# Patient Record
Sex: Male | Born: 1950 | Race: White | Hispanic: No | Marital: Married | Smoking: Never smoker
Health system: Southern US, Community
[De-identification: ages and names within clinical notes are randomized; demographics above are authoritative.]

## PROBLEM LIST (undated history)

## (undated) DIAGNOSIS — I2510 Atherosclerotic heart disease of native coronary artery without angina pectoris: Secondary | ICD-10-CM

## (undated) DIAGNOSIS — G709 Myoneural disorder, unspecified: Secondary | ICD-10-CM

## (undated) DIAGNOSIS — J189 Pneumonia, unspecified organism: Secondary | ICD-10-CM

## (undated) DIAGNOSIS — R001 Bradycardia, unspecified: Secondary | ICD-10-CM

## (undated) DIAGNOSIS — R519 Headache, unspecified: Secondary | ICD-10-CM

## (undated) DIAGNOSIS — N200 Calculus of kidney: Secondary | ICD-10-CM

## (undated) DIAGNOSIS — G8929 Other chronic pain: Secondary | ICD-10-CM

## (undated) DIAGNOSIS — R51 Headache: Secondary | ICD-10-CM

## (undated) DIAGNOSIS — N400 Enlarged prostate without lower urinary tract symptoms: Secondary | ICD-10-CM

## (undated) DIAGNOSIS — G7289 Other specified myopathies: Secondary | ICD-10-CM

## (undated) DIAGNOSIS — I714 Abdominal aortic aneurysm, without rupture, unspecified: Secondary | ICD-10-CM

## (undated) DIAGNOSIS — M549 Dorsalgia, unspecified: Secondary | ICD-10-CM

## (undated) DIAGNOSIS — E119 Type 2 diabetes mellitus without complications: Secondary | ICD-10-CM

## (undated) DIAGNOSIS — I739 Peripheral vascular disease, unspecified: Secondary | ICD-10-CM

## (undated) DIAGNOSIS — E785 Hyperlipidemia, unspecified: Secondary | ICD-10-CM

## (undated) HISTORY — DX: Abdominal aortic aneurysm, without rupture, unspecified: I71.40

## (undated) HISTORY — PX: ANTERIOR CERVICAL DECOMP/DISCECTOMY FUSION: SHX1161

## (undated) HISTORY — PX: BACK SURGERY: SHX140

## (undated) HISTORY — DX: Peripheral vascular disease, unspecified: I73.9

## (undated) HISTORY — PX: SPINE SURGERY: SHX786

## (undated) HISTORY — DX: Hyperlipidemia, unspecified: E78.5

---
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
## 1982-10-11 HISTORY — PX: LUMBAR DISC SURGERY: SHX700

## 2011-12-16 DIAGNOSIS — H109 Unspecified conjunctivitis: Secondary | ICD-10-CM | POA: Diagnosis not present

## 2011-12-24 DIAGNOSIS — H109 Unspecified conjunctivitis: Secondary | ICD-10-CM | POA: Diagnosis not present

## 2012-12-01 DIAGNOSIS — H00029 Hordeolum internum unspecified eye, unspecified eyelid: Secondary | ICD-10-CM | POA: Diagnosis not present

## 2014-03-13 ENCOUNTER — Other Ambulatory Visit: Payer: Self-pay

## 2014-03-13 ENCOUNTER — Telehealth: Payer: Self-pay

## 2014-03-13 DIAGNOSIS — Z1211 Encounter for screening for malignant neoplasm of colon: Secondary | ICD-10-CM

## 2014-03-13 NOTE — Telephone Encounter (Signed)
Pt's wife called to get pt scheduled for screening colonoscopy. His last one was 10 plus years ago in [place] and was normal.  Dr. Oneida Alar did pt's wife Olin Hauser ) about a year ago.

## 2014-03-13 NOTE — Telephone Encounter (Signed)
Gastroenterology Pre-Procedure Review  Request Date: 03/13/2014 Requesting Physician: Pt's wife called to get him scheduled for his next one  PATIENT REVIEW QUESTIONS: The patient responded to the following health history questions as indicated:    1. Diabetes Melitis: no 2. Joint replacements in the past 12 months: no 3. Major health problems in the past 3 months: no 4. Has an artificial valve or MVP: no 5. Has a defibrillator: no 6. Has been advised in past to take antibiotics in advance of a procedure like teeth cleaning: no    MEDICATIONS & ALLERGIES:    Patient reports the following regarding taking any blood thinners:   Plavix? no Aspirin? no Coumadin? no  Patient confirms/reports the following medications:  No current outpatient prescriptions on file.   No current facility-administered medications for this visit.    Patient confirms/reports the following allergies:  Allergies  Allergen Reactions  . Penicillins     NOT SURE WHAT REACTION/ HAPPENED AS A CHILD    No orders of the defined types were placed in this encounter.    AUTHORIZATION INFORMATION Primary Insurance:   ID #:   Group #:  Pre-Cert / Auth required: Pre-Cert / Auth #:   Secondary Insurance:   ID #:   Group #:  Pre-Cert / Auth required: Pre-Cert / Auth #:   SCHEDULE INFORMATION: Procedure has been scheduled as follows:  Date:04/05/2014               Time:  10:30 AM Location: [facility] Short Stay  This Gastroenterology Pre-Precedure Review Form is being routed to the following provider(s): Barney Drain, MD

## 2014-03-13 NOTE — Telephone Encounter (Signed)
PREPOPIK-DRINK WATER TO KEEP URINE LIGHT YELLOW.  PT SHOULD DROP OFF RX 3 DAYS PRIOR TO PROCEDURE.  

## 2014-03-15 MED ORDER — SOD PICOSULFATE-MAG OX-CIT ACD 10-3.5-12 MG-GM-GM PO PACK: 1.0000 | PACK | ORAL | Status: DC

## 2014-03-15 NOTE — Telephone Encounter (Signed)
Rx sent to the pharmacy and instructions mailed to pt.  

## 2014-03-27 ENCOUNTER — Encounter (HOSPITAL_COMMUNITY): Payer: Self-pay | Admitting: Pharmacy Technician

## 2014-04-05 ENCOUNTER — Ambulatory Visit (HOSPITAL_COMMUNITY)
Admission: RE | Admit: 2014-04-05 | Discharge: 2014-04-05 | Disposition: A | Discharge status: Home / Self Care | Payer: Medicare Other | Source: Ambulatory Visit | Attending: Gastroenterology | Admitting: Gastroenterology

## 2014-04-05 ENCOUNTER — Encounter (HOSPITAL_COMMUNITY): Payer: Self-pay | Admitting: *Deleted

## 2014-04-05 ENCOUNTER — Encounter (HOSPITAL_COMMUNITY)
Admission: RE | Disposition: A | Discharge status: Home / Self Care | Payer: Self-pay | Source: Ambulatory Visit | Attending: Gastroenterology

## 2014-04-05 DIAGNOSIS — D126 Benign neoplasm of colon, unspecified: Secondary | ICD-10-CM | POA: Insufficient documentation

## 2014-04-05 DIAGNOSIS — Z1211 Encounter for screening for malignant neoplasm of colon: Secondary | ICD-10-CM | POA: Diagnosis not present

## 2014-04-05 DIAGNOSIS — K648 Other hemorrhoids: Secondary | ICD-10-CM | POA: Insufficient documentation

## 2014-04-05 HISTORY — DX: Bradycardia, unspecified: R00.1

## 2014-04-05 HISTORY — PX: COLONOSCOPY: SHX5424

## 2014-04-05 LAB — HM COLONOSCOPY: HM Colonoscopy: NORMAL

## 2014-04-05 SURGERY — COLONOSCOPY
Anesthesia: Moderate Sedation

## 2014-04-05 MED ORDER — MIDAZOLAM HCL 5 MG/5ML IJ SOLN
INTRAMUSCULAR | Status: AC
  Filled 2014-04-05: qty 10

## 2014-04-05 MED ORDER — SODIUM CHLORIDE 0.9 % IV SOLN
INTRAVENOUS | Status: DC
  Administered 2014-04-05: 10:00:00 via INTRAVENOUS

## 2014-04-05 MED ORDER — MEPERIDINE HCL 100 MG/ML IJ SOLN
INTRAMUSCULAR | Status: DC | PRN
  Administered 2014-04-05: 25 mg via INTRAVENOUS

## 2014-04-05 MED ORDER — ATROPINE SULFATE 1 MG/ML IJ SOLN
INTRAMUSCULAR | Status: AC
  Filled 2014-04-05: qty 1

## 2014-04-05 MED ORDER — MIDAZOLAM HCL 5 MG/5ML IJ SOLN
INTRAMUSCULAR | Status: DC | PRN
  Administered 2014-04-05 (×2): 2 mg via INTRAVENOUS

## 2014-04-05 MED ORDER — ATROPINE SULFATE 1 MG/ML IJ SOLN
INTRAMUSCULAR | Status: DC | PRN
  Administered 2014-04-05 (×2): .5 mg via INTRAVENOUS

## 2014-04-05 MED ORDER — STERILE WATER FOR IRRIGATION IR SOLN
Status: DC | PRN
  Administered 2014-04-05: 11:00:00

## 2014-04-05 MED ORDER — MEPERIDINE HCL 100 MG/ML IJ SOLN
INTRAMUSCULAR | Status: AC
  Filled 2014-04-05: qty 2

## 2014-04-05 NOTE — Op Note (Signed)
[facility] [address] [place], [address]   COLONOSCOPY PROCEDURE REPORT  PATIENT: Stanley Boyd, Stanley Boyd  MR#: [id number] BIRTHDATE: [birth year] , 62  yrs. old GENDER: Male ENDOSCOPIST: Barney Drain, MD REFERRED BY: PROCEDURE DATE:  04/05/2014 PROCEDURE:   Colonoscopy with snare polypectomy INDICATIONS:Average risk patient for colon cancer.  LAST  TCS 10 YRS AGO: NO POLYPS MEDICATIONS: Demerol 50 mg IV, Versed, Versed 4 mg IV, and Atropine 1 mg IV  DESCRIPTION OF PROCEDURE:    Physical exam was performed.  Informed consent was obtained from the patient after explaining the benefits, risks, and alternatives to procedure.  The patient was connected to monitor and placed in left lateral position. Continuous oxygen was provided by nasal cannula and IV medicine administered through an indwelling cannula.  After administration of sedation and rectal exam, the patients rectum was intubated and the EC-3890Li ([id number])  colonoscope was advanced under direct visualization to the cecum.  The scope was removed slowly by carefully examining the color, texture, anatomy, and integrity mucosa on the way out.  The patient was recovered in endoscopy and discharged home in satisfactory condition.    COLON FINDINGS: A sessile polyp measuring 6 mm in size was found at the hepatic flexure.  A polypectomy was performed using snare cautery.  , The colon mucosa was otherwise normal.  , and Small internal hemorrhoids were found.  PREP QUALITY: excellent. CECAL W/D TIME: 12 minutes  COMPLICATIONS: HR initially 49 and decreased to 35. ATROPINE 0. 5MG  IVx2 GIVEN. HR REMAINED > 55.  ENDOSCOPIC IMPRESSION: 1.   ONE COLON POLYP REMOVED 2.   Small internal hemorrhoids  RECOMMENDATIONS: Next colonoscopy in 5-10 years. FOLLOW A HIGH FIBER DIET. BIOPSY RESULTS SHOULD BE BACK IN 7-10 DAYS.       _______________________________ Lorrin MaisBarney Drain, MD 04/05/2014 4:58  PM

## 2014-04-05 NOTE — Discharge Instructions (Signed)
I gave you atropine to keep your heart rate up. You had 1 small polyp removed. You have internal hemorrhoids.   Next colonoscopy in 5-10 years.  FOLLOW A HIGH FIBER DIET. SEE INFO BELOW.  YOUR BIOPSY RESULTS SHOULD BE BACK IN 7-10 DAYS.  Colonoscopy Care After Read the instructions outlined below and refer to this sheet in the next week. These discharge instructions provide you with general information on caring for yourself after you leave the hospital. While your treatment has been planned according to the most current medical practices available, unavoidable complications occasionally occur. If you have any problems or questions after discharge, call DR. Aum Caggiano, [phone].  ACTIVITY  You may resume your regular activity, but move at a slower pace for the next 24 hours.   Take frequent rest periods for the next 24 hours.   Walking will help get rid of the air and reduce the bloated feeling in your belly (abdomen).   No driving for 24 hours (because of the medicine (anesthesia) used during the test).   You may shower.   Do not sign any important legal documents or operate any machinery for 24 hours (because of the anesthesia used during the test).    NUTRITION  Drink plenty of fluids.   You may resume your normal diet as instructed by your doctor.   Begin with a light meal and progress to your normal diet. Heavy or fried foods are harder to digest and may make you feel sick to your stomach (nauseated).   Avoid alcoholic beverages for 24 hours or as instructed.    MEDICATIONS  You may resume your normal medications.   WHAT YOU CAN EXPECT TODAY  Some feelings of bloating in the abdomen.   Passage of more gas than usual.   Spotting of blood in your stool or on the toilet paper  .  IF YOU HAD POLYPS REMOVED DURING THE COLONOSCOPY:  Eat a soft diet IF YOU HAVE NAUSEA, BLOATING, ABDOMINAL PAIN, OR VOMITING.    FINDING OUT THE RESULTS OF YOUR TEST Not all test  results are available during your visit. DR. Oneida Alar WILL CALL YOU WITHIN 7 DAYS OF YOUR PROCEDUE WITH YOUR RESULTS. Do not assume everything is normal if you have not heard from DR. Clydene Burack IN ONE WEEK, CALL HER OFFICE AT [phone].  SEEK IMMEDIATE MEDICAL ATTENTION AND CALL THE OFFICE: [phone] IF:  You have more than a spotting of blood in your stool.   Your belly is swollen (abdominal distention).   You are nauseated or vomiting.   You have a temperature over 101F.   You have abdominal pain or discomfort that is severe or gets worse throughout the day.   High-Fiber Diet A high-fiber diet changes your normal diet to include more whole grains, legumes, fruits, and vegetables. Changes in the diet involve replacing refined carbohydrates with unrefined foods. The calorie level of the diet is essentially unchanged. The Dietary Reference Intake (recommended amount) for adult males is 38 grams per day. For adult females, it is 25 grams per day. Pregnant and lactating women should consume 28 grams of fiber per day. Fiber is the intact part of a plant that is not broken down during digestion. Functional fiber is fiber that has been isolated from the plant to provide a beneficial effect in the body. PURPOSE  Increase stool bulk.   Ease and regulate bowel movements.   Lower cholesterol.  INDICATIONS THAT YOU NEED MORE FIBER  Constipation and hemorrhoids.  Uncomplicated diverticulosis (intestine condition) and irritable bowel syndrome.   Weight management.   As a protective measure against hardening of the arteries (atherosclerosis), diabetes, and cancer.   GUIDELINES FOR INCREASING FIBER IN THE DIET  Start adding fiber to the diet slowly. A gradual increase of about 5 more grams (2 slices of whole-wheat bread, 2 servings of most fruits or vegetables, or 1 bowl of high-fiber cereal) per day is best. Too rapid an increase in fiber may result in constipation, flatulence, and bloating.    Drink enough water and fluids to keep your urine clear or pale yellow. Water, juice, or caffeine-free drinks are recommended. Not drinking enough fluid may cause constipation.   Eat a variety of high-fiber foods rather than one type of fiber.   Try to increase your intake of fiber through using high-fiber foods rather than fiber pills or supplements that contain small amounts of fiber.   The goal is to change the types of food eaten. Do not supplement your present diet with high-fiber foods, but replace foods in your present diet.  INCLUDE A VARIETY OF FIBER SOURCES  Replace refined and processed grains with whole grains, canned fruits with fresh fruits, and incorporate other fiber sources. White rice, white breads, and most bakery goods contain little or no fiber.   Brown whole-grain rice, buckwheat oats, and many fruits and vegetables are all good sources of fiber. These include: broccoli, Brussels sprouts, cabbage, cauliflower, beets, sweet potatoes, white potatoes (skin on), carrots, tomatoes, eggplant, squash, berries, fresh fruits, and dried fruits.   Cereals appear to be the richest source of fiber. Cereal fiber is found in whole grains and bran. Bran is the fiber-rich outer coat of cereal grain, which is largely removed in refining. In whole-grain cereals, the bran remains. In breakfast cereals, the largest amount of fiber is found in those with "bran" in their names. The fiber content is sometimes indicated on the label.   You may need to include additional fruits and vegetables each day.   In baking, for 1 cup white flour, you may use the following substitutions:   1 cup whole-wheat flour minus 2 tablespoons.   1/2 cup white flour plus 1/2 cup whole-wheat flour.   Polyps, Colon  A polyp is extra tissue that grows inside your body. Colon polyps grow in the large intestine. The large intestine, also called the colon, is part of your digestive system. It is a long, hollow tube at  the end of your digestive tract where your body makes and stores stool. Most polyps are not dangerous. They are benign. This means they are not cancerous. But over time, some types of polyps can turn into cancer. Polyps that are smaller than a pea are usually not harmful. But larger polyps could someday become or may already be cancerous. To be safe, doctors remove all polyps and test them.   WHO GETS POLYPS? Anyone can get polyps, but certain people are more likely than others. You may have a greater chance of getting polyps if:  You are over 50.   You have had polyps before.   Someone in your family has had polyps.   Someone in your family has had cancer of the large intestine.   Find out if someone in your family has had polyps. You may also be more likely to get polyps if you:   Eat a lot of fatty foods   Smoke   Drink alcohol   Do not exercise  Eat too  much   TREATMENT  The caregiver will remove the polyp during sigmoidoscopy or colonoscopy.    PREVENTION There is not one sure way to prevent polyps. You might be able to lower your risk of getting them if you:  Eat more fruits and vegetables and less fatty food.   Do not smoke.   Avoid alcohol.   Exercise every day.   Lose weight if you are overweight.   Eating more calcium and folate can also lower your risk of getting polyps. Some foods that are rich in calcium are milk, cheese, and broccoli. Some foods that are rich in folate are chickpeas, kidney beans, and spinach.   Hemorrhoids Hemorrhoids are dilated (enlarged) veins around the rectum. Sometimes clots will form in the veins. This makes them swollen and painful. These are called thrombosed hemorrhoids. Causes of hemorrhoids include:  Constipation.   Straining to have a bowel movement.   HEAVY LIFTING HOME CARE INSTRUCTIONS  Eat a well balanced diet and drink 6 to 8 glasses of water every day to avoid constipation. You may also use a bulk laxative.    Avoid straining to have bowel movements.   Keep anal area dry and clean.   Do not use a donut shaped pillow or sit on the toilet for long periods. This increases blood pooling and pain.   Move your bowels when your body has the urge; this will require less straining and will decrease pain and pressure.

## 2014-04-05 NOTE — OR Nursing (Signed)
Pt heart rate started at 46 pre procedure, atropine given heart rate increased to 64

## 2014-04-05 NOTE — H&P (Signed)
  Primary Care Physician:  Pcp Not In System Primary Gastroenterologist:  Dr. Oneida Alar  Pre-Procedure History & Physical: HPI:  Stanley Boyd is a 63 y.o. male here for COLON CANCER SCREENING.  History reviewed. No pertinent past medical history.  Past Surgical History  Procedure Laterality Date  . Cervical disc surgery  first in  1986, last in 2000    x4, [facility]    Prior to Admission medications   Not on File    Allergies as of 03/13/2014 - Review Complete 03/13/2014  Allergen Reaction Noted  . Penicillins  03/13/2014    History reviewed. No pertinent family history.  History   Social History  . Marital Status: Married    Spouse Name: N/A    Number of Children: N/A  . Years of Education: N/A   Occupational History  . Not on file.   Social History Main Topics  . Smoking status: Never Smoker   . Smokeless tobacco: Not on file  . Alcohol Use: No  . Drug Use: No  . Sexual Activity: Not on file   Other Topics Concern  . Not on file   Social History Narrative  . No narrative on file    Review of Systems: See HPI, otherwise negative ROS   Physical Exam: BP 127/77  Pulse 46  Temp(Src) 97.7 F (36.5 C) (Oral)  Ht 5\' 9"  (1.753 m)  Wt 175 lb (79.379 kg)  BMI 25.83 kg/m2  SpO2 95% General:   Alert,  pleasant and cooperative in NAD Head:  Normocephalic and atraumatic. Neck:  Supple; Lungs:  Clear throughout to auscultation.    Heart:  Regular rate and rhythm. Abdomen:  Soft, nontender and nondistended. Normal bowel sounds, without guarding, and without rebound.   Neurologic:  Alert and  oriented x4;  grossly normal neurologically.  Impression/Plan:     SCREENING  Plan:  1. TCS TODAY

## 2014-04-08 ENCOUNTER — Encounter (HOSPITAL_COMMUNITY): Payer: Self-pay | Admitting: Gastroenterology

## 2014-04-22 DIAGNOSIS — R3911 Hesitancy of micturition: Secondary | ICD-10-CM | POA: Diagnosis not present

## 2014-04-24 ENCOUNTER — Telehealth: Payer: Self-pay | Admitting: Gastroenterology

## 2014-04-24 NOTE — Telephone Encounter (Signed)
Please call pt. He had A simple adenoma removed from hIS colon.    Next colonoscopy in 5-10 years.   FOLLOW A HIGH FIBER DIET.

## 2014-04-26 NOTE — Telephone Encounter (Signed)
Called and informed pt.  

## 2014-08-11 DIAGNOSIS — J189 Pneumonia, unspecified organism: Secondary | ICD-10-CM

## 2014-08-11 DIAGNOSIS — Z23 Encounter for immunization: Secondary | ICD-10-CM | POA: Diagnosis not present

## 2014-08-11 HISTORY — DX: Pneumonia, unspecified organism: J18.9

## 2014-08-31 DIAGNOSIS — E785 Hyperlipidemia, unspecified: Secondary | ICD-10-CM | POA: Diagnosis not present

## 2014-08-31 DIAGNOSIS — J159 Unspecified bacterial pneumonia: Secondary | ICD-10-CM | POA: Diagnosis not present

## 2014-08-31 DIAGNOSIS — I361 Nonrheumatic tricuspid (valve) insufficiency: Secondary | ICD-10-CM | POA: Diagnosis not present

## 2014-08-31 DIAGNOSIS — E876 Hypokalemia: Secondary | ICD-10-CM | POA: Diagnosis present

## 2014-08-31 DIAGNOSIS — R339 Retention of urine, unspecified: Secondary | ICD-10-CM | POA: Diagnosis not present

## 2014-08-31 DIAGNOSIS — R748 Abnormal levels of other serum enzymes: Secondary | ICD-10-CM | POA: Diagnosis not present

## 2014-08-31 DIAGNOSIS — J90 Pleural effusion, not elsewhere classified: Secondary | ICD-10-CM | POA: Diagnosis not present

## 2014-08-31 DIAGNOSIS — R0689 Other abnormalities of breathing: Secondary | ICD-10-CM | POA: Diagnosis not present

## 2014-08-31 DIAGNOSIS — I340 Nonrheumatic mitral (valve) insufficiency: Secondary | ICD-10-CM | POA: Diagnosis not present

## 2014-08-31 DIAGNOSIS — F411 Generalized anxiety disorder: Secondary | ICD-10-CM | POA: Diagnosis present

## 2014-08-31 DIAGNOSIS — R509 Fever, unspecified: Secondary | ICD-10-CM | POA: Diagnosis not present

## 2014-08-31 DIAGNOSIS — A419 Sepsis, unspecified organism: Secondary | ICD-10-CM | POA: Diagnosis not present

## 2014-08-31 DIAGNOSIS — J181 Lobar pneumonia, unspecified organism: Secondary | ICD-10-CM | POA: Diagnosis not present

## 2014-08-31 DIAGNOSIS — I517 Cardiomegaly: Secondary | ICD-10-CM | POA: Diagnosis not present

## 2014-08-31 DIAGNOSIS — R740 Nonspecific elevation of levels of transaminase and lactic acid dehydrogenase [LDH]: Secondary | ICD-10-CM | POA: Diagnosis not present

## 2014-08-31 DIAGNOSIS — J918 Pleural effusion in other conditions classified elsewhere: Secondary | ICD-10-CM | POA: Diagnosis not present

## 2014-08-31 DIAGNOSIS — E860 Dehydration: Secondary | ICD-10-CM | POA: Diagnosis present

## 2014-08-31 DIAGNOSIS — R410 Disorientation, unspecified: Secondary | ICD-10-CM | POA: Diagnosis present

## 2014-08-31 DIAGNOSIS — J189 Pneumonia, unspecified organism: Secondary | ICD-10-CM | POA: Diagnosis present

## 2014-08-31 DIAGNOSIS — R945 Abnormal results of liver function studies: Secondary | ICD-10-CM | POA: Diagnosis not present

## 2014-09-06 LAB — BASIC METABOLIC PANEL
BUN: 15 mg/dL (ref 4–21)
Creatinine: 0.9 mg/dL (ref <=1.3)
Glucose: 117 mg/dL
Potassium: 4.3 mmol/L (ref 3.4–5.3)
Sodium: 135 mmol/L — AB (ref 137–147)

## 2014-09-06 LAB — POCT INR: INR: 1.1 (ref <=1.1)

## 2014-09-06 LAB — LIPID PANEL
Cholesterol: 186 mg/dL (ref 0–200)
HDL: 26 mg/dL — AB (ref 35–70)
LDL Cholesterol: 130 mg/dL
LDl/HDL Ratio: 130
Triglycerides: 149 mg/dL (ref 40–160)

## 2014-09-06 LAB — HEPATIC FUNCTION PANEL
ALT: 82 U/L — AB (ref 10–40)
AST: 78 U/L — AB (ref 14–40)
Alkaline Phosphatase: 162 U/L — AB (ref 25–125)
Bilirubin, Total: 0.7 mg/dL

## 2014-09-06 LAB — PROTIME-INR: Protime: 12 seconds (ref 10.0–13.8)

## 2014-09-09 ENCOUNTER — Telehealth: Payer: Self-pay

## 2014-09-09 NOTE — Telephone Encounter (Signed)
Pt has been scheduled.  °

## 2014-09-09 NOTE — Telephone Encounter (Signed)
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 . Pt was dc'd from hospital on Friday and needs fup.  Relayed that you would get him in to see you. pls advise on when to schedule.

## 2014-09-09 NOTE — Telephone Encounter (Signed)
Lm on vm for tp to cb

## 2014-09-09 NOTE — Telephone Encounter (Signed)
May combine 2 15 minute visits within next 2 weeks to create space for visit. If there are 5 30 minute visits, there should only be 2 other spots. 8 visits per half day if there are 4 30 minute visits

## 2014-09-12 DIAGNOSIS — R339 Retention of urine, unspecified: Secondary | ICD-10-CM | POA: Diagnosis not present

## 2014-09-12 DIAGNOSIS — R351 Nocturia: Secondary | ICD-10-CM | POA: Diagnosis not present

## 2014-09-12 DIAGNOSIS — N319 Neuromuscular dysfunction of bladder, unspecified: Secondary | ICD-10-CM | POA: Diagnosis not present

## 2014-09-13 ENCOUNTER — Encounter (HOSPITAL_COMMUNITY): Payer: Self-pay

## 2014-09-13 ENCOUNTER — Emergency Department (HOSPITAL_COMMUNITY)
Admission: EM | Admit: 2014-09-13 | Discharge: 2014-09-13 | Disposition: A | Discharge status: Home / Self Care | Payer: Medicare Other | Attending: Emergency Medicine | Admitting: Emergency Medicine

## 2014-09-13 DIAGNOSIS — Z9889 Other specified postprocedural states: Secondary | ICD-10-CM | POA: Diagnosis not present

## 2014-09-13 DIAGNOSIS — Z880 Allergy status to penicillin: Secondary | ICD-10-CM | POA: Diagnosis not present

## 2014-09-13 DIAGNOSIS — Z792 Long term (current) use of antibiotics: Secondary | ICD-10-CM | POA: Diagnosis not present

## 2014-09-13 DIAGNOSIS — R338 Other retention of urine: Secondary | ICD-10-CM | POA: Diagnosis not present

## 2014-09-13 DIAGNOSIS — Z8701 Personal history of pneumonia (recurrent): Secondary | ICD-10-CM | POA: Diagnosis not present

## 2014-09-13 DIAGNOSIS — R339 Retention of urine, unspecified: Secondary | ICD-10-CM | POA: Diagnosis not present

## 2014-09-13 MED ORDER — TAMSULOSIN HCL 0.4 MG PO CAPS
0.4000 mg | ORAL_CAPSULE | Freq: Every day | ORAL | Status: DC
Start: 2014-09-13 — End: 2015-06-26

## 2014-09-13 NOTE — ED Notes (Signed)
Pt was discharge with 54F Foley Catheter,  Output bag changed to leg bag, pt teaching given for foley care. Pt unable to sign discharge due to downtime of Epic system.

## 2014-09-13 NOTE — Discharge Instructions (Signed)
Acute Urinary Retention °Acute urinary retention is the temporary inability to urinate. °This is a common problem in older men. As men age their prostates become larger and block the flow of urine from the bladder. This is usually a problem that has come on gradually.  °HOME CARE INSTRUCTIONS °If you are sent home with a Foley catheter and a drainage system, you will need to discuss the best course of action with your health care provider. While the catheter is in, maintain a good intake of fluids. Keep the drainage bag emptied and lower than your catheter. This is so that contaminated urine will not flow back into your bladder, which could lead to a urinary tract infection. °There are two main types of drainage bags. One is a large bag that usually is used at night. It has a good capacity that will allow you to sleep through the night without having to empty it. The second type is called a leg bag. It has a smaller capacity, so it needs to be emptied more frequently. However, the main advantage is that it can be attached by a leg strap and can go underneath your clothing, allowing you the freedom to move about or leave your home. °Only take over-the-counter or prescription medicines for pain, discomfort, or fever as directed by your health care provider.  °SEEK MEDICAL CARE IF: °· You develop a low-grade fever. °· You experience spasms or leakage of urine with the spasms. °SEEK IMMEDIATE MEDICAL CARE IF:  °· You develop chills or fever. °· Your catheter stops draining urine. °· Your catheter falls out. °· You start to develop increased bleeding that does not respond to rest and increased fluid intake. °MAKE SURE YOU: °· Understand these instructions. °· Will watch your condition. °· Will get help right away if you are not doing well or get worse. °Document Released: 01/03/2001 Document Revised: 10/02/2013 Document Reviewed: 03/08/2013 °Exit[employer]® Patient Information ©2015 [employer]. This information is not  intended to replace advice given to you by your health care provider. Make sure you discuss any questions you have with your health care provider. ° °

## 2014-09-13 NOTE — ED Notes (Signed)
Pt released from [facility] for Pnuemonia, states he had a urinary cath in place that was removed at discharge.  Pt states he has not voided since Midnight.

## 2014-09-13 NOTE — ED Provider Notes (Signed)
CSN: [id number]     Arrival date & time 09/13/14  0459 History   First MD Initiated Contact with Patient 09/13/14 [id number]     Chief Complaint - Urinary Retention    The history is provided by the patient and a significant other.  Patient presents with acute urinary retention It started over 5 hours ago and it is rapidly worsening.  Nothing improves his retention.  Nothing worsens his retention.  He reports associated abdominal pain.  He has not taken anything for his pain  Patient reports he was recently inpatient in [facility] for pneumonia.  He was discharged home with foley catheter due to urinary retention.  He saw urologist yesterday as outpatient and had foley removed.  He was not started on flomax. However during the night he noted had acute onset of retention  He is on 2 antibiotics for his recent pneumonia He does not take any meds for BPH  Past Medical History  Diagnosis Date  . Bradycardia    Past Surgical History  Procedure Laterality Date  . Cervical disc surgery  first in  1986, last in 2000    x4, [facility]  . Colonoscopy N/A 04/05/2014    Procedure: COLONOSCOPY;  Surgeon: Danie Binder, MD;  Location: [facility] ENDO SUITE;  Service: Endoscopy;  Laterality: N/A;  10:30 AM   No family history on file. History  Substance Use Topics  . Smoking status: Never Smoker   . Smokeless tobacco: Not on file  . Alcohol Use: No    Review of Systems  Constitutional: Negative for fever.  Genitourinary: Positive for difficulty urinating.  Musculoskeletal: Negative for back pain.  All other systems reviewed and are negative.     Allergies  Penicillins  Home Medications   Prior to Admission medications   Not on File   BP 129/89 mmHg  Pulse 77  Temp(Src) 97.9 F (36.6 C) (Oral)  Resp 20  Ht 5\' 9"  (1.753 m)  Wt 176 lb (79.833 kg)  BMI 25.98 kg/m2  SpO2 98% Physical Exam CONSTITUTIONAL: Well developed/well nourished, uncomfortable appearing HEAD:  Normocephalic/atraumatic EYES: EOMI ENMT: Mucous membranes moist NECK: supple no meningeal signs CV: S1/S2 noted, no murmurs/rubs/gallops noted LUNGS: Lungs are clear to auscultation bilaterally, no apparent distress ABDOMEN: soft, suprapubic tenderness noted, no rebound or guarding, bowel sounds noted throughout abdomen NEURO: Pt is awake/alert/appropriate, moves all extremitiesx4.  No facial droop.   EXTREMITIES: pulses normal/equal, full ROM SKIN: warm, color normal PSYCH: no abnormalities of mood noted, alert and oriented to situation  ED Course  Procedures  Labs Review Labs Reviewed  URINE CULTURE     MDM  5:40 AM Pt improving after foley placement by nursing Apparently he had foley removed yesterday by urology and now has recurrence of retention Will d/c home with foley leg bag and will give Rx for flomax and advised for him to call his urologist later this morning.  Will defer any new antibiotics until culture returns as he was just on two antibiotics after hospitalization 6:17 AM Pt feels improved He does not have any suprapubic tenderness at his time He would like to go home He will be discharged with catheter in place  He will call his urologist today Final diagnoses:  Acute urinary retention    Nursing notes including past medical history and social history reviewed and considered in documentation     Sharyon Cable, MD 09/13/14 [phone]

## 2014-09-15 LAB — URINE CULTURE
Colony Count: NO GROWTH
Culture: NO GROWTH
Special Requests: NORMAL

## 2014-09-18 ENCOUNTER — Encounter: Payer: Self-pay | Admitting: Family Medicine

## 2014-09-18 ENCOUNTER — Ambulatory Visit (INDEPENDENT_AMBULATORY_CARE_PROVIDER_SITE_OTHER): Payer: Medicare Other | Admitting: Family Medicine

## 2014-09-18 VITALS — BP 98/72 | HR 53 | Temp 97.5°F | Ht 69.0 in | Wt 185.0 lb

## 2014-09-18 DIAGNOSIS — R339 Retention of urine, unspecified: Secondary | ICD-10-CM

## 2014-09-18 DIAGNOSIS — G8190 Hemiplegia, unspecified affecting unspecified side: Secondary | ICD-10-CM

## 2014-09-18 DIAGNOSIS — J189 Pneumonia, unspecified organism: Secondary | ICD-10-CM | POA: Diagnosis not present

## 2014-09-18 DIAGNOSIS — E785 Hyperlipidemia, unspecified: Secondary | ICD-10-CM

## 2014-09-18 DIAGNOSIS — R740 Nonspecific elevation of levels of transaminase and lactic acid dehydrogenase [LDH]: Secondary | ICD-10-CM | POA: Diagnosis not present

## 2014-09-18 DIAGNOSIS — N400 Enlarged prostate without lower urinary tract symptoms: Secondary | ICD-10-CM | POA: Insufficient documentation

## 2014-09-18 DIAGNOSIS — R001 Bradycardia, unspecified: Secondary | ICD-10-CM | POA: Diagnosis not present

## 2014-09-18 DIAGNOSIS — R7401 Elevation of levels of liver transaminase levels: Secondary | ICD-10-CM

## 2014-09-18 NOTE — Assessment & Plan Note (Addendum)
Resolved after what appears to be a 14 day course of antibiotics (or at least 10 days of vanc/linezolid). During hospitalization, thought to be possible MRSA pneumonia post influenza but this is a community acquired pneumonia case and flu was negative (though appears to have been tested 2 days after hospitalized). Possible resistant strep pneumo case. Patient has pulmonology follow up with plans to repeat CT scan to follow dense consolidation (6 weeks after diagnosis). I have scanned in hospital report including CT scan after review today.   Follow up with me in 6 weeks or sooner if any recurrence of symptoms.

## 2014-09-18 NOTE — Patient Instructions (Addendum)
Records from Dr. Gladstone Lighter and [facility] gastroenterology You already requested from hospital.   Glad you have improved from this pneumonia. Keep using the albuterol as needed and hope its use continues to slow.   Follow up with urology-make sure they send [place] notes here.   Follow up with pulmonology- I can see their notes.   Obviously if you have fever, shortness of breath, worsening fatiuge, see me sooner but otherwise check in 6 weeks from now.   Get a morning appointment and I will review your records/labs to see if you need any further bloodwork.

## 2014-09-18 NOTE — Assessment & Plan Note (Addendum)
Foley catheter in place. Has follow up with urology. On flomax. I question whether there is some neurological damage as a result of his fall/injury many years ago as this seems to happen in periods of stress and patient also seems mildly hyperreflexic but this may simply just be post operative difficulty voiding.

## 2014-09-18 NOTE — Assessment & Plan Note (Signed)
Obtain records. Consider fasting labs at next visit to establish baseline next visit.

## 2014-09-18 NOTE — Progress Notes (Addendum)
Stanley Reddish, MD Phone: [phone]  Subjective:  Patient presents today to establish care and for hospital follow up and to establish care. Former physician was Dr. Gladstone Lighter (7-8 years ago). Chief complaint-noted.   Right sided Community Acquired Pneumonia  Per patient and discharge paperwork Hospitalized November 21st to the 27th at [facility] with community acquired pneumonia. Had hacking cough week before then low grade fever for 2-3 days before hospitalization. Fever Spiked on Saturday before hospitalization above 103. Was never short of breath.   Patient also completed of severe headaches and had negative head CT. Continued fever for 4 days in the hospital ( and then changed to vancomycin which broke the fever. Had a right sided parapnumonic effusion secondary to pneumonia. Unclear other medications used during hospitalization. Per discharge patient afebrile 48 hours before discharge. CAP improved clinically and radiographic. 6 week pulmonology follow up. Patient was later scheduled for planned follow up with [facility]. No bacteremia on cultures viewable on discharge papers. Day of discharge no leukocytosis, hgb 13.7, plat 290. Cr 0.9, sodium 135, glucose 117, albumin 3.1. Strep pna antigen negative. Urine legionaeela negative.   Family and patient deny being told he was septic. They state it is Normal for him to run close to 100/70 or slightly under. No lightheadedness with standing. HR normally runs in 40s. 50s actually high for him.   Also had urinary retention during hospitalization (day 3 of hospitalization stopped urinating). Had foley when discharged then went to urology on 12/4 and was removed but had urinary retention and had to go to ER. Foley placed again and flomax started. Urine culture negative in hospital. Has upcoming repeat urology follow up.   At home, patient has been doing albuterol 2-3 x  A day if hacking cough but has been improving.  Discharged on lenezolid for staph and levaquin. 7 days of antibiotics at home after already having 6-7 days in hospital. No trouble with stooling-normal bowel movements. After major surgery in 200, had difficulty with urinary retention and difficulty controlling stools. Reports having already had flu shot this year.   ROS- denies shortness of breath, still with occasional hacking cough. No fevers/chills. Appetite increasing. Weakness as noted under surgical history and decreased sensation. Otherwise, full ROS completed and negative.   The following were reviewed and entered/updated in epic: Past Medical History  Diagnosis Date  . Bradycardia     40s to low 50s  . Hyperlipidemia   Polyp in September. [place] [facility]  Patient Active Problem List   Diagnosis Date Noted  . Hemiparesis 09/18/2014  . CAP (community acquired pneumonia) 09/18/2014  . Urinary retention 09/18/2014  . Transaminitis 09/18/2014  . Hyperlipidemia   . Bradycardia    Past Surgical History  Procedure Laterality Date  . Cervical disc surgery  first in  1986, last in 2000    x3, [facility]  . Colonoscopy N/A 04/05/2014    Procedure: COLONOSCOPY;  Surgeon: Danie Binder, MD;  Location: [facility];  Service: Endoscopy;  Laterality: N/A;  10:30 AM  . Low back surgery    1 lower back surgery when working, cervical disc surgery x 2 with follow up 1 year later. 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 Surgery the next day in 2000-cervical surgery. At baseline with weakness on right sie of body. Decreased sensation in right side of body. Cannot tell hot/cold on right side of body. Decreased strength on right. When brought home  from hospitalization in 2000, urinary retention and difficulty controlling stools.    Family History  Problem Relation Age of Onset  . Alzheimer's disease Mother   . Colon cancer Other     grandmother    Medications- reviewed and  updated Current Outpatient Prescriptions  Medication Sig Dispense Refill  . tamsulosin (FLOMAX) 0.4 MG CAPS capsule Take 1 capsule (0.4 mg total) by mouth daily after supper. 14 capsule 0   No current facility-administered medications for this visit.  albuterol prn through nebulizer  Allergies-reviewed and updated Allergies  Allergen Reactions  . Penicillins     NOT SURE WHAT REACTION/ HAPPENED AS A CHILD    History   Social History  . Marital Status: Married    Spouse Name: N/A    Number of Children: N/A  . Years of Education: N/A   Social History Main Topics  . Smoking status: Never Smoker   . Smokeless tobacco: None  . Alcohol Use: No  . Drug Use: No  . Sexual Activity: None   Other Topics Concern  . None   Social History Narrative   Married. 2 children Michael Boston [place], DO of [place])      Retired/disability after accident in 2000.       Hobbies: hunting, caring for grandkids, collecting baseball cards    ROS--See HPI , otherwise full ROS was completed and negative except as noted above  Objective: BP 98/72 mmHg  Pulse 53  Temp(Src) 97.5 F (36.4 C)  Ht 5\' 9"  (1.753 m)  Wt 185 lb (83.915 kg)  BMI 27.31 kg/m2 Gen: NAD, resting comfortably in chair HEENT: Mucous membranes are moist. Oropharynx normal. Some decay in dentition.  Eyes: lids and sclera normal, PERRLA Neck: no thyromegaly CV: bradycardic, regular rhythm no murmurs rubs or gallops Lungs: right lung base with mild decreased breath sounds compared to left otherwise no crackles, wheeze, rhonchi Abdomen: soft/nontender/nondistended/normal bowel sounds. No rebound or guarding.  Ext: no edema Skin: warm, dry, no rash  Neuro: 4+/5 strength  Throughout right side, normal strength left side,no facial asymmetry, reflexes at knees 3+ and and bicep.   Assessment/Plan:  Bradycardia Obtain records and need baseline EKG. Chronic issue but seems asymptomatic. Consider ekg if cannot find in records.    CAP (community acquired pneumonia) Resolved after what appears to be a 14 day course of antibiotics (or at least 10 days of vanc/linezolid). During hospitalization, thought to be possible MRSA pneumonia post influenza but this is a community acquired pneumonia case and flu was negative (though appears to have been tested 2 days after hospitalized). Possible resistant strep pneumo case. Patient has pulmonology follow up with plans to repeat CT scan to follow dense consolidation (6 weeks after diagnosis). I have scanned in hospital report including CT scan after review today.   Follow up with me in 6 weeks or sooner if any recurrence of symptoms.   Hyperlipidemia Obtain records. Consider fasting labs at next visit to establish baseline next visit.   Urinary retention Foley catheter in place. Has follow up with urology. On flomax. I question whether there is some neurological damage as a result of his fall/injury many years ago as this seems to happen in periods of stress and patient also seems mildly hyperreflexic but this may simply just be post operative difficulty voiding.   Transaminitis Discovered after patient left through discharge summary. We will plan on repeating LFTs at follow up.    Obtain records from [facility], [facility]  for last colonoscopy and prior PCP.Bring immunizations from health department.    After visit obtained records from [place]:  Additional or summarized information from HPI: Admitted 08/31/14 with fever and chills and started on rocephin, azithromycin. Mild hypoxia with 2L NS started as result. Mild AST/ALT elevation.  Head CT on 08/31/14 without acute intracranial findings  Pulmonary consult 09/03/14. Mentions changed to Levaquin/sozyn due to persistent fevers. Chest CT showed dense consolidation in RLL consistent with pulmonic infiltrate as well as pleural based density in right posterior lower lobe which may be second infiltrate with  tiny small right pleural effusion. Hospital notes mention SOB even on 3L [place] with walking. He was also treated with Tamiflu. Pulmonary mentioned sepsis secondary to RLL PNA and hypoxic respiratory insufficiency and small right pleural effusion. Pulm recommended 6 week follow up Chest CT to evaluate for possible pleural based mass.   Discharge summary-negative for flu. Mycoplasma pneumonia igg and igm were pending at time of discharge. Discharged with levaquin and linezolid for possible post-influenza staph pneumonia. Tamiflu x 5 days.  Right sided pleural based density in RLL measuring 2.3 x 1.5 cm on CT possible PNA vs. Mass.  Sats at discharge 94% on RA with ambulating  Lipids added to overview  Blood cultures were negative.  During stay had echocardiogram with EF 60-65%, LVH, stated "probably normal diastolic function". Mild mitral regurg and mild tricuspid regurg. No evidence endocarditis.   EKG incomplete RBBB, rate 73 at admit

## 2014-09-18 NOTE — Assessment & Plan Note (Signed)
Obtain records and need baseline EKG. Chronic issue but seems asymptomatic. Consider ekg if cannot find in records.

## 2014-09-18 NOTE — Assessment & Plan Note (Signed)
Discovered after patient left through discharge summary. We will plan on repeating LFTs at follow up.

## 2014-09-20 DIAGNOSIS — R339 Retention of urine, unspecified: Secondary | ICD-10-CM | POA: Diagnosis not present

## 2014-09-20 DIAGNOSIS — N319 Neuromuscular dysfunction of bladder, unspecified: Secondary | ICD-10-CM | POA: Diagnosis not present

## 2014-09-20 DIAGNOSIS — R351 Nocturia: Secondary | ICD-10-CM | POA: Diagnosis not present

## 2014-09-26 ENCOUNTER — Encounter: Payer: Self-pay | Admitting: Family Medicine

## 2014-10-01 ENCOUNTER — Encounter: Payer: Self-pay | Admitting: Family Medicine

## 2014-10-01 NOTE — Progress Notes (Signed)
[facility] (prior PCP) records reviewed 12/18/03 as new patient-mentions elevated LFTs and GGT with plan to follow over time.  04/21/04 Tick bite with malaise/fatigue fever and treated with doxy x 10 days.  04/27/14 continued to folow LFTs 04/09/05 mentions asymptomatic bradycardia  Labs show normal LFTs with ast 28, alt 33, bili 0.3, alk phos 70.   Lipids total 231, hdl 47, trig 95, HDL 165 04/21/2004  LFTs alk phos 225, ast 46, alt 159, bili 1.1.

## 2014-10-02 ENCOUNTER — Encounter: Payer: Self-pay | Admitting: Family Medicine

## 2014-10-02 LAB — PSA: PSA, FREE: 2.1

## 2014-10-08 ENCOUNTER — Encounter: Payer: Self-pay | Admitting: Family Medicine

## 2014-10-15 ENCOUNTER — Encounter: Payer: Self-pay | Admitting: Family Medicine

## 2014-10-15 DIAGNOSIS — Z86010 Personal history of colonic polyps: Secondary | ICD-10-CM | POA: Insufficient documentation

## 2014-10-18 ENCOUNTER — Encounter: Payer: Self-pay | Admitting: Emergency Medicine

## 2014-10-18 ENCOUNTER — Ambulatory Visit (INDEPENDENT_AMBULATORY_CARE_PROVIDER_SITE_OTHER): Payer: Medicare Other | Admitting: Emergency Medicine

## 2014-10-18 ENCOUNTER — Encounter (INDEPENDENT_AMBULATORY_CARE_PROVIDER_SITE_OTHER): Payer: Self-pay

## 2014-10-18 VITALS — BP 122/64 | HR 50 | Temp 98.2°F | Ht 69.0 in | Wt 194.0 lb

## 2014-10-18 DIAGNOSIS — J189 Pneumonia, unspecified organism: Secondary | ICD-10-CM

## 2014-10-18 DIAGNOSIS — R938 Abnormal findings on diagnostic imaging of other specified body structures: Secondary | ICD-10-CM

## 2014-10-18 DIAGNOSIS — R9389 Abnormal findings on diagnostic imaging of other specified body structures: Secondary | ICD-10-CM

## 2014-10-18 NOTE — Progress Notes (Signed)
Subjective:    Patient ID: Stanley Boyd, male    DOB: 08/23/1951, 64 y.o.   MRN: [id number]  HPI 64 yo man, never smoker, hx of hyperlipidemia and bradycardia. Has been well until November 2015 when he had a severe URI prodrome and then a RLL PNA with associated parapneumonic effusion. The plan was for him to have a repeat CT chest to insure interval improvement. He is otherwise quite healthy. He never smoked and has no breathing trouble - no cough, wheeze, no DOE. He is able to hunt.    Review of Systems  Constitutional: Negative for fever and unexpected weight change.  HENT: Negative for congestion, dental problem, ear pain, nosebleeds, postnasal drip, rhinorrhea, sinus pressure, sneezing, sore throat and trouble swallowing.   Eyes: Negative for redness and itching.  Respiratory: Positive for shortness of breath. Negative for cough, chest tightness and wheezing.   Cardiovascular: Negative for palpitations and leg swelling.  Gastrointestinal: Negative for nausea and vomiting.  Genitourinary: Negative for dysuria.  Musculoskeletal: Negative for joint swelling.  Skin: Negative for rash.  Neurological: Negative for headaches.  Hematological: Does not bruise/bleed easily.  Psychiatric/Behavioral: Negative for dysphoric mood. The patient is not nervous/anxious.     Past Medical History  Diagnosis Date  . Bradycardia     40s to low 50s  . Hyperlipidemia      Family History  Problem Relation Age of Onset  . Alzheimer's disease Mother   . Colon cancer Other     grandmother     History   Social History  . Marital Status: Married    Spouse Name: N/A    Number of Children: N/A  . Years of Education: N/A   Occupational History  . Not on file.   Social History Main Topics  . Smoking status: Never Smoker   . Smokeless tobacco: Never Used  . Alcohol Use: No  . Drug Use: No  . Sexual Activity: Not on file   Other Topics Concern  . Not on file   Social History Narrative     Married. 2 children Michael Boston [place], DO of [place])      Retired/disability after accident in 2000.       Hobbies: hunting, caring for grandkids, collecting baseball cards   He worked in Mining engineer at [employer]  No other exposures.  No mold exposure, no pets, no military  Allergies  Allergen Reactions  . Asa [Aspirin]   . Penicillins     NOT SURE WHAT REACTION/ HAPPENED AS A CHILD     Outpatient Prescriptions Prior to Visit  Medication Sig Dispense Refill  . tamsulosin (FLOMAX) 0.4 MG CAPS capsule Take 1 capsule (0.4 mg total) by mouth daily after supper. 14 capsule 0   No facility-administered medications prior to visit.                                                                                                                   Objective:   Physical Exam Filed  Vitals:   10/18/14 1545 10/18/14 1548  BP:  122/64  Pulse:  50  Temp: 98.2 F (36.8 C)   TempSrc: Oral   Height: 5\' 9"  (1.753 m)   Weight: 194 lb (87.998 kg)   SpO2:  97%   Gen: Pleasant, well-nourished, in no distress,  normal affect  ENT: No lesions,  mouth clear,  oropharynx clear, no postnasal drip  Neck: No JVD, no TMG, no carotid bruits  Lungs: No use of accessory muscles, clear without rales or rhonchi  Cardiovascular: RRR, heart sounds normal, no murmur or gallops, no peripheral edema  Musculoskeletal: No deformities, no cyanosis or clubbing  Neuro: alert, non focal  Skin: Warm, no lesions or rashes      Assessment & Plan:  CAP (community acquired pneumonia) Records reviewed from [facility] showed that he had a severe right lower lobe pneumonia with an associated parapneumonic effusion and severe sepsis. He was treated initially with standard antibiotics without improvement and was then covered empirically for MRSA. He was sent home on a quinolone and Linezolid. He now feels completely better. He did not have any symptoms of underlying chronic lung  disease that would've predisposed him to a pneumonia. I believe at this time the most appropriate plan is to repeat his imaging to ensure that his right-sided opacity has improved. We will perform a CT scan of the chest and will obtain all imaging from [facility] for comparison. Follow-up next available to review

## 2014-10-18 NOTE — Patient Instructions (Signed)
We will perform a CT scan of her chest to assess your right lower lobe for improvement after treatment for your pneumonia We will obtain all of your imaging from [facility] Follow with Dr. Lamonte Sakai next available to review these studies

## 2014-10-18 NOTE — Assessment & Plan Note (Signed)
Records reviewed from [facility] showed that he had a severe right lower lobe pneumonia with an associated parapneumonic effusion and severe sepsis. He was treated initially with standard antibiotics without improvement and was then covered empirically for MRSA. He was sent home on a quinolone and Linezolid. He now feels completely better. He did not have any symptoms of underlying chronic lung disease that would've predisposed him to a pneumonia. I believe at this time the most appropriate plan is to repeat his imaging to ensure that his right-sided opacity has improved. We will perform a CT scan of the chest and will obtain all imaging from [facility] for comparison. Follow-up next available to review

## 2014-10-22 ENCOUNTER — Ambulatory Visit (INDEPENDENT_AMBULATORY_CARE_PROVIDER_SITE_OTHER): Payer: BLUE CROSS/BLUE SHIELD | Admitting: Emergency Medicine

## 2014-10-22 ENCOUNTER — Ambulatory Visit (INDEPENDENT_AMBULATORY_CARE_PROVIDER_SITE_OTHER)
Admission: RE | Admit: 2014-10-22 | Discharge: 2014-10-22 | Disposition: A | Discharge status: Home / Self Care | Payer: Medicare Other | Source: Ambulatory Visit | Attending: Emergency Medicine | Admitting: Emergency Medicine

## 2014-10-22 ENCOUNTER — Encounter: Payer: Self-pay | Admitting: Emergency Medicine

## 2014-10-22 VITALS — BP 130/72 | HR 58 | Temp 97.9°F | Ht 69.0 in | Wt 197.0 lb

## 2014-10-22 DIAGNOSIS — R938 Abnormal findings on diagnostic imaging of other specified body structures: Secondary | ICD-10-CM | POA: Diagnosis not present

## 2014-10-22 DIAGNOSIS — J189 Pneumonia, unspecified organism: Secondary | ICD-10-CM

## 2014-10-22 DIAGNOSIS — R9389 Abnormal findings on diagnostic imaging of other specified body structures: Secondary | ICD-10-CM

## 2014-10-22 MED ORDER — IOHEXOL 300 MG/ML  SOLN
80.0000 mL | Freq: Once | INTRAMUSCULAR | Status: AC | PRN
  Administered 2014-10-22: 80 mL via INTRAVENOUS

## 2014-10-22 NOTE — Assessment & Plan Note (Signed)
This is a small residual area of linear pleural scarring at the right base. This does not look suspicious for malignancy or abscess but we will follow with a repeat film in 1 year.  The right upper lobe pneumonia has fully resolved Follow in one year after the CT scan to review

## 2014-10-22 NOTE — Patient Instructions (Signed)
Your CT scan shows that your right upper lobe pneumonia has completely resolved There is a small area of scarring at the bottom of the right lung.  We will repeat your CT scan of the chest in one year. Follow with Dr. Lamonte Sakai in one year after the study to review.

## 2014-10-22 NOTE — Progress Notes (Signed)
Subjective:    Patient ID: Stanley Boyd, male    DOB: 24-Feb-1951, 64 y.o.   MRN: [id number]  HPI 64 yo man, never smoker, hx of hyperlipidemia and bradycardia. Has been well until November 2015 when he had a severe URI prodrome and then a RLL PNA with associated parapneumonic effusion. The plan was for him to have a repeat CT chest to insure interval improvement. He is otherwise quite healthy. He never smoked and has no breathing trouble - no cough, wheeze, no DOE. He is able to hunt.   ROV 10/22/14 -- Stanley Boyd follows to review his repeat CT scan chest. He has a severe RLL PNA with a parapneumonic effusion in 08/2015. He is feeling fine, at baseline. The repeat Ct scan shows a small linear area of apparent pleural scar at the R base. The RUL infiltrate is fully resolved.    Review of Systems  Constitutional: Negative for fever and unexpected weight change.  HENT: Negative for congestion, dental problem, ear pain, nosebleeds, postnasal drip, rhinorrhea, sinus pressure, sneezing, sore throat and trouble swallowing.   Eyes: Negative for redness and itching.  Respiratory: Positive for shortness of breath. Negative for cough, chest tightness and wheezing.   Cardiovascular: Negative for palpitations and leg swelling.  Gastrointestinal: Negative for nausea and vomiting.  Genitourinary: Negative for dysuria.  Musculoskeletal: Negative for joint swelling.  Skin: Negative for rash.  Neurological: Negative for headaches.  Hematological: Does not bruise/bleed easily.  Psychiatric/Behavioral: Negative for dysphoric mood. The patient is not nervous/anxious.     Past Medical History  Diagnosis Date  . Bradycardia     40s to low 50s  . Hyperlipidemia      Family History  Problem Relation Age of Onset  . Alzheimer's disease Mother   . Colon cancer Other     grandmother     History   Social History  . Marital Status: Married    Spouse Name: N/A    Number of Children: N/A  . Years of  Education: N/A   Occupational History  . Not on file.   Social History Main Topics  . Smoking status: Never Smoker   . Smokeless tobacco: Never Used  . Alcohol Use: No  . Drug Use: No  . Sexual Activity: Not on file   Other Topics Concern  . Not on file   Social History Narrative   Married. 2 children Stanley Boston [place], DO of [place])      Retired/disability after accident in 2000.       Hobbies: hunting, caring for grandkids, collecting baseball cards   He worked in Mining engineer at [employer]  No other exposures.  No mold exposure, no pets, no military  Allergies  Allergen Reactions  . Asa [Aspirin]   . Penicillins     NOT SURE WHAT REACTION/ HAPPENED AS A CHILD     Outpatient Prescriptions Prior to Visit  Medication Sig Dispense Refill  . tamsulosin (FLOMAX) 0.4 MG CAPS capsule Take 1 capsule (0.4 mg total) by mouth daily after supper. 14 capsule 0   No facility-administered medications prior to visit.  Objective:   Physical Exam Filed Vitals:   10/22/14 1543  BP: 130/72  Pulse: 58  Temp: 97.9 F (36.6 C)  TempSrc: Oral  Height: 5\' 9"  (1.753 m)  Weight: 197 lb (89.359 kg)  SpO2: 99%   Gen: Pleasant, well-nourished, in no distress,  normal affect  ENT: No lesions,  mouth clear,  oropharynx clear, no postnasal drip  Neck: No JVD, no TMG, no carotid bruits  Lungs: No use of accessory muscles, clear without rales or rhonchi  Cardiovascular: RRR, heart sounds normal, no murmur or gallops, no peripheral edema  Musculoskeletal: No deformities, no cyanosis or clubbing  Neuro: alert, non focal  Skin: Warm, no lesions or rashes      Assessment & Plan:  CAP (community acquired pneumonia) This is a small residual area of linear pleural scarring at the right base. This does not look suspicious for malignancy or abscess but we will  follow with a repeat film in 1 year.  The right upper lobe pneumonia has fully resolved Follow in one year after the CT scan to review

## 2014-10-23 ENCOUNTER — Encounter: Payer: Self-pay | Admitting: Family Medicine

## 2014-11-01 ENCOUNTER — Ambulatory Visit: Payer: Medicare Other | Admitting: Family Medicine

## 2014-11-07 ENCOUNTER — Ambulatory Visit (INDEPENDENT_AMBULATORY_CARE_PROVIDER_SITE_OTHER): Payer: Medicare Other | Admitting: Family Medicine

## 2014-11-07 ENCOUNTER — Encounter: Payer: Self-pay | Admitting: Family Medicine

## 2014-11-07 VITALS — BP 102/80 | Temp 97.9°F | Wt 198.0 lb

## 2014-11-07 DIAGNOSIS — R740 Nonspecific elevation of levels of transaminase and lactic acid dehydrogenase [LDH]: Secondary | ICD-10-CM | POA: Diagnosis not present

## 2014-11-07 DIAGNOSIS — N400 Enlarged prostate without lower urinary tract symptoms: Secondary | ICD-10-CM | POA: Diagnosis not present

## 2014-11-07 DIAGNOSIS — J189 Pneumonia, unspecified organism: Secondary | ICD-10-CM | POA: Diagnosis not present

## 2014-11-07 DIAGNOSIS — E785 Hyperlipidemia, unspecified: Secondary | ICD-10-CM

## 2014-11-07 DIAGNOSIS — R7401 Elevation of levels of liver transaminase levels: Secondary | ICD-10-CM

## 2014-11-07 LAB — CBC
HCT: 43.8 % (ref 39.0–52.0)
Hemoglobin: 14.9 g/dL (ref 13.0–17.0)
MCHC: 33.9 g/dL (ref 30.0–36.0)
MCV: 90.3 fl (ref 78.0–100.0)
Platelets: 234 10*3/uL (ref 150.0–400.0)
RBC: 4.86 Mil/uL (ref 4.22–5.81)
RDW: 14 % (ref 11.5–15.5)
WBC: 5.7 10*3/uL (ref 4.0–10.5)

## 2014-11-07 LAB — COMPREHENSIVE METABOLIC PANEL
ALT: 19 U/L (ref 0–53)
AST: 20 U/L (ref 0–37)
Albumin: 4.3 g/dL (ref 3.5–5.2)
Alkaline Phosphatase: 64 U/L (ref 39–117)
BUN: 12 mg/dL (ref 6–23)
CO2: 26 mEq/L (ref 19–32)
Calcium: 10.5 mg/dL (ref 8.4–10.5)
Chloride: 104 mEq/L (ref 96–112)
Creatinine, Ser: 0.99 mg/dL (ref 0.40–1.50)
GFR: 81.53 mL/min (ref >60.00)
Glucose, Bld: 109 mg/dL — ABNORMAL HIGH (ref 70–99)
Potassium: 4.7 mEq/L (ref 3.5–5.1)
Sodium: 138 mEq/L (ref 135–145)
Total Bilirubin: 0.4 mg/dL (ref 0.2–1.2)
Total Protein: 7.7 g/dL (ref 6.0–8.3)

## 2014-11-07 LAB — LIPID PANEL
Cholesterol: 314 mg/dL — ABNORMAL HIGH (ref 0–200)
HDL: 38.8 mg/dL — ABNORMAL LOW (ref >39.00)
LDL Cholesterol: 239 mg/dL — ABNORMAL HIGH (ref 0–99)
NonHDL: 275.2
Total CHOL/HDL Ratio: 8
Triglycerides: 180 mg/dL — ABNORMAL HIGH (ref 0.0–149.0)
VLDL: 36 mg/dL (ref 0.0–40.0)

## 2014-11-07 LAB — TSH: TSH: 3.89 u[IU]/mL (ref 0.35–4.50)

## 2014-11-07 NOTE — Assessment & Plan Note (Signed)
Previous lipids drastically different from values today, suspect lab error. We will repeat in 6 months. Cannot take asa due to allergy. Calculate 10 year risk at next visit- transaminitis resolved and no longer a concern.

## 2014-11-07 NOTE — Patient Instructions (Addendum)
We are going to check your cholesterol today. I will probably talk to [facility] as well as you about your 10 year risk of heart attack stroke and we can decide on if you need a cholesterol medicine if...  Your liver function tests are normal which we are rechecking today.   Continue on flomax. I would talk to [facility] about his thoughts on PSA especially since it seems you have an enlarged prostate.   Sign release of information at the front desk for [facility] to have them send [place] records.   Let's plan on 6 month follow up unless labs show [place] a reason to see each other sooner  I would recommend a tetanus shot but medicare does not always pay, would have to check with your benefits about shingles vaccine. Plus ask Michael Boston what he thinks about this.  Health Maintenance Due  Topic Date Due  . TETANUS/TDAP  04/18/1972  . ZOSTAVAX  04/18/2013

## 2014-11-07 NOTE — Assessment & Plan Note (Signed)
Changed diagnosis from urinary obstruction to BPH. Patient no longer with cathether. Good urination pattern on flomax and no nocturia down from 2x a night. We will plan to continue indefinitely.

## 2014-11-07 NOTE — Progress Notes (Signed)
Tana Conch, MD Phone: [phone]  Subjective:   Stanley Boyd is a 64 y.o. year old very pleasant male patient who presents with the following:  Patient has done well since CAP. No longer having shortness of breath. Had follow up with pulmonology and CT scan showed clearing of PNA with scarring at lung base. Plan repeat in 1 year. Patient followed up with urology and catheter ultimately removed. Patient has continued on flomax. Patient likely with BPH. His nocturia from before hospitalization has resolved from previously 2x a night.   He had transaminase elevation at last visit with plans for repeat today. Also mild HLD detected OSH and plan for repeat to assess value today.   ROS- no chest pain, shortness of breath, no nocturia, no RUQ pain, no myalgias  Past Medical History- Patient Active Problem List   Diagnosis Date Noted  . Hemiparesis 09/18/2014    Priority: Medium  . BPH (benign prostatic hyperplasia) 09/18/2014    Priority: Medium  . Hyperlipidemia     Priority: Medium  . Bradycardia     Priority: Medium  . History of colonic polyps 10/15/2014    Priority: Low  . Transaminitis 09/18/2014    Priority: Low  . CAP (community acquired pneumonia) 09/18/2014   Medications- reviewed and updated Current Outpatient Prescriptions  Medication Sig Dispense Refill  . tamsulosin (FLOMAX) 0.4 MG CAPS capsule Take 1 capsule (0.4 mg total) by mouth daily after supper. 14 capsule 0   No current facility-administered medications for this visit.    Objective: BP 102/80 mmHg  Temp(Src) 97.9 F (36.6 C)  Wt 198 lb (89.812 kg) Gen: NAD, resting comfortably CV: RRR no murmurs rubs or gallops, HR mid 50s on exam Lungs: CTAB no crackles, wheeze, rhonchi Abdomen: soft/nontender/nondistended/normal bowel sounds.  Ext: no edema Skin: warm, dry, no rash  Neuro: grossly normal, moves all extremities   Assessment/Plan:  BPH (benign prostatic hyperplasia) Changed diagnosis from  urinary obstruction to BPH. Patient no longer with cathether. Good urination pattern on flomax and no nocturia down from 2x a night. We will plan to continue indefinitely.    Hyperlipidemia Previous lipids drastically different from values today, suspect lab error. We will repeat in 6 months. Cannot take asa due to allergy. Calculate 10 year risk at next visit- transaminitis resolved and no longer a concern.    Transaminitis AST after CAP hospitalization7 8, ALT 82, alk phos 152. Repeat values 11/07/14 normalized.    CAP (community acquired pneumonia) Resolved. CT chest showed clearing under pulmonology. Scarring at base with plan repeat ct in 1 year. Consider at 6 month follow up prevnar/pneumovax.     Return precautions advised. 6 month follow up planned. Will let us know where he had shingles. Td not covered by medicare likely.   Fasting reportedly Results for orders placed or performed in visit on 11/07/14 (from the past 24 hour(s))  CBC     Status: None   Collection Time: 11/07/14 10:42 AM  Result Value Ref Range   WBC 5.7 4.0 - 10.5 K/uL   RBC 4.86 4.22 - 5.81 Mil/uL   Platelets 234.0 150.0 - 400.0 K/uL   Hemoglobin 14.9 13.0 - 17.0 g/dL   HCT 17.5 30.1 - 04.0 %   MCV 90.3 78.0 - 100.0 fl   MCHC 33.9 30.0 - 36.0 g/dL   RDW 45.9 13.6 - 85.9 %  Comprehensive metabolic panel     Status: Abnormal   Collection Time: 11/07/14 10:42 AM  Result Value Ref  Range   Sodium 138 135 - 145 mEq/L   Potassium 4.7 3.5 - 5.1 mEq/L   Chloride 104 96 - 112 mEq/L   CO2 26 19 - 32 mEq/L   Glucose, Bld 109 (H) 70 - 99 mg/dL   BUN 12 6 - 23 mg/dL   Creatinine, Ser 0.99 0.40 - 1.50 mg/dL   Total Bilirubin 0.4 0.2 - 1.2 mg/dL   Alkaline Phosphatase 64 39 - 117 U/L   AST 20 0 - 37 U/L   ALT 19 0 - 53 U/L   Total Protein 7.7 6.0 - 8.3 g/dL   Albumin 4.3 3.5 - 5.2 g/dL   Calcium 10.5 8.4 - 10.5 mg/dL   GFR 81.53 >60.00 mL/min  Lipid panel     Status: Abnormal   Collection Time: 11/07/14  10:42 AM  Result Value Ref Range   Cholesterol 314 (H) 0 - 200 mg/dL   Triglycerides 180.0 (H) 0.0 - 149.0 mg/dL   HDL 38.80 (L) >39.00 mg/dL   VLDL 36.0 0.0 - 40.0 mg/dL   LDL Cholesterol 239 (H) 0 - 99 mg/dL   Total CHOL/HDL Ratio 8    NonHDL 275.20   TSH     Status: None   Collection Time: 11/07/14 10:42 AM  Result Value Ref Range   TSH 3.89 0.35 - 4.50 uIU/mL

## 2014-11-07 NOTE — Assessment & Plan Note (Signed)
AST after CAP hospitalization7 8, ALT 82, alk phos 152. Repeat values 11/07/14 normalized.

## 2014-11-07 NOTE — Assessment & Plan Note (Signed)
Resolved. CT chest showed clearing under pulmonology. Scarring at base with plan repeat ct in 1 year. Consider at 6 month follow up prevnar/pneumovax.

## 2015-05-08 ENCOUNTER — Ambulatory Visit: Payer: Medicare Other | Admitting: Family Medicine

## 2015-05-08 ENCOUNTER — Ambulatory Visit: Payer: BLUE CROSS/BLUE SHIELD | Admitting: Family Medicine

## 2015-06-10 ENCOUNTER — Other Ambulatory Visit: Payer: Self-pay | Admitting: Emergency Medicine

## 2015-06-10 DIAGNOSIS — R9389 Abnormal findings on diagnostic imaging of other specified body structures: Secondary | ICD-10-CM

## 2015-06-24 ENCOUNTER — Other Ambulatory Visit (INDEPENDENT_AMBULATORY_CARE_PROVIDER_SITE_OTHER): Payer: Medicare Other

## 2015-06-24 DIAGNOSIS — E785 Hyperlipidemia, unspecified: Secondary | ICD-10-CM

## 2015-06-24 LAB — LIPID PANEL
Cholesterol: 305 mg/dL — ABNORMAL HIGH (ref 0–200)
HDL: 38.4 mg/dL — ABNORMAL LOW (ref >39.00)
NonHDL: 266.89
Total CHOL/HDL Ratio: 8
Triglycerides: 235 mg/dL — ABNORMAL HIGH (ref 0.0–149.0)
VLDL: 47 mg/dL — ABNORMAL HIGH (ref 0.0–40.0)

## 2015-06-24 LAB — LDL CHOLESTEROL, DIRECT: Direct LDL: 227 mg/dL

## 2015-06-25 ENCOUNTER — Other Ambulatory Visit: Payer: Self-pay | Admitting: Oncology

## 2015-06-26 ENCOUNTER — Ambulatory Visit (INDEPENDENT_AMBULATORY_CARE_PROVIDER_SITE_OTHER): Payer: Medicare Other | Admitting: Family Medicine

## 2015-06-26 ENCOUNTER — Other Ambulatory Visit: Payer: Self-pay | Admitting: Family Medicine

## 2015-06-26 ENCOUNTER — Encounter: Payer: Self-pay | Admitting: Family Medicine

## 2015-06-26 VITALS — BP 100/62 | HR 60 | Temp 98.2°F | Wt 186.0 lb

## 2015-06-26 DIAGNOSIS — R6889 Other general symptoms and signs: Secondary | ICD-10-CM

## 2015-06-26 DIAGNOSIS — E785 Hyperlipidemia, unspecified: Secondary | ICD-10-CM

## 2015-06-26 DIAGNOSIS — Z23 Encounter for immunization: Secondary | ICD-10-CM

## 2015-06-26 DIAGNOSIS — R5383 Other fatigue: Secondary | ICD-10-CM

## 2015-06-26 DIAGNOSIS — Z20828 Contact with and (suspected) exposure to other viral communicable diseases: Secondary | ICD-10-CM

## 2015-06-26 DIAGNOSIS — N400 Enlarged prostate without lower urinary tract symptoms: Secondary | ICD-10-CM | POA: Diagnosis not present

## 2015-06-26 DIAGNOSIS — Z1159 Encounter for screening for other viral diseases: Secondary | ICD-10-CM

## 2015-06-26 LAB — CBC WITH DIFFERENTIAL/PLATELET
Basophils Absolute: 0 10*3/uL (ref 0.0–0.1)
Basophils Relative: 0.6 % (ref 0.0–3.0)
Eosinophils Absolute: 0 10*3/uL (ref 0.0–0.7)
Eosinophils Relative: 0.4 % (ref 0.0–5.0)
HCT: 44.9 % (ref 39.0–52.0)
Hemoglobin: 15.1 g/dL (ref 13.0–17.0)
Lymphocytes Relative: 28 % (ref 12.0–46.0)
Lymphs Abs: 2 10*3/uL (ref 0.7–4.0)
MCHC: 33.7 g/dL (ref 30.0–36.0)
MCV: 92.2 fl (ref 78.0–100.0)
Monocytes Absolute: 0.5 10*3/uL (ref 0.1–1.0)
Monocytes Relative: 6.9 % (ref 3.0–12.0)
Neutro Abs: 4.6 10*3/uL (ref 1.4–7.7)
Neutrophils Relative %: 64.1 % (ref 43.0–77.0)
Platelets: 214 10*3/uL (ref 150.0–400.0)
RBC: 4.87 Mil/uL (ref 4.22–5.81)
RDW: 13.6 % (ref 11.5–15.5)
WBC: 7.2 10*3/uL (ref 4.0–10.5)

## 2015-06-26 LAB — COMPREHENSIVE METABOLIC PANEL
ALT: 19 U/L (ref 0–53)
AST: 20 U/L (ref 0–37)
Albumin: 4.8 g/dL (ref 3.5–5.2)
Alkaline Phosphatase: 63 U/L (ref 39–117)
BUN: 18 mg/dL (ref 6–23)
CO2: 30 mEq/L (ref 19–32)
Calcium: 10.1 mg/dL (ref 8.4–10.5)
Chloride: 102 mEq/L (ref 96–112)
Creatinine, Ser: 1.16 mg/dL (ref 0.40–1.50)
GFR: 67.77 mL/min (ref >60.00)
Glucose, Bld: 80 mg/dL (ref 70–99)
Potassium: 4.6 mEq/L (ref 3.5–5.1)
Sodium: 139 mEq/L (ref 135–145)
Total Bilirubin: 0.5 mg/dL (ref 0.2–1.2)
Total Protein: 7.9 g/dL (ref 6.0–8.3)

## 2015-06-26 LAB — TSH: TSH: 1.92 u[IU]/mL (ref 0.35–4.50)

## 2015-06-26 LAB — PSA: PSA: 4.33 ng/mL — ABNORMAL HIGH (ref 0.10–4.00)

## 2015-06-26 MED ORDER — ATORVASTATIN CALCIUM 80 MG PO TABS: 80.0000 mg | ORAL_TABLET | Freq: Every day | ORAL | Status: DC

## 2015-06-26 MED ORDER — TAMSULOSIN HCL 0.4 MG PO CAPS: 0.4000 mg | ORAL_CAPSULE | Freq: Every day | ORAL | Status: DC

## 2015-06-26 NOTE — Patient Instructions (Addendum)
Received flu shot today.  Investigate your fatigue/cold sweats further from the last 3 months that seem to be worsening 1. Labs 2. Ekg 3. If labs and EKG ok- consider referral to cardiology  Start atorvastatin for very high cholesterol. Considered aspirin but you had lip swelling on this so we will avoid

## 2015-06-26 NOTE — Progress Notes (Addendum)
Stanley Reddish, MD  Subjective:  Stanley Boyd is a 64 y.o. year old very pleasant male patient who presents with:  Exercise intolerance/shortness of breath -[name] had a severe RLL pneumonia with parapneumonic effusion in 08/2015. He was hospitalized and thoguht to have potential post influenza MRSA PNA though he was a CAP. He received 14 days of antibiotics at OSH. He even had pulmonary follow up after meeting me in 09/2014 with CT chest showing "1. Finger like pleural extension at the right lower lobe has benign morphology. Favor pleural parenchymal scarring. 2. Resolution of the previously described right lower lobePneumonia."   Since that time, patient states he had a return to his normal energy level but then 3 months ago he has noted  Progressive fatigue.  He previously had no difficulty mowing his grass or doing other tasks.  He is only able to mow his grass for about 20 minutes now and then he tends to break out into cold sweat  And stops his activity.  He also feels slightly faint with this. he denies chest pain or shortness of breath with these events. He was deer hunting earlier today and had to cut some  Wood to get to the deer.   He had to stop after 10 minutes when normally he said he  could do an activity like this for hours.  In general he just feels more tired and has been laying around a lot more than usual.  Denies difficulty sleeping.  Patient denies weight loss but review of our records shows he is 12 pounds down.  ROS-  No fever or chills. No chest pain or shortness of breath. No dyspnea on exertion. No abdominal pain, decreased appetite  Past Medical History-  Patient Active Problem List   Diagnosis Date Noted  . Hemiparesis 09/18/2014    Priority: Medium  . BPH (benign prostatic hyperplasia) 09/18/2014    Priority: Medium  . Hyperlipidemia     Priority: Medium  . Bradycardia     Priority: Medium  . History of colonic polyps 10/15/2014    Priority: Low  . CAP  (community acquired pneumonia) 09/18/2014    Priority: Low  . Transaminitis 09/18/2014    Priority: Low   Medications- reviewed and updated Current Outpatient Prescriptions  Medication Sig Dispense Refill  . atorvastatin (LIPITOR) 80 MG tablet Take 1 tablet (80 mg total) by mouth daily. 30 tablet 11  . tamsulosin (FLOMAX) 0.4 MG CAPS capsule Take 1 capsule (0.4 mg total) by mouth daily after supper. 30 capsule 11   No current facility-administered medications for this visit.    Objective: BP 100/62 mmHg  Pulse 60  Temp(Src) 98.2 F (36.8 C)  Wt 186 lb (84.369 kg) Gen: NAD, resting comfortably CV: RRR no murmurs rubs or gallops, HR around 60 Lungs: CTAB no crackles, wheeze, rhonchi Abdomen: soft/nontender/nondistended/normal bowel sounds. No rebound or guarding.  Rectal: normal tone, diffusely enlarged prostate, no masses or tenderness but with mild R sided asymmetry  Ext: no edema Skin: warm, dry Neuro: grossly normal, moves all extremities   Results for orders placed or performed in visit on 06/26/15 (from the past 24 hour(s))  CBC with Differential/Platelet     Status: None   Collection Time: 06/26/15  3:09 PM  Result Value Ref Range   WBC 7.2 4.0 - 10.5 K/uL   RBC 4.87 4.22 - 5.81 Mil/uL   Hemoglobin 15.1 13.0 - 17.0 g/dL   HCT 44.9 39.0 - 52.0 %  MCV 92.2 78.0 - 100.0 fl   MCHC 33.7 30.0 - 36.0 g/dL   RDW 13.6 11.5 - 15.5 %   Platelets 214.0 150.0 - 400.0 K/uL   Neutrophils Relative % 64.1 43.0 - 77.0 %   Lymphocytes Relative 28.0 12.0 - 46.0 %   Monocytes Relative 6.9 3.0 - 12.0 %   Eosinophils Relative 0.4 0.0 - 5.0 %   Basophils Relative 0.6 0.0 - 3.0 %   Neutro Abs 4.6 1.4 - 7.7 K/uL   Lymphs Abs 2.0 0.7 - 4.0 K/uL   Monocytes Absolute 0.5 0.1 - 1.0 K/uL   Eosinophils Absolute 0.0 0.0 - 0.7 K/uL   Basophils Absolute 0.0 0.0 - 0.1 K/uL  Comprehensive metabolic panel     Status: None   Collection Time: 06/26/15  3:09 PM  Result Value Ref Range   Sodium 139  135 - 145 mEq/L   Potassium 4.6 3.5 - 5.1 mEq/L   Chloride 102 96 - 112 mEq/L   CO2 30 19 - 32 mEq/L   Glucose, Bld 80 70 - 99 mg/dL   BUN 18 6 - 23 mg/dL   Creatinine, Ser 1.16 0.40 - 1.50 mg/dL   Total Bilirubin 0.5 0.2 - 1.2 mg/dL   Alkaline Phosphatase 63 39 - 117 U/L   AST 20 0 - 37 U/L   ALT 19 0 - 53 U/L   Total Protein 7.9 6.0 - 8.3 g/dL   Albumin 4.8 3.5 - 5.2 g/dL   Calcium 10.1 8.4 - 10.5 mg/dL   GFR 67.77 >60.00 mL/min  PSA     Status: Abnormal   Collection Time: 06/26/15  3:09 PM  Result Value Ref Range   PSA 4.33 (H) 0.10 - 4.00 ng/mL  TSH     Status: None   Collection Time: 06/26/15  3:09 PM  Result Value Ref Range   TSH 1.92 0.35 - 4.50 uIU/mL  HIV antibody     Status: None   Collection Time: 06/26/15  3:09 PM  Result Value Ref Range   HIV 1&2 Ab, 4th Generation NONREACTIVE NONREACTIVE   Narrative   Performed at:  [place], [address]                [place], [place] [address]   Assessment/Plan:  Exercise intolerance/Fatigue/diaphoretic episodes 64 year old male with hyperlipidemia previously very active with some regression after Pneumonia last winter but then recovery now with worsening symptoms over last 3 months including progressive fatigue and exercise intolerance with diaphoretic episodes and faint feelings at times.  Labs are unremarkable except PSA with no obvious cause of fatigue. He is up-to-date on his colonoscopy , he has never been a smoker and I doubt lung cancer. He has no shortness of breath and lungs are clear- considered but did not get a CXR as he has an upcoming repeat CT in January. No abdominal symptoms to suggest abdominal malignancy. He has lost 12 lbs but wife has reportedly been coking better.  I am concerned about his PSA elevation and slight asymmetry in right side of prostate. We will repeat a PSA in 3 months and if trending up or for urology. He is our establish with urology. He is on Flomax for  BPH. Even if there was a local prostate cancer, I doubt this would cause systemic symptoms like he is having.   Patient does have bradycardia that  ranges from high 40s to low 60s without medication. Possible patient could be bradying down? My larger concern is for ischemia. I have discussed with patient's son Dr. Thersa Salt who is a family physician within [facility]. I would like to have patient evaluated by Dr. Einar Gip both for bradycardia as well as for potential stress testing. Lipids and age certainly increase his cardiac risk and would like his opinion on cardiac cause.   EKG: sinus brady rate 49, normal axis, normal intervals, no hypertrophy, anterolateral repolarization abnormality with ,1 box elevation in all leads except I, avL  Hyperlipidemia S: lipids are extremely elevated now x 2 when previously with much better control. Hyperlipidemia as his main cardiac risk factor. Aspirin allergy- reported lip swelling in past.  Lab Results  Component Value Date   CHOL 305* 06/24/2015   HDL 38.40* 06/24/2015   LDLCALC 239* 11/07/2014   LDLDIRECT 227.0 06/24/2015   TRIG 235.0* 06/24/2015   CHOLHDL 8 06/24/2015  A/P:  Start atorvastatin 80 mg.  would additionally start aspirin  if did not have allergy.    Return precautions advised.   Orders Placed This Encounter  Procedures  . Flu Vaccine QUAD 36+ mos IM  . CBC with Differential/Platelet  . Comprehensive metabolic panel    [facility]  . PSA  . TSH    [facility]  . HIV antibody    [facility]  . Hepatitis C antibody, reflex    [facility]  . EKG 12-Lead    Meds ordered this encounter  Medications  . tamsulosin (FLOMAX) 0.4 MG CAPS capsule    Sig: Take 1 capsule (0.4 mg total) by mouth daily after supper.    Dispense:  30 capsule    Refill:  11  . atorvastatin (LIPITOR) 80 MG tablet    Sig: Take 1 tablet (80 mg total) by mouth daily.    Dispense:  30 tablet    Refill:  11

## 2015-06-27 ENCOUNTER — Telehealth: Payer: Self-pay | Admitting: Family Medicine

## 2015-06-27 DIAGNOSIS — R5383 Other fatigue: Secondary | ICD-10-CM

## 2015-06-27 DIAGNOSIS — R6889 Other general symptoms and signs: Secondary | ICD-10-CM

## 2015-06-27 LAB — HIV ANTIBODY (ROUTINE TESTING W REFLEX): HIV 1&2 Ab, 4th Generation: NONREACTIVE

## 2015-06-27 LAB — HEPATITIS C ANTIBODY: HCV Ab: NEGATIVE

## 2015-06-27 NOTE — Telephone Encounter (Signed)
Spoke with son Dr. Lacinda Axon about results. Essentially normal. PSA is up to 4.33 and right side of prostate seemed mildly enlarged. I do not feel comfortable attributing patients fatigue, intermittent diaphoresis, and exercise intolerance to this though, Have placed a referral to Dr. Jenkins Rouge of cardiology for his thoughts on stress testing. We will also plan on PSA in 3 months.   Bevelyn Ngo- please call patient and let him know labs were pretty normal except for somewhat high PSA. Order a repeat PSA for him and have him do this within 3 months. Also please let him know he will be getting a call about cards referral- I entered this

## 2015-06-27 NOTE — Assessment & Plan Note (Signed)
S: lipids are extremely elevated now x 2 when previously with much better control. Hyperlipidemia as his main cardiac risk factor. Aspirin allergy- reported lip swelling in past.  Lab Results  Component Value Date   CHOL 305* 06/24/2015   HDL 38.40* 06/24/2015   LDLCALC 239* 11/07/2014   LDLDIRECT 227.0 06/24/2015   TRIG 235.0* 06/24/2015   CHOLHDL 8 06/24/2015  A/P:  Start atorvastatin 80 mg.  would additionally start aspirin  if did not have allergy.

## 2015-06-27 NOTE — Addendum Note (Signed)
Addended by: Clyde Lundborg A on: 06/27/2015 08:27 AM   Modules accepted: Orders

## 2015-06-27 NOTE — Telephone Encounter (Signed)
Called and left message on pt vm tcb. 

## 2015-06-27 NOTE — Telephone Encounter (Signed)
Pt returned call and notified.

## 2015-07-02 DIAGNOSIS — R55 Syncope and collapse: Secondary | ICD-10-CM

## 2015-07-02 NOTE — H&P (Signed)
OFFICE VISIT NOTES COPIED TO EPIC FOR DOCUMENTATION   Stanley Boyd. Stanley Boyd 2015/07/21 9:38 AM Location: [facility] Patient #: [phone] DOB: [birth year] Married / Language: Stanley Boyd / Race: White Male   History of Present Illness Stanley Page MD; 07/21/2015 11:28 AM) Patient words: NP EVAL for fatigue, exercise intolerance; Pt states that he has had cold sweats, nauseated, with any exertion pt begins to be exhausted. Pt states he has had symptoms for 3 months or so. Pt does see a Pulmonologist. Pt has history of pneumonia. Pt had EKG done on Friday, and it is in EPIC.  The patient is a 64 year old male who presents for syncope. He had been doing well until sometime in November 2015, had developed pneumonia and was hospitalized for a week. Since then he has not felt well, felt well sometime in February but beginning of March he started to feel marked fatigue, also near syncopal spells with exertional activity. He feels warmth all over the body, followed by marked diaphoresis and feeling he is going to pass out and had to sit down and rest and feels well after resting and he is able to get back to his usual activities. However in the recent few weeks, even minimal activity has brought on symptoms and he has been extremely scared to do any activity. He denies any specific chest pain or shortness of breath. No symptoms to suggest TIA or claudication. No headache or visual disturbances. No recent weight changes.   Problem List/Past Medical Stanley Boyd; 07-21-15 9:52 AM) Hypercholesterolemia (E78.0) BPH (benign prostatic hyperplasia) (N40.0) History of pneumonia (Z87.01)  Allergies (Stanley Boyd; July 21, 2015 4:26 AM) Aspirin (Salicylates) lip swelling.  Family History Stanley Boyd; July 21, 2015 9:48 AM) Mother Deceased. at age 59, from ALzheimers. No known Heart Conditions Father Deceased. in his 1's, from [place]. No known Heart Conditions Sister 2 1 older, 1 younger;  Both Hx of DM Brother 1 older; Known Heart murmur. Hx of Stroke in early 60's. Hx of Stent  Social History Stanley Boyd; 2015/07/21 9:48 AM) Current tobacco use Never smoker. Non Drinker/No Alcohol Use Marital status Married. Number of Children 2. Living Situation Lives with spouse.  Past Surgical History Stanley Boyd; Jul 21, 2015 9:49 AM) Cervical Fusion 2000 1985, 1996 Back Surgery 1984  Medication History Stanley Boyd; Jul 21, 2015 9:50 AM) Tamsulosin HCl (0.4MG  Capsule, 1 Oral daily) Active. Atorvastatin Calcium (80MG  Tablet, 1 Oral daily) Active. Amoxicillin (500MG  Capsule, 1 Oral prior to dental procedure) Active.  Diagnostic Studies History Stanley Boyd; 07/21/15 9:51 AM) Colonoscopy2012    Review of Systems Stanley Page MD; 07/21/15 11:26 AM) General Present- Fatigue and Feeling well. Not Present- Fever and Night Sweats. Skin Not Present- Itching and Rash. HEENT Not Present- Headache. Respiratory Not Present- Difficulty Breathing. Cardiovascular Not Present- Claudications, Fainting, Orthopnea and Swelling of Extremities. Gastrointestinal Not Present- Abdominal Pain, Constipation, Diarrhea, Nausea and Vomiting. Musculoskeletal Not Present- Joint Swelling. Neurological Present- Dysesthesia (Right side of the body with mild is just a and also mild weakness since spinal cord injury years ago.), Fainting and Weakness In Extremities (Right side of the body with mild is just a and also mild weakness since spinal cord injury years ago.). Not Present- Headaches, Spinning Sensation and Tremor. Hematology Not Present- Blood Clots, Easy Bruising and Nose Bleed.  Vitals Stanley Boyd; 07/21/15 9:54 AM) 07/21/2015 9:44 AM Weight: 188.56 lb Height: 69in Body Surface Area: 2.01 m Body Mass Index: 27.85 kg/m  Pulse: 49 (Regular)  P.OX: 98% (Room air) BP: 108/68 (Sitting,  Left Arm, Standard)       Physical Exam Stanley Page MD;  06/30/2015 11:25 AM) General Mental Status-Alert. General Appearance-Cooperative, Appears stated age, Not in acute distress. Orientation-Oriented X3. Build & Nutrition-Well built.  Head and Neck Thyroid Gland Characteristics - no palpable nodules, no palpable enlargement.  Chest and Lung Exam Palpation Tender - No chest wall tenderness. Auscultation Breath sounds - Clear.  Cardiovascular Inspection Jugular vein - Right - No Distention. Auscultation Heart Sounds - S1 WNL, S2 WNL and No gallop present. Murmurs & Other Heart Sounds - Murmur - No murmur.  Abdomen Palpation/Percussion Palpation and Percussion of the abdomen reveal - Non Tender and No hepatosplenomegaly. Auscultation Auscultation of the abdomen reveals - Bowel sounds normal.  Peripheral Vascular Lower Extremity Inspection - Left - No Pigmentation, No Varicose veins. Right - No Pigmentation, No Varicose veins. Palpation - Edema - Left - No edema. Right - No edema. Femoral pulse - Left - Normal. Right - Normal. Popliteal pulse - Left - Normal. Right - Normal. Dorsalis pedis pulse - Left - Normal. Right - Normal. Posterior tibial pulse - Left - Normal. Right - Normal. Carotid arteries - Left-No Carotid bruit. Carotid arteries - Right-No Carotid bruit. Abdomen-No prominent abdominal aortic pulsation, No epigastric bruit.  Neurologic Motor-Grossly intact without any focal deficits.  Musculoskeletal Global Assessment Left Lower Extremity - normal range of motion without pain. Right Lower Extremity - normal range of motion without pain.    Assessment & Plan Stanley Page MD; 06/30/2015 5:59 PM) Near syncope (R55) Story: EKG 06/26/15: Marked sinus bradycardia at rate of 49 bpm, otherwise normal EKG.  Echocardiogram 09/06/2014: Normal LV systolic function, borderline LVH, EF 60-65%. Mild mitral and tricuspid regurgitation, unable to estimate PA pressure. Current Plans Started Aspirin Adult Low  Strength 81MG , 1 (one) Tablet Chewable daily, #360, 06/30/2015, No Refill. Event Monitor (27035) METABOLIC PANEL, BASIC (00938) CBC & PLATELETS (AUTO) (85027) PT (PROTHROMBIN TIME) (18299) Hypercholesterolemia (E78.0) Story: Labs 06/24/2015: total cholesterol 305, triglycerides 235, HDL 38, LDL direct 227, TSH 1.92, CMP normal, CBC normal Current Plans Mechanism of underlying disease process and action of medications discussed with the patient. I discussed primary/secondary prevention and also dietary counceling was done. Patient presenting with episodes of near-syncope with exertion, each episode occurring always with exertion activity, has a sense of impending doom. 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  Long discussion regarding proceeding directly with coronary angiography versus stress testing. Patient would like to proceed directly with coronary angiography which I have agreed upon. Although he has no other cardiovascular risk, new onset of exertional near syncope associated with markedly elevated lipids places him at ahigh risk for cardiac etiology. On discussions held regarding stress testing, patient states that he is extremely scared about being on the treadmill as he feels that he will pass out. He prefers angiography. Schedule for cardiac catheterization, and possible angioplasty. We discussed regarding risks, benefits, alternatives to this including stress testing, CTA and continued medical therapy. Patient wants to proceed. Understands <1-2% risk of death, stroke, MI, urgent CABG, bleeding, infection, renal failure but not limited to these. Video recording of the procedure shown to the patient.  I'll also perform an event for 30 days to evaluate his symptoms. Office visit after the test. Patient advised to start aspirin 81 mg by mouth daily for primary prevention. I'm unable to use any beta blockers due to marked sinus bradycardia. Blood pressure also very  low. Presently on maximum dose of statin that  was started on week ago.    Signed by Stanley Page, MD (06/30/2015 5:59 PM)

## 2015-07-08 ENCOUNTER — Encounter (HOSPITAL_COMMUNITY)
Admission: RE | Disposition: A | Discharge status: Home / Self Care | Payer: Self-pay | Source: Ambulatory Visit | Attending: Cardiology

## 2015-07-08 ENCOUNTER — Ambulatory Visit (HOSPITAL_COMMUNITY)
Admission: RE | Admit: 2015-07-08 | Discharge: 2015-07-09 | Disposition: A | Discharge status: Home / Self Care | Payer: Medicare Other | Source: Ambulatory Visit | Attending: Cardiology | Admitting: Cardiology

## 2015-07-08 ENCOUNTER — Encounter (HOSPITAL_COMMUNITY): Payer: Self-pay | Admitting: General Practice

## 2015-07-08 DIAGNOSIS — N400 Enlarged prostate without lower urinary tract symptoms: Secondary | ICD-10-CM | POA: Insufficient documentation

## 2015-07-08 DIAGNOSIS — I2584 Coronary atherosclerosis due to calcified coronary lesion: Secondary | ICD-10-CM | POA: Diagnosis not present

## 2015-07-08 DIAGNOSIS — E784 Other hyperlipidemia: Secondary | ICD-10-CM | POA: Insufficient documentation

## 2015-07-08 DIAGNOSIS — Z79899 Other long term (current) drug therapy: Secondary | ICD-10-CM | POA: Diagnosis not present

## 2015-07-08 DIAGNOSIS — Z9861 Coronary angioplasty status: Secondary | ICD-10-CM

## 2015-07-08 DIAGNOSIS — R55 Syncope and collapse: Secondary | ICD-10-CM | POA: Diagnosis present

## 2015-07-08 DIAGNOSIS — I25119 Atherosclerotic heart disease of native coronary artery with unspecified angina pectoris: Secondary | ICD-10-CM | POA: Diagnosis not present

## 2015-07-08 HISTORY — DX: Calculus of kidney: N20.0

## 2015-07-08 HISTORY — DX: Headache, unspecified: R51.9

## 2015-07-08 HISTORY — DX: Pneumonia, unspecified organism: J18.9

## 2015-07-08 HISTORY — PX: CARDIAC CATHETERIZATION: SHX172

## 2015-07-08 HISTORY — PX: CORONARY ANGIOPLASTY WITH STENT PLACEMENT: SHX49

## 2015-07-08 HISTORY — DX: Headache: R51

## 2015-07-08 HISTORY — DX: Atherosclerotic heart disease of native coronary artery without angina pectoris: I25.10

## 2015-07-08 LAB — POCT ACTIVATED CLOTTING TIME
Activated Clotting Time: 233 seconds
Activated Clotting Time: 552 seconds

## 2015-07-08 SURGERY — LEFT HEART CATH AND CORONARY ANGIOGRAPHY

## 2015-07-08 MED ORDER — VERAPAMIL HCL 2.5 MG/ML IV SOLN
INTRA_ARTERIAL | Status: DC | PRN
  Administered 2015-07-08: 5 mL via INTRA_ARTERIAL

## 2015-07-08 MED ORDER — LIDOCAINE HCL (PF) 1 % IJ SOLN
INTRAMUSCULAR | Status: AC
  Filled 2015-07-08: qty 30

## 2015-07-08 MED ORDER — HEPARIN SODIUM (PORCINE) 1000 UNIT/ML IJ SOLN
INTRAMUSCULAR | Status: AC
  Filled 2015-07-08: qty 1

## 2015-07-08 MED ORDER — SODIUM CHLORIDE 0.9 % IV SOLN: 250.0000 mL | INTRAVENOUS | Status: DC | PRN

## 2015-07-08 MED ORDER — MIDAZOLAM HCL 2 MG/2ML IJ SOLN
INTRAMUSCULAR | Status: DC | PRN
  Administered 2015-07-08: 2 mg via INTRAVENOUS

## 2015-07-08 MED ORDER — TICAGRELOR 90 MG PO TABS
90.0000 mg | ORAL_TABLET | Freq: Two times a day (BID) | ORAL | Status: DC
  Administered 2015-07-08 – 2015-07-09 (×2): 90 mg via ORAL
  Filled 2015-07-08 (×2): qty 1

## 2015-07-08 MED ORDER — TAMSULOSIN HCL 0.4 MG PO CAPS
0.4000 mg | ORAL_CAPSULE | Freq: Every day | ORAL | Status: DC
  Administered 2015-07-08: 17:00:00 0.4 mg via ORAL
  Filled 2015-07-08: qty 1

## 2015-07-08 MED ORDER — NITROGLYCERIN 1 MG/10 ML FOR IR/CATH LAB
INTRA_ARTERIAL | Status: DC | PRN
  Administered 2015-07-08: 200 ug via INTRACORONARY
  Administered 2015-07-08: 100 ug via INTRACORONARY

## 2015-07-08 MED ORDER — ASPIRIN 81 MG PO CHEW
81.0000 mg | CHEWABLE_TABLET | Freq: Every day | ORAL | Status: DC
  Administered 2015-07-09: 09:00:00 81 mg via ORAL
  Filled 2015-07-08: qty 1

## 2015-07-08 MED ORDER — SODIUM CHLORIDE 0.9 % IV SOLN
250.0000 mg | INTRAVENOUS | Status: DC | PRN
  Administered 2015-07-08: 1.75 mg/kg/h via INTRAVENOUS

## 2015-07-08 MED ORDER — NITROGLYCERIN 0.4 MG SL SUBL
SUBLINGUAL_TABLET | SUBLINGUAL | Status: AC
Start: 2015-07-08 — End: 2015-07-09
  Filled 2015-07-08: qty 1

## 2015-07-08 MED ORDER — TICAGRELOR 90 MG PO TABS
ORAL_TABLET | ORAL | Status: AC
  Filled 2015-07-08: qty 2

## 2015-07-08 MED ORDER — BIVALIRUDIN BOLUS VIA INFUSION - CUPID
INTRAVENOUS | Status: DC | PRN
  Administered 2015-07-08: 62.925 mg via INTRAVENOUS

## 2015-07-08 MED ORDER — ACETAMINOPHEN 325 MG PO TABS: 650.0000 mg | ORAL_TABLET | ORAL | Status: DC | PRN

## 2015-07-08 MED ORDER — MIDAZOLAM HCL 2 MG/2ML IJ SOLN
INTRAMUSCULAR | Status: AC
  Filled 2015-07-08: qty 4

## 2015-07-08 MED ORDER — HEPARIN (PORCINE) IN NACL 2-0.9 UNIT/ML-% IJ SOLN
INTRAMUSCULAR | Status: AC
  Filled 2015-07-08: qty 1500

## 2015-07-08 MED ORDER — VERAPAMIL HCL 2.5 MG/ML IV SOLN
INTRAVENOUS | Status: AC
  Filled 2015-07-08: qty 2

## 2015-07-08 MED ORDER — SODIUM CHLORIDE 0.9 % IJ SOLN: 3.0000 mL | INTRAMUSCULAR | Status: DC | PRN

## 2015-07-08 MED ORDER — LIDOCAINE HCL (PF) 1 % IJ SOLN
INTRAMUSCULAR | Status: DC | PRN
  Administered 2015-07-08: 5 mL

## 2015-07-08 MED ORDER — ALPRAZOLAM 0.25 MG PO TABS: 0.2500 mg | ORAL_TABLET | Freq: Two times a day (BID) | ORAL | Status: DC | PRN

## 2015-07-08 MED ORDER — NITROGLYCERIN 1 MG/10 ML FOR IR/CATH LAB
INTRA_ARTERIAL | Status: AC
  Filled 2015-07-08: qty 10

## 2015-07-08 MED ORDER — BIVALIRUDIN 250 MG IV SOLR
INTRAVENOUS | Status: AC
  Filled 2015-07-08: qty 250

## 2015-07-08 MED ORDER — SODIUM CHLORIDE 0.9 % WEIGHT BASED INFUSION: 3.0000 mL/kg/h | INTRAVENOUS | Status: AC

## 2015-07-08 MED ORDER — LIDOCAINE HCL (PF) 1 % IJ SOLN
INTRAMUSCULAR | Status: DC | PRN
  Administered 2015-07-08: 500 mL

## 2015-07-08 MED ORDER — HEPARIN SODIUM (PORCINE) 1000 UNIT/ML IJ SOLN
INTRAMUSCULAR | Status: DC | PRN
  Administered 2015-07-08: 3000 [IU] via INTRAVENOUS
  Administered 2015-07-08: 4500 [IU] via INTRAVENOUS

## 2015-07-08 MED ORDER — SODIUM CHLORIDE 0.9 % WEIGHT BASED INFUSION
1.0000 mL/kg/h | INTRAVENOUS | Status: DC
  Administered 2015-07-08: 1 mL/kg/h via INTRAVENOUS

## 2015-07-08 MED ORDER — CARVEDILOL 3.125 MG PO TABS
3.1250 mg | ORAL_TABLET | Freq: Two times a day (BID) | ORAL | Status: DC
Start: 2015-07-08 — End: 2015-07-09
  Administered 2015-07-08 – 2015-07-09 (×2): 3.125 mg via ORAL
  Filled 2015-07-08 (×2): qty 1

## 2015-07-08 MED ORDER — ATORVASTATIN CALCIUM 80 MG PO TABS
80.0000 mg | ORAL_TABLET | Freq: Every day | ORAL | Status: DC
  Administered 2015-07-08: 17:00:00 80 mg via ORAL
  Filled 2015-07-08: qty 1

## 2015-07-08 MED ORDER — IOHEXOL 350 MG/ML SOLN
INTRAVENOUS | Status: DC | PRN
  Administered 2015-07-08: 110 mL via INTRAVENOUS

## 2015-07-08 MED ORDER — ONDANSETRON HCL 4 MG/2ML IJ SOLN: 4.0000 mg | Freq: Four times a day (QID) | INTRAMUSCULAR | Status: DC | PRN

## 2015-07-08 MED ORDER — ASPIRIN 81 MG PO CHEW
CHEWABLE_TABLET | ORAL | Status: AC
  Filled 2015-07-08: qty 1

## 2015-07-08 MED ORDER — ASPIRIN 81 MG PO CHEW
81.0000 mg | CHEWABLE_TABLET | Freq: Once | ORAL | Status: AC
  Administered 2015-07-08: 81 mg via ORAL

## 2015-07-08 MED ORDER — ZOLPIDEM TARTRATE 5 MG PO TABS: 5.0000 mg | ORAL_TABLET | Freq: Every evening | ORAL | Status: DC | PRN

## 2015-07-08 MED ORDER — SODIUM CHLORIDE 0.9 % WEIGHT BASED INFUSION
3.0000 mL/kg/h | INTRAVENOUS | Status: DC
  Administered 2015-07-08 (×2): 3 mL/kg/h via INTRAVENOUS

## 2015-07-08 MED ORDER — SODIUM CHLORIDE 0.9 % IJ SOLN
3.0000 mL | Freq: Two times a day (BID) | INTRAMUSCULAR | Status: DC
  Administered 2015-07-08 – 2015-07-09 (×3): 3 mL via INTRAVENOUS

## 2015-07-08 MED ORDER — SODIUM CHLORIDE 0.9 % IJ SOLN: 3.0000 mL | Freq: Two times a day (BID) | INTRAMUSCULAR | Status: DC

## 2015-07-08 MED ORDER — TICAGRELOR 90 MG PO TABS
ORAL_TABLET | ORAL | Status: DC | PRN
  Administered 2015-07-08: 180 mg via ORAL

## 2015-07-08 SURGICAL SUPPLY — 15 items
BALLN EMERGE MR 2.5X30 (BALLOONS) ×2
BALLOON EMERGE MR 2.5X30 (BALLOONS) ×1 IMPLANT
CATH OPTITORQUE TIG 4.0 5F (CATHETERS) ×2 IMPLANT
CATH VISTA GUIDE 6FR XB3.5 (CATHETERS) ×2 IMPLANT
DEVICE RAD COMP TR BAND LRG (VASCULAR PRODUCTS) ×2 IMPLANT
GLIDESHEATH SLEND A-KIT 6F 20G (SHEATH) ×2 IMPLANT
KIT ENCORE 26 ADVANTAGE (KITS) ×2 IMPLANT
KIT HEART LEFT (KITS) ×2 IMPLANT
PACK CARDIAC CATHETERIZATION (CUSTOM PROCEDURE TRAY) ×2 IMPLANT
STENT RESOLUTE INTEG 3.0X15 (Permanent Stent) ×2 IMPLANT
STENT RESOLUTE INTEG 3.0X38 (Permanent Stent) ×2 IMPLANT
TRANSDUCER W/STOPCOCK (MISCELLANEOUS) ×2 IMPLANT
TUBING CIL FLEX 10 FLL-RA (TUBING) ×2 IMPLANT
WIRE COUGAR XT STRL 190CM (WIRE) ×2 IMPLANT
WIRE SAFE-T 1.5MM-J .035X260CM (WIRE) ×2 IMPLANT

## 2015-07-08 NOTE — Progress Notes (Signed)
TR BAND REMOVAL  LOCATION:    right radial  DEFLATED PER PROTOCOL:    Yes.    TIME BAND OFF / DRESSING APPLIED:    1515   SITE UPON ARRIVAL:    Level 0  SITE AFTER BAND REMOVAL:    Level 0  REVERSE ALLEN'S TEST:     positive  CIRCULATION SENSATION AND MOVEMENT:    Within Normal Limits   Yes.    COMMENTS:   Tolerated procedure well

## 2015-07-08 NOTE — Progress Notes (Signed)
CM spoke with pt and provided pt with Brilinta booklet with 30 day free card enclosed . CM explained card usage and pt verbally stated understanding of how to use card. [facility] in [place]  called per CM to confirmed medication is in stock, and pt made aware. Benefits check in process. CM to f/u with results  and inform pt when available. Whitman Hero RN, [place] [phone]

## 2015-07-08 NOTE — Interval H&P Note (Signed)
History and Physical Interval Note:  07/08/2015 9:59 AM  Stanley Boyd  has presented today for surgery, with the diagnosis of snycope  The various methods of treatment have been discussed with the patient and family. After consideration of risks, benefits and other options for treatment, the patient has consented to  Procedure(s): Left Heart Cath and Coronary Angiography (N/A) and possible PCI as a surgical intervention .  The patient's history has been reviewed, patient examined, no change in status, stable for surgery.  I have reviewed the patient's chart and labs.  Questions were answered to the patient's satisfaction.   Ischemic Symptoms? CCS III (Marked limitation of ordinary activity) Anti-ischemic Medical Therapy? No Therapy Non-invasive Test Results? No non-invasive testing performed Prior CABG? No Previous CABG   Patient Information:   1-2V CAD, no prox LAD  A (7)  Indication: 20; Score: 7   Patient Information:   1-2V-CAD with DS 50-60% With No FFR, No IVUS  I (3)  Indication: 21; Score: 3   Patient Information:   1-2V-CAD with DS 50-60% With FFR  A (7)  Indication: 22; Score: 7   Patient Information:   1-2V-CAD with DS 50-60% With FFR>0.8, IVUS not significant  I (2)  Indication: 23; Score: 2   Patient Information:   3V-CAD without LMCA With Abnormal LV systolic function  A (9)  Indication: 48; Score: 9   Patient Information:   LMCA-CAD  A (9)  Indication: 49; Score: 9   Patient Information:   2V-CAD with prox LAD PCI  A (7)  Indication: 62; Score: 7   Patient Information:   2V-CAD with prox LAD CABG  A (8)  Indication: 62; Score: 8   Patient Information:   3V-CAD without LMCA With Low CAD burden(i.e., 3 focal stenoses, low SYNTAX score) PCI  A (7)  Indication: 63; Score: 7   Patient Information:   3V-CAD without LMCA With Low CAD burden(i.e., 3 focal stenoses, low SYNTAX score) CABG  A (9)  Indication: 63; Score:  9   Patient Information:   3V-CAD without LMCA E06c - Intermediate-high CAD burden (i.e., multiple diffuse lesions, presence of CTO, or high SYNTAX score) PCI  U (4)  Indication: 64; Score: 4   Patient Information:   3V-CAD without LMCA E06c - Intermediate-high CAD burden (i.e., multiple diffuse lesions, presence of CTO, or high SYNTAX score) CABG  A (9)  Indication: 64; Score: 9   Patient Information:   LMCA-CAD With Isolated LMCA stenosis  PCI  U (6)  Indication: 65; Score: 6   Patient Information:   LMCA-CAD With Isolated LMCA stenosis  CABG  A (9)  Indication: 65; Score: 9   Patient Information:   LMCA-CAD Additional CAD, low CAD burden (i.e., 1- to 2-vessel additional involvement, low SYNTAX score) PCI  U (5)  Indication: 66; Score: 5   Patient Information:   LMCA-CAD Additional CAD, low CAD burden (i.e., 1- to 2-vessel additional involvement, low SYNTAX score) CABG  A (9)  Indication: 66; Score: 9   Patient Information:   LMCA-CAD Additional CAD, intermediate-high CAD burden (i.e., 3-vessel involvement, presence of CTO, or high SYNTAX score) PCI  I (3)  Indication: 67; Score: 3   Patient Information:   LMCA-CAD Additional CAD, intermediate-high CAD burden (i.e., 3-vessel involvement, presence of CTO, or high SYNTAX score) CABG  A (9)  Indication: 67; Score: 9  [name]

## 2015-07-08 NOTE — Progress Notes (Signed)
Patient c/o of chest pressure since out of cath today that is not severe. EKG done and and SL nitro offered but he refused at this time saying the pressure is a little better. MD was notified about the chest pressure and agrees may try SL nitro if needed. Patient instructed to call me if chest pressure returns.

## 2015-07-09 ENCOUNTER — Encounter (HOSPITAL_COMMUNITY): Payer: Self-pay | Admitting: Cardiology

## 2015-07-09 DIAGNOSIS — E784 Other hyperlipidemia: Secondary | ICD-10-CM | POA: Diagnosis not present

## 2015-07-09 DIAGNOSIS — N400 Enlarged prostate without lower urinary tract symptoms: Secondary | ICD-10-CM | POA: Diagnosis not present

## 2015-07-09 DIAGNOSIS — I2584 Coronary atherosclerosis due to calcified coronary lesion: Secondary | ICD-10-CM | POA: Diagnosis not present

## 2015-07-09 DIAGNOSIS — I25119 Atherosclerotic heart disease of native coronary artery with unspecified angina pectoris: Secondary | ICD-10-CM | POA: Diagnosis not present

## 2015-07-09 LAB — BASIC METABOLIC PANEL
Anion gap: 8 (ref 5–15)
BUN: 10 mg/dL (ref 6–20)
CO2: 25 mmol/L (ref 22–32)
Calcium: 9.2 mg/dL (ref 8.9–10.3)
Chloride: 102 mmol/L (ref 101–111)
Creatinine, Ser: 1.09 mg/dL (ref 0.61–1.24)
GFR calc Af Amer: >60 mL/min (ref >60)
GFR calc non Af Amer: >60 mL/min (ref >60)
Glucose, Bld: 123 mg/dL — ABNORMAL HIGH (ref 65–99)
Potassium: 3.6 mmol/L (ref 3.5–5.1)
Sodium: 135 mmol/L (ref 135–145)

## 2015-07-09 LAB — CBC
HCT: 41.3 % (ref 39.0–52.0)
Hemoglobin: 14.2 g/dL (ref 13.0–17.0)
MCH: 31.1 pg (ref 26.0–34.0)
MCHC: 34.4 g/dL (ref 30.0–36.0)
MCV: 90.4 fL (ref 78.0–100.0)
Platelets: 182 10*3/uL (ref 150–400)
RBC: 4.57 MIL/uL (ref 4.22–5.81)
RDW: 12.8 % (ref 11.5–15.5)
WBC: 8.4 10*3/uL (ref 4.0–10.5)

## 2015-07-09 MED ORDER — CARVEDILOL 3.125 MG PO TABS: 3.1250 mg | ORAL_TABLET | Freq: Two times a day (BID) | ORAL | Status: DC

## 2015-07-09 MED ORDER — ASPIRIN 81 MG PO CHEW: 81.0000 mg | CHEWABLE_TABLET | Freq: Every day | ORAL | Status: DC

## 2015-07-09 MED ORDER — TICAGRELOR 90 MG PO TABS: 90.0000 mg | ORAL_TABLET | Freq: Two times a day (BID) | ORAL | Status: DC

## 2015-07-09 MED ORDER — NITROGLYCERIN 0.4 MG SL SUBL: 0.4000 mg | SUBLINGUAL_TABLET | SUBLINGUAL | Status: DC | PRN

## 2015-07-09 NOTE — Discharge Summary (Signed)
Physician Discharge Summary  Patient ID: Stanley Boyd MRN: [id number] DOB/AGE: 1950-10-19 64 y.o.  Admit date: 07/08/2015 Discharge date: 07/09/2015  Primary Discharge Diagnosis: CAD with angina  Secondary Discharge Diagnosis: Hyperlipidemia  Significant Diagnostic Studies: Coronary angiogram 07/09/2015:  1. Calcified, moderate, high-grade 80% long stenosis in the proximal LAD and mid LAD, tandem lesions, distal lesion 90%. Successful PCI with 3.0 x 38 and 3.0 x 15 mm resolute DES, stenosis reduced to 0%. Diagonal to ostial 80-90% stenosis, brisk flow is evident post PCI, continue medical therapy. 2. Complex bifurcating proximal circumflex and large OM1 stenosis of 80-90% that needs staged PCI. 3. Normal LVEF, 60%.  Hospital Course:  The patient is a 64 year old male who had presented to the office for syncope. He had been doing well until sometime in November 2015, had developed pneumonia and was hospitalized for a week. Since then he had not felt well, with marked fatigue, also near syncopal spells with exertional activity. He would feel warmth all over the body, followed by marked diaphoresis and feeling he is going to pass out and had to sit down and rest and feels well after resting and he is able to get back to his usual activities.  However, in the recent few weeks, even minimal activity has brought on symptoms and he has been extremely scared to do any activity. He denies any specific chest pain or shortness of breath.   Due to exertional syncope, sense of impending doom, and significant hyperlipidemia, likely familial, he was scheduled for coronary angiography to evaluate for CAD with revealed high-grade 80% long stenosis in the proximal LAD and mid LAD, distal lesion 90% and underwent successful PCI with 3.0 x 38 and 3.0 x 15 mm resolute DES, stenosis reduced to 0%.  Angiogram also revealed diagonal to ostial 80-90% stenosis, with brisk flow evident post PCI, and large OM1 stenosis of  80-90% that needs staged PCI.  Provided he is able to ambulate with cardiac rehab without chest pain, he can be discharged home today.  Recommendations on discharge: Continue DAPT with ASA and Brilinta as well as high dose, high intensity statin, and BB. He is presently tolerating low dose Coreg without symptomatic bradycardia or hypotension.  Follow up outpatient next week as scheduled to discuss repeat coronary angiography and PCI of OM branch.  Discharge Exam: Blood pressure 101/51, pulse 61, temperature 98.6 F (37 C), temperature source Oral, resp. rate 19, height 5\' 9"  (1.753 m), weight 84.5 kg (186 lb 4.6 oz), SpO2 96 %.    General appearance: alert, cooperative, appears stated age and no distress Neck: no adenopathy, no carotid bruit, no JVD, supple, symmetrical, trachea midline and thyroid not enlarged, symmetric, no tenderness/mass/nodules Resp: clear to auscultation bilaterally Chest wall: no tenderness Cardio: regular rate and rhythm, S1, S2 normal, no murmur, click, rub or gallop Extremities: extremities normal, atraumatic, no cyanosis or edema Pulses: 2+ and symmetric, right radial access site asymptomatic  Labs:   Lab Results  Component Value Date   WBC 8.4 07/09/2015   HGB 14.2 07/09/2015   HCT 41.3 07/09/2015   MCV 90.4 07/09/2015   PLT 182 07/09/2015    Recent Labs Lab 07/09/15 0412  NA 135  K 3.6  CL 102  CO2 25  BUN 10  CREATININE 1.09  CALCIUM 9.2  GLUCOSE 123*    Lipid Panel     Component Value Date/Time   CHOL 305* 06/24/2015 0914   TRIG 235.0* 06/24/2015 0914   HDL 38.40*  06/24/2015 0914   CHOLHDL 8 06/24/2015 0914   VLDL 47.0* 06/24/2015 0914   LDLCALC 239* 11/07/2014 1042    BNP (last 3 results) No results for input(s): BNP in the last 8760 hours.  ProBNP (last 3 results) No results for input(s): PROBNP in the last 8760 hours.  HEMOGLOBIN A1C No results found for: HGBA1C, MPG  Cardiac Panel (last 3 results) No results for input(s):  CKTOTAL, CKMB, TROPONINI, RELINDX in the last 8760 hours.  No results found for: CKTOTAL, CKMB, CKMBINDEX, TROPONINI   TSH  Recent Labs  11/07/14 1042 06/26/15 1509  TSH 3.89 1.92    EKG 07/09/2015: sinus bradycardia at a rate of 56 bpm, frequent PACs, IRBBB, PRWP   Radiology: No results found.    FOLLOW UP PLANS AND APPOINTMENTS    Medication List    TAKE these medications        aspirin 81 MG chewable tablet  Chew 1 tablet (81 mg total) by mouth daily.     atorvastatin 80 MG tablet  Commonly known as:  LIPITOR  Take 1 tablet (80 mg total) by mouth daily.     carvedilol 3.125 MG tablet  Commonly known as:  COREG  Take 1 tablet (3.125 mg total) by mouth 2 (two) times daily with a meal.     tamsulosin 0.4 MG Caps capsule  Commonly known as:  FLOMAX  Take 1 capsule (0.4 mg total) by mouth daily after supper.     ticagrelor 90 MG Tabs tablet  Commonly known as:  BRILINTA  Take 1 tablet (90 mg total) by mouth 2 (two) times daily.           Follow-up Information    Follow up with Adrian Prows, MD On 07/17/2015.   Specialty:  Cardiology   Why:  at 2:00pm   Contact information:   [address] [place] [address] [phone]        Rachel Bo, NP-C 07/09/2015, 6:40 AM [facility] Pager: [phone] Office: [phone]

## 2015-07-09 NOTE — Progress Notes (Signed)
CARDIAC REHAB PHASE I   PRE:  Rate/Rhythm:50 SB  BP:  Lying: 115/44        SaO2: 96 RA  MODE:  Ambulation: 500 ft   POST:  Rate/Rhythm: 57 SB  BP:  Sitting: 119/51         SaO2: 99 RA  Pt ambulated 500 ft on RA, handheld assist, steady gait, tolerated well.  Pt denies DOE,  cp, dizziness, declined rest stop. Completed PCI/stent education with pt wife at bedside.  Reviewed risk factors, anti-platelet therapy, stent card, activity restrictions, ntg, exercise, heart healthy diet and phase 2 cardiac rehab. Pt verbalized understanding. Pt states he is to return for planned PCI next week, will send phase 2 referral at that time. Pt eager to discharge.  Pt to bed after walk, call bell within reach.   [phone]   Lenna Sciara, RN, BSN 07/09/2015 10:06 AM

## 2015-07-13 DIAGNOSIS — I2510 Atherosclerotic heart disease of native coronary artery without angina pectoris: Secondary | ICD-10-CM

## 2015-07-17 ENCOUNTER — Encounter (HOSPITAL_COMMUNITY)
Admission: RE | Disposition: A | Discharge status: Home / Self Care | Payer: Self-pay | Source: Ambulatory Visit | Attending: Cardiology

## 2015-07-17 ENCOUNTER — Encounter (HOSPITAL_COMMUNITY): Payer: Self-pay | Admitting: General Practice

## 2015-07-17 ENCOUNTER — Ambulatory Visit (HOSPITAL_COMMUNITY)
Admission: RE | Admit: 2015-07-17 | Discharge: 2015-07-18 | Disposition: A | Discharge status: Home / Self Care | Payer: Medicare Other | Source: Ambulatory Visit | Attending: Cardiology | Admitting: Cardiology

## 2015-07-17 DIAGNOSIS — Z79899 Other long term (current) drug therapy: Secondary | ICD-10-CM | POA: Diagnosis not present

## 2015-07-17 DIAGNOSIS — Z955 Presence of coronary angioplasty implant and graft: Secondary | ICD-10-CM

## 2015-07-17 DIAGNOSIS — I2510 Atherosclerotic heart disease of native coronary artery without angina pectoris: Secondary | ICD-10-CM

## 2015-07-17 DIAGNOSIS — I25119 Atherosclerotic heart disease of native coronary artery with unspecified angina pectoris: Secondary | ICD-10-CM | POA: Insufficient documentation

## 2015-07-17 DIAGNOSIS — E7800 Pure hypercholesterolemia, unspecified: Secondary | ICD-10-CM | POA: Diagnosis not present

## 2015-07-17 DIAGNOSIS — Z9861 Coronary angioplasty status: Secondary | ICD-10-CM

## 2015-07-17 HISTORY — PX: CARDIAC CATHETERIZATION: SHX172

## 2015-07-17 HISTORY — PX: PTCA: SHX146

## 2015-07-17 LAB — PROTIME-INR
INR: 1.02 (ref 0.00–1.49)
Prothrombin Time: 13.6 seconds (ref 11.6–15.2)

## 2015-07-17 LAB — GLUCOSE, CAPILLARY: Glucose-Capillary: 108 mg/dL — ABNORMAL HIGH (ref 65–99)

## 2015-07-17 LAB — POCT ACTIVATED CLOTTING TIME: Activated Clotting Time: 319 seconds

## 2015-07-17 SURGERY — CORONARY STENT INTERVENTION

## 2015-07-17 MED ORDER — SODIUM CHLORIDE 0.9 % IV SOLN: 250.0000 mL | INTRAVENOUS | Status: DC | PRN

## 2015-07-17 MED ORDER — BIVALIRUDIN BOLUS VIA INFUSION - CUPID
INTRAVENOUS | Status: DC | PRN
  Administered 2015-07-17: 61.2 mg via INTRAVENOUS

## 2015-07-17 MED ORDER — HYDROMORPHONE HCL 1 MG/ML IJ SOLN
INTRAMUSCULAR | Status: AC
  Filled 2015-07-17: qty 1

## 2015-07-17 MED ORDER — ACETAMINOPHEN 325 MG PO TABS: 650.0000 mg | ORAL_TABLET | ORAL | Status: DC | PRN

## 2015-07-17 MED ORDER — HEPARIN (PORCINE) IN NACL 2-0.9 UNIT/ML-% IJ SOLN
INTRAMUSCULAR | Status: AC
  Filled 2015-07-17: qty 1000

## 2015-07-17 MED ORDER — BIVALIRUDIN 250 MG IV SOLR
INTRAVENOUS | Status: AC
  Filled 2015-07-17: qty 250

## 2015-07-17 MED ORDER — VERAPAMIL HCL 2.5 MG/ML IV SOLN
INTRAVENOUS | Status: AC
Start: 2015-07-17 — End: 2015-07-17
  Filled 2015-07-17: qty 2

## 2015-07-17 MED ORDER — TAMSULOSIN HCL 0.4 MG PO CAPS
0.4000 mg | ORAL_CAPSULE | Freq: Every day | ORAL | Status: DC
  Administered 2015-07-17: 17:00:00 0.4 mg via ORAL
  Filled 2015-07-17: qty 1

## 2015-07-17 MED ORDER — VERAPAMIL HCL 2.5 MG/ML IV SOLN
INTRA_ARTERIAL | Status: DC | PRN
  Administered 2015-07-17: 10 mL via INTRA_ARTERIAL

## 2015-07-17 MED ORDER — NITROGLYCERIN 1 MG/10 ML FOR IR/CATH LAB
INTRA_ARTERIAL | Status: AC
  Filled 2015-07-17: qty 10

## 2015-07-17 MED ORDER — ASPIRIN 81 MG PO CHEW: 81.0000 mg | CHEWABLE_TABLET | ORAL | Status: DC

## 2015-07-17 MED ORDER — ANGIOPLASTY BOOK
Freq: Once | Status: AC
  Administered 2015-07-17: 22:00:00
  Filled 2015-07-17: qty 1

## 2015-07-17 MED ORDER — HYDROMORPHONE HCL 1 MG/ML IJ SOLN
INTRAMUSCULAR | Status: DC | PRN
  Administered 2015-07-17 (×2): 0.5 mg via INTRAVENOUS

## 2015-07-17 MED ORDER — ASPIRIN 81 MG PO CHEW
81.0000 mg | CHEWABLE_TABLET | Freq: Every day | ORAL | Status: DC
  Administered 2015-07-18: 81 mg via ORAL
  Filled 2015-07-17: qty 1

## 2015-07-17 MED ORDER — SODIUM CHLORIDE 0.9 % IJ SOLN: 3.0000 mL | INTRAMUSCULAR | Status: DC | PRN

## 2015-07-17 MED ORDER — SODIUM CHLORIDE 0.9 % IV SOLN
250.0000 mg | INTRAVENOUS | Status: DC | PRN
  Administered 2015-07-17: 1.75 mg/kg/h via INTRAVENOUS
  Administered 2015-07-17: 250 mg

## 2015-07-17 MED ORDER — TICAGRELOR 90 MG PO TABS: 90.0000 mg | ORAL_TABLET | Freq: Once | ORAL | Status: DC

## 2015-07-17 MED ORDER — SODIUM CHLORIDE 0.9 % WEIGHT BASED INFUSION
3.0000 mL/kg/h | INTRAVENOUS | Status: DC
  Administered 2015-07-17: 3 mL/kg/h via INTRAVENOUS

## 2015-07-17 MED ORDER — LIDOCAINE HCL (PF) 1 % IJ SOLN
INTRAMUSCULAR | Status: AC
  Filled 2015-07-17: qty 30

## 2015-07-17 MED ORDER — SODIUM CHLORIDE 0.9 % WEIGHT BASED INFUSION: 1.0000 mL/kg/h | INTRAVENOUS | Status: DC

## 2015-07-17 MED ORDER — SODIUM CHLORIDE 0.9 % IV SOLN: INTRAVENOUS | Status: DC | PRN

## 2015-07-17 MED ORDER — ATORVASTATIN CALCIUM 80 MG PO TABS
80.0000 mg | ORAL_TABLET | Freq: Every day | ORAL | Status: DC
  Administered 2015-07-17: 17:00:00 80 mg via ORAL
  Filled 2015-07-17 (×2): qty 1

## 2015-07-17 MED ORDER — TICAGRELOR 90 MG PO TABS
90.0000 mg | ORAL_TABLET | Freq: Two times a day (BID) | ORAL | Status: DC
  Administered 2015-07-17 – 2015-07-18 (×2): 90 mg via ORAL
  Filled 2015-07-17 (×2): qty 1

## 2015-07-17 MED ORDER — MIDAZOLAM HCL 2 MG/2ML IJ SOLN
INTRAMUSCULAR | Status: AC
  Filled 2015-07-17: qty 4

## 2015-07-17 MED ORDER — NITROGLYCERIN 0.4 MG SL SUBL: 0.4000 mg | SUBLINGUAL_TABLET | SUBLINGUAL | Status: DC | PRN

## 2015-07-17 MED ORDER — SODIUM CHLORIDE 0.9 % IJ SOLN
3.0000 mL | Freq: Two times a day (BID) | INTRAMUSCULAR | Status: DC
  Administered 2015-07-17 (×2): 3 mL via INTRAVENOUS

## 2015-07-17 MED ORDER — SODIUM CHLORIDE 0.9 % IJ SOLN: 3.0000 mL | Freq: Two times a day (BID) | INTRAMUSCULAR | Status: DC

## 2015-07-17 MED ORDER — MIDAZOLAM HCL 2 MG/2ML IJ SOLN
INTRAMUSCULAR | Status: DC | PRN
  Administered 2015-07-17: 2 mg via INTRAVENOUS
  Administered 2015-07-17 (×2): 1 mg via INTRAVENOUS

## 2015-07-17 MED ORDER — SODIUM CHLORIDE 0.9 % WEIGHT BASED INFUSION: 3.0000 mL/kg/h | INTRAVENOUS | Status: AC

## 2015-07-17 MED ORDER — ONDANSETRON HCL 4 MG/2ML IJ SOLN: 4.0000 mg | Freq: Four times a day (QID) | INTRAMUSCULAR | Status: DC | PRN

## 2015-07-17 SURGICAL SUPPLY — 30 items
BALLN ANGIOSCULPT RX 3.0X10 (BALLOONS) ×2
BALLN EUPHORA RX 1.5X6 (BALLOONS) ×2
BALLN MINITREK RX 1.20X20 (BALLOONS) ×2 IMPLANT
BALLN ~~LOC~~ EUPHORA RX 3.0X6 (BALLOONS) ×2
BALLN ~~LOC~~ TREK RX 2.0X6 (BALLOONS) ×2 IMPLANT
BALLN ~~LOC~~ TREK RX 3.0X12 (BALLOONS) ×2
BALLN ~~LOC~~ TREK RX 3.0X15 (BALLOONS) ×2
BALLOON ANGIOSCULPT RX 3.0X10 (BALLOONS) ×1 IMPLANT
BALLOON EUPHORA RX 1.5X6 (BALLOONS) ×1 IMPLANT
BALLOON ~~LOC~~ EUPHORA RX 3.0X6 (BALLOONS) ×1 IMPLANT
BALLOON ~~LOC~~ TREK RX 3.0X12 (BALLOONS) ×1 IMPLANT
BALLOON ~~LOC~~ TREK RX 3.0X15 (BALLOONS) ×1 IMPLANT
CATH SUPERCROSS ANGLED 90 DEG (MICROCATHETER) ×2 IMPLANT
CATH VISTA GUIDE 6FR XB3.5 (CATHETERS) ×2 IMPLANT
DEVICE RAD COMP TR BAND LRG (VASCULAR PRODUCTS) ×2 IMPLANT
GLIDESHEATH SLEND A-KIT 6F 20G (SHEATH) ×2 IMPLANT
KIT ENCORE 26 ADVANTAGE (KITS) ×4 IMPLANT
KIT ESSENTIALS PG (KITS) ×2 IMPLANT
KIT HEART LEFT (KITS) ×2 IMPLANT
PACK CARDIAC CATHETERIZATION (CUSTOM PROCEDURE TRAY) ×2 IMPLANT
STENT SYNERGY DES 3X24 (Permanent Stent) ×2 IMPLANT
STENT SYNERGY DES 3X28 (Permanent Stent) ×2 IMPLANT
TRANSDUCER W/STOPCOCK (MISCELLANEOUS) ×2 IMPLANT
TUBING CIL FLEX 10 FLL-RA (TUBING) ×2 IMPLANT
WIRE ASAHI FIELDER XT 190CM (WIRE) ×2 IMPLANT
WIRE ASAHI PROWATER 180CM (WIRE) ×2 IMPLANT
WIRE COUGAR XT STRL 190CM (WIRE) ×2 IMPLANT
WIRE HI TORQ WHISPER MS 190CM (WIRE) ×2 IMPLANT
WIRE MAILMAN 182CM (WIRE) ×4 IMPLANT
WIRE SAFE-T 1.5MM-J .035X260CM (WIRE) ×2 IMPLANT

## 2015-07-17 NOTE — Interval H&P Note (Signed)
History and Physical Interval Note:  07/17/2015 6:16 AM  Stanley Boyd  has presented today for surgery, with the diagnosis of cp  The various methods of treatment have been discussed with the patient and family. After consideration of risks, benefits and other options for treatment, the patient has consented to  Procedure(s): Coronary Stent Intervention (N/A) as a surgical intervention .  The patient's history has been reviewed, patient examined, no change in status, stable for surgery.  I have reviewed the patient's chart and labs.  Questions were answered to the patient's satisfaction.   Cath Lab Visit (complete for each Cath Lab visit)  Clinical Evaluation Leading to the Procedure:   ACS: No.  Non-ACS:    Anginal Classification: CCS III  Anti-ischemic medical therapy: Minimal Therapy (1 class of medications)  Non-Invasive Test Results: No non-invasive testing performed  Prior CABG: No previous CABG   This has been previously done and documented previously. This is scheduled staged procedure.        Adrian Prows

## 2015-07-17 NOTE — Progress Notes (Signed)
TR BAND REMOVAL  LOCATION:    right radial  DEFLATED PER PROTOCOL:    Yes.    TIME BAND OFF / DRESSING APPLIED:    1400   SITE UPON ARRIVAL:    Level 0  SITE AFTER BAND REMOVAL:    Level 0  REVERSE ALLEN'S TEST:     positive  CIRCULATION SENSATION AND MOVEMENT:    Within Normal Limits   Yes.    COMMENTS:   Tolerated procedure well 

## 2015-07-17 NOTE — H&P (View-Only) (Signed)
OFFICE VISIT NOTES COPIED TO EPIC FOR DOCUMENTATION   Stanley Boyd 07-05-2015 9:38 AM Location: [facility] Patient #: [id number] DOB: [birth year] Married / Language: Stanley Boyd / Race: White Male   History of Present Illness Stanley Boyd; July 05, 2015 11:28 AM) Patient words: NP EVAL for fatigue, exercise intolerance; Pt states that he has had cold sweats, nauseated, with any exertion pt begins to be exhausted. Pt states he has had symptoms for 3 months or so. Pt does see a Pulmonologist. Pt has history of pneumonia. Pt had EKG done on Friday, and it is in EPIC.  The patient is a 64 year old male who presents for syncope. He had been doing well until sometime in November 2015, had developed pneumonia and was hospitalized for a week. Since then he has not felt well, felt well sometime in February but beginning of March he started to feel marked fatigue, also near syncopal spells with exertional activity. He feels warmth all over the body, followed by marked diaphoresis and feeling he is going to pass out and had to sit down and rest and feels well after resting and he is able to get back to his usual activities. However in the recent few weeks, even minimal activity has brought on symptoms and he has been extremely scared to do any activity. He denies any specific chest pain or shortness of breath. No symptoms to suggest TIA or claudication. No headache or visual disturbances. No recent weight changes.   Problem List/Past Medical Stanley Boyd; 07/05/2015 9:52 AM) Hypercholesterolemia (E78.0) BPH (benign prostatic hyperplasia) (N40.0) History of pneumonia (Z87.01)  Allergies (Stanley Boyd; Jul 05, 2015 0:93 AM) Aspirin (Salicylates) lip swelling.  Family History Stanley Boyd; July 05, 2015 9:48 AM) Mother Deceased. at age 19, from ALzheimers. No known Heart Conditions Father Deceased. in his 87's, from [place]. No known Heart Conditions Sister 2 1 older, 1 younger;  Both Hx of DM Brother 1 older; Known Heart murmur. Hx of Stroke in early 51's. Hx of Stent  Social History Stanley Boyd; 07-05-15 9:48 AM) Current tobacco use Never smoker. Non Drinker/No Alcohol Use Marital status Married. Number of Children 2. Living Situation Lives with spouse.  Past Surgical History Stanley Boyd; 2015/07/05 9:49 AM) Cervical Fusion 2000 1985, 1996 Back Surgery 1984  Medication History Stanley Boyd; 07/05/2015 9:50 AM) Tamsulosin HCl (0.4MG  Capsule, 1 Oral daily) Active. Atorvastatin Calcium (80MG  Tablet, 1 Oral daily) Active. Amoxicillin (500MG  Capsule, 1 Oral prior to dental procedure) Active.  Diagnostic Studies History Stanley Boyd; July 05, 2015 9:51 AM) Colonoscopy2012    Review of Systems Stanley Boyd; 2015/07/05 11:26 AM) General Present- Fatigue and Feeling well. Not Present- Fever and Night Sweats. Skin Not Present- Itching and Rash. HEENT Not Present- Headache. Respiratory Not Present- Difficulty Breathing. Cardiovascular Not Present- Claudications, Fainting, Orthopnea and Swelling of Extremities. Gastrointestinal Not Present- Abdominal Pain, Constipation, Diarrhea, Nausea and Vomiting. Musculoskeletal Not Present- Joint Swelling. Neurological Present- Dysesthesia (Right side of the body with mild is just a and also mild weakness since spinal cord injury years ago.), Fainting and Weakness In Extremities (Right side of the body with mild is just a and also mild weakness since spinal cord injury years ago.). Not Present- Headaches, Spinning Sensation and Tremor. Hematology Not Present- Blood Clots, Easy Bruising and Nose Bleed.  Vitals Stanley Boyd; 2015/07/05 9:54 AM) Jul 05, 2015 9:44 AM Weight: 188.56 lb Height: 69in Body Surface Area: 2.01 m Body Mass Index: 27.85 kg/m  Pulse: 49 (Regular)  P.OX: 98% (Room air) BP: 108/68 (Sitting,  Left Arm, Standard)       Physical Exam Stanley Boyd;  06/30/2015 11:25 AM) General Mental Status-Alert. General Appearance-Cooperative, Appears stated age, Not in acute distress. Orientation-Oriented X3. Build & Nutrition-Well built.  Head and Neck Thyroid Gland Characteristics - no palpable nodules, no palpable enlargement.  Chest and Lung Exam Palpation Tender - No chest wall tenderness. Auscultation Breath sounds - Clear.  Cardiovascular Inspection Jugular vein - Right - No Distention. Auscultation Heart Sounds - S1 WNL, S2 WNL and No gallop present. Murmurs & Other Heart Sounds - Murmur - No murmur.  Abdomen Palpation/Percussion Palpation and Percussion of the abdomen reveal - Non Tender and No hepatosplenomegaly. Auscultation Auscultation of the abdomen reveals - Bowel sounds normal.  Peripheral Vascular Lower Extremity Inspection - Left - No Pigmentation, No Varicose veins. Right - No Pigmentation, No Varicose veins. Palpation - Edema - Left - No edema. Right - No edema. Femoral pulse - Left - Normal. Right - Normal. Popliteal pulse - Left - Normal. Right - Normal. Dorsalis pedis pulse - Left - Normal. Right - Normal. Posterior tibial pulse - Left - Normal. Right - Normal. Carotid arteries - Left-No Carotid bruit. Carotid arteries - Right-No Carotid bruit. Abdomen-No prominent abdominal aortic pulsation, No epigastric bruit.  Neurologic Motor-Grossly intact without any focal deficits.  Musculoskeletal Global Assessment Left Lower Extremity - normal range of motion without pain. Right Lower Extremity - normal range of motion without pain.    Assessment & Plan Stanley Boyd; 06/30/2015 5:59 PM) Near syncope (R55) Story: EKG 06/26/15: Marked sinus bradycardia at rate of 49 bpm, otherwise normal EKG.  Echocardiogram 09/06/2014: Normal LV systolic function, borderline LVH, EF 60-65%. Mild mitral and tricuspid regurgitation, unable to estimate PA pressure. Current Plans Started Aspirin Adult Low  Strength 81MG , 1 (one) Tablet Chewable daily, #360, 06/30/2015, No Refill. Event Monitor (81017) METABOLIC PANEL, BASIC (51025) CBC & PLATELETS (AUTO) (85027) PT (PROTHROMBIN TIME) (85277) Hypercholesterolemia (E78.0) Story: Labs 06/24/2015: total cholesterol 305, triglycerides 235, HDL 38, LDL direct 227, TSH 1.92, CMP normal, CBC normal Current Plans Mechanism of underlying disease process and action of medications discussed with the patient. I discussed primary/secondary prevention and also dietary counceling was done. Patient presenting with episodes of near-syncope with exertion, each episode occurring always with exertion activity, has a sense of impending doom. 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  Long discussion regarding proceeding directly with coronary angiography versus stress testing. Patient would like to proceed directly with coronary angiography which I have agreed upon. Although he has no other cardiovascular risk, new onset of exertional near syncope associated with markedly elevated lipids places him at ahigh risk for cardiac etiology. On discussions held regarding stress testing, patient states that he is extremely scared about being on the treadmill as he feels that he will pass out. He prefers angiography. Schedule for cardiac catheterization, and possible angioplasty. We discussed regarding risks, benefits, alternatives to this including stress testing, CTA and continued medical therapy. Patient wants to proceed. Understands <1-2% risk of death, stroke, MI, urgent CABG, bleeding, infection, renal failure but not limited to these. Video recording of the procedure shown to the patient.  I'll also perform an event for 30 days to evaluate his symptoms. Office visit after the test. Patient advised to start aspirin 81 mg by mouth daily for primary prevention. I'm unable to use any beta blockers due to marked sinus bradycardia. Blood pressure also very  low. Presently on maximum dose of statin that  was started on week ago.    Signed by Stanley Page, Boyd (06/30/2015 5:59 PM)

## 2015-07-18 ENCOUNTER — Encounter (HOSPITAL_COMMUNITY): Payer: Self-pay | Admitting: Cardiology

## 2015-07-18 DIAGNOSIS — Z79899 Other long term (current) drug therapy: Secondary | ICD-10-CM | POA: Diagnosis not present

## 2015-07-18 DIAGNOSIS — I25119 Atherosclerotic heart disease of native coronary artery with unspecified angina pectoris: Secondary | ICD-10-CM | POA: Diagnosis not present

## 2015-07-18 DIAGNOSIS — I2510 Atherosclerotic heart disease of native coronary artery without angina pectoris: Secondary | ICD-10-CM | POA: Diagnosis not present

## 2015-07-18 DIAGNOSIS — E7800 Pure hypercholesterolemia, unspecified: Secondary | ICD-10-CM | POA: Diagnosis not present

## 2015-07-18 LAB — BASIC METABOLIC PANEL WITH GFR
Anion gap: 9 (ref 5–15)
BUN: 13 mg/dL (ref 6–20)
CO2: 25 mmol/L (ref 22–32)
Calcium: 9.1 mg/dL (ref 8.9–10.3)
Chloride: 104 mmol/L (ref 101–111)
Creatinine, Ser: 1.08 mg/dL (ref 0.61–1.24)
GFR calc Af Amer: >60 mL/min
GFR calc non Af Amer: >60 mL/min
Glucose, Bld: 110 mg/dL — ABNORMAL HIGH (ref 65–99)
Potassium: 4.2 mmol/L (ref 3.5–5.1)
Sodium: 138 mmol/L (ref 135–145)

## 2015-07-18 LAB — CBC
HCT: 38.6 % — ABNORMAL LOW (ref 39.0–52.0)
Hemoglobin: 13.2 g/dL (ref 13.0–17.0)
MCH: 31.2 pg (ref 26.0–34.0)
MCHC: 34.2 g/dL (ref 30.0–36.0)
MCV: 91.3 fL (ref 78.0–100.0)
Platelets: 215 K/uL (ref 150–400)
RBC: 4.23 MIL/uL (ref 4.22–5.81)
RDW: 12.5 % (ref 11.5–15.5)
WBC: 8.3 K/uL (ref 4.0–10.5)

## 2015-07-18 MED ORDER — TICAGRELOR 90 MG PO TABS: 90.0000 mg | ORAL_TABLET | Freq: Two times a day (BID) | ORAL | Status: DC

## 2015-07-18 NOTE — Progress Notes (Signed)
CARDIAC REHAB PHASE I   PRE:  Rate/Rhythm: 48 SB    BP: sitting 95/47    SaO2:   MODE:  Ambulation: 700 ft   POST:  Rate/Rhythm: 77 SR with PVC    BP: sitting 111/56     SaO2:   Tolerated well. Ed reviewed. Will send referral to [facility]  [place], [place], ACSM 07/18/2015 8:46 AM

## 2015-07-18 NOTE — Discharge Summary (Signed)
Physician Discharge Summary  Patient ID: Stanley Boyd MRN: [id number] DOB/AGE: [birth year] 64 y.o.  Admit date: 07/17/2015 Discharge date: 07/18/2015  Primary Discharge Diagnosis: CAD with angina  Secondary Discharge Diagnosis: Hyperlipidemia Bradycardial and low BP asymptomatic  Significant Diagnostic Studies: 07/17/2015:  Successful PTCA and stenting of the bifurcating large OM1 and proximal circumflex with implantation of 3.0 x 28 mm in the circumflex and 3.0 x 24 mm Synergy DES with [place] Technique, The ostium of the circumflex was covered with 2 layers of stent. Stenosis reduced from 90% both vessels to 0% in both vessels with maintenance of TIMI-3 to TIMI-3 flow at the end of the procedure. No immediate complication. No immediate complications. 145 CC contrast used.  Prox and mid LAD 3.0x38 & 3.0x59mm Resolute DES placed on 07/08/2015 widely patent.   Hospital Course:  The patient is a 64 year old male who had presented to the office for syncope. He had been doing well until sometime in November 2015, had developed pneumonia and was hospitalized for a week. Since then he had not felt well, with marked fatigue, also near syncopal spells with exertional activity. He would feel warmth all over the body, followed by marked diaphoresis and feeling he is going to pass out and had to sit down and rest and feels well after resting and he is able to get back to his usual activities. However, in the recent few weeks, even minimal activity has brought on symptoms and he has been extremely scared to do any activity. He denies any specific chest pain or shortness of breath.   Due to exertional syncope, sense of impending doom, and significant hyperlipidemia, likely familial, he was scheduled for coronary angiography to evaluate for CAD with revealed high-grade 80% long stenosis in the proximal LAD and mid LAD, distal lesion 90% and underwent successful PCI with 3.0 x 38 and 3.0 x 15 mm resolute DES,  stenosis reduced to 0%. Angiogram also revealed diagonal to ostial 80-90% stenosis, with brisk flow evident post PCI.   He had residual Proximal large Cx 90%, and large OM1 stenosis of 80-90% that needs staged PCI. He was scheduled for elective angioplasty, underwent successful angioplasty with implantation of 2 drug-eluting stents, 3.0 x 28 and 3.0 x 24 mm Synergy stents. Following morning she was stable and ready for discharge.  Recommendations on discharge: Continue DAPT with ASA and Brilinta as well as high dose, high intensity statin, and BB was discontinued as patient complained of marked fatigue and low blood pressure and dizziness. His heart rate continued to be in 3842 beats a minute with systolic blood pressure of 90-95 mmHg. Follow up outpatient next week.  Discharge Exam: Blood pressure 101/51, pulse 61, temperature 98.6 F (37 C), temperature source Oral, resp. rate 19, height 5\' 9"  (1.753 m), weight 84.5 kg (186 lb 4.6 oz), SpO2 96 %.   General appearance: alert, cooperative, appears stated age and no distress Neck: no adenopathy, no carotid bruit, no JVD, supple, symmetrical, trachea midline and thyroid not enlarged, symmetric, no tenderness/mass/nodules Resp: clear to auscultation bilaterally Chest wall: no tenderness Cardio: regular rate and rhythm, S1, S2 normal, no murmur, click, rub or gallop Extremities: extremities normal, atraumatic, no cyanosis or edema Pulses: 2+ and symmetric, right radial access site asymptomatic   Labs:   Lab Results  Component Value Date   WBC 8.3 07/18/2015   HGB 13.2 07/18/2015   HCT 38.6* 07/18/2015   MCV 91.3 07/18/2015   PLT 215 07/18/2015  Recent Labs Lab 07/18/15 0342  NA 138  K 4.2  CL 104  CO2 25  BUN 13  CREATININE 1.08  CALCIUM 9.1  GLUCOSE 110*    Lipid Panel     Component Value Date/Time   CHOL 305* 06/24/2015 0914   TRIG 235.0* 06/24/2015 0914   HDL 38.40* 06/24/2015 0914   CHOLHDL 8 06/24/2015 0914    VLDL 47.0* 06/24/2015 0914   LDLCALC 239* 11/07/2014 1042    EKG: unchanged from previous tracings, marks aspirin cardiac rate of 42 beats a minute without evidence of ischemia.  FOLLOW UP PLANS AND APPOINTMENTS    Medication List    STOP taking these medications        carvedilol 3.125 MG tablet  Commonly known as:  COREG      TAKE these medications        aspirin 81 MG chewable tablet  Chew 1 tablet (81 mg total) by mouth daily.     atorvastatin 80 MG tablet  Commonly known as:  LIPITOR  Take 1 tablet (80 mg total) by mouth daily.     nitroGLYCERIN 0.4 MG SL tablet  Commonly known as:  NITROSTAT  Place 1 tablet (0.4 mg total) under the tongue every 5 (five) minutes as needed for chest pain.     tamsulosin 0.4 MG Caps capsule  Commonly known as:  FLOMAX  Take 1 capsule (0.4 mg total) by mouth daily after supper.     ticagrelor 90 MG Tabs tablet  Commonly known as:  BRILINTA  Take 1 tablet (90 mg total) by mouth 2 (two) times daily.           Follow-up Information    Follow up with Adrian Prows, MD.   Specialty:  Cardiology   Why:  Keep previous appointment   Contact information:   [place] [place] [address] [phone]        Adrian Prows, MD 07/18/2015, 7:47 AM  Pager: [phone] Office: [phone] If no answer: [phone]

## 2015-09-26 ENCOUNTER — Encounter: Payer: Self-pay | Admitting: Family Medicine

## 2015-09-26 ENCOUNTER — Ambulatory Visit (INDEPENDENT_AMBULATORY_CARE_PROVIDER_SITE_OTHER): Payer: Medicare Other | Admitting: Family Medicine

## 2015-09-26 VITALS — BP 100/60 | HR 47 | Temp 98.0°F | Wt 181.0 lb

## 2015-09-26 DIAGNOSIS — N400 Enlarged prostate without lower urinary tract symptoms: Secondary | ICD-10-CM | POA: Diagnosis not present

## 2015-09-26 DIAGNOSIS — J189 Pneumonia, unspecified organism: Secondary | ICD-10-CM

## 2015-09-26 DIAGNOSIS — I25118 Atherosclerotic heart disease of native coronary artery with other forms of angina pectoris: Secondary | ICD-10-CM | POA: Diagnosis not present

## 2015-09-26 DIAGNOSIS — R740 Nonspecific elevation of levels of transaminase and lactic acid dehydrogenase [LDH]: Secondary | ICD-10-CM | POA: Diagnosis not present

## 2015-09-26 DIAGNOSIS — R972 Elevated prostate specific antigen [PSA]: Secondary | ICD-10-CM

## 2015-09-26 DIAGNOSIS — R7401 Elevation of levels of liver transaminase levels: Secondary | ICD-10-CM

## 2015-09-26 DIAGNOSIS — E785 Hyperlipidemia, unspecified: Secondary | ICD-10-CM

## 2015-09-26 LAB — COMPREHENSIVE METABOLIC PANEL
ALT: 58 U/L — ABNORMAL HIGH (ref 0–53)
AST: 29 U/L (ref 0–37)
Albumin: 4.4 g/dL (ref 3.5–5.2)
Alkaline Phosphatase: 98 U/L (ref 39–117)
BUN: 16 mg/dL (ref 6–23)
CO2: 27 mEq/L (ref 19–32)
Calcium: 9.8 mg/dL (ref 8.4–10.5)
Chloride: 103 mEq/L (ref 96–112)
Creatinine, Ser: 1.03 mg/dL (ref 0.40–1.50)
GFR: 77.66 mL/min (ref >60.00)
Glucose, Bld: 98 mg/dL (ref 70–99)
Potassium: 4.5 mEq/L (ref 3.5–5.1)
Sodium: 138 mEq/L (ref 135–145)
Total Bilirubin: 0.7 mg/dL (ref 0.2–1.2)
Total Protein: 7.8 g/dL (ref 6.0–8.3)

## 2015-09-26 LAB — LDL CHOLESTEROL, DIRECT: Direct LDL: 60 mg/dL

## 2015-09-26 NOTE — Patient Instructions (Addendum)
Labs before you leave  Hopeful bad cholesterol is at least cut in half.   Follow up with cardiology in January  I will touch base with Jayce when I get your labs back and we will discuss any changes as well as talking to cardiology if need to further reduce cholesterol  Thanks for getting first pneumonia shot. We will give you Prevnar 48 at age 64 (or can get at health department at that time). THen repeat pneumovax (one you got at [facility]) in 2021

## 2015-09-26 NOTE — Assessment & Plan Note (Signed)
S: Patient is asymptomatic at this point. Exercise intolerance has resolve s/p 4 stents. Compliant with statin, aspirin, brilinta. Cannot tolerate beta blocker due to bradycardia A/P: continue current medication. Following with cardiology in January. May need to push lipids lower.

## 2015-09-26 NOTE — Assessment & Plan Note (Signed)
S: flow remains reasonable on flomax. No retention A/P: continue current rx

## 2015-09-26 NOTE — Assessment & Plan Note (Signed)
S: suspect improved controlled on atorvastatin 80mg . No myalgias.  Lab Results  Component Value Date   CHOL 305* 06/24/2015   HDL 38.40* 06/24/2015   LDLCALC 239* 11/07/2014   LDLDIRECT 227.0 06/24/2015   TRIG 235.0* 06/24/2015   CHOLHDL 8 06/24/2015   A/P: update lipids today. Goal LDL <70 but with statin 50% reduction would only be 120 or so. We will need to consider zetia vs. psk9 inhibitors potentially but would discuss with patient's son as well as cardiology likely.

## 2015-09-26 NOTE — Assessment & Plan Note (Signed)
S: AST after CAP hospitalization7 8, ALT 82, alk phos 152. Repeat values 11/07/14 normalized.  A/P: repeat LFTs again today

## 2015-09-26 NOTE — Progress Notes (Addendum)
Garret Reddish, MD  Subjective:  Stanley Boyd is a 65 y.o. year old very pleasant male patient who presents for/with See problem oriented charting ROS- No chest pain or shortness of breath. No headache or blurry vision. No exercise intolerance due to fatigue  Past Medical History-  Patient Active Problem List   Diagnosis Date Noted  . CAD (coronary artery disease), native coronary artery 07/13/2015    Priority: High  . S/P PTCA (percutaneous transluminal coronary angioplasty) 07/08/2015    Priority: High  . Hemiparesis ([place]) 09/18/2014    Priority: Medium  . BPH (benign prostatic hyperplasia) 09/18/2014    Priority: Medium  . Hyperlipidemia     Priority: Medium  . Bradycardia     Priority: Medium  . History of colonic polyps 10/15/2014    Priority: Low  . CAP (community acquired pneumonia) 09/18/2014    Priority: Low  . Transaminitis 09/18/2014    Priority: Low    Medications- reviewed and updated Current Outpatient Prescriptions  Medication Sig Dispense Refill  . aspirin 81 MG chewable tablet Chew 1 tablet (81 mg total) by mouth daily. 30 tablet 0  . atorvastatin (LIPITOR) 80 MG tablet Take 1 tablet (80 mg total) by mouth daily. 30 tablet 11  . tamsulosin (FLOMAX) 0.4 MG CAPS capsule Take 1 capsule (0.4 mg total) by mouth daily after supper. 30 capsule 11  . ticagrelor (BRILINTA) 90 MG TABS tablet Take 1 tablet (90 mg total) by mouth 2 (two) times daily. 60 tablet 1  . nitroGLYCERIN (NITROSTAT) 0.4 MG SL tablet Place 1 tablet (0.4 mg total) under the tongue every 5 (five) minutes as needed for chest pain. (Patient not taking: Reported on 09/26/2015) 25 tablet 4   No current facility-administered medications for this visit.    Objective: BP 100/60 mmHg  Pulse 47  Temp(Src) 98 F (36.7 C)  Wt 181 lb (82.101 kg) Gen: NAD, resting comfortably CV: RRR no murmurs rubs or gallops Lungs: CTAB no crackles, wheeze, rhonchi Abdomen: soft/nontender/nondistended/normal bowel  sounds. No rebound or guarding.  Ext: no edema Skin: warm, dry Neuro: grossly normal, moves all extremities  Assessment/Plan:  Hyperlipidemia S: suspect improved controlled on atorvastatin 29m. No myalgias.  Lab Results  Component Value Date   CHOL 305* 06/24/2015   HDL 38.40* 06/24/2015   LDLCALC 239* 11/07/2014   LDLDIRECT 227.0 06/24/2015   TRIG 235.0* 06/24/2015   CHOLHDL 8 06/24/2015   A/P: update lipids today. Goal LDL <70 but with statin 50% reduction would only be 120 or so. We will need to consider zetia vs. psk9 inhibitors potentially but would discuss with patient's son as well as cardiology likely.     CAD (coronary artery disease), native coronary artery S: Patient is asymptomatic at this point. Exercise intolerance has resolve s/p 4 stents. Compliant with statin, aspirin, brilinta. Cannot tolerate beta blocker due to bradycardia A/P: continue current medication. Following with cardiology in January. May need to push lipids lower.    Transaminitis S: AST after CAP hospitalization7 8, ALT 82, alk phos 152. Repeat values 11/07/14 normalized.  A/P: repeat LFTs again today   BPH (benign prostatic hyperplasia) S: flow remains reasonable on flomax. No retention A/P: continue current rx    6 months follow up Return precautions advised.   Orders Placed This Encounter  Procedures  . LDL cholesterol, direct    [place]  . Comprehensive metabolic panel    [place]   Pneumovax given at [facility]

## 2015-09-29 NOTE — Addendum Note (Signed)
Addended by: Marin Olp on: 09/29/2015 10:29 AM   Modules accepted: Orders

## 2015-10-09 ENCOUNTER — Other Ambulatory Visit (INDEPENDENT_AMBULATORY_CARE_PROVIDER_SITE_OTHER): Payer: Medicare Other

## 2015-10-09 DIAGNOSIS — R740 Nonspecific elevation of levels of transaminase and lactic acid dehydrogenase [LDH]: Secondary | ICD-10-CM

## 2015-10-09 DIAGNOSIS — R7401 Elevation of levels of liver transaminase levels: Secondary | ICD-10-CM

## 2015-10-09 DIAGNOSIS — N400 Enlarged prostate without lower urinary tract symptoms: Secondary | ICD-10-CM

## 2015-10-09 LAB — HEPATIC FUNCTION PANEL
ALT: 75 U/L — ABNORMAL HIGH (ref 0–53)
AST: 37 U/L (ref 0–37)
Albumin: 4.4 g/dL (ref 3.5–5.2)
Alkaline Phosphatase: 100 U/L (ref 39–117)
Bilirubin, Direct: 0.1 mg/dL (ref 0.0–0.3)
Total Bilirubin: 0.7 mg/dL (ref 0.2–1.2)
Total Protein: 7.4 g/dL (ref 6.0–8.3)

## 2015-10-09 LAB — PSA: PSA: 4.45 ng/mL — ABNORMAL HIGH (ref 0.10–4.00)

## 2015-10-12 HISTORY — PX: BIOPSY PROSTATE: PRO28

## 2015-10-23 ENCOUNTER — Inpatient Hospital Stay: Admission: RE | Admit: 2015-10-23 | Payer: Medicare Other | Source: Ambulatory Visit

## 2015-10-23 ENCOUNTER — Encounter: Payer: Self-pay | Admitting: Family Medicine

## 2015-10-23 ENCOUNTER — Ambulatory Visit: Payer: Medicare Other | Admitting: Family Medicine

## 2015-10-24 ENCOUNTER — Ambulatory Visit: Payer: Medicare Other | Admitting: Family Medicine

## 2015-10-24 NOTE — Telephone Encounter (Signed)
I contacted patient. He states that he is feeling better again and fever has went away.  He states that he will call back if he feels like he needs to be seen.  Thanks!

## 2015-10-31 ENCOUNTER — Ambulatory Visit (INDEPENDENT_AMBULATORY_CARE_PROVIDER_SITE_OTHER)
Admission: RE | Admit: 2015-10-31 | Discharge: 2015-10-31 | Disposition: A | Discharge status: Home / Self Care | Payer: Medicare Other | Source: Ambulatory Visit | Attending: Emergency Medicine | Admitting: Emergency Medicine

## 2015-10-31 DIAGNOSIS — R938 Abnormal findings on diagnostic imaging of other specified body structures: Secondary | ICD-10-CM

## 2015-10-31 DIAGNOSIS — R9389 Abnormal findings on diagnostic imaging of other specified body structures: Secondary | ICD-10-CM

## 2015-10-31 MED ORDER — IOHEXOL 300 MG/ML  SOLN
80.0000 mL | Freq: Once | INTRAMUSCULAR | Status: AC | PRN
  Administered 2015-10-31: 80 mL via INTRAVENOUS

## 2015-11-01 LAB — HEPATIC FUNCTION PANEL
ALT: 59 U/L — AB (ref 10–40)
AST: 35 U/L (ref 14–40)
Alkaline Phosphatase: 130 U/L — AB (ref 25–125)
Bilirubin, Direct: 0.14 mg/dL (ref 0.01–0.4)

## 2015-11-01 LAB — PSA: PSA: 7.2

## 2015-11-04 ENCOUNTER — Encounter: Payer: Self-pay | Admitting: Family Medicine

## 2015-11-11 ENCOUNTER — Other Ambulatory Visit: Payer: Self-pay | Admitting: Urology

## 2015-11-11 DIAGNOSIS — R972 Elevated prostate specific antigen [PSA]: Secondary | ICD-10-CM

## 2015-11-26 ENCOUNTER — Other Ambulatory Visit (HOSPITAL_COMMUNITY): Payer: Medicare Other

## 2015-11-26 ENCOUNTER — Ambulatory Visit (HOSPITAL_COMMUNITY): Admission: RE | Admit: 2015-11-26 | Payer: Medicare Other | Source: Ambulatory Visit

## 2015-11-26 ENCOUNTER — Ambulatory Visit (HOSPITAL_COMMUNITY): Payer: Medicare Other

## 2015-12-05 ENCOUNTER — Ambulatory Visit: Payer: Medicare Other | Admitting: Emergency Medicine

## 2015-12-12 ENCOUNTER — Ambulatory Visit: Payer: Medicare Other | Admitting: Adult Health

## 2015-12-24 ENCOUNTER — Other Ambulatory Visit (HOSPITAL_COMMUNITY): Payer: Medicare Other

## 2016-03-26 ENCOUNTER — Ambulatory Visit: Payer: Medicare Other | Admitting: Family Medicine

## 2016-04-02 ENCOUNTER — Ambulatory Visit (INDEPENDENT_AMBULATORY_CARE_PROVIDER_SITE_OTHER): Payer: Medicare Other | Admitting: Family Medicine

## 2016-04-02 ENCOUNTER — Encounter: Payer: Self-pay | Admitting: Family Medicine

## 2016-04-02 VITALS — BP 114/70 | HR 45 | Temp 97.7°F | Ht 69.0 in | Wt 178.0 lb

## 2016-04-02 DIAGNOSIS — R740 Nonspecific elevation of levels of transaminase and lactic acid dehydrogenase [LDH]: Secondary | ICD-10-CM | POA: Diagnosis not present

## 2016-04-02 DIAGNOSIS — E785 Hyperlipidemia, unspecified: Secondary | ICD-10-CM | POA: Diagnosis not present

## 2016-04-02 DIAGNOSIS — R7401 Elevation of levels of liver transaminase levels: Secondary | ICD-10-CM

## 2016-04-02 DIAGNOSIS — R5383 Other fatigue: Secondary | ICD-10-CM

## 2016-04-02 DIAGNOSIS — I25118 Atherosclerotic heart disease of native coronary artery with other forms of angina pectoris: Secondary | ICD-10-CM

## 2016-04-02 LAB — CBC WITH DIFFERENTIAL/PLATELET
Basophils Absolute: 0 10*3/uL (ref 0.0–0.1)
Basophils Relative: 0.3 % (ref 0.0–3.0)
Eosinophils Absolute: 0.1 10*3/uL (ref 0.0–0.7)
Eosinophils Relative: 2.3 % (ref 0.0–5.0)
HCT: 42.4 % (ref 39.0–52.0)
Hemoglobin: 14.1 g/dL (ref 13.0–17.0)
Lymphocytes Relative: 33.7 % (ref 12.0–46.0)
Lymphs Abs: 2 10*3/uL (ref 0.7–4.0)
MCHC: 33.3 g/dL (ref 30.0–36.0)
MCV: 90.2 fl (ref 78.0–100.0)
Monocytes Absolute: 0.5 10*3/uL (ref 0.1–1.0)
Monocytes Relative: 8 % (ref 3.0–12.0)
Neutro Abs: 3.3 10*3/uL (ref 1.4–7.7)
Neutrophils Relative %: 55.7 % (ref 43.0–77.0)
Platelets: 195 10*3/uL (ref 150.0–400.0)
RBC: 4.7 Mil/uL (ref 4.22–5.81)
RDW: 15.1 % (ref 11.5–15.5)
WBC: 5.9 10*3/uL (ref 4.0–10.5)

## 2016-04-02 LAB — COMPREHENSIVE METABOLIC PANEL
ALT: 29 U/L (ref 0–53)
AST: 24 U/L (ref 0–37)
Albumin: 4.5 g/dL (ref 3.5–5.2)
Alkaline Phosphatase: 82 U/L (ref 39–117)
BUN: 15 mg/dL (ref 6–23)
CO2: 29 mEq/L (ref 19–32)
Calcium: 9.8 mg/dL (ref 8.4–10.5)
Chloride: 103 mEq/L (ref 96–112)
Creatinine, Ser: 0.93 mg/dL (ref 0.40–1.50)
GFR: 87.23 mL/min (ref >60.00)
Glucose, Bld: 97 mg/dL (ref 70–99)
Potassium: 4.5 mEq/L (ref 3.5–5.1)
Sodium: 138 mEq/L (ref 135–145)
Total Bilirubin: 0.7 mg/dL (ref 0.2–1.2)
Total Protein: 7.3 g/dL (ref 6.0–8.3)

## 2016-04-02 LAB — TSH: TSH: 2.21 u[IU]/mL (ref 0.35–4.50)

## 2016-04-02 LAB — TRIGLYCERIDES: Triglycerides: 57 mg/dL (ref 0.0–149.0)

## 2016-04-02 NOTE — Progress Notes (Signed)
Pre visit review using our clinic review tool, if applicable. No additional management support is needed unless otherwise documented below in the visit note. 

## 2016-04-02 NOTE — Progress Notes (Signed)
Subjective:  Stanley Boyd is a 65 y.o. year old very pleasant male patient who presents for/with See problem oriented charting ROS- denies chest pain or shortness of breath, does have some exertional fatigue and broke out in cold sweat a few weeks ago.see any ROS included in HPI as well.   Past Medical History-  Patient Active Problem List   Diagnosis Date Noted  . CAD (coronary artery disease), native coronary artery 07/13/2015    Priority: High  . S/P PTCA (percutaneous transluminal coronary angioplasty) 07/08/2015    Priority: High  . Hemiparesis ([place]) 09/18/2014    Priority: Medium  . BPH (benign prostatic hyperplasia) 09/18/2014    Priority: Medium  . Hyperlipidemia     Priority: Medium  . Bradycardia     Priority: Medium  . History of colonic polyps 10/15/2014    Priority: Low  . CAP (community acquired pneumonia) 09/18/2014    Priority: Low  . Transaminitis 09/18/2014    Priority: Low    Medications- reviewed and updated Current Outpatient Prescriptions  Medication Sig Dispense Refill  . aspirin 81 MG chewable tablet Chew 1 tablet (81 mg total) by mouth daily. 30 tablet 0  . atorvastatin (LIPITOR) 80 MG tablet Take 1 tablet (80 mg total) by mouth daily. 30 tablet 11  . nitroGLYCERIN (NITROSTAT) 0.4 MG SL tablet Place 1 tablet (0.4 mg total) under the tongue every 5 (five) minutes as needed for chest pain. (Patient not taking: Reported on 09/26/2015) 25 tablet 4  . tamsulosin (FLOMAX) 0.4 MG CAPS capsule Take 1 capsule (0.4 mg total) by mouth daily after supper. 30 capsule 11  . ticagrelor (BRILINTA) 90 MG TABS tablet Take 1 tablet (90 mg total) by mouth 2 (two) times daily. 60 tablet 1   No current facility-administered medications for this visit.    Objective: BP 114/70 mmHg  Pulse 45  Temp(Src) 97.7 F (36.5 C) (Oral)  Ht _0  (1.753 m)  Wt 178 lb (80.74 kg)  BMI 26.27 kg/m2  SpO2 96% Gen: NAD, resting comfortably No thyromegalhy CV: RRR no murmurs rubs  or gallops No chest wall pain Lungs: CTAB no crackles, wheeze, rhonchi Abdomen: soft/nontender/nondistended/normal bowel sounds. Ext: no edema Skin: warm, dry Neuro: grossly normal, moves all extremities  Assessment/Plan:  Hyperlipidemia S:  controlled on atorvastatin 55m with LDL down to 60. No myalgias.  A/P: continue current dose of medication  Transaminitis S: values previously normalized last year then ALT slightly up again as well as alk phos last visit A/P: update LFTs today- ? Fatty liver, consider further workup if values increase to >2-3x ULN  CAD (coronary artery disease), native coronary artery S:S/p 4 stents- asa, brilinta (for year), statin compliant. No beta blocker as HR and BP likely could not tolerate. Cards visit next month.Patient states after cath and stents his energy level improved to normal for months but over last month has worsened again. Had another cold sweat episode about 3 weeks ago- of note fatigue and cold sweat episodes led to first catheterization and these episodes are worse with exertion (fatigue wise) A/P: high concern cardiac cause for fatigue- will get TSH, CBC, CMET but suspect issue may be cardiac. Called and got patient appointment with NP for Dr. GIrven Shellingoffice on Monday.    Primary plan is Dr. GEinar Gipfollow up- happy to follow up on fatigue if not found to be heart. Also otherwise reasonable to see in 6 months  Orders Placed This Encounter  Procedures  . LDL cholesterol,  direct    [place]  . Triglycerides  . CBC with Differential/Platelet  . Comprehensive metabolic panel    [place]  . TSH    [place]   The duration of face-to-face time during this visit was 25 minutes. Greater than 50% of this time was spent in counseling, explanation of diagnosis, planning of further management, and/or coordination of care.    Return precautions advised.  Garret Reddish, MD

## 2016-04-02 NOTE — Patient Instructions (Addendum)
Arrive at 2pm for visit with Stanley Boyd at Dr. Irven Shelling office. They said Dr. Einar Gip is not available all next week but she will communicate with him.   Labs before you leave to look into fatigue. I did not order a PSA since urology is monitoring.   If you were to have worsening symptoms- need to seek care over weekend

## 2016-04-03 NOTE — Assessment & Plan Note (Signed)
S:  controlled on atorvastatin 80mg  with LDL down to 60. No myalgias.  A/P: continue current dose of medication

## 2016-04-03 NOTE — Assessment & Plan Note (Signed)
S: values previously normalized last year then ALT slightly up again as well as alk phos last visit A/P: update LFTs today- ? Fatty liver, consider further workup if values increase to >2-3x ULN

## 2016-04-03 NOTE — Assessment & Plan Note (Signed)
S:S/p 4 stents- asa, brilinta (for year), statin compliant. No beta blocker as HR and BP likely could not tolerate. Cards visit next month.Patient states after cath and stents his energy level improved to normal for months but over last month has worsened again. Had another cold sweat episode about 3 weeks ago- of note fatigue and cold sweat episodes led to first catheterization and these episodes are worse with exertion (fatigue wise) A/P: high concern cardiac cause for fatigue- will get TSH, CBC, CMET but suspect issue may be cardiac. Called and got patient appointment with NP for Dr. Irven Shelling office on Monday.

## 2016-04-05 LAB — LDL CHOLESTEROL, DIRECT: Direct LDL: 90 mg/dL

## 2016-04-14 NOTE — H&P (Signed)
OFFICE VISIT NOTES COPIED TO EPIC FOR DOCUMENTATION  . History of Present Illness Neldon Labella AGNP-C; April 15, 2016 3:29 PM) The patient is a 65 year old male who presents for a Follow-up for Coronary artery disease. He initially presented to [place] with symptoms of marked fatigue, near syncope, mostly with exertion that started in November 2015, recently were the past 3-4 weeks, had markedly reduced his physical activity as he felt that every time he exerted himself he felt like his on a pass out, feels warm all over the body followed by diaphoresis. No chest pain or shortness of breath. No symptoms to suggest TIA or claudication. No headache or visual disturbances. No recent weight changes.  He was scheduled for cardiac catheterization with staged PCI, first on 07/08/2015 with 3.0x38 & 3.0x78mm Resolute DES to prox and mid LAD, then on 07/17/2015 with stenting of the bifurcating large OM1 and proximal circumflex with implantation of 3.0 x 28 mm in the circumflex and 3.0 x 24 mm Synergy DES with Culotte Technique. He could not tolerate any beta blockers as he became markedly bradycardic and also has borderline low blood pressure. Presently tolerating dual antiplatelet therapy well.  He was last seen in January 2017 for reevaluation and was feeling well at that time. He underwent repeat stress testing in February 2017 due to complex nature of PCI and extremely atypical initial presentation which was negative for evidence of ischemia. He presents today as an acute office visit for evaluation of worsening fatigue, chills, and diaphoresis with exertion over the past 3-4 weeks. He states the initial episode occurred [place] in [place]. Since that time, he is experiencing symptoms every time he tries to exert himself. Recent lab work by PCP reveals that LDL is beginning to increase, now 90 with goal <70.   Problem List/Past Medical (Bridgette Ebony Hail, AGNP-C; 04-15-16  3:28 PM) Hypercholesterolemia (E78.00)  Labwork  04/02/2016: Direct LDL 90, triglycerides 57, CBC normal, creatinine 0.93, potassium 4.5, CMP normal, TSH 2.21 11/01/2015: Alkaline phosphatase 130, minimally elevated. ALT 59, mildly elevated, improved from previous. PSA markedly elevated at 7.2. Labs 10/09/2015: PSA elevated at 4.45. ALT mildly elevated at 75. Labs 09/26/2015 CMP normal except ALT elevated at 58. BUN 16, serum creatinine 1.03. Direct LDL 60. Lipid profile 06/24/2015: Total cholesterol 305, triglycerides 235, HDL 38, LDL 227. Non-HDL cholesterol 266. TSH 1.92, CMP normal. Coronary artery disease involving native coronary artery of native heart without angina pectoris (I25.10)  Coronary angiogram 07/17/2015: stenting of the bifurcating large OM1 and proximal circumflex with implantation of 3.0 x 28 mm in the circumflex and 3.0 x 24 mm Synergy DES with Culotte Technique, Prox and mid LAD 3.0x38 & 3.0x58mm Resolute DES placed on 07/08/2015 widely patent. Echocardiogram 09/06/2014: Normal LV systolic function, borderline LVH, EF 60-65%. Mild mitral and tricuspid regurgitation, unable to estimate PA pressure. S/P PTCA (percutaneous transluminal coronary angioplasty) (Z98.61)  Elevated PSA (R97.20)  BPH (benign prostatic hyperplasia) (N40.0)  History of pneumonia (Z87.01)  Elevated LFTs (R94.5) 09/26/2015  Allergies Anderson Malta Sergeant; April 15, 2016 [id number] PM) Aspirin (Salicylates)  lip swelling.; patient is tolerating 81mg   Family History Anderson Malta Sergeant; 04-15-2016 2:24 PM) Mother  Deceased. at age 41, from ALzheimers. No known Heart Conditions Father  Deceased. in his 48's, from [place]. No known Heart Conditions Sister 2  1 older, 1 younger; Both Hx of DM Brother 1  older; Known Heart murmur. Hx of Stroke in early 29's. Hx of Stent  Social History Anderson Malta Sergeant; 2016/04/15 2:24  PM) Current tobacco use  Never smoker. Non Drinker/No Alcohol Use  Marital status   Married. Number of Children  2. Living Situation  Lives with spouse.  Past Surgical History Lolita Lenz; 04/05/2016 2:24 PM) Back Surgery 1984 Cervical Fusion 2000 1985, 1996  Medication History Anderson Malta Tenkiller; 04/05/2016 2:30 PM) Aspirin Adult Low Strength (81MG  Tablet Chewable, 1 (one) Tablet Chewable Table Oral daily, Taken starting 08/13/2015) Active. Brilinta (90MG  Tablet, 1 Tablet Oral two times daily, Taken starting 07/17/2015) Active. Nitrostat (0.4MG  Tab Sublingual, Sublingual every 5 minutes as needed for chest pain., Taken starting 06/20/2015) Active. Tamsulosin HCl (0.4MG  Capsule, 1 Oral daily) Active. Atorvastatin Calcium (80MG  Tablet, 1 Oral daily) Active. Amoxicillin (500MG  Capsule, 1 Oral prior to dental procedure) Active. Medications Reconciled (verbally with patient/ no list or medication present with patient)  Diagnostic Studies History Anderson Malta Sergeant; 04/05/2016 2:24 PM) Colonoscopy 2012 Coronary Angiogram 07/17/2015 Stenting of the bifurcating large OM1 and proximal circumflex with implantation of 3.0 x 28 mm in the circumflex and 3.0 x 24 mm Synergy DES with Culotte Technique, Prox and mid LAD 3.0x38 & 3.0x49mm Resolute DES placed on 07/08/2015 widely patent.    Review of Systems (Bridgette Ebony Hail AGNP-C; 04/05/2016 3:29 PM) General Present- Fatigue (worsening). Not Present- Fever and Night Sweats. Skin Not Present- Itching and Rash. HEENT Not Present- Headache. Respiratory Not Present- Difficulty Breathing. Cardiovascular Not Present- Claudications, Fainting, Orthopnea and Swelling of Extremities. Gastrointestinal Not Present- Abdominal Pain, Constipation, Diarrhea, Nausea and Vomiting. Musculoskeletal Present- Back Pain. Not Present- Joint Swelling. Neurological Present- Dysesthesia (Right side of the body with mild is just a and also mild weakness since spinal cord injury years ago.) and Weakness In Extremities (Right side of the body  with mild is just a and also mild weakness since spinal cord injury years ago.). Not Present- Fainting, Headaches, Spinning Sensation and Tremor. Hematology Not Present- Blood Clots, Easy Bruising and Nose Bleed.  Vitals Anderson Malta Sergeant; 04/05/2016 2:31 PM) 04/05/2016 2:25 PM Weight: 179.06 lb Height: 69in Body Surface Area: 1.97 m Body Mass Index: 26.44 kg/m  Pulse: 44 (Regular)  P.OX: 99% (Room air) BP: 132/68 (Sitting, Left Arm, Standard)       Physical Exam (Bridgette Ebony Hail, AGNP-C; 04/05/2016 3:29 PM) General Mental Status-Alert. General Appearance-Cooperative, Appears stated age, Not in acute distress. Orientation-Oriented X3. Build & Nutrition-Well built.  Head and Neck Thyroid Gland Characteristics - no palpable nodules, no palpable enlargement.  Chest and Lung Exam Palpation Tender - No chest wall tenderness. Auscultation Breath sounds - Clear.  Cardiovascular Inspection Jugular vein - Right - No Distention. Auscultation Heart Sounds - S1 WNL, S2 WNL and No gallop present. Murmurs & Other Heart Sounds - Murmur - No murmur.  Abdomen Palpation/Percussion Normal exam - Non Tender and No hepatosplenomegaly. Auscultation Normal exam - Bowel sounds normal.  Peripheral Vascular Lower Extremity Inspection - Left - No Pigmentation, No Varicose veins. Right - No Pigmentation, No Varicose veins. Palpation - Edema - Left - No edema. Right - No edema. Femoral pulse - Left - Normal. Right - Normal. Popliteal pulse - Left - Normal. Right - Normal. Dorsalis pedis pulse - Left - Normal. Right - Normal. Posterior tibial pulse - Left - Normal. Right - Normal. Carotid arteries - Left-No Carotid bruit. Carotid arteries - Right-No Carotid bruit. Abdomen-No prominent abdominal aortic pulsation, No epigastric bruit. Note: right radial access site healed well   Neurologic Motor-Grossly intact without any focal deficits.  Musculoskeletal Global  Assessment Left Lower Extremity - normal range of motion  without pain. Right Lower Extremity - normal range of motion without pain.    Assessment & Plan (Bridgette Ebony Hail AGNP-C; 04/06/2016 10:07 AM) Coronary artery disease involving native coronary artery of native heart without angina pectoris (I25.10) Story: Coronary angiogram 07/17/2015: stenting of the bifurcating large OM1 and proximal circumflex with implantation of 3.0 x 28 mm in the circumflex and 3.0 x 24 mm Synergy DES with Culotte Technique, Prox and mid LAD 3.0x38 & 3.0x76mm Resolute DES placed on 07/08/2015 widely patent.  Echocardiogram 09/06/2014: Normal LV systolic function, borderline LVH, EF 60-65%. Mild mitral and tricuspid regurgitation, unable to estimate PA pressure. Impression: EKG 04/05/2016: Marked sinus bradycardia at rate of 42 bpm, normal axis, early repolarization. No evidence of ischemia. No significant change from EKG 06/26/15. Current Plans Complete electrocardiogram ([id number]) METABOLIC PANEL, BASIC ([id number]) CBC & PLATELETS (AUTO) [id number]) PT (PROTHROMBIN TIME) ([id number]) S/P PTCA (percutaneous transluminal coronary angioplasty) (Z98.61) Elevated LFTs (R94.5) Labwork Story: Labs 11/01/2015: Alkaline phosphatase 130, minimally elevated. ALT 59, mildly elevated, improved from previous. PSA markedly elevated at 7.2.  Labs 10/09/2015: PSA elevated at 4.45. ALT mildly elevated at 75. Labs 09/26/2015 CMP normal except ALT elevated at 58. BUN 16, serum creatinine 1.03. Direct LDL 60. Lipid profile 06/24/2015: Total cholesterol 305, triglycerides 235, HDL 38, LDL 227. Non-HDL cholesterol 266. TSH 1.92, CMP normal. Elevated PSA (R97.20) Hypercholesterolemia (E78.00)  Current Plans Mechanism of underlying disease process and action of medications discussed with the patient. I discussed primary/secondary prevention and also dietary counceling was done. He presents for reevaluation due to fatigue and lightheadedness on exertion  over the past 3-4 weeks. These are the same symptoms he presented with initially prior to initial cath and PCI. Suspect progression of CAD. Given symptoms suggestive of his anginal equivalent, will schedule for cardiac catheterization, and possible angioplasty. We discussed regarding risks, benefits, alternatives to this including stress testing, CTA and continued medical therapy. Patient wants to proceed. Understands <1-2% risk of death, stroke, MI, urgent CABG, bleeding, infection, renal failure but not limited to these. Lipids are beginning to increase, will consider PCSK9 inhibitor versus additon of Zetia versus changing to Crestor, however, will decide this following cath. Patient instructed not to do heavy lifting, heavy exertional activity, swimming until evaluation is complete. Patient instructed to call if symptoms worse or to go to the ED for further evaluation.  *I have discussed this case with Dr. Einar Gip and he personally examined the patient and participated in formulating the plan.*  Addendum Note(Bridgette Allison AGNP-C; 04/13/2016 10:07 AM) 04/09/2016: Creatinine 0.99, potassium 5.0, CBC normal, PT/INR normal  Labs stable to proceed with coronary angiogram.     Signed by Neldon Labella, AGNP-)C (04/06/2016 10:08 AM

## 2016-04-15 ENCOUNTER — Other Ambulatory Visit: Payer: Self-pay | Admitting: *Deleted

## 2016-04-16 ENCOUNTER — Other Ambulatory Visit: Payer: Self-pay | Admitting: *Deleted

## 2016-04-16 ENCOUNTER — Ambulatory Visit (HOSPITAL_COMMUNITY)
Admission: RE | Admit: 2016-04-16 | Discharge: 2016-04-17 | Disposition: A | Discharge status: Home / Self Care | Payer: Medicare Other | Source: Ambulatory Visit | Attending: Cardiology | Admitting: Cardiology

## 2016-04-16 ENCOUNTER — Encounter (HOSPITAL_COMMUNITY)
Admission: RE | Disposition: A | Discharge status: Home / Self Care | Payer: Self-pay | Source: Ambulatory Visit | Attending: Cardiology

## 2016-04-16 ENCOUNTER — Encounter (HOSPITAL_COMMUNITY): Payer: Self-pay | Admitting: General Practice

## 2016-04-16 DIAGNOSIS — T82855A Stenosis of coronary artery stent, initial encounter: Secondary | ICD-10-CM | POA: Insufficient documentation

## 2016-04-16 DIAGNOSIS — Z9861 Coronary angioplasty status: Secondary | ICD-10-CM

## 2016-04-16 DIAGNOSIS — I2584 Coronary atherosclerosis due to calcified coronary lesion: Secondary | ICD-10-CM | POA: Insufficient documentation

## 2016-04-16 DIAGNOSIS — Y831 Surgical operation with implant of artificial internal device as the cause of abnormal reaction of the patient, or of later complication, without mention of misadventure at the time of the procedure: Secondary | ICD-10-CM | POA: Insufficient documentation

## 2016-04-16 DIAGNOSIS — E785 Hyperlipidemia, unspecified: Secondary | ICD-10-CM | POA: Diagnosis not present

## 2016-04-16 DIAGNOSIS — I25118 Atherosclerotic heart disease of native coronary artery with other forms of angina pectoris: Secondary | ICD-10-CM | POA: Insufficient documentation

## 2016-04-16 DIAGNOSIS — I2510 Atherosclerotic heart disease of native coronary artery without angina pectoris: Secondary | ICD-10-CM | POA: Diagnosis present

## 2016-04-16 HISTORY — PX: CORONARY STENT PLACEMENT: SHX1402

## 2016-04-16 HISTORY — PX: CARDIAC CATHETERIZATION: SHX172

## 2016-04-16 LAB — POCT ACTIVATED CLOTTING TIME: Activated Clotting Time: 230 seconds

## 2016-04-16 SURGERY — LEFT HEART CATH AND CORONARY ANGIOGRAPHY

## 2016-04-16 MED ORDER — HYDROMORPHONE HCL 1 MG/ML IJ SOLN
INTRAMUSCULAR | Status: DC | PRN
  Administered 2016-04-16: 0.5 mg via INTRAVENOUS

## 2016-04-16 MED ORDER — SODIUM CHLORIDE 0.9% FLUSH: 3.0000 mL | Freq: Two times a day (BID) | INTRAVENOUS | Status: DC

## 2016-04-16 MED ORDER — SODIUM CHLORIDE 0.9 % WEIGHT BASED INFUSION
3.0000 mL/kg/h | INTRAVENOUS | Status: DC
  Administered 2016-04-16: 3 mL/kg/h via INTRAVENOUS

## 2016-04-16 MED ORDER — ASPIRIN 81 MG PO CHEW
81.0000 mg | CHEWABLE_TABLET | Freq: Every day | ORAL | Status: DC
  Administered 2016-04-17: 09:00:00 81 mg via ORAL
  Filled 2016-04-16: qty 1

## 2016-04-16 MED ORDER — NITROGLYCERIN 1 MG/10 ML FOR IR/CATH LAB
INTRA_ARTERIAL | Status: AC
  Filled 2016-04-16: qty 10

## 2016-04-16 MED ORDER — SODIUM CHLORIDE 0.9 % WEIGHT BASED INFUSION
1.0000 mL/kg/h | INTRAVENOUS | Status: DC
  Administered 2016-04-16 (×2): 250 mL via INTRAVENOUS

## 2016-04-16 MED ORDER — HYDROMORPHONE HCL 1 MG/ML IJ SOLN
INTRAMUSCULAR | Status: AC
  Filled 2016-04-16: qty 1

## 2016-04-16 MED ORDER — NITROGLYCERIN 0.4 MG SL SUBL: 0.4000 mg | SUBLINGUAL_TABLET | SUBLINGUAL | Status: DC | PRN

## 2016-04-16 MED ORDER — LIDOCAINE HCL (PF) 1 % IJ SOLN
INTRAMUSCULAR | Status: DC | PRN
  Administered 2016-04-16: 2 mL via SUBCUTANEOUS

## 2016-04-16 MED ORDER — SODIUM CHLORIDE 0.9% FLUSH
3.0000 mL | Freq: Two times a day (BID) | INTRAVENOUS | Status: DC
  Administered 2016-04-16 (×2): 3 mL via INTRAVENOUS

## 2016-04-16 MED ORDER — ASPIRIN 81 MG PO CHEW: 81.0000 mg | CHEWABLE_TABLET | ORAL | Status: DC

## 2016-04-16 MED ORDER — HEPARIN (PORCINE) IN NACL 2-0.9 UNIT/ML-% IJ SOLN
INTRAMUSCULAR | Status: AC
  Filled 2016-04-16: qty 1500

## 2016-04-16 MED ORDER — LIDOCAINE HCL (PF) 1 % IJ SOLN
INTRAMUSCULAR | Status: AC
  Filled 2016-04-16: qty 30

## 2016-04-16 MED ORDER — MIDAZOLAM HCL 2 MG/2ML IJ SOLN
INTRAMUSCULAR | Status: DC | PRN
  Administered 2016-04-16: 2 mg via INTRAVENOUS

## 2016-04-16 MED ORDER — SODIUM CHLORIDE 0.9% FLUSH: 3.0000 mL | INTRAVENOUS | Status: DC | PRN

## 2016-04-16 MED ORDER — VERAPAMIL HCL 2.5 MG/ML IV SOLN
INTRAVENOUS | Status: AC
  Filled 2016-04-16: qty 2

## 2016-04-16 MED ORDER — HEPARIN SODIUM (PORCINE) 1000 UNIT/ML IJ SOLN
INTRAMUSCULAR | Status: DC | PRN
  Administered 2016-04-16: 3000 [IU] via INTRAVENOUS
  Administered 2016-04-16: 6000 [IU] via INTRAVENOUS

## 2016-04-16 MED ORDER — ROSUVASTATIN CALCIUM 20 MG PO TABS: 20.0000 mg | ORAL_TABLET | Freq: Every day | ORAL | Status: DC

## 2016-04-16 MED ORDER — IOPAMIDOL (ISOVUE-370) INJECTION 76%
INTRAVENOUS | Status: AC
  Filled 2016-04-16: qty 100

## 2016-04-16 MED ORDER — ANGIOPLASTY BOOK
Freq: Once | Status: AC
  Administered 2016-04-17: 11:00:00
  Filled 2016-04-16: qty 1

## 2016-04-16 MED ORDER — MIDAZOLAM HCL 2 MG/2ML IJ SOLN
INTRAMUSCULAR | Status: AC
  Filled 2016-04-16: qty 2

## 2016-04-16 MED ORDER — ONDANSETRON HCL 4 MG/2ML IJ SOLN: 4.0000 mg | Freq: Four times a day (QID) | INTRAMUSCULAR | Status: DC | PRN

## 2016-04-16 MED ORDER — TAMSULOSIN HCL 0.4 MG PO CAPS: 0.4000 mg | ORAL_CAPSULE | Freq: Every day | ORAL | Status: DC

## 2016-04-16 MED ORDER — HEPARIN (PORCINE) IN NACL 2-0.9 UNIT/ML-% IJ SOLN
INTRAMUSCULAR | Status: DC | PRN
  Administered 2016-04-16: 1500 mL

## 2016-04-16 MED ORDER — IOPAMIDOL (ISOVUE-370) INJECTION 76%
INTRAVENOUS | Status: DC | PRN
  Administered 2016-04-16: 140 mL via INTRA_ARTERIAL

## 2016-04-16 MED ORDER — IOPAMIDOL (ISOVUE-370) INJECTION 76%
INTRAVENOUS | Status: AC
  Filled 2016-04-16: qty 50

## 2016-04-16 MED ORDER — ATORVASTATIN CALCIUM 80 MG PO TABS: 80.0000 mg | ORAL_TABLET | Freq: Every day | ORAL | Status: DC

## 2016-04-16 MED ORDER — ACETAMINOPHEN 325 MG PO TABS: 650.0000 mg | ORAL_TABLET | ORAL | Status: DC | PRN

## 2016-04-16 MED ORDER — SODIUM CHLORIDE 0.9 % WEIGHT BASED INFUSION: 3.0000 mL/kg/h | INTRAVENOUS | Status: AC

## 2016-04-16 MED ORDER — VERAPAMIL HCL 2.5 MG/ML IV SOLN
INTRA_ARTERIAL | Status: DC | PRN
  Administered 2016-04-16: 10 mL via INTRA_ARTERIAL

## 2016-04-16 MED ORDER — SODIUM CHLORIDE 0.9 % IV SOLN: 250.0000 mL | INTRAVENOUS | Status: DC | PRN

## 2016-04-16 MED ORDER — HEPARIN SODIUM (PORCINE) 1000 UNIT/ML IJ SOLN
INTRAMUSCULAR | Status: AC
  Filled 2016-04-16: qty 1

## 2016-04-16 MED ORDER — TICAGRELOR 90 MG PO TABS
90.0000 mg | ORAL_TABLET | Freq: Two times a day (BID) | ORAL | Status: DC
  Administered 2016-04-16 – 2016-04-17 (×2): 90 mg via ORAL
  Filled 2016-04-16 (×2): qty 1

## 2016-04-16 SURGICAL SUPPLY — 17 items
BALLN ANGIOSCULPT RX 3.0X6 (BALLOONS) ×2
BALLN EMERGE MR 2.0X12 (BALLOONS) ×2
BALLOON ANGIOSCULPT RX 3.0X6 (BALLOONS) ×1 IMPLANT
BALLOON EMERGE MR 2.0X12 (BALLOONS) ×1 IMPLANT
CATH OPTITORQUE TIG 4.0 5F (CATHETERS) ×2 IMPLANT
CATH VISTA GUIDE 6FR XBLAD3.5 (CATHETERS) ×2 IMPLANT
DEVICE RAD COMP TR BAND LRG (VASCULAR PRODUCTS) ×2 IMPLANT
GLIDESHEATH SLEND A-KIT 6F 20G (SHEATH) ×2 IMPLANT
KIT ENCORE 26 ADVANTAGE (KITS) ×4 IMPLANT
KIT HEART LEFT (KITS) ×2 IMPLANT
PACK CARDIAC CATHETERIZATION (CUSTOM PROCEDURE TRAY) ×2 IMPLANT
STENT RESOLUTE INTEG 3.0X12 (Permanent Stent) ×2 IMPLANT
TRANSDUCER W/STOPCOCK (MISCELLANEOUS) ×2 IMPLANT
TUBING CIL FLEX 10 FLL-RA (TUBING) ×2 IMPLANT
WIRE COUGAR XT STRL 190CM (WIRE) ×2 IMPLANT
WIRE HI TORQ BMW 190CM (WIRE) ×2 IMPLANT
WIRE SAFE-T 1.5MM-J .035X260CM (WIRE) ×2 IMPLANT

## 2016-04-16 NOTE — Progress Notes (Signed)
Attempted to aspirate 2cc from radial band, rebled, reinstilled 2cc. Level 0. Radial pulse 2+

## 2016-04-16 NOTE — Interval H&P Note (Signed)
History and Physical Interval Note:  04/16/2016 7:48 AM  Stanley Boyd  has presented today for surgery, with the diagnosis of cp  The various methods of treatment have been discussed with the patient and family. After consideration of risks, benefits and other options for treatment, the patient has consented to  Procedure(s): Left Heart Cath and Coronary Angiography (N/A) and possible PCI as a surgical intervention .  The patient's history has been reviewed, patient examined, no change in status, stable for surgery.  I have reviewed the patient's chart and labs.  Questions were answered to the patient's satisfaction.   Ischemic Symptoms? CCS III (Marked limitation of ordinary activity) Anti-ischemic Medical Therapy? Minimal Therapy (1 class of medications) Non-invasive Test Results? No non-invasive testing performed Prior CABG? No Previous CABG   Patient Information:   1-2V CAD, no prox LAD  A (7)  Indication: 20; Score: 7   Patient Information:   1-2V-CAD with DS 50-60% With No FFR, No IVUS  I (3)  Indication: 21; Score: 3   Patient Information:   1-2V-CAD with DS 50-60% With FFR  A (7)  Indication: 22; Score: 7   Patient Information:   1-2V-CAD with DS 50-60% With FFR>0.8, IVUS not significant  I (2)  Indication: 23; Score: 2   Patient Information:   3V-CAD without LMCA With Abnormal LV systolic function  A (9)  Indication: 48; Score: 9   Patient Information:   LMCA-CAD  A (9)  Indication: 49; Score: 9   Patient Information:   2V-CAD with prox LAD PCI  A (7)  Indication: 62; Score: 7   Patient Information:   2V-CAD with prox LAD CABG  A (8)  Indication: 62; Score: 8   Patient Information:   3V-CAD without LMCA With Low CAD burden(i.e., 3 focal stenoses, low SYNTAX score) PCI  A (7)  Indication: 63; Score: 7   Patient Information:   3V-CAD without LMCA With Low CAD burden(i.e., 3 focal stenoses, low SYNTAX score) CABG  A (9)   Indication: 63; Score: 9   Patient Information:   3V-CAD without LMCA E06c - Intermediate-high CAD burden (i.e., multiple diffuse lesions, presence of CTO, or high SYNTAX score) PCI  U (4)  Indication: 64; Score: 4   Patient Information:   3V-CAD without LMCA E06c - Intermediate-high CAD burden (i.e., multiple diffuse lesions, presence of CTO, or high SYNTAX score) CABG  A (9)  Indication: 64; Score: 9   Patient Information:   LMCA-CAD With Isolated LMCA stenosis  PCI  U (6)  Indication: 65; Score: 6   Patient Information:   LMCA-CAD With Isolated LMCA stenosis  CABG  A (9)  Indication: 65; Score: 9   Patient Information:   LMCA-CAD Additional CAD, low CAD burden (i.e., 1- to 2-vessel additional involvement, low SYNTAX score) PCI  U (5)  Indication: 66; Score: 5   Patient Information:   LMCA-CAD Additional CAD, low CAD burden (i.e., 1- to 2-vessel additional involvement, low SYNTAX score) CABG  A (9)  Indication: 66; Score: 9   Patient Information:   LMCA-CAD Additional CAD, intermediate-high CAD burden (i.e., 3-vessel involvement, presence of CTO, or high SYNTAX score) PCI  I (3)  Indication: 67; Score: 3   Patient Information:   LMCA-CAD Additional CAD, intermediate-high CAD burden (i.e., 3-vessel involvement, presence of CTO, or high SYNTAX score) CABG  A (9)  Indication: 67; Score: 9   Stanley Boyd

## 2016-04-16 NOTE — Progress Notes (Signed)
Family in to visit. Eating [place] sandwich. Waiting on 6500 bed

## 2016-04-17 DIAGNOSIS — T82855A Stenosis of coronary artery stent, initial encounter: Secondary | ICD-10-CM | POA: Diagnosis not present

## 2016-04-17 DIAGNOSIS — Z9861 Coronary angioplasty status: Secondary | ICD-10-CM | POA: Diagnosis not present

## 2016-04-17 DIAGNOSIS — I25118 Atherosclerotic heart disease of native coronary artery with other forms of angina pectoris: Secondary | ICD-10-CM | POA: Diagnosis not present

## 2016-04-17 DIAGNOSIS — I2584 Coronary atherosclerosis due to calcified coronary lesion: Secondary | ICD-10-CM | POA: Diagnosis not present

## 2016-04-17 LAB — CBC
HCT: 39.3 % (ref 39.0–52.0)
Hemoglobin: 12.8 g/dL — ABNORMAL LOW (ref 13.0–17.0)
MCH: 30 pg (ref 26.0–34.0)
MCHC: 32.6 g/dL (ref 30.0–36.0)
MCV: 92.3 fL (ref 78.0–100.0)
Platelets: 177 10*3/uL (ref 150–400)
RBC: 4.26 MIL/uL (ref 4.22–5.81)
RDW: 13.9 % (ref 11.5–15.5)
WBC: 6.2 10*3/uL (ref 4.0–10.5)

## 2016-04-17 LAB — BASIC METABOLIC PANEL
Anion gap: 4 — ABNORMAL LOW (ref 5–15)
BUN: 14 mg/dL (ref 6–20)
CO2: 28 mmol/L (ref 22–32)
Calcium: 9.1 mg/dL (ref 8.9–10.3)
Chloride: 107 mmol/L (ref 101–111)
Creatinine, Ser: 1.01 mg/dL (ref 0.61–1.24)
GFR calc Af Amer: >60 mL/min (ref >60)
GFR calc non Af Amer: >60 mL/min (ref >60)
Glucose, Bld: 116 mg/dL — ABNORMAL HIGH (ref 65–99)
Potassium: 4.1 mmol/L (ref 3.5–5.1)
Sodium: 139 mmol/L (ref 135–145)

## 2016-04-17 MED ORDER — ROSUVASTATIN CALCIUM 20 MG PO TABS: 20.0000 mg | ORAL_TABLET | Freq: Every day | ORAL | Status: DC

## 2016-04-17 MED ORDER — ANGIOPLASTY BOOK: 1.0000 | Freq: Once | Status: DC

## 2016-04-17 NOTE — Progress Notes (Signed)
CARDIAC REHAB PHASE I   PRE:  Rate/Rhythm: Sinus brady 45  BP:    Sitting: 104/48     SaO2: 96% on room air  MODE:  Ambulation: 600  ft   POST:  Rate/Rhythem: Sinus 73  BP:    Sitting: 121/50     SaO2: 98% room air 1015-1050 Patient ambulated in hallway independently without complaints. Reviewed exercise instructions and use of sublingual nitroglycerin. Patient given heart healthy diet information. Stanley Boyd says he is not interested in phase 2 cardiac rehab in [place] will place referral in case he changes his mind.   Harrell Gave RN BSN

## 2016-04-17 NOTE — Discharge Summary (Signed)
Physician Discharge Summary  Patient ID: Stanley Boyd MRN: [id number] DOB/AGE: April 29, 1951 65 y.o.  Admit date: 04/16/2016 Discharge date: 04/17/2016  Primary Discharge Diagnosis CAD with anginal equivalent dyspnea and fatigue Hyperlipidemia Asymptomatic marked S. Bradycardia.  Significant Diagnostic Studies: Coronary angiogram 04/16/2016: 1. Patient with h/o staged PCI, first on 07/08/2015 with 3.0x38 & 3.0x70mm Resolute DES to prox and mid LAD, then on 07/17/2015 with stenting of the bifurcating large OM1 and proximal circumflex with implantation of 3.0 x 28 mm in the circumflex and 3.0 x 24 mm Synergy DES with Culotte Technique.  2. Previously placed stents are widely patent except OM1 has a proximal focal in-stent restenosis of 80%. The ostial LAD has a focal 80% stenosis proximal to the previously placed proximal LAD stent. The previously placed proximal LAD and mid LAD stent widely patent, previously stent jailed diagonal also widely patent. 3. Normal LV systolic function, EF 0000000. 4. Successful PTCA and stenting of the ostial LAD with implantation of a 3.0 x 12 mm resolute integrity DES, stenosis reduced from 80% to 0% with maintenance of TIMI-3 flow. Ostial OM1 in-stent restenosis reduced to less than 20% with a 2.0 x 12 mm emerge balloon at 14 atmospheric pressure.  Hospital Course: Patient admitted for elective coronary angiography due to recent worsening dyspnea, dizziness and fatigue which are his anginal:.  Found to have ostial LAD high-grade stenosis just proximal to the previously placed proximal LAD stent.  Underwent successful angioplasty.  Also had very focal restenosis in the ramus intermediate ranch for which he underwent angioplasty with excellent result.  The following day he was felt stable for discharge. Recommendations on discharge: Patient is not on beta blocker or ACE inhibitor due to low blood pressure and marked sinus bradycardia.  He could not tolerate Lipitor due to  myalgias, we'll switch to Crestor 20 mg by mouth daily.  Recheck lipids in 8-12 weeks. Continue aspirin and Brilinta for at least 1 more year.  Discharge Exam: Blood pressure 106/45, pulse 45, temperature 97.6 F (36.4 C), temperature source Oral, resp. rate 17, height 5\' 9"  (1.753 m), weight 81.6 kg (179 lb 14.3 oz), SpO2 98 %.   General appearance: alert, cooperative, appears stated age and no distress Resp: clear to auscultation bilaterally Cardio: regular rate and rhythm, S1, S2 normal, no murmur, click, rub or gallop GI: soft, non-tender; bowel sounds normal; no masses,  no organomegaly Extremities: extremities normal, atraumatic, no cyanosis or edema Pulses: 2+ and symmetric Right radial access normal. Neurologic: Grossly normal Labs:   Lab Results  Component Value Date   WBC 6.2 04/17/2016   HGB 12.8* 04/17/2016   HCT 39.3 04/17/2016   MCV 92.3 04/17/2016   PLT 177 04/17/2016    Recent Labs Lab 04/17/16 0551  NA 139  K 4.1  CL 107  CO2 28  BUN 14  CREATININE 1.01  CALCIUM 9.1  GLUCOSE 116*    Lipid Panel     Component Value Date/Time   CHOL 305* 06/24/2015 0914   TRIG 57.0 04/02/2016 1231   HDL 38.40* 06/24/2015 0914   CHOLHDL 8 06/24/2015 0914   VLDL 47.0* 06/24/2015 0914   LDLCALC 239* 11/07/2014 1042    EKG: Marked sinus bradycardia at the rate of 46 bpm with 30 repolarization, unchanged from previous tracings.  FOLLOW UP PLANS AND APPOINTMENTS    Medication List    STOP taking these medications        atorvastatin 80 MG tablet  Commonly known as:  LIPITOR  TAKE these medications        angioplasty book Misc  1 each by Does not apply route once.     aspirin 81 MG chewable tablet  Chew 1 tablet (81 mg total) by mouth daily.     nitroGLYCERIN 0.4 MG SL tablet  Commonly known as:  NITROSTAT  Place 1 tablet (0.4 mg total) under the tongue every 5 (five) minutes as needed for chest pain.     rosuvastatin 20 MG tablet  Commonly known  as:  CRESTOR  Take 1 tablet (20 mg total) by mouth daily.     tamsulosin 0.4 MG Caps capsule  Commonly known as:  FLOMAX  Take 1 capsule (0.4 mg total) by mouth daily after supper.     ticagrelor 90 MG Tabs tablet  Commonly known as:  BRILINTA  Take 1 tablet (90 mg total) by mouth 2 (two) times daily.           Follow-up Information    Follow up with Adrian Prows, MD On 04/26/2016.   Specialty:  Cardiology   Why:  Keep previous appointment scheduled   Contact information:   [facility] [place] [address] [phone]      Adrian Prows, MD 04/17/2016, 8:34 AM  Pager: [phone] Office: [phone] If no answer: [phone]

## 2016-04-17 NOTE — Discharge Instructions (Addendum)
° °  STOP TAKING THE FOLLOWING MEDICATIONS:  ATORVASTATIN (LIPITOR)

## 2016-07-02 ENCOUNTER — Encounter: Payer: Self-pay | Admitting: Family Medicine

## 2016-07-09 ENCOUNTER — Other Ambulatory Visit: Payer: Self-pay | Admitting: Family Medicine

## 2016-07-11 ENCOUNTER — Encounter: Payer: Self-pay | Admitting: Family Medicine

## 2016-11-08 ENCOUNTER — Encounter: Payer: Self-pay | Admitting: Family Medicine

## 2016-11-17 ENCOUNTER — Encounter: Payer: Self-pay | Admitting: Family Medicine

## 2016-11-26 ENCOUNTER — Encounter: Payer: Self-pay | Admitting: Family Medicine

## 2016-11-26 NOTE — Telephone Encounter (Signed)
The fax # to [facility] in [place] is [phone]

## 2016-11-29 ENCOUNTER — Other Ambulatory Visit: Payer: Self-pay | Admitting: Family Medicine

## 2016-11-29 LAB — CBC AND DIFFERENTIAL
HCT: 45 % (ref 41–53)
Hemoglobin: 14.9 g/dL (ref 13.5–17.5)
Neutrophils Absolute: 56 /uL
Platelets: 233 10*3/uL (ref 150–399)
WBC: 6.1 10^3/mL

## 2016-11-29 LAB — HEPATIC FUNCTION PANEL
ALT: 50 U/L — AB (ref 10–40)
AST: 40 U/L (ref 14–40)
Alkaline Phosphatase: 87 U/L (ref 25–125)
Bilirubin, Total: 0.5 mg/dL

## 2016-11-29 LAB — LIPID PANEL
Cholesterol: 130 mg/dL (ref 0–200)
HDL: 44 mg/dL (ref 35–70)
LDL Cholesterol: 65 mg/dL
Triglycerides: 104 mg/dL (ref 40–160)

## 2016-11-29 LAB — PSA: PSA: 4.9

## 2016-11-29 LAB — BASIC METABOLIC PANEL
BUN: 12 mg/dL (ref 4–21)
Creatinine: 1 mg/dL (ref <=1.3)
Glucose: 112 mg/dL
Potassium: 5 mmol/L (ref 3.4–5.3)
Sodium: 139 mmol/L (ref 137–147)

## 2016-11-30 ENCOUNTER — Encounter: Payer: Self-pay | Admitting: Family Medicine

## 2016-11-30 LAB — CBC WITH DIFFERENTIAL/PLATELET
Basophils Absolute: 0 10*3/uL (ref 0.0–0.2)
Basos: 0 %
EOS (ABSOLUTE): 0.2 10*3/uL (ref 0.0–0.4)
Eos: 3 %
Hematocrit: 44.6 % (ref 37.5–51.0)
Hemoglobin: 14.9 g/dL (ref 13.0–17.7)
Immature Grans (Abs): 0 10*3/uL (ref 0.0–0.1)
Immature Granulocytes: 0 %
Lymphocytes Absolute: 2.1 10*3/uL (ref 0.7–3.1)
Lymphs: 34 %
MCH: 30.3 pg (ref 26.6–33.0)
MCHC: 33.4 g/dL (ref 31.5–35.7)
MCV: 91 fL (ref 79–97)
Monocytes Absolute: 0.4 10*3/uL (ref 0.1–0.9)
Monocytes: 7 %
Neutrophils Absolute: 3.3 10*3/uL (ref 1.4–7.0)
Neutrophils: 56 %
Platelets: 233 10*3/uL (ref 150–379)
RBC: 4.92 x10E6/uL (ref 4.14–5.80)
RDW: 14.1 % (ref 12.3–15.4)
WBC: 6.1 10*3/uL (ref 3.4–10.8)

## 2016-11-30 LAB — URINALYSIS, ROUTINE W REFLEX MICROSCOPIC
Bilirubin, UA: NEGATIVE
Glucose, UA: NEGATIVE
Ketones, UA: NEGATIVE
Leukocytes, UA: NEGATIVE
Nitrite, UA: NEGATIVE
Protein, UA: NEGATIVE
RBC, UA: NEGATIVE
Specific Gravity, UA: 1.02 (ref 1.005–1.030)
Urobilinogen, Ur: 0.2 mg/dL (ref 0.2–1.0)
pH, UA: 5.5 (ref 5.0–7.5)

## 2016-11-30 LAB — LIPID PANEL W/O CHOL/HDL RATIO
Cholesterol, Total: 130 mg/dL (ref 100–199)
HDL: 44 mg/dL (ref >39)
LDL Calculated: 65 mg/dL (ref 0–99)
Triglycerides: 104 mg/dL (ref 0–149)
VLDL Cholesterol Cal: 21 mg/dL (ref 5–40)

## 2016-11-30 LAB — HEPATIC FUNCTION PANEL
ALT: 50 IU/L — ABNORMAL HIGH (ref 0–44)
AST: 40 IU/L (ref 0–40)
Albumin: 4.7 g/dL (ref 3.6–4.8)
Alkaline Phosphatase: 87 IU/L (ref 39–117)
Bilirubin Total: 0.5 mg/dL (ref 0.0–1.2)
Bilirubin, Direct: 0.11 mg/dL (ref 0.00–0.40)
Total Protein: 7.7 g/dL (ref 6.0–8.5)

## 2016-11-30 LAB — BASIC METABOLIC PANEL
BUN/Creatinine Ratio: 12 (ref 10–24)
BUN: 12 mg/dL (ref 8–27)
CO2: 26 mmol/L (ref 18–29)
Calcium: 9.9 mg/dL (ref 8.6–10.2)
Chloride: 99 mmol/L (ref 96–106)
Creatinine, Ser: 0.98 mg/dL (ref 0.76–1.27)
GFR calc Af Amer: 94 mL/min/{1.73_m2} (ref >59)
GFR calc non Af Amer: 82 mL/min/{1.73_m2} (ref >59)
Glucose: 112 mg/dL — ABNORMAL HIGH (ref 65–99)
Potassium: 5 mmol/L (ref 3.5–5.2)
Sodium: 139 mmol/L (ref 134–144)

## 2016-11-30 LAB — TSH: TSH: 3.27 u[IU]/mL (ref 0.450–4.500)

## 2016-11-30 LAB — PSA: Prostate Specific Ag, Serum: 4.9 ng/mL — ABNORMAL HIGH (ref 0.0–4.0)

## 2016-12-02 ENCOUNTER — Encounter: Payer: Self-pay | Admitting: Family Medicine

## 2016-12-03 ENCOUNTER — Ambulatory Visit (INDEPENDENT_AMBULATORY_CARE_PROVIDER_SITE_OTHER): Payer: Medicare Other | Admitting: Family Medicine

## 2016-12-03 ENCOUNTER — Encounter: Payer: Self-pay | Admitting: Family Medicine

## 2016-12-03 VITALS — BP 98/52 | HR 50 | Temp 97.6°F | Ht 69.5 in | Wt 190.2 lb

## 2016-12-03 DIAGNOSIS — Z0000 Encounter for general adult medical examination without abnormal findings: Secondary | ICD-10-CM | POA: Diagnosis not present

## 2016-12-03 DIAGNOSIS — Z23 Encounter for immunization: Secondary | ICD-10-CM | POA: Diagnosis not present

## 2016-12-03 NOTE — Progress Notes (Signed)
Pre visit review using our clinic review tool, if applicable. No additional management support is needed unless otherwise documented below in the visit note. 

## 2016-12-03 NOTE — Addendum Note (Signed)
Addended by: Lucianne Lei M on: 12/03/2016 11:36 AM   Modules accepted: Orders
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
## 2016-12-03 NOTE — Patient Instructions (Addendum)
Pneumovax 23 was given on 08/24/15. Not sure about coverage for prevnar 13 before age 65- would plan to give at 61. Then repeat pneumovax 23 08/23/2020 or later.   I think losing 5-10 lbs would be reasonable with blood sugar up slighlty  Only one of liver tests slightly high- ALT hair high- will continue crestor and monitor every 6 months. Work on 5-10 lbs weight loss  PSA up just a hair from September reading that I saw in report of 4.5 to now 4.9- make sure to follow up with urology  Get labs in 6 months and then can see me about a week later so we can review together.

## 2017-02-24 ENCOUNTER — Encounter: Payer: Self-pay | Admitting: Family Medicine

## 2017-02-25 ENCOUNTER — Encounter: Payer: Self-pay | Admitting: Family Medicine

## 2017-02-25 DIAGNOSIS — R972 Elevated prostate specific antigen [PSA]: Secondary | ICD-10-CM | POA: Insufficient documentation

## 2017-05-31 ENCOUNTER — Other Ambulatory Visit: Payer: Self-pay | Admitting: Family Medicine

## 2017-05-31 LAB — HEPATIC FUNCTION PANEL
ALT: 218 — AB (ref 10–40)
AST: 175 — AB (ref 14–40)
Alkaline Phosphatase: 70 (ref 25–125)
Bilirubin, Total: 0.3

## 2017-05-31 LAB — BASIC METABOLIC PANEL
BUN: 13 (ref 4–21)
Creatinine: 0.9 (ref 0.6–1.3)
Glucose: 113
Potassium: 4.8 (ref 3.4–5.3)
Sodium: 139 (ref 137–147)

## 2017-05-31 LAB — CBC AND DIFFERENTIAL
HCT: 42 (ref 41–53)
Hemoglobin: 14.3 (ref 13.5–17.5)
Platelets: 236 (ref 150–399)
WBC: 5.8

## 2017-06-01 ENCOUNTER — Encounter: Payer: Self-pay | Admitting: Family Medicine

## 2017-06-01 LAB — AMBIG ABBREV CMP14 DEFAULT

## 2017-06-01 LAB — COMPREHENSIVE METABOLIC PANEL
ALT: 218 IU/L — ABNORMAL HIGH (ref 0–44)
AST: 175 IU/L — ABNORMAL HIGH (ref 0–40)
Albumin/Globulin Ratio: 1.6 (ref 1.2–2.2)
Albumin: 4.4 g/dL (ref 3.6–4.8)
Alkaline Phosphatase: 70 IU/L (ref 39–117)
BUN/Creatinine Ratio: 15 (ref 10–24)
BUN: 13 mg/dL (ref 8–27)
Bilirubin Total: 0.3 mg/dL (ref 0.0–1.2)
CO2: 24 mmol/L (ref 20–29)
Calcium: 9.9 mg/dL (ref 8.6–10.2)
Chloride: 100 mmol/L (ref 96–106)
Creatinine, Ser: 0.86 mg/dL (ref 0.76–1.27)
GFR calc Af Amer: 106 mL/min/{1.73_m2} (ref >59)
GFR calc non Af Amer: 92 mL/min/{1.73_m2} (ref >59)
Globulin, Total: 2.8 g/dL (ref 1.5–4.5)
Glucose: 118 mg/dL — ABNORMAL HIGH (ref 65–99)
Potassium: 4.8 mmol/L (ref 3.5–5.2)
Sodium: 139 mmol/L (ref 134–144)
Total Protein: 7.2 g/dL (ref 6.0–8.5)

## 2017-06-01 LAB — PSA: Prostate Specific Ag, Serum: 3.6 ng/mL (ref 0.0–4.0)

## 2017-06-03 ENCOUNTER — Ambulatory Visit (INDEPENDENT_AMBULATORY_CARE_PROVIDER_SITE_OTHER): Payer: Medicare Other | Admitting: Family Medicine

## 2017-06-03 ENCOUNTER — Encounter: Payer: Self-pay | Admitting: Family Medicine

## 2017-06-03 VITALS — BP 110/62 | HR 50 | Temp 97.9°F | Wt 182.0 lb

## 2017-06-03 DIAGNOSIS — R945 Abnormal results of liver function studies: Secondary | ICD-10-CM

## 2017-06-03 DIAGNOSIS — R351 Nocturia: Secondary | ICD-10-CM

## 2017-06-03 DIAGNOSIS — E785 Hyperlipidemia, unspecified: Secondary | ICD-10-CM

## 2017-06-03 DIAGNOSIS — I2510 Atherosclerotic heart disease of native coronary artery without angina pectoris: Secondary | ICD-10-CM | POA: Diagnosis not present

## 2017-06-03 DIAGNOSIS — N401 Enlarged prostate with lower urinary tract symptoms: Secondary | ICD-10-CM

## 2017-06-03 DIAGNOSIS — R7989 Other specified abnormal findings of blood chemistry: Secondary | ICD-10-CM

## 2017-06-03 MED ORDER — TAMSULOSIN HCL 0.4 MG PO CAPS
ORAL_CAPSULE | ORAL | 3 refills | Status: DC
Start: 2017-06-03 — End: 2018-06-26

## 2017-06-03 NOTE — Assessment & Plan Note (Signed)
S: well controlled on crestor 20mg  with LDL under 70. No myalgias.  Lab Results  Component Value Date   CHOL 130 11/29/2016   HDL 44 11/29/2016   LDLCALC 65 11/29/2016   LDLDIRECT 90.0 04/02/2016   TRIG 104 11/29/2016   CHOLHDL 8 06/24/2015   A/P: LFT elevation on crestor 20mg . Stop for 2-3 weeks and recheck LFTs. Patient's son Dr. Thersa Salt had expressed interest in pcsk9 inhibitor- this may be a better option given intermittent LFT elevations- will reach out to Dr. Einar Gip for his opinion on this plan

## 2017-06-03 NOTE — Patient Instructions (Addendum)
Stop crestor for now  I am reaching out to Dr. Einar Gip to see about an injectable medicine that may help cholesterol but be better for your liver tests  Please get liver tests in 2-3 weeks to recheck off of the crestor

## 2017-06-03 NOTE — Assessment & Plan Note (Signed)
S: once again primarily presents with cold sweats and fatigue when he has symptoms. Luckily has not had any of these symptoms lately . He has had 5 stents total at this point. He is compliant with Brilinta and aspirin . He is following with Dr. Einar Gip. He is not using nitroglycerin. Compliant with crestor 20mg .   No cold sweats but "not feeling quite right". Feels like stuck in a rutt. Denies anhedonia- wants to get back to hunting which comes soon. 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  A/P:continue current meds except see hyperlipidemia about statin

## 2017-06-03 NOTE — Progress Notes (Signed)
Subjective:  Stanley Boyd is a 67 y.o. year old very pleasant male patient who presents for/with See problem oriented charting ROS- No chest pain or shortness of breath. No headache or blurry vision. Would prefer to be hunting over babysitting . Some left foot pain  Past Medical History-  Patient Active Problem List   Diagnosis Date Noted  . CAD (coronary artery disease), native coronary artery 07/13/2015    Priority: High  . S/P PTCA (percutaneous transluminal coronary angioplasty) 07/08/2015    Priority: High  . Hemiparesis ([place]) 09/18/2014    Priority: Medium  . BPH (benign prostatic hyperplasia) 09/18/2014    Priority: Medium  . Hyperlipidemia     Priority: Medium  . Bradycardia     Priority: Medium  . History of colonic polyps 10/15/2014    Priority: Low  . Transaminitis 09/18/2014    Priority: Low  . Elevated PSA 02/25/2017  . Post PTCA 04/16/2016    Medications- reviewed and updated Current Outpatient Prescriptions  Medication Sig Dispense Refill  . angioplasty book MISC 1 each by Does not apply route once. 1 each 0  . aspirin 81 MG chewable tablet Chew 1 tablet (81 mg total) by mouth daily. 30 tablet 0  . nitroGLYCERIN (NITROSTAT) 0.4 MG SL tablet Place 1 tablet (0.4 mg total) under the tongue every 5 (five) minutes as needed for chest pain. 25 tablet 4  . rosuvastatin (CRESTOR) 20 MG tablet Take 1 tablet (20 mg total) by mouth daily. 30 tablet 3  . tamsulosin (FLOMAX) 0.4 MG CAPS capsule TAKE ONE CAPSULE BY MOUTH ONCE DAILY WITH SUPPER 30 capsule 11  . ticagrelor (BRILINTA) 90 MG TABS tablet Take 1 tablet (90 mg total) by mouth 2 (two) times daily. 60 tablet 1   No current facility-administered medications for this visit.     Objective: BP 110/62 (BP Location: Left Arm, Patient Position: Sitting, Cuff Size: Normal)   Pulse (!) 50   Temp 97.9 F (36.6 C) (Oral)   Wt 182 lb (82.6 kg)   SpO2 98%   BMI 26.49 kg/m  Gen: NAD, resting comfortably CV: RRR no  murmurs rubs or gallops Lungs: CTAB no crackles, wheeze, rhonchi Ext: no edema Skin: warm, dry  Msk: some pain with palpation at 2nd MTP joint. Declines podiatry referral or icing  Assessment/Plan:  CAD (coronary artery disease), native coronary artery S: once again primarily presents with cold sweats and fatigue when he has symptoms. Luckily has not had any of these symptoms lately . He has had 5 stents total at this point. He is compliant with Brilinta and aspirin . He is following with Dr. Einar Gip. He is not using nitroglycerin. Compliant with crestor 20mg .   No cold sweats but "not feeling quite right". Feels like stuck in a rutt. Denies anhedonia- wants to get back to hunting which comes soon. 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  A/P:continue current meds except see hyperlipidemia about statin  BPH (benign prostatic hyperplasia) S: compliant with flomax. Seeing urology for psa elevation A/P: continue current medications- refilled today   Hyperlipidemia S: well controlled on crestor 20mg  with LDL under 70. No myalgias.  Lab Results  Component Value Date   CHOL 130 11/29/2016   HDL 44 11/29/2016   LDLCALC 65 11/29/2016   LDLDIRECT 90.0 04/02/2016   TRIG 104 11/29/2016   CHOLHDL 8 06/24/2015   A/P: LFT elevation on crestor 20mg . Stop for 2-3 weeks and recheck  LFTs. Patient's son Dr. Thersa Salt had expressed interest in pcsk9 inhibitor- this may be a better option given intermittent LFT elevations- will reach out to Dr. Einar Gip for his opinion on this plan  Verbally discussed/written in Return in about 6 months (around 12/04/2017).  Meds ordered this encounter  Medications  . tamsulosin (FLOMAX) 0.4 MG CAPS capsule    Sig: TAKE ONE CAPSULE BY MOUTH ONCE DAILY WITH SUPPER    Dispense:  90 capsule    Refill:  3    Return precautions advised.  Garret Reddish, MD

## 2017-06-03 NOTE — Assessment & Plan Note (Signed)
S: compliant with flomax. Seeing urology for psa elevation A/P: continue current medications- refilled today

## 2017-06-28 ENCOUNTER — Other Ambulatory Visit: Payer: Self-pay | Admitting: Family Medicine

## 2017-06-29 LAB — HEPATIC FUNCTION PANEL
ALT: 117 IU/L — ABNORMAL HIGH (ref 0–44)
AST: 90 IU/L — ABNORMAL HIGH (ref 0–40)
Albumin: 4.4 g/dL (ref 3.6–4.8)
Alkaline Phosphatase: 68 IU/L (ref 39–117)
Bilirubin Total: 0.4 mg/dL (ref 0.0–1.2)
Bilirubin, Direct: 0.12 mg/dL (ref 0.00–0.40)
Total Protein: 7.2 g/dL (ref 6.0–8.5)

## 2017-06-30 ENCOUNTER — Encounter: Payer: Self-pay | Admitting: Family Medicine

## 2017-07-01 ENCOUNTER — Encounter: Payer: Self-pay | Admitting: Family Medicine

## 2017-08-26 LAB — PSA: PSA: 3.86

## 2017-09-22 ENCOUNTER — Encounter: Payer: Self-pay | Admitting: Family Medicine

## 2017-11-22 ENCOUNTER — Telehealth: Payer: Self-pay | Admitting: Family Medicine

## 2017-11-22 NOTE — Telephone Encounter (Signed)
Please advise.   Copied from [place] [phone]. Topic: Appointment Scheduling - Scheduling Inquiry for Clinic >> Nov 22, 2017  8:44 AM Stanley Boyd wrote: Reason for CRM: PT is transferring to [facility] with Dr Yong Channel,  had a cpe apt 4/29 and is asking if it would be possible to have the labs draw at [facility].

## 2017-11-23 NOTE — Telephone Encounter (Signed)
So if I have seen a patient at [facility] as my patient it is not a "transfer". My panel automatically transferred with me- just so its known- they dont need an establish or transfer visit to see me if they are already my patient.  I am sending this to [place] so they can update PEC overall- also to Aurelio Brash who took phone call   For Roselyn Reef: Probably needs script-   Under hyperlipidemia- cbc, cmp, lipid, poc ua Under BPH- PSA  Any other labs he is requesting?

## 2017-11-24 NOTE — Telephone Encounter (Signed)
This is completed regarding the PEC.

## 2017-11-25 NOTE — Telephone Encounter (Signed)
Order faxed as requested

## 2017-11-28 ENCOUNTER — Other Ambulatory Visit: Payer: Self-pay | Admitting: Family Medicine

## 2017-11-29 LAB — URINALYSIS, ROUTINE W REFLEX MICROSCOPIC
Bilirubin, UA: NEGATIVE
Glucose, UA: NEGATIVE
Ketones, UA: NEGATIVE
Leukocytes, UA: NEGATIVE
Nitrite, UA: NEGATIVE
Protein, UA: NEGATIVE
RBC, UA: NEGATIVE
Specific Gravity, UA: 1.021 (ref 1.005–1.030)
Urobilinogen, Ur: 0.2 mg/dL (ref 0.2–1.0)
pH, UA: 5.5 (ref 5.0–7.5)

## 2017-11-29 LAB — CBC WITH DIFFERENTIAL/PLATELET
Basophils Absolute: 0 10*3/uL (ref 0.0–0.2)
Basos: 1 %
EOS (ABSOLUTE): 0.1 10*3/uL (ref 0.0–0.4)
Eos: 2 %
Hematocrit: 45.8 % (ref 37.5–51.0)
Hemoglobin: 15.8 g/dL (ref 13.0–17.7)
Immature Grans (Abs): 0 10*3/uL (ref 0.0–0.1)
Immature Granulocytes: 0 %
Lymphocytes Absolute: 2.4 10*3/uL (ref 0.7–3.1)
Lymphs: 45 %
MCH: 30.6 pg (ref 26.6–33.0)
MCHC: 34.5 g/dL (ref 31.5–35.7)
MCV: 89 fL (ref 79–97)
Monocytes Absolute: 0.4 10*3/uL (ref 0.1–0.9)
Monocytes: 7 %
Neutrophils Absolute: 2.5 10*3/uL (ref 1.4–7.0)
Neutrophils: 45 %
Platelets: 229 10*3/uL (ref 150–379)
RBC: 5.17 x10E6/uL (ref 4.14–5.80)
RDW: 15 % (ref 12.3–15.4)
WBC: 5.4 10*3/uL (ref 3.4–10.8)

## 2017-11-29 LAB — COMPREHENSIVE METABOLIC PANEL
ALT: 52 IU/L — ABNORMAL HIGH (ref 0–44)
AST: 47 IU/L — ABNORMAL HIGH (ref 0–40)
Albumin/Globulin Ratio: 1.4 (ref 1.2–2.2)
Albumin: 4.3 g/dL (ref 3.6–4.8)
Alkaline Phosphatase: 59 IU/L (ref 39–117)
BUN/Creatinine Ratio: 15 (ref 10–24)
BUN: 13 mg/dL (ref 8–27)
Bilirubin Total: 0.3 mg/dL (ref 0.0–1.2)
CO2: 20 mmol/L (ref 20–29)
Calcium: 9.7 mg/dL (ref 8.6–10.2)
Chloride: 105 mmol/L (ref 96–106)
Creatinine, Ser: 0.87 mg/dL (ref 0.76–1.27)
GFR calc Af Amer: 105 mL/min/{1.73_m2} (ref >59)
GFR calc non Af Amer: 91 mL/min/{1.73_m2} (ref >59)
Globulin, Total: 3.1 g/dL (ref 1.5–4.5)
Glucose: 106 mg/dL — ABNORMAL HIGH (ref 65–99)
Potassium: 4.7 mmol/L (ref 3.5–5.2)
Sodium: 141 mmol/L (ref 134–144)
Total Protein: 7.4 g/dL (ref 6.0–8.5)

## 2017-11-29 LAB — LIPID PANEL W/O CHOL/HDL RATIO
Cholesterol, Total: 208 mg/dL — ABNORMAL HIGH (ref 100–199)
HDL: 37 mg/dL — ABNORMAL LOW (ref >39)
LDL Calculated: 135 mg/dL — ABNORMAL HIGH (ref 0–99)
Triglycerides: 179 mg/dL — ABNORMAL HIGH (ref 0–149)
VLDL Cholesterol Cal: 36 mg/dL (ref 5–40)

## 2017-11-29 LAB — PSA: Prostate Specific Ag, Serum: 4.1 ng/mL — ABNORMAL HIGH (ref 0.0–4.0)

## 2018-02-06 ENCOUNTER — Encounter: Payer: Self-pay | Admitting: Family Medicine

## 2018-02-06 ENCOUNTER — Ambulatory Visit (INDEPENDENT_AMBULATORY_CARE_PROVIDER_SITE_OTHER): Payer: Medicare Other | Admitting: Family Medicine

## 2018-02-06 VITALS — BP 118/66 | HR 53 | Temp 97.5°F | Ht 69.5 in | Wt 188.6 lb

## 2018-02-06 DIAGNOSIS — R351 Nocturia: Secondary | ICD-10-CM

## 2018-02-06 DIAGNOSIS — Z0001 Encounter for general adult medical examination with abnormal findings: Secondary | ICD-10-CM

## 2018-02-06 DIAGNOSIS — N401 Enlarged prostate with lower urinary tract symptoms: Secondary | ICD-10-CM | POA: Diagnosis not present

## 2018-02-06 DIAGNOSIS — I2510 Atherosclerotic heart disease of native coronary artery without angina pectoris: Secondary | ICD-10-CM

## 2018-02-06 DIAGNOSIS — E785 Hyperlipidemia, unspecified: Secondary | ICD-10-CM | POA: Diagnosis not present

## 2018-02-06 DIAGNOSIS — R361 Hematospermia: Secondary | ICD-10-CM | POA: Diagnosis not present

## 2018-02-06 MED ORDER — FLUTICASONE PROPIONATE 50 MCG/ACT NA SUSP: 2.0000 | Freq: Every day | NASAL | 3 refills | Status: DC

## 2018-02-06 NOTE — Assessment & Plan Note (Signed)
HLD-  Controlled on crestor 20mg  with LDL under 70 in past but had LFT elevation and this had to be stopped. Changed to injectable praluent with cardiology 75 mg to 150mg - hasnt started increased dose yet- #s below on the lower dose Lab Results  Component Value Date   CHOL 208 (H) 11/28/2017   HDL 37 (L) 11/28/2017   LDLCALC 135 (H) 11/28/2017   LDLDIRECT 90.0 04/02/2016   TRIG 179 (H) 11/28/2017   CHOLHDL 8 06/24/2015   Lab Results  Component Value Date   ALT 52 (H) 11/28/2017   AST 47 (H) 11/28/2017   ALKPHOS 59 11/28/2017   BILITOT 0.3 11/28/2017  LFTs improving steadily off statin. Hold off on repeat as will need repeat labs for cholesterol after dosage adjustment

## 2018-02-06 NOTE — Patient Instructions (Addendum)
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 I strongly suspect this is from your large prostate but lets be on safe side and get their opinion. Hopefully they will see you before august but that may not be possible.   I will try to get both Shingrix shots once you get on Medicare.  It is generally cheapest to get this at the pharmacy or health department.  There has been a national back order on this though.  Try Flonase for 2 to 3 weeks to see if it improves your eye itching symptoms.  It may not -you can stop at that point.  We just did your labs in February.  I do not feel strongly about repeating any right now until you are on the higher dose of Praluent.  I suspect Dr. Einar Gip will repeat this and liver function test.  Please let me know if you need me to set this up for you.  I would love to have a copy of your labs regardless.  Would be reasonable to do welcome to Medicare exam in 6 months (since you are starting medicare in July).  You can go ahead and sign up for this if you would like.

## 2018-02-06 NOTE — Assessment & Plan Note (Addendum)
BPH- on flomax, likely the cause for PSA elevations. Defers rectal.   He reports when he ejaculates- has blood in it every time. Has august appointment with urology. No dysuria, polyuria, discharge from penis. He has an appointment with urology in August.  Suspect this is due to BPH but we will refer him back to urology under hematospermia at this time.

## 2018-02-06 NOTE — Assessment & Plan Note (Signed)
primary symptoms have been cold sweats and fatigue and has had none of these since last stent (5 total stents over 2 different operations).  No longer on brilinta-stopped in December. Has not had to use nitroglycerin. Taking aspirin. Following with dr. Einar Gip.
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
## 2018-02-27 ENCOUNTER — Encounter: Payer: Self-pay | Admitting: Family Medicine

## 2018-03-30 ENCOUNTER — Encounter: Payer: Self-pay | Admitting: Family Medicine

## 2018-06-26 ENCOUNTER — Other Ambulatory Visit: Payer: Self-pay | Admitting: Family Medicine

## 2018-08-04 ENCOUNTER — Ambulatory Visit: Payer: Medicare Other | Admitting: Family Medicine

## 2018-08-08 ENCOUNTER — Ambulatory Visit: Payer: Medicare Other | Admitting: Family Medicine

## 2019-02-09 ENCOUNTER — Encounter: Payer: Medicare Other | Admitting: Family Medicine

## 2019-02-15 ENCOUNTER — Ambulatory Visit (INDEPENDENT_AMBULATORY_CARE_PROVIDER_SITE_OTHER): Payer: Medicare Other

## 2019-02-15 ENCOUNTER — Other Ambulatory Visit: Payer: Self-pay

## 2019-02-15 ENCOUNTER — Other Ambulatory Visit: Payer: Self-pay | Admitting: Family Medicine

## 2019-02-15 ENCOUNTER — Ambulatory Visit (HOSPITAL_COMMUNITY)
Admission: RE | Admit: 2019-02-15 | Discharge: 2019-02-15 | Disposition: A | Discharge status: Home / Self Care | Payer: Medicare Other | Source: Ambulatory Visit | Attending: Family Medicine | Admitting: Family Medicine

## 2019-02-15 ENCOUNTER — Ambulatory Visit (HOSPITAL_COMMUNITY): Payer: Medicare Other

## 2019-02-15 ENCOUNTER — Ambulatory Visit: Payer: Medicare Other | Admitting: Family Medicine

## 2019-02-15 ENCOUNTER — Encounter: Payer: Self-pay | Admitting: Family Medicine

## 2019-02-15 VITALS — BP 109/60 | HR 57 | Temp 98.0°F | Ht 69.5 in | Wt 179.6 lb

## 2019-02-15 DIAGNOSIS — R29898 Other symptoms and signs involving the musculoskeletal system: Secondary | ICD-10-CM | POA: Diagnosis not present

## 2019-02-15 DIAGNOSIS — I25119 Atherosclerotic heart disease of native coronary artery with unspecified angina pectoris: Secondary | ICD-10-CM | POA: Diagnosis not present

## 2019-02-15 DIAGNOSIS — I69959 Hemiplegia and hemiparesis following unspecified cerebrovascular disease affecting unspecified side: Secondary | ICD-10-CM | POA: Diagnosis not present

## 2019-02-15 DIAGNOSIS — E785 Hyperlipidemia, unspecified: Secondary | ICD-10-CM

## 2019-02-15 LAB — VITAMIN B12: Vitamin B-12: 162 pg/mL — ABNORMAL LOW (ref 211–911)

## 2019-02-15 LAB — COMPREHENSIVE METABOLIC PANEL
ALT: 518 U/L — ABNORMAL HIGH (ref 0–53)
AST: 499 U/L — ABNORMAL HIGH (ref 0–37)
Albumin: 4.2 g/dL (ref 3.5–5.2)
Alkaline Phosphatase: 67 U/L (ref 39–117)
BUN: 16 mg/dL (ref 6–23)
CO2: 31 mEq/L (ref 19–32)
Calcium: 9.4 mg/dL (ref 8.4–10.5)
Chloride: 100 mEq/L (ref 96–112)
Creatinine, Ser: 0.65 mg/dL (ref 0.40–1.50)
GFR: 122.97 mL/min (ref >60.00)
Glucose, Bld: 97 mg/dL (ref 70–99)
Potassium: 4.7 mEq/L (ref 3.5–5.1)
Sodium: 138 mEq/L (ref 135–145)
Total Bilirubin: 0.6 mg/dL (ref 0.2–1.2)
Total Protein: 7.1 g/dL (ref 6.0–8.3)

## 2019-02-15 LAB — CBC WITH DIFFERENTIAL/PLATELET
Basophils Absolute: 0 10*3/uL (ref 0.0–0.1)
Basophils Relative: 0.5 % (ref 0.0–3.0)
Eosinophils Absolute: 0 10*3/uL (ref 0.0–0.7)
Eosinophils Relative: 1.1 % (ref 0.0–5.0)
HCT: 46.1 % (ref 39.0–52.0)
Hemoglobin: 15.6 g/dL (ref 13.0–17.0)
Lymphocytes Relative: 31.5 % (ref 12.0–46.0)
Lymphs Abs: 1.4 10*3/uL (ref 0.7–4.0)
MCHC: 33.8 g/dL (ref 30.0–36.0)
MCV: 92.3 fl (ref 78.0–100.0)
Monocytes Absolute: 0.3 10*3/uL (ref 0.1–1.0)
Monocytes Relative: 6.9 % (ref 3.0–12.0)
Neutro Abs: 2.7 10*3/uL (ref 1.4–7.7)
Neutrophils Relative %: 60 % (ref 43.0–77.0)
Platelets: 224 10*3/uL (ref 150.0–400.0)
RBC: 5 Mil/uL (ref 4.22–5.81)
RDW: 13.2 % (ref 11.5–15.5)
WBC: 4.5 10*3/uL (ref 4.0–10.5)

## 2019-02-15 LAB — LIPID PANEL
Cholesterol: 164 mg/dL (ref 0–200)
HDL: 33.3 mg/dL — ABNORMAL LOW (ref >39.00)
LDL Cholesterol: 102 mg/dL — ABNORMAL HIGH (ref 0–99)
NonHDL: 130.89
Total CHOL/HDL Ratio: 5
Triglycerides: 144 mg/dL (ref 0.0–149.0)
VLDL: 28.8 mg/dL (ref 0.0–40.0)

## 2019-02-15 LAB — C-REACTIVE PROTEIN: CRP: 1.1 mg/dL (ref 0.5–20.0)

## 2019-02-15 LAB — SEDIMENTATION RATE: Sed Rate: 18 mm/hr (ref 0–20)

## 2019-02-15 LAB — TSH: TSH: 4.86 u[IU]/mL — ABNORMAL HIGH (ref 0.35–4.50)
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
## 2019-02-15 NOTE — Patient Instructions (Addendum)
Lab and x-ray before you go  We are going to consider neurology referral as well

## 2019-02-16 ENCOUNTER — Encounter (HOSPITAL_COMMUNITY): Payer: Self-pay | Admitting: Emergency Medicine

## 2019-02-16 ENCOUNTER — Other Ambulatory Visit: Payer: Self-pay

## 2019-02-16 ENCOUNTER — Inpatient Hospital Stay (HOSPITAL_COMMUNITY)
Admission: EM | Admit: 2019-02-16 | Discharge: 2019-02-20 | DRG: 501 | Disposition: A | Discharge status: Home Health | Payer: Medicare Other | Attending: Internal Medicine | Admitting: Internal Medicine

## 2019-02-16 DIAGNOSIS — L988 Other specified disorders of the skin and subcutaneous tissue: Secondary | ICD-10-CM | POA: Diagnosis present

## 2019-02-16 DIAGNOSIS — G8191 Hemiplegia, unspecified affecting right dominant side: Secondary | ICD-10-CM | POA: Diagnosis present

## 2019-02-16 DIAGNOSIS — R748 Abnormal levels of other serum enzymes: Secondary | ICD-10-CM

## 2019-02-16 DIAGNOSIS — Z79899 Other long term (current) drug therapy: Secondary | ICD-10-CM

## 2019-02-16 DIAGNOSIS — R001 Bradycardia, unspecified: Secondary | ICD-10-CM | POA: Diagnosis present

## 2019-02-16 DIAGNOSIS — E785 Hyperlipidemia, unspecified: Secondary | ICD-10-CM | POA: Diagnosis present

## 2019-02-16 DIAGNOSIS — N400 Enlarged prostate without lower urinary tract symptoms: Secondary | ICD-10-CM | POA: Diagnosis present

## 2019-02-16 DIAGNOSIS — Z981 Arthrodesis status: Secondary | ICD-10-CM | POA: Diagnosis not present

## 2019-02-16 DIAGNOSIS — R531 Weakness: Secondary | ICD-10-CM

## 2019-02-16 DIAGNOSIS — M6282 Rhabdomyolysis: Secondary | ICD-10-CM | POA: Diagnosis present

## 2019-02-16 DIAGNOSIS — R21 Rash and other nonspecific skin eruption: Secondary | ICD-10-CM

## 2019-02-16 DIAGNOSIS — R296 Repeated falls: Secondary | ICD-10-CM | POA: Diagnosis present

## 2019-02-16 DIAGNOSIS — Z7951 Long term (current) use of inhaled steroids: Secondary | ICD-10-CM | POA: Diagnosis not present

## 2019-02-16 DIAGNOSIS — G8190 Hemiplegia, unspecified affecting unspecified side: Secondary | ICD-10-CM

## 2019-02-16 DIAGNOSIS — Z9181 History of falling: Secondary | ICD-10-CM | POA: Diagnosis not present

## 2019-02-16 DIAGNOSIS — Z7982 Long term (current) use of aspirin: Secondary | ICD-10-CM

## 2019-02-16 DIAGNOSIS — Z1159 Encounter for screening for other viral diseases: Secondary | ICD-10-CM

## 2019-02-16 DIAGNOSIS — G9589 Other specified diseases of spinal cord: Secondary | ICD-10-CM | POA: Diagnosis present

## 2019-02-16 DIAGNOSIS — E860 Dehydration: Secondary | ICD-10-CM | POA: Diagnosis present

## 2019-02-16 DIAGNOSIS — R0600 Dyspnea, unspecified: Secondary | ICD-10-CM

## 2019-02-16 DIAGNOSIS — Z955 Presence of coronary angioplasty implant and graft: Secondary | ICD-10-CM

## 2019-02-16 DIAGNOSIS — G629 Polyneuropathy, unspecified: Secondary | ICD-10-CM | POA: Diagnosis present

## 2019-02-16 DIAGNOSIS — I2510 Atherosclerotic heart disease of native coronary artery without angina pectoris: Secondary | ICD-10-CM | POA: Diagnosis present

## 2019-02-16 DIAGNOSIS — R7401 Elevation of levels of liver transaminase levels: Secondary | ICD-10-CM | POA: Diagnosis present

## 2019-02-16 DIAGNOSIS — M3320 Polymyositis, organ involvement unspecified: Secondary | ICD-10-CM | POA: Diagnosis not present

## 2019-02-16 DIAGNOSIS — R739 Hyperglycemia, unspecified: Secondary | ICD-10-CM | POA: Diagnosis present

## 2019-02-16 DIAGNOSIS — E7800 Pure hypercholesterolemia, unspecified: Secondary | ICD-10-CM | POA: Diagnosis present

## 2019-02-16 DIAGNOSIS — E538 Deficiency of other specified B group vitamins: Secondary | ICD-10-CM | POA: Diagnosis present

## 2019-02-16 DIAGNOSIS — R740 Nonspecific elevation of levels of transaminase and lactic acid dehydrogenase [LDH]: Secondary | ICD-10-CM | POA: Diagnosis not present

## 2019-02-16 HISTORY — DX: Benign prostatic hyperplasia without lower urinary tract symptoms: N40.0

## 2019-02-16 HISTORY — DX: Dorsalgia, unspecified: M54.9

## 2019-02-16 HISTORY — DX: Other chronic pain: G89.29

## 2019-02-16 LAB — URINALYSIS, ROUTINE W REFLEX MICROSCOPIC
Bilirubin Urine: NEGATIVE
Glucose, UA: NEGATIVE mg/dL
Ketones, ur: NEGATIVE mg/dL
Leukocytes,Ua: NEGATIVE
Nitrite: NEGATIVE
Protein, ur: 100 mg/dL — AB
Specific Gravity, Urine: 1.023 (ref 1.005–1.030)
pH: 5 (ref 5.0–8.0)

## 2019-02-16 LAB — CBC WITH DIFFERENTIAL/PLATELET
Abs Immature Granulocytes: 0.01 10*3/uL (ref 0.00–0.07)
Basophils Absolute: 0 10*3/uL (ref 0.0–0.1)
Basophils Relative: 1 %
Eosinophils Absolute: 0.1 10*3/uL (ref 0.0–0.5)
Eosinophils Relative: 2 %
HCT: 41.2 % (ref 39.0–52.0)
Hemoglobin: 13.7 g/dL (ref 13.0–17.0)
Immature Granulocytes: 0 %
Lymphocytes Relative: 35 %
Lymphs Abs: 1.7 10*3/uL (ref 0.7–4.0)
MCH: 30.5 pg (ref 26.0–34.0)
MCHC: 33.3 g/dL (ref 30.0–36.0)
MCV: 91.8 fL (ref 80.0–100.0)
Monocytes Absolute: 0.5 10*3/uL (ref 0.1–1.0)
Monocytes Relative: 10 %
Neutro Abs: 2.6 10*3/uL (ref 1.7–7.7)
Neutrophils Relative %: 52 %
Platelets: 213 10*3/uL (ref 150–400)
RBC: 4.49 MIL/uL (ref 4.22–5.81)
RDW: 12.4 % (ref 11.5–15.5)
WBC: 4.9 10*3/uL (ref 4.0–10.5)
nRBC: 0 % (ref 0.0–0.2)

## 2019-02-16 LAB — BASIC METABOLIC PANEL
Anion gap: 11 (ref 5–15)
BUN: 11 mg/dL (ref 8–23)
CO2: 27 mmol/L (ref 22–32)
Calcium: 9.2 mg/dL (ref 8.9–10.3)
Chloride: 101 mmol/L (ref 98–111)
Creatinine, Ser: 0.68 mg/dL (ref 0.61–1.24)
GFR calc Af Amer: >60 mL/min (ref >60)
GFR calc non Af Amer: >60 mL/min (ref >60)
Glucose, Bld: 90 mg/dL (ref 70–99)
Potassium: 3.7 mmol/L (ref 3.5–5.1)
Sodium: 139 mmol/L (ref 135–145)

## 2019-02-16 LAB — HEPATIC FUNCTION PANEL
ALT: 504 U/L — ABNORMAL HIGH (ref 0–44)
AST: 469 U/L — ABNORMAL HIGH (ref 15–41)
Albumin: 3.4 g/dL — ABNORMAL LOW (ref 3.5–5.0)
Alkaline Phosphatase: 59 U/L (ref 38–126)
Bilirubin, Direct: 0.1 mg/dL (ref 0.0–0.2)
Indirect Bilirubin: 0.3 mg/dL (ref 0.3–0.9)
Total Bilirubin: 0.4 mg/dL (ref 0.3–1.2)
Total Protein: 6.6 g/dL (ref 6.5–8.1)

## 2019-02-16 LAB — CK
Total CK: 14781 U/L — ABNORMAL HIGH (ref 7–232)
Total CK: 16516 U/L — ABNORMAL HIGH (ref 49–397)

## 2019-02-16 LAB — SARS CORONAVIRUS 2 BY RT PCR (HOSPITAL ORDER, PERFORMED IN ~~LOC~~ HOSPITAL LAB): SARS Coronavirus 2: NEGATIVE

## 2019-02-16 MED ORDER — FINASTERIDE 5 MG PO TABS
5.0000 mg | ORAL_TABLET | Freq: Every day | ORAL | Status: DC
  Administered 2019-02-17 – 2019-02-19 (×3): 5 mg via ORAL
  Filled 2019-02-16 (×4): qty 1

## 2019-02-16 MED ORDER — MORPHINE SULFATE (PF) 2 MG/ML IV SOLN: 2.0000 mg | INTRAVENOUS | Status: DC | PRN

## 2019-02-16 MED ORDER — ONDANSETRON HCL 4 MG PO TABS: 4.0000 mg | ORAL_TABLET | Freq: Four times a day (QID) | ORAL | Status: DC | PRN

## 2019-02-16 MED ORDER — SODIUM CHLORIDE 0.9 % IV SOLN
INTRAVENOUS | Status: DC
  Administered 2019-02-16 – 2019-02-20 (×10): via INTRAVENOUS

## 2019-02-16 MED ORDER — ONDANSETRON HCL 4 MG/2ML IJ SOLN: 4.0000 mg | Freq: Four times a day (QID) | INTRAMUSCULAR | Status: DC | PRN

## 2019-02-16 MED ORDER — TAMSULOSIN HCL 0.4 MG PO CAPS
0.4000 mg | ORAL_CAPSULE | Freq: Every day | ORAL | Status: DC
  Administered 2019-02-16 – 2019-02-19 (×4): 0.4 mg via ORAL
  Filled 2019-02-16 (×4): qty 1

## 2019-02-16 MED ORDER — SODIUM CHLORIDE 0.9 % IV BOLUS (SEPSIS)
1000.0000 mL | Freq: Once | INTRAVENOUS | Status: AC
  Administered 2019-02-16: 1000 mL via INTRAVENOUS

## 2019-02-16 MED ORDER — ENOXAPARIN SODIUM 40 MG/0.4ML ~~LOC~~ SOLN
40.0000 mg | SUBCUTANEOUS | Status: DC
  Administered 2019-02-16 – 2019-02-19 (×4): 40 mg via SUBCUTANEOUS
  Filled 2019-02-16 (×4): qty 0.4

## 2019-02-16 MED ORDER — DOCUSATE SODIUM 100 MG PO CAPS
100.0000 mg | ORAL_CAPSULE | Freq: Two times a day (BID) | ORAL | Status: DC
  Administered 2019-02-18: 100 mg via ORAL
  Filled 2019-02-16 (×6): qty 1

## 2019-02-16 MED ORDER — SODIUM CHLORIDE 0.9 % IV BOLUS
1000.0000 mL | Freq: Once | INTRAVENOUS | Status: AC
  Administered 2019-02-16: 1000 mL via INTRAVENOUS

## 2019-02-16 MED ORDER — ASPIRIN 81 MG PO CHEW
81.0000 mg | CHEWABLE_TABLET | Freq: Every day | ORAL | Status: DC
  Administered 2019-02-17 – 2019-02-19 (×3): 81 mg via ORAL
  Filled 2019-02-16 (×4): qty 1

## 2019-02-16 NOTE — ED Provider Notes (Signed)
[facility]ER[facility]ENC[facility] DEPARTMENT Provider Note   CSN: [id number] Arrival date & time: 02/16/19  1326    History   Chief Complaint Chief Complaint  Patient presents with   Dehydration    HPI Stanley Boyd is a 68 y.o. male with history of hyperlipidemia, coronary artery disease, nephrolithiasis presents for evaluation of acute onset, progressively worsening muscle weakness for the last 4 weeks or so.  He reports that symptoms began more proximally primarily in the lower extremities bilaterally.  He has baseline weakness in his upper extremities bilaterally after a fall several years ago and multiple spine surgeries.  Also reports that his right side is slightly weaker than his left at baseline.  He notes some muscle cramping but denies any numbness or tingling.  He denies significant back pain, bowel or bladder incontinence, saddle paresthesias, fevers, or IV drug use.  Reports that due to his progressively worsening weakness he has had increased falls, 3 in the last month.  Denies any head injury or loss of consciousness associated with these falls.  He has not been walking much as a result of his weakness and reports significant difficulty standing up or changing positions from laying.  Endorses decreased appetite and 11 pound weight loss int he last 2 weeks. Also noted the development of a rash to his chest approximately 2 weeks ago.  It is not pruritic.  No new soaps, shampoos, detergents, or lotions.  No known sick contacts.  No fevers, chills, chest pain, or shortness of breath.  No abdominal pain, nausea, or vomiting.  He went to his PCP yesterday for these complaints who obtained lab work which showed significantly elevated CK and he was instructed to come to the ED for further evaluation.      The history is provided by the patient.    Past Medical History:  Diagnosis Date   BPH (benign prostatic hyperplasia)    Bradycardia    40s to low 50s   Chronic back  pain    multiple surgeries following fall from a deer stand   Coronary artery disease    5 stents   Headache    "probably monthly" (07/08/2015)   Hyperlipidemia    on injectable medication   Kidney stones    "years ago; never had OR/scopes"   Pneumonia 08/2014    Patient Active Problem List   Diagnosis Date Noted   Rhabdomyolysis 02/16/2019   Elevated PSA 02/25/2017   Post PTCA 04/16/2016   CAD (coronary artery disease), native coronary artery 07/13/2015   S/P PTCA (percutaneous transluminal coronary angioplasty) 07/08/2015   History of colonic polyps 10/15/2014   Hemiparesis ([place]) 09/18/2014   BPH (benign prostatic hyperplasia) 09/18/2014   Transaminitis 09/18/2014   Hyperlipidemia    Bradycardia     Past Surgical History:  Procedure Laterality Date   ANTERIOR CERVICAL DECOMP/DISCECTOMY FUSION  49; 65; [place]  2017   CARDIAC CATHETERIZATION N/A 07/08/2015   Procedure: Left Heart Cath and Coronary Angiography;  Surgeon: Adrian Prows, MD;  Location: [facility];  Service: Cardiovascular;  Laterality: N/A;   CARDIAC CATHETERIZATION N/A 07/17/2015   Procedure: Coronary Stent Intervention;  Surgeon: Adrian Prows, MD;  Location: [facility];  Service: Cardiovascular;  Laterality: N/A;   CARDIAC CATHETERIZATION N/A 04/16/2016   Procedure: Left Heart Cath and Coronary Angiography;  Surgeon: Adrian Prows, MD;  Location: [facility];  Service: Cardiovascular;  Laterality: N/A;   CARDIAC CATHETERIZATION N/A 04/16/2016   Procedure: Coronary Stent Intervention;  Surgeon: Adrian Prows, MD;  Location: [facility];  Service: Cardiovascular;  Laterality: N/A;   COLONOSCOPY N/A 04/05/2014   Procedure: COLONOSCOPY;  Surgeon: Danie Binder, MD;  [facility];  Service: Endoscopy;  Laterality: N/A;  10:30 AM   CORONARY ANGIOPLASTY WITH STENT PLACEMENT  07/08/2015   "2 stents"   CORONARY  STENT PLACEMENT  04/16/2016   PTCA and stenting of the ostial LAD with implantation of a 3.0 x 12 mm resolute integrity DES, stenosis reduced from 80% to 0% with maintenance of TIMI-3 flow. Ostial OM1 in-stent restenosis reduced to less than 20% with a 2.0 x 12 mm emerge balloon at 14 atmospheric pressure   [place]   PTCA  07/17/2015   OMI    DES        Home Medications    Prior to Admission medications   Medication Sig Start Date End Date Taking? Authorizing Provider  acetaminophen (TYLENOL) 500 MG tablet Take 1,000 mg by mouth every 6 (six) hours as needed for headache (pain).   Yes [provider]  Alirocumab (PRALUENT) 150 MG/ML SOPN Inject 150 mg as directed every 14 (fourteen) days.  10/11/17  Yes [provider]  amoxicillin (AMOXIL) 500 MG capsule Take 2,000 mg by mouth See admin instructions. Take 4 capsules (2000 mg) by mouth one hours prior to dental appointments   Yes [provider]  aspirin 81 MG chewable tablet Chew 1 tablet (81 mg total) by mouth daily. Patient taking differently: Chew 81 mg by mouth at bedtime.  07/09/15  Yes Neldon Labella, NP  finasteride (PROSCAR) 5 MG tablet Take 5 mg by mouth at bedtime.  02/08/19  Yes [provider]  fluticasone (FLONASE) 50 MCG/ACT nasal spray Place 2 sprays into both nostrils daily. Patient taking differently: Place 2 sprays into both nostrils daily as needed for allergies or rhinitis.  02/06/18  Yes Marin Olp, MD  nitroGLYCERIN (NITROSTAT) 0.4 MG SL tablet Place 1 tablet (0.4 mg total) under the tongue every 5 (five) minutes as needed for chest pain. 07/09/15  Yes Adrian Prows, MD  Omega-3 Fatty Acids (FISH OIL PO) Take 2 capsules by mouth at bedtime.    Yes [provider]  Polyvinyl Alcohol-Povidone (REFRESH OP) Place 1 drop into both eyes daily.   Yes [provider]  tamsulosin (FLOMAX) 0.4 MG CAPS capsule TAKE 1 CAPSULE BY MOUTH ONCE DAILY WITH  SUPPER Patient taking differently: Take 0.4 mg by mouth at bedtime.  06/26/18  Yes Marin Olp, MD    Family History Family History  Problem Relation Age of Onset   Alzheimer's disease Mother    Colon cancer Other        grandmother    Social History Social History   Tobacco Use   Smoking status: Never Smoker   Smokeless tobacco: Never Used  Substance Use Topics   Alcohol use: No    Alcohol/week: 0.0 standard drinks   Drug use: No     Allergies   Patient has no known allergies.   Review of Systems Review of Systems  Constitutional: Positive for appetite change and fatigue. Negative for chills and fever.  Respiratory: Negative for shortness of breath.   Cardiovascular: Negative for chest pain.  Gastrointestinal: Negative for abdominal pain, nausea and vomiting.  Musculoskeletal: Positive for arthralgias.  Skin: Positive for rash.  Neurological: Positive for weakness. Negative for light-headedness and numbness.  All other systems reviewed and are negative.    Physical Exam Updated Vital Signs BP 121/63    Pulse (!) 59    Temp 98.1 F (36.7 C) (Oral)    Resp 17    Ht 5\' 9"  (1.753 m)    Wt 81.2 kg    SpO2 98%    BMI 26.43 kg/m   Physical Exam Vitals signs and nursing note reviewed.  Constitutional:      General: He is not in acute distress.    Appearance: He is well-developed.  HENT:     Head: Normocephalic and atraumatic.  Eyes:     General:        Right eye: No discharge.        Left eye: No discharge.     Conjunctiva/sclera: Conjunctivae normal.  Neck:     Vascular: No JVD.     Trachea: No tracheal deviation.  Cardiovascular:     Rate and Rhythm: Normal rate and regular rhythm.  Pulmonary:     Effort: Pulmonary effort is normal.     Breath sounds: Normal breath sounds.  Abdominal:     General: Abdomen is flat. Bowel sounds are normal. There is no distension.     Palpations: Abdomen is soft.     Tenderness: There is no abdominal  tenderness. There is no guarding or rebound.  Musculoskeletal:     Comments: No midline spine TTP, no paraspinal muscle tenderness, no deformity, crepitus, or step-off noted.  4/5 strength of hip flexors and deltoids bilaterally.  Patient has difficulty with sustained hip flexion against gravity.    Skin:    General: Skin is warm and dry.     Findings: Rash present. No erythema.     Comments: See image below, patient with papular rash to the chest, Nikolsky sign absent.  No excoriations or vesicles.  Neurological:     Mental Status: He is alert.     Comments: Sensation intact to soft touch of bilateral upper and lower extremities.   Psychiatric:        Behavior: Behavior normal.        ED Treatments / Results  Labs (all labs ordered are listed, but only abnormal results are displayed) Labs Reviewed  CK - Abnormal; Notable for the following components:      Result Value   Total CK 16,516 (*)    All other components within normal limits  HEPATIC FUNCTION PANEL - Abnormal; Notable for the following components:   Albumin 3.4 (*)    AST 469 (*)    ALT 504 (*)    All other components within normal limits  SARS CORONAVIRUS 2 (HOSPITAL ORDER, [facility])  CBC WITH DIFFERENTIAL/PLATELET  BASIC METABOLIC PANEL  URINALYSIS, ROUTINE W REFLEX MICROSCOPIC    EKG None  Radiology Dg Lumbar Spine Complete  Result Date: 02/15/2019 CLINICAL DATA:  Lower extremity weakness. EXAM: LUMBAR SPINE - COMPLETE 4+ VIEW COMPARISON:  CT and pelvis dated 03/03/2018 FINDINGS: There is no displaced fracture. Degenerative changes are noted of the lumbar spine with moderate to severe disc height loss at the L5-S1 level. There is facet arthrosis of the lower lumbar segments. Atherosclerotic changes are noted of the abdominal aorta. A phlebolith projects over the patient's left hemipelvis. A moderate amount of stool is noted in the colon. IMPRESSION: 1. No acute osseous abnormality.  2. Multilevel degenerative changes, greatest at the L5-S1 level. 3.  Atherosclerotic changes of the abdominal aorta. Electronically Signed   By: Constance Holster M.D.   On: 02/15/2019 17:38    Procedures Procedures (including critical care time)  Medications Ordered in ED Medications  sodium chloride 0.9 % bolus 1,000 mL (0 mLs Intravenous Stopped 02/16/19 1517)  sodium chloride 0.9 % bolus 1,000 mL (1,000 mLs Intravenous New Bag/Given 02/16/19 1537)     Initial Impression / Assessment and Plan / ED Course  I have reviewed the triage vital signs and the nursing notes.  Pertinent labs & imaging results that were available during my care of the patient were reviewed by me and considered in my medical decision making (see chart for details).       Patient presents sent from his PCP for evaluation of abnormal lab work and progressively worsening muscle weakness.  He is afebrile, vital signs are stable.  Nontoxic in appearance.  Lab work reviewed by me shows no renal insufficiency, persistently elevated LFTs and markedly elevated CK worsening from lab work performed yesterday.  No red flag signs concerning for cauda equina or spinal abscess.  Differential includes dermatomyositis, polymyositis, rhabdomyolysis.  Will give IV fluids in the ED.  Spoke with Dr. Lorin Mercy with [facility] hospitalist service who agrees to assume care of patient and bring him into the hospital for further evaluation and management.  Final Clinical Impressions(s) / ED Diagnoses   Final diagnoses:  Elevated CK  Rash    ED Discharge Orders    None       Debroah Baller 02/16/19 1637    Valarie Merino, MD 02/19/19 2222

## 2019-02-16 NOTE — ED Notes (Signed)
ED TO INPATIENT HANDOFF REPORT  ED Nurse Name and Phone #:  Percell Locus, RN  S Name/Age/Gender Stanley Boyd 68 y.o. male Room/Bed: 785Y/850Y  Code Status   Code Status: Prior  Home/SNF/Other Home Patient oriented to: self, place, time and situation Is this baseline? Yes   Triage Complete: Triage complete  Chief Complaint dehydration  Triage Note Pt here for eval of leg weakness bilaterally. Pt also reports CK levels were elevated when he had labs drawn at PCP. Pt states his urine has been orange. Pt also reports multiple falls. Does not use a walker or a cane.    Allergies No Known Allergies  Level of Care/Admitting Diagnosis ED Disposition    ED Disposition Condition [facility]: [facility] [[id number]]  Level of Care: Telemetry Medical [104]  Covid Evaluation: Screening Protocol (No Symptoms)  Diagnosis: Rhabdomyolysis [728.88.ICD-9-CM]  Admitting Physician: Karmen Bongo [[id number]]  Attending Physician: Karmen Bongo [[id number]]  Estimated length of stay: 3 - 4 days  Certification:: I certify this patient will need inpatient services for at least 2 midnights  PT Class (Do Not Modify): Inpatient [101]  PT Acc Code (Do Not Modify): Private [1]       B Medical/Surgery History Past Medical History:  Diagnosis Date  . BPH (benign prostatic hyperplasia)   . Bradycardia    40s to low 50s  . Chronic back pain    multiple surgeries following fall from a deer stand  . Coronary artery disease    5 stents  . Headache    "probably monthly" (07/08/2015)  . Hyperlipidemia    on injectable medication  . Kidney stones    "years ago; never had OR/scopes"  . Pneumonia 08/2014   Past Surgical History:  Procedure Laterality Date  . ANTERIOR CERVICAL DECOMP/DISCECTOMY FUSION  37; 51; [place]    . BIOPSY PROSTATE  2017  . CARDIAC CATHETERIZATION N/A 07/08/2015   Procedure: Left Heart Cath and Coronary  Angiography;  Surgeon: Adrian Prows, MD;  Location: [facility];  Service: Cardiovascular;  Laterality: N/A;  . CARDIAC CATHETERIZATION N/A 07/17/2015   Procedure: Coronary Stent Intervention;  Surgeon: Adrian Prows, MD;  Location: [facility];  Service: Cardiovascular;  Laterality: N/A;  . CARDIAC CATHETERIZATION N/A 04/16/2016   Procedure: Left Heart Cath and Coronary Angiography;  Surgeon: Adrian Prows, MD;  Location: [facility];  Service: Cardiovascular;  Laterality: N/A;  . CARDIAC CATHETERIZATION N/A 04/16/2016   Procedure: Coronary Stent Intervention;  Surgeon: Adrian Prows, MD;  Location: [facility];  Service: Cardiovascular;  Laterality: N/A;  . COLONOSCOPY N/A 04/05/2014   Procedure: COLONOSCOPY;  Surgeon: Danie Binder, MD;  [facility];  Service: Endoscopy;  Laterality: N/A;  10:30 AM  . CORONARY ANGIOPLASTY WITH STENT PLACEMENT  07/08/2015   "2 stents"  . CORONARY STENT PLACEMENT  04/16/2016   PTCA and stenting of the ostial LAD with implantation of a 3.0 x 12 mm resolute integrity DES, stenosis reduced from 80% to 0% with maintenance of TIMI-3 flow. Ostial OM1 in-stent restenosis reduced to less than 20% with a 2.0 x 12 mm emerge balloon at 14 atmospheric pressure  . [facility]  1984  . PTCA  07/17/2015   OMI    DES     A IV Location/Drains/Wounds Patient Lines/Drains/Airways Status   Active Line/Drains/Airways    Name:   Placement date:  Placement time:   Site:   Days:   Peripheral IV 02/16/19 Left Forearm   02/16/19    1439    Forearm   less than 1          Intake/Output Last 24 hours No intake or output data in the 24 hours ending 02/16/19 1702  Labs/Imaging Results for orders placed or performed during the hospital encounter of 02/16/19 (from the past 48 hour(s))  CBC with Differential     Status: None   Collection Time: 02/16/19  2:00 PM  Result Value Ref Range   WBC 4.9 4.0 - 10.5 K/uL   RBC 4.49 4.22 - 5.81 MIL/uL    Hemoglobin 13.7 13.0 - 17.0 g/dL   HCT 41.2 39.0 - 52.0 %   MCV 91.8 80.0 - 100.0 fL   MCH 30.5 26.0 - 34.0 pg   MCHC 33.3 30.0 - 36.0 g/dL   RDW 12.4 11.5 - 15.5 %   Platelets 213 150 - 400 K/uL   nRBC 0.0 0.0 - 0.2 %   Neutrophils Relative % 52 %   Neutro Abs 2.6 1.7 - 7.7 K/uL   Lymphocytes Relative 35 %   Lymphs Abs 1.7 0.7 - 4.0 K/uL   Monocytes Relative 10 %   Monocytes Absolute 0.5 0.1 - 1.0 K/uL   Eosinophils Relative 2 %   Eosinophils Absolute 0.1 0.0 - 0.5 K/uL   Basophils Relative 1 %   Basophils Absolute 0.0 0.0 - 0.1 K/uL   Immature Granulocytes 0 %   Abs Immature Granulocytes 0.01 0.00 - 0.07 K/uL    Comment: Performed at [facility], [address] [place], [place] [address]  Basic metabolic panel     Status: None   Collection Time: 02/16/19  2:00 PM  Result Value Ref Range   Sodium 139 135 - 145 mmol/L   Potassium 3.7 3.5 - 5.1 mmol/L   Chloride 101 98 - 111 mmol/L   CO2 27 22 - 32 mmol/L   Glucose, Bld 90 70 - 99 mg/dL   BUN 11 8 - 23 mg/dL   Creatinine, Ser 0.68 0.61 - 1.24 mg/dL   Calcium 9.2 8.9 - 10.3 mg/dL   GFR calc non Af Amer >60 >60 mL/min   GFR calc Af Amer >60 >60 mL/min   Anion gap 11 5 - 15    Comment: Performed at [facility] [address] [place], [place] [address]  CK     Status: Abnormal   Collection Time: 02/16/19  2:00 PM  Result Value Ref Range   Total CK 16,516 (H) 49 - 397 U/L    Comment: RESULTS CONFIRMED BY MANUAL DILUTION Performed at [facility], [place] [address], [place], [place] [address]   Hepatic function panel     Status: Abnormal   Collection Time: 02/16/19  3:24 PM  Result Value Ref Range   Total Protein 6.6 6.5 - 8.1 g/dL   Albumin 3.4 (L) 3.5 - 5.0 g/dL   AST 469 (H) 15 - 41 U/L   ALT 504 (H) 0 - 44 U/L   Alkaline Phosphatase 59 38 - 126 U/L   Total Bilirubin 0.4 0.3 - 1.2 mg/dL   Bilirubin, Direct 0.1 0.0 - 0.2 mg/dL   Indirect Bilirubin 0.3 0.3 - 0.9 mg/dL    Comment: Performed at [facility] [address], [place], [place] [address]   Dg Lumbar Spine Complete  Result Date: 02/15/2019 CLINICAL DATA:  Lower extremity weakness. EXAM: LUMBAR SPINE - COMPLETE 4+ VIEW COMPARISON:  CT and pelvis dated 03/03/2018 FINDINGS: There is no displaced fracture. Degenerative changes are noted of the lumbar spine with moderate to severe disc height loss at the L5-S1 level. There is facet arthrosis of the lower lumbar segments. Atherosclerotic changes are noted of the abdominal aorta. A phlebolith projects over the patient's left hemipelvis. A moderate amount of stool is noted in the colon. IMPRESSION: 1. No acute osseous abnormality. 2. Multilevel degenerative changes, greatest at the L5-S1 level. 3. Atherosclerotic changes of the abdominal aorta. Electronically Signed   By: Constance Holster M.D.   On: 02/15/2019 17:38    Pending Labs Unresulted Labs (From admission, onward)    Start     Ordered   02/16/19 1627  Urinalysis, Routine w reflex microscopic  Once,   R     02/16/19 1626   02/16/19 1621  SARS Coronavirus 2 (CEPHEID - Performed in [facility] hospital lab), Hosp Order  (Asymptomatic Patients Labs)  Once,   R    Question:  Rule Out  Answer:  Yes   02/16/19 1621          Vitals/Pain Today's Vitals   02/16/19 1530 02/16/19 1600 02/16/19 1615 02/16/19 1630  BP: 119/64 115/63 121/63 (!) 141/70  Pulse: (!) 47 (!) 59 (!) 59 60  Resp:    16  Temp:      TempSrc:      SpO2: 97% 98% 98% 100%  Weight:      Height:      PainSc:        Isolation Precautions No active isolations  Medications Medications  sodium chloride 0.9 % bolus 1,000 mL (0 mLs Intravenous Stopped 02/16/19 1517)  sodium chloride 0.9 % bolus 1,000 mL (0 mLs Intravenous Stopped 02/16/19 1659)    Mobility walks with device High fall risk   Focused Assessments Cardiac Assessment Handoff:  Cardiac Rhythm: Normal sinus rhythm Lab Results  Component Value Date   CKTOTAL 16,516 (H) 02/16/2019   No  results found for: DDIMER Does the Patient currently have chest pain? No     R Recommendations: See Admitting Provider Note  Report given to:   Additional Notes:

## 2019-02-16 NOTE — Progress Notes (Signed)
Medications have been sent down to pharmacy in a secure bag.

## 2019-02-16 NOTE — Consult Note (Addendum)
NEURO HOSPITALIST CONSULT NOTE   Requestig physician: Dr. Lorin Mercy   Reason for Consult:  Progressively worsening muscle weakness   History obtained from:  Patient     HPI:                                                                                                                                          Stanley Boyd is an 68 y.o. male  With PMH  HLD, CAD, HA, bradycardia presents to [facility] ED with c/o progressively worsening muscle weakness for the past 4 weeks. Patient had a history of cervical spine injury with paraplegia that improved over many months to years and has residual right-sided weakness but has noticed difficulty in both lower extremities that has been progressively going on now for 4 weeks.  He came in to the emergency room and his laboratory findings were concerning for severely elevated CK and was admitted to the medicine service.  Due to the lower extremity weakness that started down below in the feet and may be then increased to involve the calves, concern for Guillain-Barr syndrome versus laboratory abnormalities concerning for any sort of myopathy-neurological consultation was obtained. Patient denies any tingling numbness weakness.  He denies any respiratory compromise or increased shortness of breath chest pain.  He denies any bowel bladder incontinence. He does report increasing falls over the past 4 to 5 weeks. He is on injectable alirocumab since at least 1 year with no problems. He does report some weight loss over the past few weeks to months which has been unintentional but attributes it to decreased appetite and has not been hydrating himself well as well. Event was primary care provider yesterday and that is when the elevated CKs were found and he was sent to ED for further evaluation He has also been complaining of a rash on his chest and back for the past 4 to 6 weeks.  ED course: CK: 16,516 (increased since yesterday) BMP: WNL AST: 469,  ALT: 504, albumin 3.4 BP: 141/70 Past Medical History:  Diagnosis Date  . BPH (benign prostatic hyperplasia)   . Bradycardia    40s to low 50s  . Chronic back pain    multiple surgeries following fall from a deer stand  . Coronary artery disease    5 stents  . Headache    "probably monthly" (07/08/2015)  . Hyperlipidemia    on injectable medication  . Kidney stones    "years ago; never had OR/scopes"  . Pneumonia 08/2014    Past Surgical History:  Procedure Laterality Date  . ANTERIOR CERVICAL DECOMP/DISCECTOMY FUSION  44; 59; [place]    . BIOPSY PROSTATE  2017  . CARDIAC CATHETERIZATION N/A 07/08/2015   Procedure: Left Heart  Cath and Coronary Angiography;  Surgeon: Adrian Prows, MD;  Location: [place] [facility];  Service: Cardiovascular;  Laterality: N/A;  . CARDIAC CATHETERIZATION N/A 07/17/2015   Procedure: Coronary Stent Intervention;  Surgeon: Adrian Prows, MD;  Location: [place] [facility];  Service: Cardiovascular;  Laterality: N/A;  . CARDIAC CATHETERIZATION N/A 04/16/2016   Procedure: Left Heart Cath and Coronary Angiography;  Surgeon: Adrian Prows, MD;  Location: [place] [facility];  Service: Cardiovascular;  Laterality: N/A;  . CARDIAC CATHETERIZATION N/A 04/16/2016   Procedure: Coronary Stent Intervention;  Surgeon: Adrian Prows, MD;  Location: [place] [facility];  Service: Cardiovascular;  Laterality: N/A;  . COLONOSCOPY N/A 04/05/2014   Procedure: COLONOSCOPY;  Surgeon: Danie Binder, MD;  Location: [facility];  Service: Endoscopy;  Laterality: N/A;  10:30 AM  . CORONARY ANGIOPLASTY WITH STENT PLACEMENT  07/08/2015   "2 stents"  . CORONARY STENT PLACEMENT  04/16/2016   PTCA and stenting of the ostial LAD with implantation of a 3.0 x 12 mm resolute integrity DES, stenosis reduced from 80% to 0% with maintenance of TIMI-3 flow. Ostial OM1 in-stent restenosis reduced to less than 20% with a 2.0 x 12 mm emerge balloon at 14 atmospheric pressure   . [place] SURGERY  1984  . PTCA  07/17/2015   OMI    DES    Family History  Problem Relation Age of Onset  . Alzheimer's disease Mother   . Colon cancer Other        grandmother          Social History:  reports that he has never smoked. He has never used smokeless tobacco. He reports that he does not drink alcohol or use drugs.  No Known Allergies  MEDICATIONS:                                                                                                                     No current facility-administered medications for this encounter.    Current Outpatient Medications  Medication Sig Dispense Refill  . acetaminophen (TYLENOL) 500 MG tablet Take 1,000 mg by mouth every 6 (six) hours as needed for headache (pain).    . Alirocumab (PRALUENT) 150 MG/ML SOPN Inject 150 mg as directed every 14 (fourteen) days.     Marland Kitchen amoxicillin (AMOXIL) 500 MG capsule Take 2,000 mg by mouth See admin instructions. Take 4 capsules (2000 mg) by mouth one hours prior to dental appointments    . aspirin 81 MG chewable tablet Chew 1 tablet (81 mg total) by mouth daily. (Patient taking differently: Chew 81 mg by mouth at bedtime. ) 30 tablet 0  . finasteride (PROSCAR) 5 MG tablet Take 5 mg by mouth at bedtime.     . fluticasone (FLONASE) 50 MCG/ACT nasal spray Place 2 sprays into both nostrils daily. (Patient taking differently: Place 2 sprays into both nostrils daily as needed for allergies or rhinitis. ) 16 g 3  . nitroGLYCERIN (NITROSTAT) 0.4 MG SL tablet Place  1 tablet (0.4 mg total) under the tongue every 5 (five) minutes as needed for chest pain. 25 tablet 4  . Omega-3 Fatty Acids (FISH OIL PO) Take 2 capsules by mouth at bedtime.     . Polyvinyl Alcohol-Povidone (REFRESH OP) Place 1 drop into both eyes daily.    . tamsulosin (FLOMAX) 0.4 MG CAPS capsule TAKE 1 CAPSULE BY MOUTH ONCE DAILY WITH SUPPER (Patient taking differently: Take 0.4 mg by mouth at bedtime. ) 90 capsule 3      ROS:                                                                                                                                        ROS was performed and is negative except as noted in HPI    Blood pressure (!) 141/70, pulse 60, temperature 98.1 F (36.7 C), temperature source Oral, resp. rate 16, height 5\' 9"  (1.753 m), weight 81.2 kg, SpO2 100 %.   General Examination:                                                                                                       Physical Exam  HEENT-  Normocephalic, no lesions, without obvious abnormality.  Normal external eye and conjunctiva.   Cardiovascular-  pulses palpable throughout   Lungs-no rhonchi or wheezing noted, no excessive working breathing.  Saturations within normal limits on RA Extremities- Warm, dry and intact Skin-warm and dry, non itchy erythematous rash across chest down to rib  Cage. Few areas of the same rash on left arm. No rash on lower body or back  Neurological Examination Mental Status: Alert, oriented, thought content appropriate.  Speech fluent without evidence of aphasia.  Able to follow commands without difficulty. Cranial Nerves: II:  Visual fields grossly normal,  III,IV, VI: ptosis not present, extra-ocular motions intact bilaterally pupils equal, round, reactive to light and accommodation V,VII: smile symmetric, facial light touch sensation normal bilaterally VIII: hearing normal bilaterally IX,X: uvula rises symmetrically XI: bilateral shoulder shrug XII: midline tongue extension Motor: Right upper extremity- 4+/5 at the shoulder, 3/5 with the right hand grip. Left upper extremity: Nearly 5/5 symmetrically but some fatigability at the shoulder Right lower extremity: Able to give me nearly 4+/5 at the hip but is definitely stronger distally than proximally with nearly 5/5 at the right knee, plantar and dorsiflexion. Left lower extremity again mildly weak 4+/5 at the hip but very strong distally. Sensory: BLE   Right side  with decreased sensation compared to left. This is also baseline from previous injury. Patient also has trouble with cool temp differentiation in BLE which he reports is baseline.  Light touch and vibratory sense are also decreased in a stocking type pattern in both lower extremities. Deep Tendon Reflexes: He has brisk DTRs at the biceps, brachioradialis, knee and ankles with no clonus.  Toes are downgoing bilaterally Cerebellar: normal finger-to-nose, he is not able to perform heel-knee-shin very well either on the right or on the left Gait: Walk normally to the bathroom with the support of the IV pole with some cautious steps.  Lab Results: Basic Metabolic Panel: Recent Labs  Lab 02/15/19 1203 02/16/19 1400  NA 138 139  K 4.7 3.7  CL 100 101  CO2 31 27  GLUCOSE 97 90  BUN 16 11  CREATININE 0.65 0.68  CALCIUM 9.4 9.2    CBC: Recent Labs  Lab 02/15/19 1203 02/16/19 1400  WBC 4.5 4.9  NEUTROABS 2.7 2.6  HGB 15.6 13.7  HCT 46.1 41.2  MCV 92.3 91.8  PLT 224.0 213    Cardiac Enzymes: Recent Labs  Lab 02/15/19 1203 02/16/19 1400  CKTOTAL 14,781* 16,516*    Lipid Panel: Recent Labs  Lab 02/15/19 1203  CHOL 164  TRIG 144.0  HDL 33.30*  CHOLHDL 5  VLDL 28.8  LDLCALC 102*    Imaging: Dg Lumbar Spine Complete  Result Date: 02/15/2019 CLINICAL DATA:  Lower extremity weakness. EXAM: LUMBAR SPINE - COMPLETE 4+ VIEW COMPARISON:  CT and pelvis dated 03/03/2018 FINDINGS: There is no displaced fracture. Degenerative changes are noted of the lumbar spine with moderate to severe disc height loss at the L5-S1 level. There is facet arthrosis of the lower lumbar segments. Atherosclerotic changes are noted of the abdominal aorta. A phlebolith projects over the patient's left hemipelvis. A moderate amount of stool is noted in the colon. IMPRESSION: 1. No acute osseous abnormality. 2. Multilevel degenerative changes, greatest at the L5-S1 level. 3. Atherosclerotic changes  of the abdominal aorta. Electronically Signed   By: Constance Holster M.D.   On: 02/15/2019 17:38     Laurey Morale, MSN, NP-C [facility] [phone]  Attending neurologist's note to follow  Assessment:  Stanley Boyd is an 68 y.o. male  With PMH  HLD, CAD, HA, bradycardia presents to [facility] ED with c/o progressively worsening muscle weakness for the past 4 weeks. At baseline, he does have some right-sided weakness from his prior C-spine injury. On my examination there is a little more of proximal weakness than distal weakness although it seems that the exam is symmetric when it comes to right left comparison but I am particularly concerned about the proximal hip weakness with nearly normal knee and ankle strength bilaterally. This could all be from the rhabdomyolysis but given history of unintentional weight loss, this could also represent Lambert-Eaton myasthenic syndrome.  Inflammatory myopathies are to be kept in differentials as well. His exam is confusing because of prior spinal injury and hyperreflexia which I think is residual.  I do not think he has Guillain-Barr syndrome  Recommendations: --PT/OT --Aggressive hydration with IV fluids for the rhabdomyolysis -repeat CK -Hold Praluent -Check serum aldolase, voltage-gated calcium channel antibodies -MRI C, T and L-spine -Outpatient EMG nerve conduction studies-Dr. Narda Amber at [facility] neurology -Due to the concern for weight loss, I would recommend a pan scan to rule out malignancy (specifically small cell lung cancer that has most common association with Lambert-Eaton.)-Will defer to primary  team -Also consider paraneoplastic panel testing-will defer to morning rounding and primary team discussion  02/16/2019, 4:48 PM  -- Amie Portland, MD [facility] Pager: [phone] If 7pm to 7am, please call on call as listed on AMION.
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
## 2019-02-16 NOTE — ED Notes (Signed)
Admitting provider at pt bedside. 

## 2019-02-16 NOTE — Progress Notes (Signed)
Oh! This is very high. I will discuss with Thersa Salt, DO. It may be appropriate to send to hospital for hydration. Otherwise stat CK on Monday and see what happens along with BMP

## 2019-02-16 NOTE — ED Triage Notes (Signed)
Pt here for eval of leg weakness bilaterally. Pt also reports CK levels were elevated when he had labs drawn at PCP. Pt states his urine has been orange. Pt also reports multiple falls. Does not use a walker or a cane.

## 2019-02-16 NOTE — ED Notes (Signed)
Attempted report x1. 

## 2019-02-16 NOTE — Progress Notes (Signed)
Pt refusing bed alarm and low bed.

## 2019-02-16 NOTE — Progress Notes (Signed)
Spoke to his son, I will see him tomorrow. Thanks for all you do.

## 2019-02-16 NOTE — Progress Notes (Signed)
Pt states he has already taken his night medications. Pt states when he got to the floor he took his night medications. RN took medications and will call pharmacy. Medications are not in labeled pill bottles but in a daily organizer. Pt educated on why he must not take own medication while in the hospital. Pt refusing states "If yall stop giving me only one medications at 6pm and give me all of them at the same time". Will continue to monitor pt.

## 2019-02-17 ENCOUNTER — Encounter (HOSPITAL_COMMUNITY): Payer: Self-pay | Admitting: Radiology

## 2019-02-17 ENCOUNTER — Inpatient Hospital Stay (HOSPITAL_COMMUNITY): Payer: Medicare Other

## 2019-02-17 DIAGNOSIS — N400 Enlarged prostate without lower urinary tract symptoms: Secondary | ICD-10-CM

## 2019-02-17 DIAGNOSIS — I2510 Atherosclerotic heart disease of native coronary artery without angina pectoris: Secondary | ICD-10-CM

## 2019-02-17 DIAGNOSIS — M6282 Rhabdomyolysis: Principal | ICD-10-CM

## 2019-02-17 DIAGNOSIS — R001 Bradycardia, unspecified: Secondary | ICD-10-CM

## 2019-02-17 DIAGNOSIS — E7800 Pure hypercholesterolemia, unspecified: Secondary | ICD-10-CM

## 2019-02-17 LAB — CBC
HCT: 39 % (ref 39.0–52.0)
Hemoglobin: 13 g/dL (ref 13.0–17.0)
MCH: 30.6 pg (ref 26.0–34.0)
MCHC: 33.3 g/dL (ref 30.0–36.0)
MCV: 91.8 fL (ref 80.0–100.0)
Platelets: 196 10*3/uL (ref 150–400)
RBC: 4.25 MIL/uL (ref 4.22–5.81)
RDW: 12.4 % (ref 11.5–15.5)
WBC: 4.6 10*3/uL (ref 4.0–10.5)
nRBC: 0 % (ref 0.0–0.2)

## 2019-02-17 LAB — COMPREHENSIVE METABOLIC PANEL
ALT: 465 U/L — ABNORMAL HIGH (ref 0–44)
ALT: 493 U/L — ABNORMAL HIGH (ref 0–44)
AST: 475 U/L — ABNORMAL HIGH (ref 15–41)
AST: 490 U/L — ABNORMAL HIGH (ref 15–41)
Albumin: 3.1 g/dL — ABNORMAL LOW (ref 3.5–5.0)
Albumin: 3.4 g/dL — ABNORMAL LOW (ref 3.5–5.0)
Alkaline Phosphatase: 52 U/L (ref 38–126)
Alkaline Phosphatase: 57 U/L (ref 38–126)
Anion gap: 5 (ref 5–15)
Anion gap: 8 (ref 5–15)
BUN: 10 mg/dL (ref 8–23)
BUN: 10 mg/dL (ref 8–23)
CO2: 27 mmol/L (ref 22–32)
CO2: 27 mmol/L (ref 22–32)
Calcium: 8.6 mg/dL — ABNORMAL LOW (ref 8.9–10.3)
Calcium: 8.9 mg/dL (ref 8.9–10.3)
Chloride: 107 mmol/L (ref 98–111)
Chloride: 103 mmol/L (ref 98–111)
Creatinine, Ser: 0.62 mg/dL (ref 0.61–1.24)
Creatinine, Ser: 0.64 mg/dL (ref 0.61–1.24)
GFR calc Af Amer: >60 mL/min (ref >60)
GFR calc Af Amer: >60 mL/min (ref >60)
GFR calc non Af Amer: >60 mL/min (ref >60)
GFR calc non Af Amer: >60 mL/min (ref >60)
Glucose, Bld: 94 mg/dL (ref 70–99)
Glucose, Bld: 95 mg/dL (ref 70–99)
Potassium: 3.8 mmol/L (ref 3.5–5.1)
Potassium: 3.9 mmol/L (ref 3.5–5.1)
Sodium: 139 mmol/L (ref 135–145)
Sodium: 138 mmol/L (ref 135–145)
Total Bilirubin: 0.5 mg/dL (ref 0.3–1.2)
Total Bilirubin: 0.4 mg/dL (ref 0.3–1.2)
Total Protein: 5.9 g/dL — ABNORMAL LOW (ref 6.5–8.1)
Total Protein: 6.4 g/dL — ABNORMAL LOW (ref 6.5–8.1)

## 2019-02-17 LAB — ECHOCARDIOGRAM LIMITED
Height: 69 in
Weight: 2990.4 oz

## 2019-02-17 LAB — C-REACTIVE PROTEIN: CRP: 1.1 mg/dL — ABNORMAL HIGH (ref <1.0)

## 2019-02-17 LAB — CK
Total CK: 17630 U/L — ABNORMAL HIGH (ref 49–397)
Total CK: 16916 U/L — ABNORMAL HIGH (ref 49–397)

## 2019-02-17 LAB — HIV ANTIBODY (ROUTINE TESTING W REFLEX): HIV Screen 4th Generation wRfx: NONREACTIVE

## 2019-02-17 LAB — TROPONIN I: Troponin I: 0.03 ng/mL (ref <0.03)

## 2019-02-17 LAB — T4, FREE: Free T4: 0.78 ng/dL — ABNORMAL LOW (ref 0.82–1.77)

## 2019-02-17 MED ORDER — IOHEXOL 300 MG/ML  SOLN
100.0000 mL | Freq: Once | INTRAMUSCULAR | Status: AC | PRN
  Administered 2019-02-17: 100 mL via INTRAVENOUS

## 2019-02-17 MED ORDER — SODIUM CHLORIDE 0.9 % IV SOLN: 1000.0000 mg | INTRAVENOUS | Status: DC

## 2019-02-17 NOTE — Plan of Care (Signed)

## 2019-02-17 NOTE — Progress Notes (Addendum)
Subjective: Patient OOB to chair with no complaints. Denies new muscle pain since his weakness started, stating "no more than the usual".  Objective: Current vital signs: BP 121/63 (BP Location: Left Arm)   Pulse (!) 52   Temp 98 F (36.7 C) (Oral)   Resp 20   Ht '5\' 9"'$  (1.753 m)   Wt 84.8 kg Comment: A scale  SpO2 98%   BMI 27.60 kg/m  Vital signs in last 24 hours: Temp:  [97.9 F (36.6 C)-99 F (37.2 C)] 98 F (36.7 C) (05/09 0848) Pulse Rate:  [47-64] 52 (05/09 0848) Resp:  [16-26] 20 (05/09 0848) BP: (101-141)/(47-74) 121/63 (05/09 0848) SpO2:  [96 %-100 %] 98 % (05/09 0848) Weight:  [81.2 kg-84.8 kg] 84.8 kg (05/09 0100)  Intake/Output from previous day: 05/08 0701 - 05/09 0700 In: 600 [P.O.:600] Out: 2200 [Urine:2200] Intake/Output this shift: Total I/O In: -  Out: 400 [Urine:400] Nutritional status:  Diet Order            Diet regular Room service appropriate? Yes; Fluid consistency: Thin  Diet effective now             HEENT: Crystal/AT Lungs: Respirations unlabored Ext: No edema. No rash to digits.  Skin: About 3 slightly raised, faded red macules about 1 cm diameter along skin of upper abdomen. One similar lesion along medial aspect of left forearm.   Neurologic Exam: Ment: Intact to complex questions and commands CN: PERRL. EOMI. Fixates and tracks normally. Face symmetric. Tongue midline. No evidence for facial weakness.  Motor:  BUE: 4/5 deltoid, biceps, triceps and grip without asymmetry.  BLE: 4-/5 hip flexion, 4/5 knee extension, 5/5 APF and ADF bilaterally with no asymmetry.  Tone normal. Sensory: Decreased temp sensation to BLE, BUE and face Reflexes:  2+ brachioradialis and biceps bilaterally.  2+ patellae and achilles bilaterally. Subtle positive Hoffman's sign bilaterally   Cerebellar: No ataxia with FNF bilaterally Gait: Difficulty standing from seated position, requiring patient to push off with his arms. Mild initial unsteadiness when  first standing. Able to stand on tiptoes and heels without difficulty.   Lab Results: Results for orders placed or performed during the hospital encounter of 02/16/19 (from the past 48 hour(s))  CBC with Differential     Status: None   Collection Time: 02/16/19  2:00 PM  Result Value Ref Range   WBC 4.9 4.0 - 10.5 K/uL   RBC 4.49 4.22 - 5.81 MIL/uL   Hemoglobin 13.7 13.0 - 17.0 g/dL   HCT 41.2 39.0 - 52.0 %   MCV 91.8 80.0 - 100.0 fL   MCH 30.5 26.0 - 34.0 pg   MCHC 33.3 30.0 - 36.0 g/dL   RDW 12.4 11.5 - 15.5 %   Platelets 213 150 - 400 K/uL   nRBC 0.0 0.0 - 0.2 %   Neutrophils Relative % 52 %   Neutro Abs 2.6 1.7 - 7.7 K/uL   Lymphocytes Relative 35 %   Lymphs Abs 1.7 0.7 - 4.0 K/uL   Monocytes Relative 10 %   Monocytes Absolute 0.5 0.1 - 1.0 K/uL   Eosinophils Relative 2 %   Eosinophils Absolute 0.1 0.0 - 0.5 K/uL   Basophils Relative 1 %   Basophils Absolute 0.0 0.0 - 0.1 K/uL   Immature Granulocytes 0 %   Abs Immature Granulocytes 0.01 0.00 - 0.07 K/uL    Comment: Performed at [facility], [address] [place], [place] [address]  Basic metabolic panel  Status: None   Collection Time: 02/16/19  2:00 PM  Result Value Ref Range   Sodium 139 135 - 145 mmol/L   Potassium 3.7 3.5 - 5.1 mmol/L   Chloride 101 98 - 111 mmol/L   CO2 27 22 - 32 mmol/L   Glucose, Bld 90 70 - 99 mg/dL   BUN 11 8 - 23 mg/dL   Creatinine, Ser 0.68 0.61 - 1.24 mg/dL   Calcium 9.2 8.9 - 10.3 mg/dL   GFR calc non Af Amer >60 >60 mL/min   GFR calc Af Amer >60 >60 mL/min   Anion gap 11 5 - 15    Comment: Performed at [facility] [address] [place], [place] [address]  CK     Status: Abnormal   Collection Time: 02/16/19  2:00 PM  Result Value Ref Range   Total CK 16,516 (H) 49 - 397 U/L    Comment: RESULTS CONFIRMED BY MANUAL DILUTION Performed at [facility], [place] [address], [place], [place] [address]   Hepatic function panel     Status: Abnormal   Collection  Time: 02/16/19  3:24 PM  Result Value Ref Range   Total Protein 6.6 6.5 - 8.1 g/dL   Albumin 3.4 (L) 3.5 - 5.0 g/dL   AST 469 (H) 15 - 41 U/L   ALT 504 (H) 0 - 44 U/L   Alkaline Phosphatase 59 38 - 126 U/L   Total Bilirubin 0.4 0.3 - 1.2 mg/dL   Bilirubin, Direct 0.1 0.0 - 0.2 mg/dL   Indirect Bilirubin 0.3 0.3 - 0.9 mg/dL    Comment: Performed at [facility] [address] [place], [place] [address]  SARS Coronavirus 2 (CEPHEID - Performed in [facility] hospital lab), Hosp Order     Status: None   Collection Time: 02/16/19  4:21 PM  Result Value Ref Range   SARS Coronavirus 2 NEGATIVE NEGATIVE    Comment: (NOTE) If result is NEGATIVE SARS-CoV-2 target nucleic acids are NOT DETECTED. The SARS-CoV-2 RNA is generally detectable in upper and lower  respiratory specimens during the acute phase of infection. The lowest  concentration of SARS-CoV-2 viral copies this assay can detect is 250  copies / mL. A negative result does not preclude SARS-CoV-2 infection  and should not be used as the sole basis for treatment or other  patient management decisions.  A negative result may occur with  improper specimen collection / handling, submission of specimen other  than nasopharyngeal swab, presence of viral mutation(s) within the  areas targeted by this assay, and inadequate number of viral copies  (<250 copies / mL). A negative result must be combined with clinical  observations, patient history, and epidemiological information. If result is POSITIVE SARS-CoV-2 target nucleic acids are DETECTED. The SARS-CoV-2 RNA is generally detectable in upper and lower  respiratory specimens dur ing the acute phase of infection.  Positive  results are indicative of active infection with SARS-CoV-2.  Clinical  correlation with patient history and other diagnostic information is  necessary to determine patient infection status.  Positive results do  not rule out bacterial infection or  co-infection with other viruses. If result is PRESUMPTIVE POSTIVE SARS-CoV-2 nucleic acids MAY BE PRESENT.   A presumptive positive result was obtained on the submitted specimen  and confirmed on repeat testing.  While 2019 novel coronavirus  (SARS-CoV-2) nucleic acids may be present in the submitted sample  additional confirmatory testing may be necessary for epidemiological  and / or clinical management purposes  to differentiate between  SARS-CoV-2 and other Sarbecovirus currently known to infect humans.  If clinically indicated additional testing with an alternate test  methodology [phone]) is advised. The SARS-CoV-2 RNA is generally  detectable in upper and lower respiratory sp ecimens during the acute  phase of infection. The expected result is Negative. Fact Sheet for Patients:  StrictlyIdeas.no Fact Sheet for Healthcare Providers: BankingDealers.co.za This test is not yet approved or cleared by the [place] FDA and has been authorized for detection and/or diagnosis of SARS-CoV-2 by FDA under an Emergency Use Authorization (EUA).  This EUA will remain in effect (meaning this test can be used) for the duration of the COVID-19 declaration under Section 564(b)(1) of the Act, 21 U.S.C. section 360bbb-3(b)(1), unless the authorization is terminated or revoked sooner. Performed at [facility], [place] [address], [place], [place] [address]   Urinalysis, Routine w reflex microscopic     Status: Abnormal   Collection Time: 02/16/19  4:27 PM  Result Value Ref Range   Color, Urine AMBER (A) YELLOW    Comment: BIOCHEMICALS MAY BE AFFECTED BY COLOR   APPearance CLOUDY (A) CLEAR   Specific Gravity, Urine 1.023 1.005 - 1.030   pH 5.0 5.0 - 8.0   Glucose, UA NEGATIVE NEGATIVE mg/dL   Hgb urine dipstick LARGE (A) NEGATIVE   Bilirubin Urine NEGATIVE NEGATIVE   Ketones, ur NEGATIVE NEGATIVE mg/dL   Protein, ur 100 (A) NEGATIVE mg/dL    Nitrite NEGATIVE NEGATIVE   Leukocytes,Ua NEGATIVE NEGATIVE   RBC / HPF 0-5 0 - 5 RBC/hpf   WBC, UA 0-5 0 - 5 WBC/hpf   Bacteria, UA RARE (A) NONE SEEN   Mucus PRESENT    Hyaline Casts, UA PRESENT    Granular Casts, UA PRESENT    Amorphous [place] PRESENT     Comment: Performed at [facility], [address] [place], [place] [address]  HIV antibody (Routine Testing)     Status: None   Collection Time: 02/16/19  6:12 PM  Result Value Ref Range   HIV Screen 4th Generation wRfx Non Reactive Non Reactive    Comment: (NOTE) Performed At: [facility] [place], [place] [id number] Rush Farmer MD IR:[id number]   Comprehensive metabolic panel     Status: Abnormal   Collection Time: 02/16/19 11:21 PM  Result Value Ref Range   Sodium 139 135 - 145 mmol/L   Potassium 3.8 3.5 - 5.1 mmol/L   Chloride 107 98 - 111 mmol/L   CO2 27 22 - 32 mmol/L   Glucose, Bld 94 70 - 99 mg/dL   BUN 10 8 - 23 mg/dL   Creatinine, Ser 0.62 0.61 - 1.24 mg/dL   Calcium 8.6 (L) 8.9 - 10.3 mg/dL   Total Protein 5.9 (L) 6.5 - 8.1 g/dL   Albumin 3.1 (L) 3.5 - 5.0 g/dL   AST 475 (H) 15 - 41 U/L   ALT 465 (H) 0 - 44 U/L   Alkaline Phosphatase 52 38 - 126 U/L   Total Bilirubin 0.5 0.3 - 1.2 mg/dL   GFR calc non Af Amer >60 >60 mL/min   GFR calc Af Amer >60 >60 mL/min   Anion gap 5 5 - 15    Comment: Performed at [facility], [address] [place], [place] [address]  CK     Status: Abnormal   Collection Time: 02/16/19 11:21 PM  Result Value Ref Range  Total CK 17,630 (H) 49 - 397 U/L    Comment: RESULTS CONFIRMED BY MANUAL DILUTION Performed at [facility], [place] [address] [place] [address]   CBC     Status: None   Collection Time: 02/17/19  2:12 AM  Result Value Ref Range   WBC 4.6 4.0 - 10.5 K/uL   RBC 4.25 4.22 - 5.81 MIL/uL   Hemoglobin 13.0 13.0 - 17.0 g/dL   HCT 39.0 39.0 - 52.0 %   MCV 91.8 80.0 - 100.0 fL   MCH 30.6 26.0 - 34.0 pg    MCHC 33.3 30.0 - 36.0 g/dL   RDW 12.4 11.5 - 15.5 %   Platelets 196 150 - 400 K/uL   nRBC 0.0 0.0 - 0.2 %    Comment: Performed at [facility], [place] [address], [place], [place] [address]  Troponin I - Once     Status: Abnormal   Collection Time: 02/17/19  6:55 AM  Result Value Ref Range   Troponin I 0.03 (HH) <0.03 ng/mL    Comment: CRITICAL RESULT CALLED TO, READ BACK BY AND VERIFIED WITH: H.LEE,RN @ 0805 02/17/2019 [place] Performed at [facility], [place] [address] [place], [place] [address]     Recent Results (from the past 240 hour(s))  SARS Coronavirus 2 (CEPHEID - Performed in [facility] hospital lab), Hosp Order     Status: None   Collection Time: 02/16/19  4:21 PM  Result Value Ref Range Status   SARS Coronavirus 2 NEGATIVE NEGATIVE Final    Comment: (NOTE) If result is NEGATIVE SARS-CoV-2 target nucleic acids are NOT DETECTED. The SARS-CoV-2 RNA is generally detectable in upper and lower  respiratory specimens during the acute phase of infection. The lowest  concentration of SARS-CoV-2 viral copies this assay can detect is 250  copies / mL. A negative result does not preclude SARS-CoV-2 infection  and should not be used as the sole basis for treatment or other  patient management decisions.  A negative result may occur with  improper specimen collection / handling, submission of specimen other  than nasopharyngeal swab, presence of viral mutation(s) within the  areas targeted by this assay, and inadequate number of viral copies  (<250 copies / mL). A negative result must be combined with clinical  observations, patient history, and epidemiological information. If result is POSITIVE SARS-CoV-2 target nucleic acids are DETECTED. The SARS-CoV-2 RNA is generally detectable in upper and lower  respiratory specimens dur ing the acute phase of infection.  Positive  results are indicative of active infection with SARS-CoV-2.  Clinical  correlation with patient  history and other diagnostic information is  necessary to determine patient infection status.  Positive results do  not rule out bacterial infection or co-infection with other viruses. If result is PRESUMPTIVE POSTIVE SARS-CoV-2 nucleic acids MAY BE PRESENT.   A presumptive positive result was obtained on the submitted specimen  and confirmed on repeat testing.  While 2019 novel coronavirus  (SARS-CoV-2) nucleic acids may be present in the submitted sample  additional confirmatory testing may be necessary for epidemiological  and / or clinical management purposes  to differentiate between  SARS-CoV-2 and other Sarbecovirus currently known to infect humans.  If clinically indicated additional testing with an alternate test  methodology [phone]) is advised. The SARS-CoV-2 RNA is generally  detectable in upper and lower respiratory sp ecimens during the acute  phase of infection. The expected result is Negative. Fact Sheet for  Patients:  StrictlyIdeas.no Fact Sheet for Healthcare Providers: BankingDealers.co.za This test is not yet approved or cleared by the [place] FDA and has been authorized for detection and/or diagnosis of SARS-CoV-2 by FDA under an Emergency Use Authorization (EUA).  This EUA will remain in effect (meaning this test can be used) for the duration of the COVID-19 declaration under Section 564(b)(1) of the Act, 21 U.S.C. section 360bbb-3(b)(1), unless the authorization is terminated or revoked sooner. Performed at [facility], [place] [address] [place],  [address]     Lipid Panel Recent Labs    02/15/19 1203  CHOL 164  TRIG 144.0  HDL 33.30*  CHOLHDL 5  VLDL 28.8  LDLCALC 102*    Studies/Results: Dg Lumbar Spine Complete  Result Date: 02/15/2019 CLINICAL DATA:  Lower extremity weakness. EXAM: LUMBAR SPINE - COMPLETE 4+ VIEW COMPARISON:  CT and pelvis dated 03/03/2018 FINDINGS: There is no  displaced fracture. Degenerative changes are noted of the lumbar spine with moderate to severe disc height loss at the L5-S1 level. There is facet arthrosis of the lower lumbar segments. Atherosclerotic changes are noted of the abdominal aorta. A phlebolith projects over the patient's left hemipelvis. A moderate amount of stool is noted in the colon. IMPRESSION: 1. No acute osseous abnormality. 2. Multilevel degenerative changes, greatest at the L5-S1 level. 3. Atherosclerotic changes of the abdominal aorta. Electronically Signed   By: Constance Holster M.D.   On: 02/15/2019 17:38   Dg Chest Port 1v Same Day  Result Date: 02/17/2019 CLINICAL DATA:  Dyspnea. EXAM: PORTABLE CHEST 1 VIEW COMPARISON:  CT scan of October 31, 2015. FINDINGS: The heart size and mediastinal contours are within normal limits. Both lungs are clear. No pneumothorax or pleural effusion is noted. The visualized skeletal structures are unremarkable. IMPRESSION: No active disease. Electronically Signed   By: Marijo Conception M.D.   On: 02/17/2019 09:06    Medications:  Prior to Admission:  Medications Prior to Admission  Medication Sig Dispense Refill Last Dose  . acetaminophen (TYLENOL) 500 MG tablet Take 1,000 mg by mouth every 6 (six) hours as needed for headache (pain).   week ago  . Alirocumab (PRALUENT) 150 MG/ML SOPN Inject 150 mg as directed every 14 (fourteen) days.    1 1/2 weeks ago  . amoxicillin (AMOXIL) 500 MG capsule Take 2,000 mg by mouth See admin instructions. Take 4 capsules (2000 mg) by mouth one hours prior to dental appointments   Dec 2019?  Marland Kitchen aspirin 81 MG chewable tablet Chew 1 tablet (81 mg total) by mouth daily. (Patient taking differently: Chew 81 mg by mouth at bedtime. ) 30 tablet 0 02/15/2019 at pm  . finasteride (PROSCAR) 5 MG tablet Take 5 mg by mouth at bedtime.    02/15/2019 at pm  . fluticasone (FLONASE) 50 MCG/ACT nasal spray Place 2 sprays into both nostrils daily. (Patient taking differently: Place  2 sprays into both nostrils daily as needed for allergies or rhinitis. ) 16 g 3 week ago  . nitroGLYCERIN (NITROSTAT) 0.4 MG SL tablet Place 1 tablet (0.4 mg total) under the tongue every 5 (five) minutes as needed for chest pain. 25 tablet 4 never taken  . Omega-3 Fatty Acids (FISH OIL PO) Take 2 capsules by mouth at bedtime.    02/15/2019 at pm  . Polyvinyl Alcohol-Povidone (REFRESH OP) Place 1 drop into both eyes daily.   02/16/2019 at am  . tamsulosin (FLOMAX) 0.4 MG CAPS capsule TAKE 1 CAPSULE BY  MOUTH ONCE DAILY WITH SUPPER (Patient taking differently: Take 0.4 mg by mouth at bedtime. ) 90 capsule 3 02/15/2019 at pm   Scheduled: . aspirin  81 mg Oral QHS  . docusate sodium  100 mg Oral BID  . enoxaparin (LOVENOX) injection  40 mg Subcutaneous Q24H  . finasteride  5 mg Oral QHS  . tamsulosin  0.4 mg Oral Q supper   Continuous: . sodium chloride 150 mL/hr at 02/17/19 2637    Assessment:  68 y.o. 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 1. At baseline, he does have some right > left sided upper and lower extremity weakness from his prior C-spine injury. 2. On my examination as well as Dr. Johny Chess exam yesterday, there more proximal weakness than distal weakness. The exam is symmetric when it comes to right left comparison. There is particular concern about the proximal hip weakness with nearly normal knee and ankle strength bilaterally. 3. This could all be from the rhabdomyolysis but given history of unintentional weight loss, this could also represent Lambert-Eaton myasthenic syndrome.  Inflammatory myopathies are to be kept in the DDx as well. 4. His exam is confounded somewhat from pre-existing UMN deficit secondary to prior spinal injury. The hyperreflexia  is most likely residual. Dr. Rory Percy and I are in consensus that Guillain-Barr syndrome is unlikely. 5. No rash consistent with dermatomyositis is seen. A few raised macules are seen, which the patient states are fading. The macules started at the same time as the onset of his progressive weakness (compared to his chronically weak baseline from the spinal cord injury), which continues to raise the question of an autoimmune phenomenon.  6. CK increased from 14 K on 5/7 to 16 K and then 17.6 K late last night. Redrawing level qd.  7. ESR was normal at 18. Does not rule out a polymyositis as ESR is normal in 50% of cases. To evaluate for polymyositis, muscle biopsy, EMG (can only be done aas outpatient her in [place]) and MRI of thigh will be useful. C-reactive protein is also being ordered 8. TSH was mildly elevated at 4.86. Not felt to be high enough to be a hypothyroid myopathy. T4 level ordered. May need endocrinology consult.  9. Also on the DDx is a tick borne infection resulting in rhabdomyolysis. This is a consideration as the individual macules of the "rash" resemble the size and morphology of a typical tick bite - the residual inflammation from such could persist for several weeks, especially if associated with local infection. Ehrlichiosis, RMSF and Lyme disease, all of which are transmitted by ticks, can be associated with a myositis/rhabdomyolysis.   Recommendations: -- Discussed with Dr. Einar Gip. Will need muscle biopsy on Monday. The biopsy should be performed on his quadriceps, which is the most accessible of the newly weakened muscle groups.  -- Dr. Einar Gip and I are in agreement that we should hold off on empiric IV methylprednisolone until tomorrow to reduce the chance of confounding the pathology assessment of the muscle biopsy to be performed on Monday. -- Ordering c-reactive protein and ESR -- Anti VGCC antibody (for possible LEMS) is pending -- Anti-Jo antibody pending -- Anti  scleroderma antibody pending -- ANA, IFA pending -- MRI cervical, thoracic and lumbar spine pending.  -- Serum aldolase  and myoglobin levels pending -- CK level to be tracked qd  -- T4 level has been ordered. May need endocrinology consult for elevated TSH.  -- PT/OT --Aggressive hydration with IV fluids for the rhabdomyolysis -- Holding Praluent for now. Although it is being held, this medication is unlikely to have precipitated his myopathy as, per patient, he has been on this medication for 6-12 months and onset of myopathic symptoms is recent.  -- Outpatient EMG nerve conduction studies-Dr. Narda Amber at [facility] -- Due to the concern for weight loss, a pan scan to rule out malignancy (specifically small cell lung cancer that has most common association with Lambert-Eaton.) is recommended. Will defer to primary team -- Paraneoplastic/polymyositis panel has been ordered as a send out to [facility]. In order I have specified that a focused panel for myopathy/polymyositis should be run. -- If MRI of spine is unrevealing, obtain MRI of thigh to assess for specific pattern of inflammation of muscle groups, which can aid in narrowing the DDx for some myopathies.  -- Ehrlichia antibody, RMSF titer, lyme titer  40 minutes spent in the evaluation and management of this complex case. < 50% of time was spent in combined patient education and coordination of care   LOS: 1 day   '@Electronically'$  signed: Dr. Kerney Elbe 02/17/2019  9:42 AM

## 2019-02-17 NOTE — Evaluation (Signed)
Occupational Therapy Evaluation Patient Details Name: Stanley Boyd MRN: [id number] DOB: [birth year] Today's Date: 02/17/2019    History of Present Illness Stanley Boyd is a 68 y.o. male with medical history significant of CAD; and HLD presenting with weakness. 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   Clinical Impression   PTA, pt was living with his wife and was independent and enjoys hunting and being outside. Currently, pt performing near baseline level with supervision for ADLs and functional mobility. Reports he feels at ~60% back to baseline and reports feeling generally weak (with increased weakness at BLEs). Pt reports he feels comfortable with performance of ADLs. Answered all questions. Recommend dc home once medically stable per physician. All acute OT needs met and will sign off. Thank you.     Follow Up Recommendations  No OT follow up;Supervision - Intermittent    Equipment Recommendations  None recommended by OT    Recommendations for Other Services PT consult     Precautions / Restrictions Precautions Precautions: Fall Precaution Comments: R sided weakness Restrictions Weight Bearing Restrictions: No      Mobility Bed Mobility Overal bed mobility: Independent             General bed mobility comments: HOB flat, mildly impulsive/ quick to move  Transfers Overall transfer level: Needs assistance Equipment used: None Transfers: Sit to/from Stand Sit to Stand: Supervision         General transfer comment: no difficulty, mildly quick to move    Balance Overall balance assessment: Needs assistance Sitting-balance support: Feet supported;No upper extremity supported Sitting balance-Leahy Scale: Good Sitting balance - Comments: balance is good however doesn't don/doff socks due  to inability of getting down to his feet   Standing balance support: No upper extremity supported Standing balance-Leahy Scale: Good                   Standardized Balance Assessment Standardized Balance Assessment : Dynamic Gait Index   Dynamic Gait Index Level Surface: Mild Impairment Change in Gait Speed: Mild Impairment Gait with Horizontal Head Turns: Mild Impairment Gait with Vertical Head Turns: Mild Impairment Gait and Pivot Turn: Mild Impairment Step Over Obstacle: Mild Impairment Step Around Obstacles: Mild Impairment Steps: Mild Impairment Total Score: 16     ADL either performed or assessed with clinical judgement   ADL Overall ADL's : Needs assistance/impaired                                       General ADL Comments: Pt performing ADLs and functional mobility at supervision level with exception of donning socks which he required assistnace for at baseline.      Vision         Perception     Praxis      Pertinent Vitals/Pain Pain Assessment: No/denies pain     Hand Dominance Right   Extremity/Trunk Assessment Upper Extremity Assessment Upper Extremity Assessment: RUE deficits/detail RUE Deficits / Details: Prior cervical injury impacting his R-side. Decreased strength and coorindation. Tendency to hold in flexed position. Pt able to perform ROM at shoulder, elbow, wrist, and hand. Able to perform finger opposition slowly RUE Coordination: decreased fine motor;decreased gross motor   Lower Extremity Assessment  Lower Extremity Assessment: Defer to PT evaluation RLE Deficits / Details: extremely tight hamstrings, unable to achieve full knee extension ROM, grossly 4/5 withint available range except hip flexor 3-/5 LLE Deficits / Details: extremely tight hamstrings, unable to achieve full knee extension ROM, grossly 4/5 withint available range except hip flexor 3-/5   Cervical / Trunk Assessment Cervical / Trunk Assessment:  Normal   Communication Communication Communication: No difficulties   Cognition Arousal/Alertness: Awake/alert Behavior During Therapy: Flat affect Overall Cognitive Status: Within Functional Limits for tasks assessed                                     General Comments  VSS    Exercises     Shoulder Instructions      Home Living Family/patient expects to be discharged to:: Private residence Living Arrangements: Spouse/significant other Available Help at Discharge: Family;Available 24 hours/day Type of Home: House Home Access: Stairs to enter [employer] of Steps: 5 Entrance Stairs-Rails: Can reach both Home Layout: Two level;Able to live on main level with bedroom/bathroom     Bathroom Shower/Tub: Occupational psychologist: Handicapped height     Home Equipment: [place];Shower seat          Prior Functioning/Environment Level of Independence: Independent        Comments: pt loves to hunt and be in the woods, pt was push lawn mowing his 3 acre yard, pt amb without AD. pt unable to don socks, wife does it. pt reports over the last 2 weeks he is now unable to bring knees up as high as before, ie. unable to bring LE into car without using his hands        OT Problem List: Decreased activity tolerance;Impaired balance (sitting and/or standing)      OT Treatment/Interventions:      OT Goals(Current goals can be found in the care plan section) Acute Rehab OT Goals Patient Stated Goal: get stronger OT Goal Formulation: All assessment and education complete, DC therapy  OT Frequency:     Barriers to D/C:            Co-evaluation PT/OT/SLP Co-Evaluation/Treatment: Yes Reason for Co-Treatment: Complexity of the patient's impairments (multi-system involvement);For patient/therapist safety;To address functional/ADL transfers PT goals addressed during session: Mobility/safety with mobility OT goals addressed during  session: ADL's and self-care      AM-PAC OT "6 Clicks" Daily Activity     Outcome Measure Help from another person eating meals?: None Help from another person taking care of personal grooming?: None Help from another person toileting, which includes using toliet, bedpan, or urinal?: None Help from another person bathing (including washing, rinsing, drying)?: None Help from another person to put on and taking off regular upper body clothing?: None Help from another person to put on and taking off regular lower body clothing?: None 6 Click Score: 24   End of Session Equipment Utilized During Treatment: Gait belt Nurse Communication: Mobility status  Activity Tolerance: Patient tolerated treatment well Patient left: in chair;with call bell/phone within reach  OT Visit Diagnosis: Unsteadiness on feet (R26.81);Other abnormalities of gait and mobility (R26.89);Muscle weakness (generalized) (M62.81)                Time: 8032-1224 OT Time Calculation (min): 17 min Charges:  OT General Charges $OT Visit: 1 Visit OT Evaluation $OT Eval Low Complexity: 1 Low  Miyako Oelke MSOT, OTR/L Acute Rehab Pager: [phone] Office: [place] 02/17/2019, 10:36 AM

## 2019-02-17 NOTE — Progress Notes (Signed)
  Echocardiogram 2D Echocardiogram has been performed.  Stanley Boyd 02/17/2019, 1:39 PM

## 2019-02-17 NOTE — Evaluation (Signed)
Physical Therapy Evaluation Patient Details Name: Stanley Boyd MRN: [id number] DOB: 1951/06/06 Today's Date: 02/17/2019   History of Present Illness  Stanley Boyd is a 68 y.o. male with medical history significant of CAD; and HLD presenting with weakness. 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  Clinical Impression  Pt admitted with above. Pt functioning at a mod I level however reports he is at 60% of his normal level of strength. Pt reports he isn't able to lift his legs up as high as normal ie. Unable to bring LEs in the car without using his UE to assist lifting the LEs. Pt to benefit from Out Pt PT to help improved strength however due to COVID would start with HHPT to provide HEP with wife and patient. Acute PT to cont to follow.    Follow Up Recommendations Home health PT;Supervision - Intermittent    Equipment Recommendations  None recommended by PT    Recommendations for Other Services       Precautions / Restrictions Precautions Precautions: Fall Precaution Comments: R sided weakness Restrictions Weight Bearing Restrictions: No      Mobility  Bed Mobility Overal bed mobility: Independent             General bed mobility comments: HOB flat, mildly impulsive/ quick to move  Transfers Overall transfer level: Needs assistance Equipment used: None Transfers: Sit to/from Stand Sit to Stand: Independent         General transfer comment: no difficulty, mildly quick to move  Ambulation/Gait Ambulation/Gait assistance: Modified independent (Device/Increase time) Gait Distance (Feet): 200 Feet Assistive device: None Gait Pattern/deviations: Step-through pattern;Decreased stride length Gait velocity: wfl   General Gait Details: pt with R antalgic limp due to weakness from previous  injury  Stairs Stairs: Yes Stairs assistance: Min guard Stair Management: No rails;Alternating pattern Number of Stairs: 4(limited by IV line) General stair comments: pt with good technique, educated on sequencing "up with the strong, down with the weak" however pt reciprocally completed ascending/descending  Wheelchair Mobility    Modified Rankin (Stroke Patients Only)       Balance Overall balance assessment: Needs assistance Sitting-balance support: Feet supported;No upper extremity supported Sitting balance-Leahy Scale: Good Sitting balance - Comments: balance is good however doesn't don/doff socks due to inability of getting down to his feet   Standing balance support: No upper extremity supported Standing balance-Leahy Scale: Good                   Standardized Balance Assessment Standardized Balance Assessment : Dynamic Gait Index   Dynamic Gait Index Level Surface: Mild Impairment Change in Gait Speed: Mild Impairment Gait with Horizontal Head Turns: Mild Impairment Gait with Vertical Head Turns: Mild Impairment Gait and Pivot Turn: Mild Impairment Step Over Obstacle: Mild Impairment Step Around Obstacles: Mild Impairment Steps: Mild Impairment Total Score: 16       Pertinent Vitals/Pain Pain Assessment: No/denies pain    Home Living Family/patient expects to be discharged to:: Private residence Living Arrangements: Spouse/significant other Available Help at Discharge: Family;Available 24 hours/day Type of Home: House Home Access: Stairs to enter Entrance Stairs-Rails: Can reach both Entrance Stairs-Number of Steps: 5 Home Layout: Two level;Able to live on main level with bedroom/bathroom Home Equipment: Grab bars - tub/shower;Shower seat      Prior  Function Level of Independence: Independent         Comments: pt loves to hunt and be in the woods, pt was push lawn mowing his 3 acre yard, pt amb without AD. pt unable to don socks, wife does  it. pt reports over the last 2 weeks he is now unable to bring knees up as high as before, ie. unable to bring LE into car without using his hands     Hand Dominance   Dominant Hand: Right    Extremity/Trunk Assessment   Upper Extremity Assessment Upper Extremity Assessment: Defer to OT evaluation(generalized weakness from previous injury)    Lower Extremity Assessment Lower Extremity Assessment: RLE deficits/detail;LLE deficits/detail RLE Deficits / Details: extremely tight hamstrings, unable to achieve full knee extension ROM, grossly 4/5 withint available range except hip flexor 3-/5 LLE Deficits / Details: extremely tight hamstrings, unable to achieve full knee extension ROM, grossly 4/5 withint available range except hip flexor 3-/5    Cervical / Trunk Assessment Cervical / Trunk Assessment: Normal  Communication   Communication: No difficulties  Cognition Arousal/Alertness: Awake/alert Behavior During Therapy: Flat affect Overall Cognitive Status: Within Functional Limits for tasks assessed                                        General Comments General comments (skin integrity, edema, etc.): VSS    Exercises     Assessment/Plan    PT Assessment Patient needs continued PT services  PT Problem List Decreased strength;Decreased activity tolerance;Decreased range of motion;Decreased balance;Decreased mobility;Decreased coordination       PT Treatment Interventions DME instruction;Gait training;Stair training;Functional mobility training;Therapeutic activities;Therapeutic exercise;Balance training;Neuromuscular re-education    PT Goals (Current goals can be found in the Care Plan section)  Acute Rehab PT Goals Patient Stated Goal: get stronger PT Goal Formulation: With patient Time For Goal Achievement: 03/03/19 Potential to Achieve Goals: Good Additional Goals Additional Goal #1: Pt to score >42 on DGI to indicate minimal falls risk.     Frequency Min 3X/week   Barriers to discharge        Co-evaluation PT/OT/SLP Co-Evaluation/Treatment: Yes Reason for Co-Treatment: Complexity of the patient's impairments (multi-system involvement) PT goals addressed during session: Mobility/safety with mobility         AM-PAC PT "6 Clicks" Mobility  Outcome Measure Help needed turning from your back to your side while in a flat bed without using bedrails?: None Help needed moving from lying on your back to sitting on the side of a flat bed without using bedrails?: None Help needed moving to and from a bed to a chair (including a wheelchair)?: None Help needed standing up from a chair using your arms (e.g., wheelchair or bedside chair)?: None Help needed to walk in hospital room?: None Help needed climbing 3-5 steps with a railing? : A Little 6 Click Score: 23    End of Session Equipment Utilized During Treatment: Gait belt Activity Tolerance: Patient tolerated treatment well Patient left: in chair;with call bell/phone within reach Nurse Communication: Mobility status PT Visit Diagnosis: Unsteadiness on feet (R26.81);Difficulty in walking, not elsewhere classified (R26.2);Muscle weakness (generalized) (M62.81)    Time: 1017-5102 PT Time Calculation (min) (ACUTE ONLY): 22 min   Charges:   PT Evaluation $PT Eval Low Complexity: 1 Low          Kittie Plater, PT, DPT Acute Rehabilitation Services Pager #: [phone] Office #:  [place] 02/17/2019, 10:20 AM

## 2019-02-17 NOTE — Progress Notes (Addendum)
[facility]                                                                              Patient Demographics  Stanley Boyd, is a 68 y.o. male, DOB - May 29, 1951, XLK:[id number]  Admit date - 02/16/2019   Admitting Physician Karmen Bongo, MD  Outpatient Primary MD for the patient is [name], MD  Outpatient specialists:   LOS - 1  days   Medical records reviewed and are as summarized below:    Chief Complaint  Patient presents with  . Dehydration       Brief summary   Patient is a 68 year old male with CAD, hyperlipidemia presented with increasing weakness.  Patient had not been able to lift his legs, feeling increasingly weak, hard to ambulate.  Patient saw his PCP, CK level was elevated and was recommended hospitalization and further work-up.  Patient reported symptoms started about a month ago and worse in the last 2 weeks starts from the calves down.  Patient ports unable to sit up without assistance and has progressed.  No worsening upper extremity weakness.  Denied any numbness, tingling, respiratory compromise.  He has increasing falls over the past 4 to 5 weeks, on injectable alirocumab for last 1 year.  He also reported some weight loss over the past few weeks to months, unintentional but attributed to decreased appetite and not hydrating himself well.  He has rash on his chest and back for the past 4 to 6 weeks CK elevated 14,000 at PCPs office, CK 16,516 in ED. initially concern for (syndrome, patient was admitted for further work-up. 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  Aldolase, ESR, ANA, anti-Jo  antibody, anti-scleroderma antibodies pending - will rule out tickborne cause muscle weakness with appearance of the rash on the chest (?  Tick bite), ordered RMSF, Lyme and Ehrlichia antibody panel.  Paraneoplastic panel sent. - Praluent currently on hold, per neurology unlikely to have caused myopathy as patient has been on it for almost a year.   - MRI of the C-spine, T-spine and L-spine pending has a history of prior cord injury - Follow CT chest abdomen pelvis to rule out malignancy given weight loss, loss of appetite - Surgery consulted for muscle biopsy, patient will need outpatient EMG study - TSH only mildly elevated, follow free T4.  Not on any medications that would significantly cause rhabdomyolysis or transaminitis. - Hold steroids until biopsy is done or if CK trending significantly up Addendum Discussed with general surgery, will do muscle biopsy on Monday a.m. as muscle biopsy for specimen will be read by pathologist on Monday   Active Problems:   Hyperlipidemia -Praluent currently on hold  Acute transaminitis -Likely due to acute rhabdomyolysis,  will check acute hepatitis panel    BPH (benign prostatic hyperplasia) -Continue Flomax   History of CAD with PCI in  06/2015 -Cardiology following, currently no acute chest pain or shortness of breath   Code Status: Full code DVT Prophylaxis:  Lovenox  Family Communication: Discussed in detail with the patient, all imaging results, lab results explained to the patient and son, Dr Lacinda Axon    Disposition Plan: Inpatient work-up in progress  Time Spent in minutes 35 minutes  Procedures:  None  Consultants:   Neurology Cardiology  Antimicrobials:   Anti-infectives (From admission, onward)   None          Medications  Scheduled Meds: . aspirin  81 mg Oral QHS  . docusate sodium  100 mg Oral BID  . enoxaparin (LOVENOX) injection  40 mg Subcutaneous Q24H  . finasteride  5 mg Oral QHS  . tamsulosin  0.4 mg Oral  Q supper   Continuous Infusions: . sodium chloride 150 mL/hr at 02/17/19 0822   PRN Meds:.morphine injection, ondansetron **OR** ondansetron (ZOFRAN) IV      Subjective:   Stanley Boyd was seen and examined today.  No significant improvement in the weakness, working with PT.  No fevers or chills.  Rash on the chest noted.  Patient denies dizziness, chest pain, shortness of breath, abdominal pain, N/V/D/C.  No numbness or tingling.   Objective:   Vitals:   02/17/19 0100 02/17/19 0400 02/17/19 0500 02/17/19 0848  BP:  (!) 101/47 130/66 121/63  Pulse:  (!) 57 62 (!) 52  Resp:  '16 16 20  '$ Temp:  97.9 F (36.6 C)  98 F (36.7 C)  TempSrc:  Oral  Oral  SpO2:  96%  98%  Weight: 84.8 kg     Height:        Intake/Output Summary (Last 24 hours) at 02/17/2019 1043 Last data filed at 02/17/2019 1023 Gross per 24 hour  Intake 600 ml  Output 3200 ml  Net -2600 ml     Wt Readings from Last 3 Encounters:  02/17/19 84.8 kg  02/15/19 81.5 kg  02/06/18 85.5 kg     Exam  General: Alert and oriented x 3, NAD  Eyes:   HEENT:  Atraumatic, normocephalic, normal oropharynx  Cardiovascular: S1 S2 auscultated, no rubs, murmurs or gallops. Regular rate and rhythm.  Respiratory: Clear to auscultation bilaterally, no wheezing, rales or rhonchi  Gastrointestinal: Soft, nontender, nondistended, + bowel sounds  Ext: no pedal edema bilaterally  Neuro: 4/5 upper extremities, 4/5 lower extremities, unsteadiness noted during ambulation  Musculoskeletal: No digital cyanosis, clubbing  Skin: No rashes  Psych: Normal affect and demeanor, alert and oriented x3    Data Reviewed:  I have personally reviewed following labs and imaging studies  Micro Results Recent Results (from the past 240 hour(s))  SARS Coronavirus 2 (CEPHEID - Performed in [place] hospital lab), Hosp Order     Status: None   Collection Time: 02/16/19  4:21 PM  Result Value Ref Range Status   SARS Coronavirus 2  NEGATIVE NEGATIVE Final    Comment: (NOTE) If result is NEGATIVE SARS-CoV-2 target nucleic acids are NOT DETECTED. The SARS-CoV-2 RNA is generally detectable in upper and lower  respiratory specimens during the acute phase of infection. The lowest  concentration of SARS-CoV-2 viral copies this assay can detect is 250  copies / mL. A negative result does not preclude SARS-CoV-2 infection  and should not be used as the sole basis for treatment or other  patient management decisions.  A negative result may occur with  improper specimen collection / handling, submission of specimen other  than nasopharyngeal swab, presence of viral mutation(s) within the  areas targeted by this assay, and inadequate number of viral copies  (<250 copies / mL). A negative result must be combined with clinical  observations, patient history, and epidemiological information. If result is POSITIVE SARS-CoV-2 target nucleic acids are DETECTED. The SARS-CoV-2 RNA is generally detectable in upper and lower  respiratory specimens dur ing the acute phase of infection.  Positive  results are indicative of active infection with SARS-CoV-2.  Clinical  correlation with patient history and other diagnostic information is  necessary to determine patient infection status.  Positive results do  not rule out bacterial infection or co-infection with other viruses. If result is PRESUMPTIVE POSTIVE SARS-CoV-2 nucleic acids MAY BE PRESENT.   A presumptive positive result was obtained on the submitted specimen  and confirmed on repeat testing.  While 2019 novel coronavirus  (SARS-CoV-2) nucleic acids may be present in the submitted sample  additional confirmatory testing may be necessary for epidemiological  and / or clinical management purposes  to differentiate between  SARS-CoV-2 and other Sarbecovirus currently known to infect humans.  If clinically indicated additional testing with an alternate test  methodology [phone])  is advised. The SARS-CoV-2 RNA is generally  detectable in upper and lower respiratory sp ecimens during the acute  phase of infection. The expected result is Negative. Fact Sheet for Patients:  StrictlyIdeas.no Fact Sheet for Healthcare Providers: BankingDealers.co.za This test is not yet approved or cleared by the [place] FDA and has been authorized for detection and/or diagnosis of SARS-CoV-2 by FDA under an Emergency Use Authorization (EUA).  This EUA will remain in effect (meaning this test can be used) for the duration of the COVID-19 declaration under Section 564(b)(1) of the Act, 21 U.S.C. section 360bbb-3(b)(1), unless the authorization is terminated or revoked sooner. Performed at [facility], [place] [address], [place], [place] [address]     Radiology Reports Dg Lumbar Spine Complete  Result Date: 02/15/2019 CLINICAL DATA:  Lower extremity weakness. EXAM: LUMBAR SPINE - COMPLETE 4+ VIEW COMPARISON:  CT and pelvis dated 03/03/2018 FINDINGS: There is no displaced fracture. Degenerative changes are noted of the lumbar spine with moderate to severe disc height loss at the L5-S1 level. There is facet arthrosis of the lower lumbar segments. Atherosclerotic changes are noted of the abdominal aorta. A phlebolith projects over the patient's left hemipelvis. A moderate amount of stool is noted in the colon. IMPRESSION: 1. No acute osseous abnormality. 2. Multilevel degenerative changes, greatest at the L5-S1 level. 3. Atherosclerotic changes of the abdominal aorta. Electronically Signed   By: Constance Holster M.D.   On: 02/15/2019 17:38   Dg Chest Port 1v Same Day  Result Date: 02/17/2019 CLINICAL DATA:  Dyspnea. EXAM: PORTABLE CHEST 1 VIEW COMPARISON:  CT scan of October 31, 2015. FINDINGS: The heart size and mediastinal contours are within normal limits. Both lungs are clear. No pneumothorax or pleural effusion is noted. The  visualized skeletal structures are unremarkable. IMPRESSION: No active disease. Electronically Signed   By: Marijo Conception M.D.   On: 02/17/2019 09:06    Lab Data:  CBC: Recent Labs  Lab 02/15/19 1203 02/16/19 1400 02/17/19 0212  WBC 4.5 4.9 4.6  NEUTROABS 2.7 2.6  --   HGB 15.6 13.7 13.0  HCT 46.1 41.2 39.0  MCV 92.3 91.8 91.8  PLT 224.0 213 440   Basic Metabolic Panel: Recent Labs  Lab 02/15/19 1203 02/16/19 1400 02/16/19 2321  NA  138 139 139  K 4.7 3.7 3.8  CL 100 101 107  CO2 '31 27 27  '$ GLUCOSE 97 90 94  BUN '16 11 10  '$ CREATININE 0.65 0.68 0.62  CALCIUM 9.4 9.2 8.6*   GFR: Estimated Creatinine Clearance: 92.1 mL/min (by C-G formula based on SCr of 0.62 mg/dL). Liver Function Tests: Recent Labs  Lab 02/15/19 1203 02/16/19 1524 02/16/19 2321  AST 499* 469* 475*  ALT 518* 504* 465*  ALKPHOS 67 59 52  BILITOT 0.6 0.4 0.5  PROT 7.1 6.6 5.9*  ALBUMIN 4.2 3.4* 3.1*   No results for input(s): LIPASE, AMYLASE in the last 168 hours. No results for input(s): AMMONIA in the last 168 hours. Coagulation Profile: No results for input(s): INR, PROTIME in the last 168 hours. Cardiac Enzymes: Recent Labs  Lab 02/15/19 1203 02/16/19 1400 02/16/19 2321 02/17/19 0655  CKTOTAL 14,781* 16,516* 17,630*  --   TROPONINI  --   --   --  0.03*   BNP (last 3 results) No results for input(s): PROBNP in the last 8760 hours. HbA1C: No results for input(s): HGBA1C in the last 72 hours. CBG: No results for input(s): GLUCAP in the last 168 hours. Lipid Profile: Recent Labs    02/15/19 1203  CHOL 164  HDL 33.30*  LDLCALC 102*  TRIG 144.0  CHOLHDL 5   Thyroid Function Tests: Recent Labs    02/15/19 1203  TSH 4.86*   Anemia Panel: Recent Labs    02/15/19 1203  VITAMINB12 162*   Urine analysis:    Component Value Date/Time   COLORURINE AMBER (A) 02/16/2019 1627   APPEARANCEUR CLOUDY (A) 02/16/2019 1627   APPEARANCEUR Clear 11/28/2017 0944   LABSPEC 1.023  02/16/2019 1627   PHURINE 5.0 02/16/2019 1627   GLUCOSEU NEGATIVE 02/16/2019 1627   HGBUR LARGE (A) 02/16/2019 1627   BILIRUBINUR NEGATIVE 02/16/2019 1627   BILIRUBINUR Negative 11/28/2017 [place] 02/16/2019 1627   PROTEINUR 100 (A) 02/16/2019 1627   NITRITE NEGATIVE 02/16/2019 1627   LEUKOCYTESUR NEGATIVE 02/16/2019 1627     Ripudeep Rai M.D. [facility] 02/17/2019, 10:43 AM  Pager: [phone] Between 7am to 7pm - call Pager - [phone]  After 7pm go to [email or web address] - password [email or web address]  Call night coverage person covering after 7pm

## 2019-02-17 NOTE — Progress Notes (Signed)
Attempted echo.  Patient heading to MRI
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
## 2019-02-18 ENCOUNTER — Inpatient Hospital Stay (HOSPITAL_COMMUNITY): Payer: Medicare Other

## 2019-02-18 DIAGNOSIS — R0600 Dyspnea, unspecified: Secondary | ICD-10-CM

## 2019-02-18 DIAGNOSIS — R740 Nonspecific elevation of levels of transaminase and lactic acid dehydrogenase [LDH]: Secondary | ICD-10-CM

## 2019-02-18 LAB — COMPREHENSIVE METABOLIC PANEL
ALT: 466 U/L — ABNORMAL HIGH (ref 0–44)
AST: 454 U/L — ABNORMAL HIGH (ref 15–41)
Albumin: 3.2 g/dL — ABNORMAL LOW (ref 3.5–5.0)
Alkaline Phosphatase: 60 U/L (ref 38–126)
Anion gap: 6 (ref 5–15)
BUN: 9 mg/dL (ref 8–23)
CO2: 25 mmol/L (ref 22–32)
Calcium: 8.8 mg/dL — ABNORMAL LOW (ref 8.9–10.3)
Chloride: 104 mmol/L (ref 98–111)
Creatinine, Ser: 0.65 mg/dL (ref 0.61–1.24)
GFR calc Af Amer: >60 mL/min (ref >60)
GFR calc non Af Amer: >60 mL/min (ref >60)
Glucose, Bld: 102 mg/dL — ABNORMAL HIGH (ref 70–99)
Potassium: 3.8 mmol/L (ref 3.5–5.1)
Sodium: 135 mmol/L (ref 135–145)
Total Bilirubin: 0.5 mg/dL (ref 0.3–1.2)
Total Protein: 6 g/dL — ABNORMAL LOW (ref 6.5–8.1)

## 2019-02-18 LAB — SEDIMENTATION RATE: Sed Rate: 12 mm/hr (ref 0–16)

## 2019-02-18 LAB — CBC
HCT: 40 % (ref 39.0–52.0)
Hemoglobin: 13.4 g/dL (ref 13.0–17.0)
MCH: 30.6 pg (ref 26.0–34.0)
MCHC: 33.5 g/dL (ref 30.0–36.0)
MCV: 91.3 fL (ref 80.0–100.0)
Platelets: 202 10*3/uL (ref 150–400)
RBC: 4.38 MIL/uL (ref 4.22–5.81)
RDW: 12.2 % (ref 11.5–15.5)
WBC: 3.6 10*3/uL — ABNORMAL LOW (ref 4.0–10.5)
nRBC: 0 % (ref 0.0–0.2)

## 2019-02-18 LAB — CK
Total CK: 16393 U/L — ABNORMAL HIGH (ref 49–397)
Total CK: 15260 U/L — ABNORMAL HIGH (ref 49–397)

## 2019-02-18 LAB — MYOGLOBIN, SERUM
Myoglobin: 5449 ng/mL — ABNORMAL HIGH (ref 28–72)
Myoglobin: 5261 ng/mL — ABNORMAL HIGH (ref 28–72)

## 2019-02-18 MED ORDER — CEFAZOLIN SODIUM-DEXTROSE 2-4 GM/100ML-% IV SOLN: 2.0000 g | INTRAVENOUS | Status: AC

## 2019-02-18 MED ORDER — LEVOTHYROXINE SODIUM 25 MCG PO TABS
25.0000 ug | ORAL_TABLET | Freq: Every day | ORAL | Status: DC
  Administered 2019-02-19 – 2019-02-20 (×2): 25 ug via ORAL
  Filled 2019-02-18 (×2): qty 1

## 2019-02-18 MED ORDER — CHLORHEXIDINE GLUCONATE CLOTH 2 % EX PADS
6.0000 | MEDICATED_PAD | Freq: Once | CUTANEOUS | Status: AC
  Administered 2019-02-18: 6 via TOPICAL

## 2019-02-18 MED ORDER — CHLORHEXIDINE GLUCONATE CLOTH 2 % EX PADS
6.0000 | MEDICATED_PAD | Freq: Once | CUTANEOUS | Status: AC
  Administered 2019-02-19: 6 via TOPICAL

## 2019-02-18 MED ORDER — GADOBUTROL 1 MMOL/ML IV SOLN
8.0000 mL | Freq: Once | INTRAVENOUS | Status: AC | PRN
  Administered 2019-02-18: 8 mL via INTRAVENOUS

## 2019-02-18 NOTE — Progress Notes (Signed)
[facility]                                                                              Patient Demographics  Stanley Boyd, is a 68 y.o. male, DOB - 03-Aug-1951, YSA:[id number]  Admit date - 02/16/2019   Admitting Physician Karmen Bongo, MD  Outpatient Primary MD for the patient is [name], MD  Outpatient specialists:   LOS - 2  days   Medical records reviewed and are as summarized below:    Chief Complaint  Patient presents with   Dehydration       Brief summary   Patient is a 68 year old male with CAD, hyperlipidemia presented with increasing weakness.  Patient had not been able to lift his legs, feeling increasingly weak, hard to ambulate.  Patient saw his PCP, CK level was elevated and was recommended hospitalization and further work-up.  Patient reported symptoms started about a month ago and worse in the last 2 weeks starts from the calves down.  Patient ports unable to sit up without assistance and has progressed.  No worsening upper extremity weakness.  Denied any numbness, tingling, respiratory compromise.  He has increasing falls over the past 4 to 5 weeks, on injectable alirocumab for last 1 year.  He also reported some weight loss over the past few weeks to months, unintentional but attributed to decreased appetite and not hydrating himself well.  He has rash on his chest and back for the past 4 to 6 weeks CK elevated 14,000 at PCPs office, CK 16,516 in ED. initially concern for (syndrome, patient was admitted for further work-up. COVID-19 negative  Assessment & Plan    Principal Problem: Progressively worsening proximal muscle weakness with rhabdomyolysis -At baseline has some right-sided weakness from his prior C-spine injury -CK level stable and not trending up, for now continue IV fluid hydration, likely start on steroids after the biopsy done tomorrow -Work-up in progress, rash not consistent with dermatomyositis.  ESR normal, CRP 1.1.  Aldolase, ESR, ANA, anti-Jo antibody, anti-scleroderma antibodies pending - RMSF, Lyme and Ehrlichia antibody panel, paraneoplastic panel in process - Praluent currently on hold, per neurology unlikely to have caused myopathy as patient has been on it for almost a year.   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  Repeat thyroid studies in 4 weeks -Muscle and skin biopsy in a.m. -MRI of the left thigh shows diffuse muscular edema, wide differential, recommended targeting rectus femoris muscle.  Active Problems:   Hyperlipidemia -Praluent currently on hold  Acute transaminitis -LFTs mildly trending down, acute hepatitis panel pending  -Likely due to acute rhabdo    BPH (benign prostatic hyperplasia) -Continue Flomax   History of CAD with PCI in  06/2015 -Cardiology following, currently no acute chest pain or shortness of breath   Code Status: Full code DVT Prophylaxis:  Lovenox  Family Communication: Discussed in detail with the patient, all imaging results, lab results explained to the patient    Disposition Plan: Inpatient work-up in progress  Time Spent in minutes 25 minutes  Procedures:  None  Consultants:   Neurology Cardiology General surgery  Antimicrobials:   Anti-infectives (From admission, onward)   None         Medications  Scheduled Meds:  aspirin  81 mg Oral QHS   docusate sodium  100 mg Oral BID   enoxaparin (LOVENOX) injection  40 mg Subcutaneous Q24H   finasteride  5  mg Oral QHS   tamsulosin  0.4 mg Oral Q supper   Continuous Infusions:  sodium chloride 150 mL/hr at 02/18/19 0459   PRN Meds:.morphine injection, ondansetron **OR** ondansetron (ZOFRAN) IV      Subjective:   Stanley Boyd was seen and examined today.  Sitting up in the chair, states no worsening of the pain or muscle weakness.  Denies any chest pain or shortness of breath.  Overnight no acute issues.  Afebrile.   Patient denies dizziness, chest pain, shortness of breath, abdominal pain, N/V/D/C.  No numbness or tingling.   Objective:   Vitals:   02/17/19 0848 02/17/19 1632 02/17/19 1929 02/18/19 0432  BP: 121/63 128/65 134/63 131/69  Pulse: (!) 52 (!) 55 (!) 50 (!) 50  Resp: _0 Temp: 98 F (36.7 C) (!) 97 F (36.1 C) (!) 97.3 F (36.3 C) (!) 97.1 F (36.2 C)  TempSrc: Oral Oral    SpO2: 98% 98% 98% 97%  Weight:    84 kg  Height:        Intake/Output Summary (Last 24 hours) at 02/18/2019 1330 Last data filed at 02/18/2019 1004 Gross per 24 hour  Intake 960 ml  Output 5055 ml  Net -4095 ml     Wt Readings from Last 3 Encounters:  02/18/19 84 kg  02/15/19 81.5 kg  02/06/18 85.5 kg   Physical Exam  General: Alert and oriented x 3, NAD  Eyes:   HEENT:    Cardiovascular: S1 S2 clear, RRR no pedal edema b/l  Respiratory: CTAB, no wheezing, rales or rhonchi  Gastrointestinal: Soft, nontender, nondistended, NBS  Ext: no pedal edema bilaterally  Neuro: No worsening of the weakness  Musculoskeletal: No cyanosis, clubbing  Skin: Erythematous rash on the left chest   Psych: Normal affect and demeanor, alert and oriented x3      Data Reviewed:  I have personally reviewed following labs and imaging studies  Micro Results Recent Results (from the past 240 hour(s))  SARS Coronavirus 2 (CEPHEID - Performed in [facility] lab), Hosp Order     Status: None   Collection Time: 02/16/19  4:21 PM  Result Value Ref Range Status   SARS  Coronavirus 2 NEGATIVE NEGATIVE Final    Comment: (NOTE) If result is NEGATIVE SARS-CoV-2 target nucleic acids are NOT DETECTED. The SARS-CoV-2 RNA is generally detectable in upper and lower  respiratory specimens during the acute phase of infection. The lowest  concentration of SARS-CoV-2 viral copies this assay can detect is 250  copies / mL. A negative result does not preclude SARS-CoV-2 infection  and should not be used as the sole basis for treatment or other  patient management decisions.  A negative result may occur with  improper specimen collection /  handling, submission of specimen other  than nasopharyngeal swab, presence of viral mutation(s) within the  areas targeted by this assay, and inadequate number of viral copies  (<250 copies / mL). A negative result must be combined with clinical  observations, patient history, and epidemiological information. If result is POSITIVE SARS-CoV-2 target nucleic acids are DETECTED. The SARS-CoV-2 RNA is generally detectable in upper and lower  respiratory specimens dur ing the acute phase of infection.  Positive  results are indicative of active infection with SARS-CoV-2.  Clinical  correlation with patient history and other diagnostic information is  necessary to determine patient infection status.  Positive results do  not rule out bacterial infection or co-infection with other viruses. If result is PRESUMPTIVE POSTIVE SARS-CoV-2 nucleic acids MAY BE PRESENT.   A presumptive positive result was obtained on the submitted specimen  and confirmed on repeat testing.  While 2019 novel coronavirus  (SARS-CoV-2) nucleic acids may be present in the submitted sample  additional confirmatory testing may be necessary for epidemiological  and / or clinical management purposes  to differentiate between  SARS-CoV-2 and other Sarbecovirus currently known to infect humans.  If clinically indicated additional testing with an alternate test   methodology [phone]) is advised. The SARS-CoV-2 RNA is generally  detectable in upper and lower respiratory sp ecimens during the acute  phase of infection. The expected result is Negative. Fact Sheet for Patients:  StrictlyIdeas.no Fact Sheet for Healthcare Providers: BankingDealers.co.za This test is not yet approved or cleared by the [place] FDA and has been authorized for detection and/or diagnosis of SARS-CoV-2 by FDA under an Emergency Use Authorization (EUA).  This EUA will remain in effect (meaning this test can be used) for the duration of the COVID-19 declaration under Section 564(b)(1) of the Act, 21 U.S.C. section 360bbb-3(b)(1), unless the authorization is terminated or revoked sooner. Performed at [facility], [place] [address] [place], [place] [address]     Radiology Reports Dg Lumbar Spine Complete  Result Date: 02/15/2019 CLINICAL DATA:  Lower extremity weakness. EXAM: LUMBAR SPINE - COMPLETE 4+ VIEW COMPARISON:  CT and pelvis dated 03/03/2018 FINDINGS: There is no displaced fracture. Degenerative changes are noted of the lumbar spine with moderate to severe disc height loss at the L5-S1 level. There is facet arthrosis of the lower lumbar segments. Atherosclerotic changes are noted of the abdominal aorta. A phlebolith projects over the patient's left hemipelvis. A moderate amount of stool is noted in the colon. IMPRESSION: 1. No acute osseous abnormality. 2. Multilevel degenerative changes, greatest at the L5-S1 level. 3. Atherosclerotic changes of the abdominal aorta. Electronically Signed   By: Constance Holster M.D.   On: 02/15/2019 17:38   Ct Chest W Contrast  Result Date: 02/17/2019 CLINICAL DATA:  Generalized muscle weakness, evaluate for malignant etiology EXAM: CT CHEST, ABDOMEN, AND PELVIS WITH CONTRAST TECHNIQUE: Multidetector CT imaging of the chest, abdomen and pelvis was performed following the  standard protocol during bolus administration of intravenous contrast. CONTRAST:  125m OMNIPAQUE IOHEXOL 300 MG/ML  SOLN COMPARISON:  CT abdomen pelvis, 03/03/2018, CT chest, 10/12/2014 FINDINGS: CT CHEST FINDINGS Cardiovascular: Extensive 3 vessel coronary artery calcifications and/or stents. Normal heart size. No pericardial effusion. Mediastinum/Nodes: No enlarged mediastinal, hilar, or axillary lymph nodes. Thyroid gland, trachea, and esophagus demonstrate no significant findings. Lungs/Pleura: Multiple small benign calcified pulmonary nodules present bilaterally. Innumerable tiny centrilobular pulmonary nodules in an apically predominant distribution, which may reflect sequelae of atypical infection or smoking-related respiratory bronchiolitis. There  is an unchanged, benign somewhat bandlike consolidation in the right lung base. No pleural effusion or pneumothorax. Musculoskeletal: No chest wall mass or suspicious bone lesions identified. CT ABDOMEN PELVIS FINDINGS Hepatobiliary: No focal liver abnormality is seen. No gallstones, gallbladder wall thickening, or biliary dilatation. Pancreas: Unremarkable. No pancreatic ductal dilatation or surrounding inflammatory changes. Spleen: Normal in size without focal abnormality. Adrenals/Urinary Tract: Adrenal glands are unremarkable. Kidneys are normal, without renal calculi, focal lesion, or hydronephrosis. Bladder is unremarkable. Stomach/Bowel: Stomach is within normal limits. Appendix appears normal. No evidence of bowel wall thickening, distention, or inflammatory changes. Vascular/Lymphatic: Severe mixed calcific atherosclerosis of the abdominal aorta and branch vessels. No enlarged abdominal or pelvic lymph nodes. Reproductive: Prostatomegaly. Other: No abdominal wall hernia or abnormality. No abdominopelvic ascites. Musculoskeletal: No acute or significant osseous findings. IMPRESSION: 1.  No CT evidence of malignancy in the chest, abdomen, or pelvis. 2.  Innumerable tiny centrilobular pulmonary nodules in an apically predominant distribution, which may reflect sequelae of atypical infection or smoking-related respiratory bronchiolitis. There is an unchanged, benign somewhat bandlike consolidation in the right lung base. 3.  Coronary artery disease. 4.  Atherosclerosis. 5.  Prostatomegaly. Electronically Signed   By: Eddie Candle M.D.   On: 02/17/2019 16:25   Mr Cervical Spine Wo Contrast  Result Date: 02/17/2019 CLINICAL DATA:  Myelopathy. Progressive lower extremity weakness. Multiple falls. 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 EXAM: MRI CERVICAL SPINE WITHOUT CONTRAST TECHNIQUE: Multiplanar, multisequence MR imaging of the cervical spine was performed. No intravenous contrast was administered. COMPARISON:  None. FINDINGS: Alignment: Minimal retrolisthesis of C3 on C4. Vertebrae: C4-5 and C5-6 interbody osseous fusion with anterior fusion plate and screws in place at C4-5. No fracture, suspicious osseous lesion, or significant marrow edema. Chronic degenerative endplate changes at O2-4 greater than C3-4 associated with disc space narrowing. Cord: Well-defined T2 hyperintensity in the cord bilaterally at C4 and C5 with mild cord volume loss more notable at C4 consistent with myelomalacia. Posterior Fossa, vertebral arteries, paraspinal tissues: Dominant appearing left vertebral artery with preserved flow void. Poor visualization of the right vertebral artery which is likely hypoplastic. Unremarkable included portion of the posterior fossa. Disc levels: C2-3: Minimal disc bulging and moderate left facet arthrosis without stenosis. C3-4: Disc bulging and uncovertebral spurring result in mild-to-moderate spinal stenosis and moderate right and mild-to-moderate left neural foraminal stenosis. C4-5: Anterior fusion.  No significant stenosis. C5-6: Anterior fusion. Right-sided osteophytic ridging results in mild right neural foraminal  stenosis. Patent spinal canal and left neural foramen. C6-7: Broad-based posterior disc osteophyte complex results in moderate spinal stenosis and moderate right and mild left neural foraminal stenosis. C7-T1: Negative. IMPRESSION: 1. 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 2. Solid anterior fusion at C4-5 and C5-6 without spinal stenosis at these levels. 3. Adjacent segment degenerative changes with moderate spinal stenosis at C6-7, mild-to-moderate spinal stenosis at C3-4, and mild-to-moderate neural foraminal stenosis at both levels. Electronically Signed   By: Logan Bores M.D.   On: 02/17/2019 12:17   Mr Thoracic Spine Wo Contrast  Result Date: 02/17/2019 CLINICAL DATA:  Myelopathy. Progressive lower extremity weakness. Multiple falls. 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 EXAM: MRI THORACIC SPINE WITHOUT CONTRAST TECHNIQUE: Multiplanar, multisequence MR imaging of the thoracic spine was performed. No intravenous contrast was administered. COMPARISON:  Chest CT 10/31/2015 FINDINGS: Alignment:  Normal. Vertebrae: No fracture, suspicious osseous lesion, or significant marrow edema. Small hemangioma in the T8 vertebral body. Cord: Slight prominence of the central  canal of the spinal cord in the mid to lower thoracic spine most notably at T9-10. No frank syrinx formation. Normal cord signal elsewhere. Paraspinal and other soft tissues: Partially visualized 1.4 cm T2 hyperintense lesion in the upper pole of the right kidney as well as a subcentimeter T2 hyperintensity in the upper pole of the left kidney, likely cysts but incompletely evaluated. Trace bilateral pleural fluid. Disc levels: Mild disc bulging at T1-2, T2-3, and T5-6. Tiny left paracentral disc protrusions at T6-7 and T7-8. No spinal stenosis or cord compression. Mild facet arthrosis in the upper and lower thoracic spine without evidence of compressive neural foraminal stenosis. IMPRESSION: 1. Mild thoracic disc and facet  degeneration without evidence of neural impingement. 2. Minimal prominence of the central canal of the mid to lower thoracic spinal cord without syrinx. Electronically Signed   By: Logan Bores M.D.   On: 02/17/2019 12:36   Mr Lumbar Spine Wo Contrast  Result Date: 02/17/2019 CLINICAL DATA:  Myelopathy. Progressive lower extremity weakness. Multiple falls. 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 EXAM: MRI LUMBAR SPINE WITHOUT CONTRAST TECHNIQUE: Multiplanar, multisequence MR imaging of the lumbar spine was performed. No intravenous contrast was administered. COMPARISON:  Lumbar spine radiographs 02/15/2019. CT abdomen and pelvis 03/03/2018. FINDINGS: Segmentation:  Standard. Alignment:  Normal. Vertebrae: No fracture or suspicious osseous lesion. Moderate degenerative endplate changes at V4-B4, predominantly Modic type 2. Conus medullaris and cauda equina: Conus extends to the T12-L1 level. Conus and cauda equina appear normal. Paraspinal and other soft tissues: Unremarkable. Disc levels: Disc desiccation throughout the lumbar spine with exception of L1-2. Moderate to severe disc space narrowing at L5-S1 with milder narrowing at L2-3, L3-4, and L4-5. L1-2: Normal disc.  Slight facet hypertrophy without stenosis. L2-3: Circumferential disc bulging and mild facet and ligamentum flavum hypertrophy without significant stenosis. L3-4: Circumferential disc bulging, a small right foraminal to extraforaminal disc protrusion, and mild facet and ligamentum flavum hypertrophy result in borderline lateral recess stenosis and mild bilateral neural foraminal stenosis with the disc protrusion potentially affecting the exiting right L3 nerve root. No significant spinal stenosis. L4-5: Circumferential disc bulging, a right foraminal to extraforaminal disc osteophyte complex, and mild facet and ligamentum flavum hypertrophy result in mild right neural foraminal stenosis with the disc osteophyte complex  potentially affecting the right L4 nerve lateral to the foramen. No significant spinal stenosis. L5-S1: Prior left laminectomy. Broad central disc protrusion, disc bulging asymmetric to the left, and mild facet hypertrophy result in mild right and moderate left neural foraminal stenosis without spinal stenosis. There is likely some epidural fibrosis in the left lateral recess. IMPRESSION: 1. Relatively diffuse lumbar disc and facet degeneration without significant spinal stenosis. 2. Multilevel neural foraminal stenosis, moderate on the left at L5-S1. 3. Normal appearance of the conus medullaris and cauda equina. Electronically Signed   By: Logan Bores M.D.   On: 02/17/2019 12:28   Ct Abdomen Pelvis W Contrast  Result Date: 02/17/2019 CLINICAL DATA:  Generalized muscle weakness, evaluate for malignant etiology EXAM: CT CHEST, ABDOMEN, AND PELVIS WITH CONTRAST TECHNIQUE: Multidetector CT imaging of the chest, abdomen and pelvis was performed following the standard protocol during bolus administration of intravenous contrast. CONTRAST:  181m OMNIPAQUE IOHEXOL 300 MG/ML  SOLN COMPARISON:  CT abdomen pelvis, 03/03/2018, CT chest, 10/12/2014 FINDINGS: CT CHEST FINDINGS Cardiovascular: Extensive 3 vessel coronary artery calcifications and/or stents. Normal heart size. No pericardial effusion. Mediastinum/Nodes: No enlarged mediastinal, hilar, or axillary lymph nodes. Thyroid gland, trachea, and  esophagus demonstrate no significant findings. Lungs/Pleura: Multiple small benign calcified pulmonary nodules present bilaterally. Innumerable tiny centrilobular pulmonary nodules in an apically predominant distribution, which may reflect sequelae of atypical infection or smoking-related respiratory bronchiolitis. There is an unchanged, benign somewhat bandlike consolidation in the right lung base. No pleural effusion or pneumothorax. Musculoskeletal: No chest wall mass or suspicious bone lesions identified. CT ABDOMEN PELVIS  FINDINGS Hepatobiliary: No focal liver abnormality is seen. No gallstones, gallbladder wall thickening, or biliary dilatation. Pancreas: Unremarkable. No pancreatic ductal dilatation or surrounding inflammatory changes. Spleen: Normal in size without focal abnormality. Adrenals/Urinary Tract: Adrenal glands are unremarkable. Kidneys are normal, without renal calculi, focal lesion, or hydronephrosis. Bladder is unremarkable. Stomach/Bowel: Stomach is within normal limits. Appendix appears normal. No evidence of bowel wall thickening, distention, or inflammatory changes. Vascular/Lymphatic: Severe mixed calcific atherosclerosis of the abdominal aorta and branch vessels. No enlarged abdominal or pelvic lymph nodes. Reproductive: Prostatomegaly. Other: No abdominal wall hernia or abnormality. No abdominopelvic ascites. Musculoskeletal: No acute or significant osseous findings. IMPRESSION: 1.  No CT evidence of malignancy in the chest, abdomen, or pelvis. 2. Innumerable tiny centrilobular pulmonary nodules in an apically predominant distribution, which may reflect sequelae of atypical infection or smoking-related respiratory bronchiolitis. There is an unchanged, benign somewhat bandlike consolidation in the right lung base. 3.  Coronary artery disease. 4.  Atherosclerosis. 5.  Prostatomegaly. Electronically Signed   By: Eddie Candle M.D.   On: 02/17/2019 16:25   Mr Femur Left W Wo Contrast  Result Date: 02/18/2019 CLINICAL DATA:  Progressive lower extremity and proximal muscle weakness. Elevated CK levels and elevated liver function tests. EXAM: MR OF THE LEFT LOWER EXTREMITY WITHOUT AND WITH CONTRAST TECHNIQUE: Multiplanar, multisequence MR imaging of the left thigh was performed both before and after administration of intravenous contrast. CONTRAST:  8 cc Gadavist COMPARISON:  None. FINDINGS: Bones/Joint/Cartilage Unremarkable Ligaments N/A Muscles and Tendons There is abnormal diffuse muscle edema and enhancement  primarily involving the gluteus maximus, gluteus medius, hip adductor musculature, hamstring musculature, tensor fascia lata, iloipsoas, and medial head gastrocnemius muscles. Coronal images reveal is to be a roughly symmetric process. Much lesser degree of involvement in the gluteus maximus, vastus intermedius, and vastus lateralis muscles with only focal low-grade activity in these muscle groups. No abscess or discrete fluid collection is identified. Soft tissues No overt cutaneous or subcutaneous edema identified. IMPRESSION: 1. Abnormal diffuse muscular edema and enhancement involving various muscles of the lower pelvis and thigh as detailed above. The coronal images indicate that this is a grossly symmetric process. I note that there is a plan for quadriceps muscle biopsy; I would recommend targeting the rectus femoris muscle and referring to today's MRI images given that the other quadriceps muscles demonstrate only minimal involvement. 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 Electronically Signed   By: Van Clines M.D.   On: 02/18/2019 11:41   Dg Chest Port 1v Same Day  Result Date: 02/17/2019 CLINICAL DATA:  Dyspnea. EXAM: PORTABLE CHEST 1 VIEW COMPARISON:  CT scan of October 31, 2015. FINDINGS: The heart size and mediastinal contours are within normal limits. Both lungs are clear. No pneumothorax or pleural effusion is noted. The visualized skeletal structures are unremarkable. IMPRESSION: No active disease. Electronically Signed   By: Jeneen Rinks  Murlean Caller M.D.   On: 02/17/2019 09:06    Lab Data:  CBC: Recent Labs  Lab 02/15/19 1203 02/16/19 1400 02/17/19 0212 02/18/19 0042   WBC 4.5 4.9 4.6 3.6*  NEUTROABS 2.7 2.6  --   --   HGB 15.6 13.7 13.0 13.4  HCT 46.1 41.2 39.0 40.0  MCV 92.3 91.8 91.8 91.3  PLT 224.0 213 196 287   Basic Metabolic Panel: Recent Labs  Lab 02/15/19 1203 02/16/19 1400 02/16/19 2321 02/17/19 1438 02/18/19 0042  NA 138 139 139 138 135  K 4.7 3.7 3.8 3.9 3.8  CL 100 101 107 103 104  CO2 _0 GLUCOSE 97 90 94 95 102*  BUN _1 CREATININE 0.65 0.68 0.62 0.64 0.65  CALCIUM 9.4 9.2 8.6* 8.9 8.8*   GFR: Estimated Creatinine Clearance: 92.1 mL/min (by C-G formula based on SCr of 0.65 mg/dL). Liver Function Tests: Recent Labs  Lab 02/15/19 1203 02/16/19 1524 02/16/19 2321 02/17/19 1438 02/18/19 0042  AST 499* 469* 475* 490* 454*  ALT 518* 504* 465* 493* 466*  ALKPHOS 67 59 52 57 60  BILITOT 0.6 0.4 0.5 0.4 0.5  PROT 7.1 6.6 5.9* 6.4* 6.0*  ALBUMIN 4.2 3.4* 3.1* 3.4* 3.2*   No results for input(s): LIPASE, AMYLASE in the last 168 hours. No results for input(s): AMMONIA in the last 168 hours. Coagulation Profile: No results for input(s): INR, PROTIME in the last 168 hours. Cardiac Enzymes: Recent Labs  Lab 02/15/19 1203 02/16/19 1400 02/16/19 2321 02/17/19 0655 02/17/19 1438 02/18/19 0042  CKTOTAL 14,781* 16,516* 17,630*  --  16,916* 16,393*  TROPONINI  --   --   --  0.03*  --   --    BNP (last 3 results) No results for input(s): PROBNP in the last 8760 hours. HbA1C: No results for input(s): HGBA1C in the last 72 hours. CBG: No results for input(s): GLUCAP in the last 168 hours. Lipid Profile: No results for input(s): CHOL, HDL, LDLCALC, TRIG, CHOLHDL, LDLDIRECT in the last 72 hours. Thyroid Function Tests: Recent Labs    02/17/19 1438  FREET4 0.78*   Anemia Panel: No results for input(s): VITAMINB12, FOLATE, FERRITIN, TIBC, IRON, RETICCTPCT in the last 72 hours. Urine analysis:    Component Value Date/Time   COLORURINE AMBER (A) 02/16/2019 1627   APPEARANCEUR CLOUDY (A)  02/16/2019 1627   APPEARANCEUR Clear 11/28/2017 0944   LABSPEC 1.023 02/16/2019 1627   PHURINE 5.0 02/16/2019 1627   GLUCOSEU NEGATIVE 02/16/2019 1627   HGBUR LARGE (A) 02/16/2019 1627   BILIRUBINUR NEGATIVE 02/16/2019 1627   BILIRUBINUR Negative 11/28/2017 0944   KETONESUR NEGATIVE 02/16/2019 1627   PROTEINUR 100 (A) 02/16/2019 1627   NITRITE NEGATIVE 02/16/2019 1627   LEUKOCYTESUR NEGATIVE 02/16/2019 1627     Lilo Wallington M.D. [facility] 02/18/2019, 1:30 PM  Pager: [phone] Between 7am to 7pm - call Pager - [phone]  After 7pm go to [email or web address] - password [email or web address]  Call night coverage person covering after 7pm

## 2019-02-18 NOTE — Progress Notes (Signed)
patient is alert and oriented during Bedside rounding bed alarm was activated. Patient got up to sit in the chair and asked that alarm be removed I explained that due to recent fall and inpatient Iv equipment . Refused to have alarm on.

## 2019-02-18 NOTE — Plan of Care (Signed)
  Problem: Education: Goal: Ability to demonstrate management of disease process will improve Outcome: Progressing   Problem: Activity: Goal: Capacity to carry out activities will improve Outcome: Progressing   Problem: Safety: Goal: Ability to remain free from injury will improve Outcome: Progressing   Problem: Pain Managment: Goal: General experience of comfort will improve Outcome: Progressing

## 2019-02-18 NOTE — Progress Notes (Signed)
Subjective:  He is feeling better today, leg pain is less.  No chest pain or shortness of breath.  Intake/Output from previous day:  I/O last 3 completed shifts: In: 3826.3 [P.O.:1320; I.V.:2506.3] Out: 6755 [Urine:6755] Total I/O In: -  Out: 600 [Urine:600]  Blood pressure 131/69, pulse (!) 50, temperature (!) 97.1 F (36.2 C), resp. rate 18, height _0  (1.753 m), weight 84 kg, SpO2 97 %. Physical Exam  Constitutional: He appears well-developed and well-nourished. No distress.  HENT:  Head: Atraumatic.  Eyes: Conjunctivae are normal.  Neck: Neck supple. No JVD present. No thyromegaly present.  Cardiovascular: Normal rate, regular rhythm, normal heart sounds and intact distal pulses. Exam reveals no gallop.  No murmur heard. Pulmonary/Chest: Effort normal and breath sounds normal.  Abdominal: Soft. Bowel sounds are normal.  Musculoskeletal:     Comments: Proximal muscle weakness of bilateral hips and thigh, not tested today.  Neurological: He is alert.  Skin: Skin is warm and dry.  Psychiatric: He has a normal mood and affect.    Lab Results: BMP BNP (last 3 results) No results for input(s): BNP in the last 8760 hours.  ProBNP (last 3 results) No results for input(s): PROBNP in the last 8760 hours. BMP Latest Ref Rng & Units 02/18/2019 02/17/2019 02/16/2019  Glucose 70 - 99 mg/dL 102(H) 95 94  BUN 8 - 23 mg/dL _1 Creatinine 0.61 - 1.24 mg/dL 0.65 0.64 0.62  BUN/Creat Ratio 10 - 24 - - -  Sodium 135 - 145 mmol/L 135 138 139  Potassium 3.5 - 5.1 mmol/L 3.8 3.9 3.8  Chloride 98 - 111 mmol/L 104 103 107  CO2 22 - 32 mmol/L _2 Calcium 8.9 - 10.3 mg/dL 8.8(L) 8.9 8.6(L)   Hepatic Function Latest Ref Rng & Units 02/18/2019 02/17/2019 02/16/2019  Total Protein 6.5 - 8.1 g/dL 6.0(L) 6.4(L) 5.9(L)  Albumin 3.5 - 5.0 g/dL 3.2(L) 3.4(L) 3.1(L)  AST 15 - 41 U/L 454(H) 490(H) 475(H)  ALT 0 - 44 U/L 466(H) 493(H) 465(H)  Alk Phosphatase 38 - 126 U/L 60 57 52  Total  Bilirubin 0.3 - 1.2 mg/dL 0.5 0.4 0.5  Bilirubin, Direct 0.0 - 0.2 mg/dL - - -   CBC Latest Ref Rng & Units 02/18/2019 02/17/2019 02/16/2019  WBC 4.0 - 10.5 K/uL 3.6(L) 4.6 4.9  Hemoglobin 13.0 - 17.0 g/dL 13.4 13.0 13.7  Hematocrit 39.0 - 52.0 % 40.0 39.0 41.2  Platelets 150 - 400 K/uL 202 196 213   Lipid Panel     Component Value Date/Time   CHOL 164 02/15/2019 1203   CHOL 208 (H) 11/28/2017 0944   TRIG 144.0 02/15/2019 1203   HDL 33.30 (L) 02/15/2019 1203   HDL 37 (L) 11/28/2017 0944   CHOLHDL 5 02/15/2019 1203   VLDL 28.8 02/15/2019 1203   LDLCALC 102 (H) 02/15/2019 1203   LDLCALC 135 (H) 11/28/2017 0944   LDLDIRECT 90.0 04/02/2016 1231   Cardiac Panel (last 3 results) Recent Labs    02/16/19 2321 02/17/19 0655 02/17/19 1438 02/18/19 0042  CKTOTAL 17,630*  --  16,916* 94,496*  TROPONINI  --  0.03*  --   --     HEMOGLOBIN A1C No results found for: HGBA1C, MPG TSH Recent Labs    02/15/19 1203  TSH 4.86*  Cardiac Studies:  EKG: Sinus bradycardia at the rate of 50 bpm, otherwise normal EKG. Echocardiogram 04/092020 :   1. The left ventricle has normal systolic function, with an ejection fraction  of 60-65%. The cavity size was normal. Left ventricular diastolic Doppler parameters are consistent with impaired relaxation.  2. The right ventricle has normal systolc function. The cavity was normal. There is no increase in right ventricular wall thickness.  Scheduled Meds: . aspirin  81 mg Oral QHS  . docusate sodium  100 mg Oral BID  . enoxaparin (LOVENOX) injection  40 mg Subcutaneous Q24H  . finasteride  5 mg Oral QHS  . tamsulosin  0.4 mg Oral Q supper   Continuous Infusions: . sodium chloride 150 mL/hr at 02/18/19 0459   PRN Meds:.morphine injection, ondansetron **OR** ondansetron (ZOFRAN) IV  Assessment/Plan:  1.  Rhabdomyolysis, etiology unknown.  Muscle biopsy pending.  MRI of the quadriceps muscle ordered today.  Serology pending.  Muscle pain is less  today. 2.  Hyperlipidemia, patient presently on Praluent, lipids still not well controlled, will address this once medically stable. 3.  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  Presently on aspirin, continue the same.  Echocardiogram reveals no wall motion abnormality, normal LVEF.   Adrian Prows, M.D. 02/18/2019, 9:56 AM [facility] Pager: [phone] Office: [phone] If no answer: [phone]

## 2019-02-18 NOTE — Plan of Care (Signed)
  Problem: Education: Goal: Ability to demonstrate management of disease process will improve Outcome: Progressing Goal: Ability to verbalize understanding of medication therapies will improve Outcome: Progressing Goal: Individualized Educational Video(s) Outcome: Progressing   Problem: Education: Goal: Ability to verbalize understanding of medication therapies will improve Outcome: Progressing   Problem: Activity: Goal: Capacity to carry out activities will improve Outcome: Progressing   Problem: Education: Goal: Knowledge of General Education information will improve Description Including pain rating scale, medication(s)/side effects and non-pharmacologic comfort measures Outcome: Progressing   Problem: Health Behavior/Discharge Planning: Goal: Ability to manage health-related needs will improve Outcome: Progressing   Problem: Clinical Measurements: Goal: Ability to maintain clinical measurements within normal limits will improve Outcome: Progressing Goal: Will remain free from infection Outcome: Progressing Goal: Diagnostic test results will improve Outcome: Progressing Goal: Respiratory complications will improve Outcome: Progressing Goal: Cardiovascular complication will be avoided Outcome: Progressing   Problem: Activity: Goal: Risk for activity intolerance will decrease Outcome: Progressing   Problem: Nutrition: Goal: Adequate nutrition will be maintained Outcome: Progressing   Problem: Coping: Goal: Level of anxiety will decrease Outcome: Progressing

## 2019-02-18 NOTE — Consult Note (Signed)
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 visualized 1.4 cm T2 hyperintense lesion in the upper pole of the right kidney as well as a subcentimeter T2 hyperintensity in the upper pole of the left kidney, likely cysts but incompletely evaluated. Trace bilateral pleural fluid. Disc levels: Mild disc bulging at T1-2, T2-3, and T5-6. Tiny left paracentral disc protrusions at T6-7 and T7-8. No spinal stenosis or cord compression. Mild facet arthrosis in the upper and lower thoracic spine without evidence of compressive neural foraminal stenosis. IMPRESSION: 1. Mild thoracic disc and facet  degeneration without evidence of neural impingement. 2. Minimal prominence of the central canal of the mid to lower thoracic spinal cord without syrinx. Electronically Signed   By: Logan Bores M.D.   On: 02/17/2019 12:36   Mr Lumbar Spine Wo Contrast  Result Date: 02/17/2019 CLINICAL DATA:  Myelopathy. Progressive lower extremity weakness. Multiple falls. 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 EXAM: MRI LUMBAR SPINE WITHOUT CONTRAST TECHNIQUE: Multiplanar, multisequence MR imaging of the lumbar spine was performed. No intravenous contrast was administered. COMPARISON:  Lumbar spine radiographs 02/15/2019. CT abdomen and pelvis 03/03/2018. FINDINGS: Segmentation:  Standard. Alignment:  Normal. Vertebrae: No fracture or suspicious osseous lesion. Moderate degenerative endplate changes at E5-U3, predominantly Modic type 2. Conus medullaris and cauda equina: Conus extends to the T12-L1 level. Conus and cauda equina appear normal. Paraspinal and other soft tissues: Unremarkable. Disc levels: Disc desiccation throughout the lumbar  spine with exception of L1-2. Moderate to severe disc space narrowing at L5-S1 with milder narrowing at L2-3, L3-4, and L4-5. L1-2: Normal disc.  Slight facet hypertrophy without stenosis. L2-3: Circumferential disc bulging and mild facet and ligamentum flavum hypertrophy without significant stenosis. L3-4: Circumferential disc bulging, a small right foraminal to extraforaminal disc protrusion, and mild facet and ligamentum flavum hypertrophy result in borderline lateral recess stenosis and mild bilateral neural foraminal stenosis with the disc protrusion potentially affecting the exiting right L3 nerve root. No significant spinal stenosis. L4-5: Circumferential disc bulging, a right foraminal to extraforaminal disc osteophyte complex, and mild facet and ligamentum flavum hypertrophy result in mild right neural foraminal stenosis with the disc osteophyte complex  potentially affecting the right L4 nerve lateral to the foramen. No significant spinal stenosis. L5-S1: Prior left laminectomy. Broad central disc protrusion, disc bulging asymmetric to the left, and mild facet hypertrophy result in mild right and moderate left neural foraminal stenosis without spinal stenosis. There is likely some epidural fibrosis in the left lateral recess. IMPRESSION: 1. Relatively diffuse lumbar disc and facet degeneration without significant spinal stenosis. 2. Multilevel neural foraminal stenosis, moderate on the left at L5-S1. 3. Normal appearance of the conus medullaris and cauda equina. Electronically Signed   By: Logan Bores M.D.   On: 02/17/2019 12:28   Ct Abdomen Pelvis W Contrast  Result Date: 02/17/2019 CLINICAL DATA:  Generalized muscle weakness, evaluate for malignant etiology EXAM: CT CHEST, ABDOMEN, AND PELVIS WITH CONTRAST TECHNIQUE: Multidetector CT imaging of the chest, abdomen and pelvis was performed following the standard protocol during bolus administration of intravenous contrast. CONTRAST:  166mL OMNIPAQUE IOHEXOL 300 MG/ML  SOLN COMPARISON:  CT abdomen pelvis, 03/03/2018, CT chest, 10/12/2014 FINDINGS: CT CHEST FINDINGS Cardiovascular: Extensive 3 vessel coronary artery calcifications and/or stents. Normal heart size. No pericardial effusion. Mediastinum/Nodes: No enlarged mediastinal, hilar, or axillary lymph nodes. Thyroid gland, trachea, and esophagus demonstrate no significant findings. Lungs/Pleura: Multiple small benign calcified pulmonary nodules present bilaterally. Innumerable tiny centrilobular pulmonary nodules in an apically predominant distribution, which may reflect sequelae of atypical infection or smoking-related respiratory bronchiolitis. There is an unchanged, benign somewhat bandlike consolidation in the right lung base. No pleural effusion or pneumothorax. Musculoskeletal: No chest wall mass or suspicious bone lesions identified. CT ABDOMEN PELVIS  FINDINGS Hepatobiliary: No focal liver abnormality is seen. No gallstones, gallbladder wall thickening, or biliary dilatation. Pancreas: Unremarkable. No pancreatic ductal dilatation or surrounding inflammatory changes. Spleen: Normal in size without focal abnormality. Adrenals/Urinary Tract: Adrenal glands are unremarkable. Kidneys are normal, without renal calculi, focal lesion, or hydronephrosis. Bladder is unremarkable. Stomach/Bowel: Stomach is within normal limits. Appendix appears normal. No evidence of bowel wall thickening, distention, or inflammatory changes. Vascular/Lymphatic: Severe mixed calcific atherosclerosis of the abdominal aorta and branch vessels. No enlarged abdominal or pelvic lymph nodes. Reproductive: Prostatomegaly. Other: No abdominal wall hernia or abnormality. No abdominopelvic ascites. Musculoskeletal: No acute or significant osseous findings. IMPRESSION: 1.  No CT evidence of malignancy in the chest, abdomen, or pelvis. 2. Innumerable tiny centrilobular pulmonary nodules in an apically predominant distribution, which may reflect sequelae of atypical infection or smoking-related respiratory bronchiolitis. There is an unchanged, benign somewhat bandlike consolidation in the right lung base. 3.  Coronary artery disease. 4.  Atherosclerosis. 5.  Prostatomegaly. Electronically Signed   By: Eddie Candle M.D.   On: 02/17/2019 16:25   Dg Chest Port 1v Same Day  Result Date: 02/17/2019 CLINICAL DATA:  Dyspnea. EXAM: PORTABLE CHEST 1 VIEW COMPARISON:  CT scan of October 31, 2015. FINDINGS: The heart size and mediastinal contours are within normal limits. Both lungs are clear. No pneumothorax or pleural effusion is noted. The visualized skeletal structures are unremarkable. IMPRESSION: No active disease. Electronically Signed   By: Marijo Conception M.D.   On: 02/17/2019 09:06    Anti-infectives (From admission, onward)   None       Assessment/Plan CAD s/p cardiac stent x5, most  recently 07/17/2015 - cardiologist is Dr. Einar Gip [facility]  Progressively worsening muscle weakness with rhabdomyolysis Rash - Will plan for quadriceps muscle biopsy and skin biopsy tomorrow in the OR. NPO after midnight. Continue workup per primary/neurology.  ID - none VTE - SCDs, lovenox FEN - reg diet, NPO after MN Foley - none Follow up - TBD  Wellington Hampshire, [facility] [place] [facility] 02/18/2019, 9:23 AM Pager: [phone] Mon-Thurs 7:00 am-4:30 pm Fri 7:00 am -11:30 AM Sat-Sun 7:00 am-11:30 am

## 2019-02-18 NOTE — Progress Notes (Addendum)
Subjective: Patient is awake, alert, NAD. Muscle and skin biopsy scheduled for Monday 02/19/2019  Objective: Current vital signs: BP 131/69 (BP Location: Right Arm)   Pulse (!) 50   Temp (!) 97.1 F (36.2 C)   Resp 18   Ht _0  (1.753 m)   Wt 84 kg Comment: scale a  SpO2 97%   BMI 27.33 kg/m  Vital signs in last 24 hours: Temp:  [97 F (36.1 C)-97.3 F (36.3 C)] 97.1 F (36.2 C) (05/10 0432) Pulse Rate:  [50-55] 50 (05/10 0432) Resp:  [18-20] 18 (05/10 0432) BP: (128-134)/(63-69) 131/69 (05/10 0432) SpO2:  [97 %-98 %] 97 % (05/10 0432) Weight:  [84 kg] 84 kg (05/10 0432)  Intake/Output from previous day: 05/09 0701 - 05/10 0700 In: 3226.3 [P.O.:720; I.V.:2506.3] Out: 5105 [Urine:5105] Intake/Output this shift: Total I/O In: -  Out: 600 [Urine:600] Nutritional status:  Diet Order            Diet regular Room service appropriate? Yes; Fluid consistency: Thin  Diet effective now             HEENT: [place]/AT Lungs: Respirations unlabored Ext: No edema. No rash to digits.  Skin: About 3 slightly raised, faded red macules about 1 cm diameter along skin of upper abdomen. One similar lesion along medial aspect of left forearm.   Neurologic Exam: Ment: Intact to complex questions and commands CN: PERRL. EOMI. Fixates and tracks normally. Face symmetric. Tongue midline. No evidence for facial weakness.  Motor:  BUE: 4/5 deltoid, biceps, triceps and grip without asymmetry.  BLE: 4-/5 hip flexion, 4/5 knee extension, 5/5 ADF/APF Tone normal. Sensory: Decreased temp sensation to BLE, BUE and face Reflexes:  2+ brachioradialis and biceps bilaterally.  2+ patellae and achilles bilaterally. Cerebellar: No ataxia with FNF bilaterally Gait: deferred   Lab Results: Results for orders placed or performed during the hospital encounter of 02/16/19 (from the past 48 hour(s))  CBC with Differential     Status: None   Collection Time: 02/16/19  2:00 PM  Result Value Ref Range    WBC 4.9 4.0 - 10.5 K/uL   RBC 4.49 4.22 - 5.81 MIL/uL   Hemoglobin 13.7 13.0 - 17.0 g/dL   HCT 41.2 39.0 - 52.0 %   MCV 91.8 80.0 - 100.0 fL   MCH 30.5 26.0 - 34.0 pg   MCHC 33.3 30.0 - 36.0 g/dL   RDW 12.4 11.5 - 15.5 %   Platelets 213 150 - 400 K/uL   nRBC 0.0 0.0 - 0.2 %   Neutrophils Relative % 52 %   Neutro Abs 2.6 1.7 - 7.7 K/uL   Lymphocytes Relative 35 %   Lymphs Abs 1.7 0.7 - 4.0 K/uL   Monocytes Relative 10 %   Monocytes Absolute 0.5 0.1 - 1.0 K/uL   Eosinophils Relative 2 %   Eosinophils Absolute 0.1 0.0 - 0.5 K/uL   Basophils Relative 1 %   Basophils Absolute 0.0 0.0 - 0.1 K/uL   Immature Granulocytes 0 %   Abs Immature Granulocytes 0.01 0.00 - 0.07 K/uL    Comment: Performed at [facility], [address], [place],  [address]  Basic metabolic panel     Status: None   Collection Time: 02/16/19  2:00 PM  Result Value Ref Range   Sodium 139 135 - 145 mmol/L   Potassium 3.7 3.5 - 5.1 mmol/L   Chloride 101 98 - 111 mmol/L   CO2 27 22 - 32 mmol/L  Glucose, Bld 90 70 - 99 mg/dL   BUN 11 8 - 23 mg/dL   Creatinine, Ser 0.68 0.61 - 1.24 mg/dL   Calcium 9.2 8.9 - 10.3 mg/dL   GFR calc non Af Amer >60 >60 mL/min   GFR calc Af Amer >60 >60 mL/min   Anion gap 11 5 - 15    Comment: Performed at [facility] [address], [place], [place] [address]  CK     Status: Abnormal   Collection Time: 02/16/19  2:00 PM  Result Value Ref Range   Total CK 16,516 (H) 49 - 397 U/L    Comment: RESULTS CONFIRMED BY MANUAL DILUTION Performed at [facility], [place] [address] [place], [place] [address]   Hepatic function panel     Status: Abnormal   Collection Time: 02/16/19  3:24 PM  Result Value Ref Range   Total Protein 6.6 6.5 - 8.1 g/dL   Albumin 3.4 (L) 3.5 - 5.0 g/dL   AST 469 (H) 15 - 41 U/L   ALT 504 (H) 0 - 44 U/L   Alkaline Phosphatase 59 38 - 126 U/L   Total Bilirubin 0.4 0.3 - 1.2 mg/dL   Bilirubin, Direct 0.1 0.0 - 0.2 mg/dL   Indirect  Bilirubin 0.3 0.3 - 0.9 mg/dL    Comment: Performed at [facility] [address], [place], [place] [address]  SARS Coronavirus 2 (CEPHEID - Performed in [facility] hospital lab), Hosp Order     Status: None   Collection Time: 02/16/19  4:21 PM  Result Value Ref Range   SARS Coronavirus 2 NEGATIVE NEGATIVE    Comment: (NOTE) If result is NEGATIVE SARS-CoV-2 target nucleic acids are NOT DETECTED. The SARS-CoV-2 RNA is generally detectable in upper and lower  respiratory specimens during the acute phase of infection. The lowest  concentration of SARS-CoV-2 viral copies this assay can detect is 250  copies / mL. A negative result does not preclude SARS-CoV-2 infection  and should not be used as the sole basis for treatment or other  patient management decisions.  A negative result may occur with  improper specimen collection / handling, submission of specimen other  than nasopharyngeal swab, presence of viral mutation(s) within the  areas targeted by this assay, and inadequate number of viral copies  (<250 copies / mL). A negative result must be combined with clinical  observations, patient history, and epidemiological information. If result is POSITIVE SARS-CoV-2 target nucleic acids are DETECTED. The SARS-CoV-2 RNA is generally detectable in upper and lower  respiratory specimens dur ing the acute phase of infection.  Positive  results are indicative of active infection with SARS-CoV-2.  Clinical  correlation with patient history and other diagnostic information is  necessary to determine patient infection status.  Positive results do  not rule out bacterial infection or co-infection with other viruses. If result is PRESUMPTIVE POSTIVE SARS-CoV-2 nucleic acids MAY BE PRESENT.   A presumptive positive result was obtained on the submitted specimen  and confirmed on repeat testing.  While 2019 novel coronavirus  (SARS-CoV-2) nucleic acids may be present in the submitted sample   additional confirmatory testing may be necessary for epidemiological  and / or clinical management purposes  to differentiate between  SARS-CoV-2 and other Sarbecovirus currently known to infect humans.  If clinically indicated additional testing with an alternate test  methodology [phone]) is advised. The SARS-CoV-2 RNA is generally  detectable in upper and lower respiratory sp  ecimens during the acute  phase of infection. The expected result is Negative. Fact Sheet for Patients:  StrictlyIdeas.no Fact Sheet for Healthcare Providers: BankingDealers.co.za This test is not yet approved or cleared by the [place] FDA and has been authorized for detection and/or diagnosis of SARS-CoV-2 by FDA under an Emergency Use Authorization (EUA).  This EUA will remain in effect (meaning this test can be used) for the duration of the COVID-19 declaration under Section 564(b)(1) of the Act, 21 U.S.C. section 360bbb-3(b)(1), unless the authorization is terminated or revoked sooner. Performed at [facility], [place] [address], [place], [place] [address]   Urinalysis, Routine w reflex microscopic     Status: Abnormal   Collection Time: 02/16/19  4:27 PM  Result Value Ref Range   Color, Urine AMBER (A) YELLOW    Comment: BIOCHEMICALS MAY BE AFFECTED BY COLOR   APPearance CLOUDY (A) CLEAR   Specific Gravity, Urine 1.023 1.005 - 1.030   pH 5.0 5.0 - 8.0   Glucose, UA NEGATIVE NEGATIVE mg/dL   Hgb urine dipstick LARGE (A) NEGATIVE   Bilirubin Urine NEGATIVE NEGATIVE   Ketones, ur NEGATIVE NEGATIVE mg/dL   Protein, ur 100 (A) NEGATIVE mg/dL   Nitrite NEGATIVE NEGATIVE   Leukocytes,Ua NEGATIVE NEGATIVE   RBC / HPF 0-5 0 - 5 RBC/hpf   WBC, UA 0-5 0 - 5 WBC/hpf   Bacteria, UA RARE (A) NONE SEEN   Mucus PRESENT    Hyaline Casts, UA PRESENT    Granular Casts, UA PRESENT    Amorphous Crystal PRESENT     Comment: Performed at [facility], [address], [place], [place] [address]  HIV antibody (Routine Testing)     Status: None   Collection Time: 02/16/19  6:12 PM  Result Value Ref Range   HIV Screen 4th Generation wRfx Non Reactive Non Reactive    Comment: (NOTE) Performed At: [facility] [place], [place] [id number] Rush Farmer MD FA:[id number]   Comprehensive metabolic panel     Status: Abnormal   Collection Time: 02/16/19 11:21 PM  Result Value Ref Range   Sodium 139 135 - 145 mmol/L   Potassium 3.8 3.5 - 5.1 mmol/L   Chloride 107 98 - 111 mmol/L   CO2 27 22 - 32 mmol/L   Glucose, Bld 94 70 - 99 mg/dL   BUN 10 8 - 23 mg/dL   Creatinine, Ser 0.62 0.61 - 1.24 mg/dL   Calcium 8.6 (L) 8.9 - 10.3 mg/dL   Total Protein 5.9 (L) 6.5 - 8.1 g/dL   Albumin 3.1 (L) 3.5 - 5.0 g/dL   AST 475 (H) 15 - 41 U/L   ALT 465 (H) 0 - 44 U/L   Alkaline Phosphatase 52 38 - 126 U/L   Total Bilirubin 0.5 0.3 - 1.2 mg/dL   GFR calc non Af Amer >60 >60 mL/min   GFR calc Af Amer >60 >60 mL/min   Anion gap 5 5 - 15    Comment: Performed at [facility], [address], [place], [place] [address]  CK     Status: Abnormal   Collection Time: 02/16/19 11:21 PM  Result Value Ref Range   Total CK 17,630 (H) 49 - 397 U/L    Comment: RESULTS CONFIRMED BY MANUAL DILUTION Performed at [facility], [place] [address], [place], [place] [address]   CBC     Status: None   Collection Time: 02/17/19  2:12  AM  Result Value Ref Range   WBC 4.6 4.0 - 10.5 K/uL   RBC 4.25 4.22 - 5.81 MIL/uL   Hemoglobin 13.0 13.0 - 17.0 g/dL   HCT 39.0 39.0 - 52.0 %   MCV 91.8 80.0 - 100.0 fL   MCH 30.6 26.0 - 34.0 pg   MCHC 33.3 30.0 - 36.0 g/dL   RDW 12.4 11.5 - 15.5 %   Platelets 196 150 - 400 K/uL   nRBC 0.0 0.0 - 0.2 %    Comment: Performed at [facility], [place] [address] [place], [place] [address]  Troponin I - Once     Status: Abnormal   Collection Time: 02/17/19  6:55 AM  Result Value Ref Range   Troponin I  0.03 (HH) <0.03 ng/mL    Comment: CRITICAL RESULT CALLED TO, READ BACK BY AND VERIFIED WITH: H.LEE,RN @ 0805 02/17/2019 [place] Performed at [facility], [place] [address], [place], [place] [address]   T4, free     Status: Abnormal   Collection Time: 02/17/19  2:38 PM  Result Value Ref Range   Free T4 0.78 (L) 0.82 - 1.77 ng/dL    Comment: (NOTE) Biotin ingestion may interfere with free T4 tests. If the results are inconsistent with the TSH level, previous test results, or the clinical presentation, then consider biotin interference. If needed, order repeat testing after stopping biotin. Performed at [facility], [place] [address] [place], [place] [address]   C-reactive protein     Status: Abnormal   Collection Time: 02/17/19  2:38 PM  Result Value Ref Range   CRP 1.1 (H) <1.0 mg/dL    Comment: Performed at [facility], [place] [address] [place], [place] [address]  Comprehensive metabolic panel     Status: Abnormal   Collection Time: 02/17/19  2:38 PM  Result Value Ref Range   Sodium 138 135 - 145 mmol/L   Potassium 3.9 3.5 - 5.1 mmol/L   Chloride 103 98 - 111 mmol/L   CO2 27 22 - 32 mmol/L   Glucose, Bld 95 70 - 99 mg/dL   BUN 10 8 - 23 mg/dL   Creatinine, Ser 0.64 0.61 - 1.24 mg/dL   Calcium 8.9 8.9 - 10.3 mg/dL   Total Protein 6.4 (L) 6.5 - 8.1 g/dL   Albumin 3.4 (L) 3.5 - 5.0 g/dL   AST 490 (H) 15 - 41 U/L   ALT 493 (H) 0 - 44 U/L   Alkaline Phosphatase 57 38 - 126 U/L   Total Bilirubin 0.4 0.3 - 1.2 mg/dL   GFR calc non Af Amer >60 >60 mL/min   GFR calc Af Amer >60 >60 mL/min   Anion gap 8 5 - 15    Comment: Performed at [facility] [address] [place], [place] [address]  CK     Status: Abnormal   Collection Time: 02/17/19  2:38 PM  Result Value Ref Range   Total CK 16,916 (H) 49 - 397 U/L    Comment: RESULTS CONFIRMED BY MANUAL DILUTION Performed at [facility], [place] [address], [place], [place] [address]   Sedimentation rate      Status: None   Collection Time: 02/18/19 12:42 AM  Result Value Ref Range   Sed Rate 12 0 - 16 mm/hr    Comment: Performed at [facility] [address] [place],  [address]  CK     Status: Abnormal  Collection Time: 02/18/19 12:42 AM  Result Value Ref Range   Total CK 16,393 (H) 49 - 397 U/L    Comment: RESULTS CONFIRMED BY MANUAL DILUTION Performed at [facility], [place] [address], [place], [place] [address]   Comprehensive metabolic panel     Status: Abnormal   Collection Time: 02/18/19 12:42 AM  Result Value Ref Range   Sodium 135 135 - 145 mmol/L   Potassium 3.8 3.5 - 5.1 mmol/L   Chloride 104 98 - 111 mmol/L   CO2 25 22 - 32 mmol/L   Glucose, Bld 102 (H) 70 - 99 mg/dL   BUN 9 8 - 23 mg/dL   Creatinine, Ser 0.65 0.61 - 1.24 mg/dL   Calcium 8.8 (L) 8.9 - 10.3 mg/dL   Total Protein 6.0 (L) 6.5 - 8.1 g/dL   Albumin 3.2 (L) 3.5 - 5.0 g/dL   AST 454 (H) 15 - 41 U/L   ALT 466 (H) 0 - 44 U/L   Alkaline Phosphatase 60 38 - 126 U/L   Total Bilirubin 0.5 0.3 - 1.2 mg/dL   GFR calc non Af Amer >60 >60 mL/min   GFR calc Af Amer >60 >60 mL/min   Anion gap 6 5 - 15    Comment: Performed at [facility], [place] [address], [place], [place] [address]  CBC     Status: Abnormal   Collection Time: 02/18/19 12:42 AM  Result Value Ref Range   WBC 3.6 (L) 4.0 - 10.5 K/uL   RBC 4.38 4.22 - 5.81 MIL/uL   Hemoglobin 13.4 13.0 - 17.0 g/dL   HCT 40.0 39.0 - 52.0 %   MCV 91.3 80.0 - 100.0 fL   MCH 30.6 26.0 - 34.0 pg   MCHC 33.5 30.0 - 36.0 g/dL   RDW 12.2 11.5 - 15.5 %   Platelets 202 150 - 400 K/uL   nRBC 0.0 0.0 - 0.2 %    Comment: Performed at [facility], [place] [address] [place],  [address]    Recent Results (from the past 240 hour(s))  SARS Coronavirus 2 (CEPHEID - Performed in [facility] hospital lab), Hosp Order     Status: None   Collection Time: 02/16/19  4:21 PM  Result Value Ref Range Status   SARS Coronavirus 2 NEGATIVE NEGATIVE Final     Comment: (NOTE) If result is NEGATIVE SARS-CoV-2 target nucleic acids are NOT DETECTED. The SARS-CoV-2 RNA is generally detectable in upper and lower  respiratory specimens during the acute phase of infection. The lowest  concentration of SARS-CoV-2 viral copies this assay can detect is 250  copies / mL. A negative result does not preclude SARS-CoV-2 infection  and should not be used as the sole basis for treatment or other  patient management decisions.  A negative result may occur with  improper specimen collection / handling, submission of specimen other  than nasopharyngeal swab, presence of viral mutation(s) within the  areas targeted by this assay, and inadequate number of viral copies  (<250 copies / mL). A negative result must be combined with clinical  observations, patient history, and epidemiological information. If result is POSITIVE SARS-CoV-2 target nucleic acids are DETECTED. The SARS-CoV-2 RNA is generally detectable in upper and lower  respiratory specimens dur ing the acute phase of infection.  Positive  results are indicative of active infection with SARS-CoV-2.  Clinical  correlation with patient history and other diagnostic information is  necessary to determine patient infection  status.  Positive results do  not rule out bacterial infection or co-infection with other viruses. If result is PRESUMPTIVE POSTIVE SARS-CoV-2 nucleic acids MAY BE PRESENT.   A presumptive positive result was obtained on the submitted specimen  and confirmed on repeat testing.  While 2019 novel coronavirus  (SARS-CoV-2) nucleic acids may be present in the submitted sample  additional confirmatory testing may be necessary for epidemiological  and / or clinical management purposes  to differentiate between  SARS-CoV-2 and other Sarbecovirus currently known to infect humans.  If clinically indicated additional testing with an alternate test  methodology [phone]) is advised. The  SARS-CoV-2 RNA is generally  detectable in upper and lower respiratory sp ecimens during the acute  phase of infection. The expected result is Negative. Fact Sheet for Patients:  StrictlyIdeas.no Fact Sheet for Healthcare Providers: BankingDealers.co.za This test is not yet approved or cleared by the [place] FDA and has been authorized for detection and/or diagnosis of SARS-CoV-2 by FDA under an Emergency Use Authorization (EUA).  This EUA will remain in effect (meaning this test can be used) for the duration of the COVID-19 declaration under Section 564(b)(1) of the Act, 21 U.S.C. section 360bbb-3(b)(1), unless the authorization is terminated or revoked sooner. Performed at [facility], [place] [address], [place], [place] [address]     Lipid Panel Recent Labs    02/15/19 1203  CHOL 164  TRIG 144.0  HDL 33.30*  CHOLHDL 5  VLDL 28.8  LDLCALC 102*    Studies/Results: Ct Chest W Contrast  Result Date: 02/17/2019 CLINICAL DATA:  Generalized muscle weakness, evaluate for malignant etiology EXAM: CT CHEST, ABDOMEN, AND PELVIS WITH CONTRAST TECHNIQUE: Multidetector CT imaging of the chest, abdomen and pelvis was performed following the standard protocol during bolus administration of intravenous contrast. CONTRAST:  153m OMNIPAQUE IOHEXOL 300 MG/ML  SOLN COMPARISON:  CT abdomen pelvis, 03/03/2018, CT chest, 10/12/2014 FINDINGS: CT CHEST FINDINGS Cardiovascular: Extensive 3 vessel coronary artery calcifications and/or stents. Normal heart size. No pericardial effusion. Mediastinum/Nodes: No enlarged mediastinal, hilar, or axillary lymph nodes. Thyroid gland, trachea, and esophagus demonstrate no significant findings. Lungs/Pleura: Multiple small benign calcified pulmonary nodules present bilaterally. Innumerable tiny centrilobular pulmonary nodules in an apically predominant distribution, which may reflect sequelae of atypical  infection or smoking-related respiratory bronchiolitis. There is an unchanged, benign somewhat bandlike consolidation in the right lung base. No pleural effusion or pneumothorax. Musculoskeletal: No chest wall mass or suspicious bone lesions identified. CT ABDOMEN PELVIS FINDINGS Hepatobiliary: No focal liver abnormality is seen. No gallstones, gallbladder wall thickening, or biliary dilatation. Pancreas: Unremarkable. No pancreatic ductal dilatation or surrounding inflammatory changes. Spleen: Normal in size without focal abnormality. Adrenals/Urinary Tract: Adrenal glands are unremarkable. Kidneys are normal, without renal calculi, focal lesion, or hydronephrosis. Bladder is unremarkable. Stomach/Bowel: Stomach is within normal limits. Appendix appears normal. No evidence of bowel wall thickening, distention, or inflammatory changes. Vascular/Lymphatic: Severe mixed calcific atherosclerosis of the abdominal aorta and branch vessels. No enlarged abdominal or pelvic lymph nodes. Reproductive: Prostatomegaly. Other: No abdominal wall hernia or abnormality. No abdominopelvic ascites. Musculoskeletal: No acute or significant osseous findings. IMPRESSION: 1.  No CT evidence of malignancy in the chest, abdomen, or pelvis. 2. Innumerable tiny centrilobular pulmonary nodules in an apically predominant distribution, which may reflect sequelae of atypical infection or smoking-related respiratory bronchiolitis. There is an unchanged, benign somewhat bandlike consolidation in the right lung base. 3.  Coronary artery disease. 4.  Atherosclerosis. 5.  Prostatomegaly. Electronically Signed  By: Eddie Candle M.D.   On: 02/17/2019 16:25   Mr Cervical Spine Wo Contrast  Result Date: 02/17/2019 CLINICAL DATA:  Myelopathy. Progressive lower extremity weakness. Multiple falls. 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 EXAM: MRI CERVICAL SPINE WITHOUT CONTRAST TECHNIQUE: Multiplanar, multisequence MR  imaging of the cervical spine was performed. No intravenous contrast was administered. COMPARISON:  None. FINDINGS: Alignment: Minimal retrolisthesis of C3 on C4. Vertebrae: C4-5 and C5-6 interbody osseous fusion with anterior fusion plate and screws in place at C4-5. No fracture, suspicious osseous lesion, or significant marrow edema. Chronic degenerative endplate changes at S2-8 greater than C3-4 associated with disc space narrowing. Cord: Well-defined T2 hyperintensity in the cord bilaterally at C4 and C5 with mild cord volume loss more notable at C4 consistent with myelomalacia. Posterior Fossa, vertebral arteries, paraspinal tissues: Dominant appearing left vertebral artery with preserved flow void. Poor visualization of the right vertebral artery which is likely hypoplastic. Unremarkable included portion of the posterior fossa. Disc levels: C2-3: Minimal disc bulging and moderate left facet arthrosis without stenosis. C3-4: Disc bulging and uncovertebral spurring result in mild-to-moderate spinal stenosis and moderate right and mild-to-moderate left neural foraminal stenosis. C4-5: Anterior fusion.  No significant stenosis. C5-6: Anterior fusion. Right-sided osteophytic ridging results in mild right neural foraminal stenosis. Patent spinal canal and left neural foramen. C6-7: Broad-based posterior disc osteophyte complex results in moderate spinal stenosis and moderate right and mild left neural foraminal stenosis. C7-T1: Negative. IMPRESSION: 1. 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 2. Solid anterior fusion at C4-5 and C5-6 without spinal stenosis at these levels. 3. Adjacent segment degenerative changes with moderate spinal stenosis at C6-7, mild-to-moderate spinal stenosis at C3-4, and mild-to-moderate neural foraminal stenosis at both levels. Electronically Signed   By: Logan Bores M.D.   On: 02/17/2019 12:17   Mr Thoracic Spine Wo Contrast  Result Date: 02/17/2019 CLINICAL DATA:  Myelopathy. Progressive lower  extremity weakness. Multiple falls. 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 EXAM: MRI THORACIC SPINE WITHOUT CONTRAST TECHNIQUE: Multiplanar, multisequence MR imaging of the thoracic spine was performed. No intravenous contrast was administered. COMPARISON:  Chest CT 10/31/2015 FINDINGS: Alignment:  Normal. Vertebrae: No fracture, suspicious osseous lesion, or significant marrow edema. Small hemangioma in the T8 vertebral body. Cord: Slight prominence of the central canal of the spinal cord in the mid to lower thoracic spine most notably at T9-10. No frank syrinx formation. Normal cord signal elsewhere. Paraspinal and other soft tissues: Partially visualized 1.4 cm T2 hyperintense lesion in the upper pole of the right kidney as well as a subcentimeter T2 hyperintensity in the upper pole of the left kidney, likely cysts but incompletely evaluated. Trace bilateral pleural fluid. Disc levels: Mild disc bulging at T1-2, T2-3, and T5-6. Tiny left paracentral disc protrusions at T6-7 and T7-8. No spinal stenosis or cord compression. Mild facet arthrosis in the upper and lower thoracic spine without evidence of compressive neural foraminal stenosis. IMPRESSION: 1. Mild thoracic disc and facet degeneration without evidence of neural impingement. 2. Minimal prominence of the central canal of the mid to lower thoracic spinal cord without syrinx. Electronically Signed   By: Logan Bores M.D.   On: 02/17/2019 12:36   Mr Lumbar Spine Wo Contrast  Result Date: 02/17/2019 CLINICAL DATA:  Myelopathy. Progressive lower extremity weakness. Multiple falls. 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 EXAM: MRI LUMBAR SPINE WITHOUT CONTRAST TECHNIQUE: Multiplanar, multisequence MR imaging of the lumbar spine was performed. No intravenous  contrast was administered. COMPARISON:  Lumbar spine radiographs 02/15/2019. CT abdomen and pelvis 03/03/2018. FINDINGS: Segmentation:  Standard.  Alignment:  Normal. Vertebrae: No fracture or suspicious osseous lesion. Moderate degenerative endplate changes at D7-O2, predominantly Modic type 2. Conus medullaris and cauda equina: Conus extends to the T12-L1 level. Conus and cauda equina appear normal. Paraspinal and other soft tissues: Unremarkable. Disc levels: Disc desiccation throughout the lumbar spine with exception of L1-2. Moderate to severe disc space narrowing at L5-S1 with milder narrowing at L2-3, L3-4, and L4-5. L1-2: Normal disc.  Slight facet hypertrophy without stenosis. L2-3: Circumferential disc bulging and mild facet and ligamentum flavum hypertrophy without significant stenosis. L3-4: Circumferential disc bulging, a small right foraminal to extraforaminal disc protrusion, and mild facet and ligamentum flavum hypertrophy result in borderline lateral recess stenosis and mild bilateral neural foraminal stenosis with the disc protrusion potentially affecting the exiting right L3 nerve root. No significant spinal stenosis. L4-5: Circumferential disc bulging, a right foraminal to extraforaminal disc osteophyte complex, and mild facet and ligamentum flavum hypertrophy result in mild right neural foraminal stenosis with the disc osteophyte complex potentially affecting the right L4 nerve lateral to the foramen. No significant spinal stenosis. L5-S1: Prior left laminectomy. Broad central disc protrusion, disc bulging asymmetric to the left, and mild facet hypertrophy result in mild right and moderate left neural foraminal stenosis without spinal stenosis. There is likely some epidural fibrosis in the left lateral recess. IMPRESSION: 1. Relatively diffuse lumbar disc and facet degeneration without significant spinal stenosis. 2. Multilevel neural foraminal stenosis, moderate on the left at L5-S1. 3. Normal appearance of the conus medullaris and cauda equina. Electronically Signed   By: Logan Bores M.D.   On: 02/17/2019 12:28   Ct Abdomen Pelvis W  Contrast  Result Date: 02/17/2019 CLINICAL DATA:  Generalized muscle weakness, evaluate for malignant etiology EXAM: CT CHEST, ABDOMEN, AND PELVIS WITH CONTRAST TECHNIQUE: Multidetector CT imaging of the chest, abdomen and pelvis was performed following the standard protocol during bolus administration of intravenous contrast. CONTRAST:  158m OMNIPAQUE IOHEXOL 300 MG/ML  SOLN COMPARISON:  CT abdomen pelvis, 03/03/2018, CT chest, 10/12/2014 FINDINGS: CT CHEST FINDINGS Cardiovascular: Extensive 3 vessel coronary artery calcifications and/or stents. Normal heart size. No pericardial effusion. Mediastinum/Nodes: No enlarged mediastinal, hilar, or axillary lymph nodes. Thyroid gland, trachea, and esophagus demonstrate no significant findings. Lungs/Pleura: Multiple small benign calcified pulmonary nodules present bilaterally. Innumerable tiny centrilobular pulmonary nodules in an apically predominant distribution, which may reflect sequelae of atypical infection or smoking-related respiratory bronchiolitis. There is an unchanged, benign somewhat bandlike consolidation in the right lung base. No pleural effusion or pneumothorax. Musculoskeletal: No chest wall mass or suspicious bone lesions identified. CT ABDOMEN PELVIS FINDINGS Hepatobiliary: No focal liver abnormality is seen. No gallstones, gallbladder wall thickening, or biliary dilatation. Pancreas: Unremarkable. No pancreatic ductal dilatation or surrounding inflammatory changes. Spleen: Normal in size without focal abnormality. Adrenals/Urinary Tract: Adrenal glands are unremarkable. Kidneys are normal, without renal calculi, focal lesion, or hydronephrosis. Bladder is unremarkable. Stomach/Bowel: Stomach is within normal limits. Appendix appears normal. No evidence of bowel wall thickening, distention, or inflammatory changes. Vascular/Lymphatic: Severe mixed calcific atherosclerosis of the abdominal aorta and branch vessels. No enlarged abdominal or pelvic  lymph nodes. Reproductive: Prostatomegaly. Other: No abdominal wall hernia or abnormality. No abdominopelvic ascites. Musculoskeletal: No acute or significant osseous findings. IMPRESSION: 1.  No CT evidence of malignancy in the chest, abdomen, or pelvis. 2. Innumerable tiny centrilobular pulmonary nodules in an apically predominant distribution, which may  reflect sequelae of atypical infection or smoking-related respiratory bronchiolitis. There is an unchanged, benign somewhat bandlike consolidation in the right lung base. 3.  Coronary artery disease. 4.  Atherosclerosis. 5.  Prostatomegaly. Electronically Signed   By: Eddie Candle M.D.   On: 02/17/2019 16:25   Dg Chest Port 1v Same Day  Result Date: 02/17/2019 CLINICAL DATA:  Dyspnea. EXAM: PORTABLE CHEST 1 VIEW COMPARISON:  CT scan of October 31, 2015. FINDINGS: The heart size and mediastinal contours are within normal limits. Both lungs are clear. No pneumothorax or pleural effusion is noted. The visualized skeletal structures are unremarkable. IMPRESSION: No active disease. Electronically Signed   By: Marijo Conception M.D.   On: 02/17/2019 09:06    Medications:  Prior to Admission:  Medications Prior to Admission  Medication Sig Dispense Refill Last Dose  . acetaminophen (TYLENOL) 500 MG tablet Take 1,000 mg by mouth every 6 (six) hours as needed for headache (pain).   week ago  . Alirocumab (PRALUENT) 150 MG/ML SOPN Inject 150 mg as directed every 14 (fourteen) days.    1 1/2 weeks ago  . amoxicillin (AMOXIL) 500 MG capsule Take 2,000 mg by mouth See admin instructions. Take 4 capsules (2000 mg) by mouth one hours prior to dental appointments   Dec 2019?  Marland Kitchen aspirin 81 MG chewable tablet Chew 1 tablet (81 mg total) by mouth daily. (Patient taking differently: Chew 81 mg by mouth at bedtime. ) 30 tablet 0 02/15/2019 at pm  . finasteride (PROSCAR) 5 MG tablet Take 5 mg by mouth at bedtime.    02/15/2019 at pm  . fluticasone (FLONASE) 50 MCG/ACT nasal  spray Place 2 sprays into both nostrils daily. (Patient taking differently: Place 2 sprays into both nostrils daily as needed for allergies or rhinitis. ) 16 g 3 week ago  . nitroGLYCERIN (NITROSTAT) 0.4 MG SL tablet Place 1 tablet (0.4 mg total) under the tongue every 5 (five) minutes as needed for chest pain. 25 tablet 4 never taken  . Omega-3 Fatty Acids (FISH OIL PO) Take 2 capsules by mouth at bedtime.    02/15/2019 at pm  . Polyvinyl Alcohol-Povidone (REFRESH OP) Place 1 drop into both eyes daily.   02/16/2019 at am  . tamsulosin (FLOMAX) 0.4 MG CAPS capsule TAKE 1 CAPSULE BY MOUTH ONCE DAILY WITH SUPPER (Patient taking differently: Take 0.4 mg by mouth at bedtime. ) 90 capsule 3 02/15/2019 at pm   Scheduled: . aspirin  81 mg Oral QHS  . docusate sodium  100 mg Oral BID  . enoxaparin (LOVENOX) injection  40 mg Subcutaneous Q24H  . finasteride  5 mg Oral QHS  . tamsulosin  0.4 mg Oral Q supper   Continuous: . sodium chloride 150 mL/hr at 02/18/19 0459    Assessment:  68 y.o. 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 on 5/8 with c/o progressively worsening proximal lower extremity muscle weakness as well as abdominal muscle weakness for the past 4-6 weeks. 1. At baseline, he does have some right > left sided upper and lower extremity weakness from his prior C-spine injury. 2. On my examination as well as Dr. Johny Chess exam yesterday, there more proximal weakness than distal weakness. The exam is symmetric when it comes to right left comparison. There is particular concern about the proximal hip weakness with nearly normal knee and ankle strength bilaterally. 3. This could all be from  the rhabdomyolysis but given history of unintentional weight loss, this could also represent Lambert-Eaton myasthenic syndrome.  Inflammatory myopathies are to be kept in the DDx as well. 4. 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 The hyperreflexia is most likely residual. Dr. Rory Percy and I are in consensus that Guillain-Barr syndrome is unlikely. 5. No rash consistent with dermatomyositis is seen. A few raised macules are seen, which the patient states are fading. The macules started at the same time as the onset of his progressive weakness (compared to his chronically weak baseline from the spinal cord injury), which continues to raise the question of an autoimmune phenomenon.  6. CK increased from 14 K on 5/7 to 16 K and then 17.6 K late last night.Now at 16K Redrawing level qd.  7. ESR was normal at 18. Now at 12 Does not rule out a polymyositis as ESR is normal in 50% of cases. To evaluate for polymyositis, muscle biopsy, EMG (can only be done as outpatient here in [place]) and MRI of thigh will be useful. C-reactive protein is 1.1 8. TSH was mildly elevated at 4.86. Not felt to be high enough to be a hypothyroid myopathy. T4 level ordered and is 0.78 slightly low. May need endocrinology consult.  9. Also on the DDx is a tick borne infection resulting in rhabdomyolysis. This is a consideration as the individual macules of the "rash" resemble the size and morphology of a typical tick bite - the residual inflammation from such could persist for several weeks, especially if associated with local infection. Ehrlichiosis, RMSF and Lyme disease, all of which are transmitted by ticks, can be associated with a myositis/rhabdomyolysis. All are send out labs and results are pending.  10.  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 This finding is chronic and accounts for a component of his weakness but does not explain the rhabdomyolysis with new onset of weakness over the past 4-6 weeks.   Recommendations: -- Muscle biopsy is scheduled for Monday. The biopsy should be performed on his quadriceps, which is the most accessible of the newly weakened muscle groups.  --  Should hold off on empiric IV methylprednisolone until after the biopsy to reduce the chance of confounding the pathology assessment of the muscle biopsy to be performed on Monday. -- CK level to be tracked qd  --  May need endocrinology consult for elevated TSH in conjunction with slightly low T4.  -- PT/OT --Aggressive hydration with IV fluids for the rhabdomyolysis -- Holding Praluent for now. Although it is being held, this medication is unlikely to have precipitated his myopathy as, per patient, he has been on this medication for 6-12 months and onset of myopathic symptoms is recent.  -- Outpatient EMG nerve conduction studies-Dr. Narda Amber at [facility] neurology -- Due to the concern for weight loss, a pan scan to rule out malignancy (specifically small cell lung cancer that has most common association with Lambert-Eaton.) is recommended. Will defer to primary team -- Paraneoplastic/polymyositis panel has been ordered as a send out to [place]. In order I have specified that a focused panel for myopathy/polymyositis should be run. -Obtain MRI of thigh (ordered) to assess for specific pattern of inflammation of muscle groups, which can aid in narrowing the DDx for some myopathies.   Laurey Morale, MSN, NP-C [facility] [phone]  Electronically signed: Dr. Kerney Elbe  LOS: 2 days

## 2019-02-19 ENCOUNTER — Encounter (HOSPITAL_COMMUNITY): Payer: Self-pay | Admitting: Registered Nurse

## 2019-02-19 LAB — COMPREHENSIVE METABOLIC PANEL
ALT: 445 U/L — ABNORMAL HIGH (ref 0–44)
AST: 394 U/L — ABNORMAL HIGH (ref 15–41)
Albumin: 3.1 g/dL — ABNORMAL LOW (ref 3.5–5.0)
Alkaline Phosphatase: 59 U/L (ref 38–126)
Anion gap: 10 (ref 5–15)
BUN: 8 mg/dL (ref 8–23)
CO2: 28 mmol/L (ref 22–32)
Calcium: 8.9 mg/dL (ref 8.9–10.3)
Chloride: 102 mmol/L (ref 98–111)
Creatinine, Ser: 0.63 mg/dL (ref 0.61–1.24)
GFR calc Af Amer: >60 mL/min (ref >60)
GFR calc non Af Amer: >60 mL/min (ref >60)
Glucose, Bld: 100 mg/dL — ABNORMAL HIGH (ref 70–99)
Potassium: 4 mmol/L (ref 3.5–5.1)
Sodium: 140 mmol/L (ref 135–145)
Total Bilirubin: 0.6 mg/dL (ref 0.3–1.2)
Total Protein: 6 g/dL — ABNORMAL LOW (ref 6.5–8.1)

## 2019-02-19 LAB — HEPATITIS PANEL, ACUTE
HCV Ab: <0.1 s/co ratio (ref 0.0–0.9)
Hep A IgM: NEGATIVE
Hep B C IgM: NEGATIVE
Hepatitis B Surface Ag: NEGATIVE

## 2019-02-19 LAB — SURGICAL PCR SCREEN
MRSA, PCR: NEGATIVE
Staphylococcus aureus: POSITIVE — AB

## 2019-02-19 LAB — CK
Total CK: 12702 U/L — ABNORMAL HIGH (ref 49–397)
Total CK: 12575 U/L — ABNORMAL HIGH (ref 49–397)

## 2019-02-19 LAB — ANTINUCLEAR ANTIBODIES, IFA: ANA Ab, IFA: NEGATIVE

## 2019-02-19 LAB — CBC
HCT: 40.3 % (ref 39.0–52.0)
Hemoglobin: 13.5 g/dL (ref 13.0–17.0)
MCH: 30.3 pg (ref 26.0–34.0)
MCHC: 33.5 g/dL (ref 30.0–36.0)
MCV: 90.6 fL (ref 80.0–100.0)
Platelets: 186 10*3/uL (ref 150–400)
RBC: 4.45 MIL/uL (ref 4.22–5.81)
RDW: 12.3 % (ref 11.5–15.5)
WBC: 4.9 10*3/uL (ref 4.0–10.5)
nRBC: 0 % (ref 0.0–0.2)

## 2019-02-19 LAB — EHRLICHIA ANTIBODY PANEL
E chaffeensis (HGE) Ab, IgG: NEGATIVE
E chaffeensis (HGE) Ab, IgM: NEGATIVE
E. Chaffeensis (HME) IgM Titer: NEGATIVE
E.Chaffeensis (HME) IgG: NEGATIVE

## 2019-02-19 LAB — ANTI-SCLERODERMA ANTIBODY: Scleroderma (Scl-70) (ENA) Antibody, IgG: <0.2 AI (ref 0.0–0.9)

## 2019-02-19 LAB — ANTI-JO 1 ANTIBODY, IGG: Anti JO-1: <0.2 AI (ref 0.0–0.9)

## 2019-02-19 LAB — MYOGLOBIN, SERUM: Myoglobin: 4965 ng/mL — ABNORMAL HIGH (ref 28–72)

## 2019-02-19 LAB — B. BURGDORFI ANTIBODIES: B burgdorferi Ab IgG+IgM: <0.91 {ISR} (ref 0.00–0.90)

## 2019-02-19 MED ORDER — PANTOPRAZOLE SODIUM 40 MG PO TBEC
40.0000 mg | DELAYED_RELEASE_TABLET | Freq: Every day | ORAL | Status: DC
  Administered 2019-02-19 – 2019-02-20 (×2): 40 mg via ORAL
  Filled 2019-02-19 (×2): qty 1

## 2019-02-19 MED ORDER — PREDNISONE 50 MG PO TABS
80.0000 mg | ORAL_TABLET | Freq: Every day | ORAL | Status: DC
  Administered 2019-02-19 – 2019-02-20 (×2): 80 mg via ORAL
  Filled 2019-02-19 (×3): qty 1

## 2019-02-19 MED ORDER — PREDNISONE 50 MG PO TABS: 80.0000 mg | ORAL_TABLET | Freq: Every day | ORAL | Status: DC

## 2019-02-19 NOTE — Progress Notes (Signed)
Subjective: No significant changes.  He denies myalgias or muscle pains, stating that this is been simply progressive weakness.  Exam: Vitals:   02/18/19 2043 02/19/19 0425  BP: 136/66 (!) 114/58  Pulse: (!) 53 (!) 50  Resp: 18 16  Temp: 97.9 F (36.6 C) (!) 97.3 F (36.3 C)  SpO2: 99% 97%   Gen: In bed, NAD Resp: non-labored breathing, no acute distress Abd: soft, nt  Neuro: MS: Awake, alert, interactive and appropriate, oriented x3 CN: EOMI, visual fields full, face symmetric Motor: He has mild proximal greater than distal weakness, with right side being slightly weaker than the left Sensory: Decreased to light touch in the right side (baseline since previous injury) DTR: 2+ and symmetric in the biceps and patella  Pertinent Labs: CRP borderline at 1.1 ESR normal at 12 ANA, anti-Jo 1, anti-scleroderma pending Voltage-gated calcium channel antibody pending  Impression: 68 year old male with progressive proximal greater than distal muscle weakness over the past 4 weeks with 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  With preserved reflexes, I think that Lambert-Eaton or Guillian-Barre (things that have been mentioned earlier in the hospitalization) are much less likely.  The rash she has had is not the characteristic rash of dermatomyositis, but I think that steroid therapy would be prudent at this time.  Recommendations: 1) prednisone 1 mg/kg/day(80 mg daily) 2) muscle biopsy, I have discussed with the surgeon trying to get muscle that is more involved based on MRI results.  This could be rectus femoris of the quadricep, though other muscles could be biopsied besides the quadriceps  3) PT  Roland Rack, MD [facility] [phone]  If 7pm- 7am, please page neurology on call as listed in [place].

## 2019-02-19 NOTE — Progress Notes (Signed)
[facility]                                                                              Patient Demographics  Stanley Boyd, is a 68 y.o. male, DOB - 15-Aug-1951, IAX:[id number]  Admit date - 02/16/2019   Admitting Physician Karmen Bongo, MD  Outpatient Primary MD for the patient is [name], MD  Outpatient specialists:   LOS - 3  days   Medical records reviewed and are as summarized below:    Chief Complaint  Patient presents with   Dehydration       Brief summary   Patient is a 68 year old male with CAD, hyperlipidemia presented with increasing weakness.  Patient had not been able to lift his legs, feeling increasingly weak, hard to ambulate.  Patient saw his PCP, CK level was elevated and was recommended hospitalization and further work-up.  Patient reported symptoms started about a month ago and worse in the last 2 weeks starts from the calves down.  Patient ports unable to sit up without assistance and has progressed.  No worsening upper extremity weakness.  Denied any numbness, tingling, respiratory compromise.  He has increasing falls over the past 4 to 5 weeks, on injectable alirocumab for last 1 year.  He also reported some weight loss over the past few weeks to months, unintentional but attributed to decreased appetite and not hydrating himself well.  He has rash on his chest and back for the past 4 to 6 weeks CK elevated 14,000 at PCPs office, CK 16,516 in ED. initially concern for (syndrome, patient was admitted for further work-up. COVID-19 negative  Assessment & Plan    Principal Problem: Progressively worsening proximal muscle weakness with rhabdomyolysis -At baseline has some right-sided weakness from his prior C-spine injury -CK level stable and not trending up, for now continue IV fluid hydration, likely start on steroids after the biopsy done tomorrow -Work-up in progress, rash not consistent with dermatomyositis.  ESR normal, CRP 1.1.  Aldolase elevated at 17. -  ANA, anti-Jo antibody, anti-scleroderma antibodies pending - RMSF, Lyme and Ehrlichia antibody panel, paraneoplastic panel in process - Praluent currently on hold, per neurology unlikely to have caused myopathy as patient has been on it for almost a year.   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  Repeat thyroid studies in 4 weeks -MRI of the left thigh shows diffuse muscular edema, wide differential, possibly myositis, inflammatory myopathies, vasculitis associated myopathy, rhabdomyolysis, acute or subacute autoimmune enteropathy. -Muscle and skin biopsy was canceled due to emergent surgeries in the OR.  Discussed with surgery, they will schedule for tomorrow morning. -Cussed with neurology, Dr. Leonel Ramsay, starting steroids today, prednisone 1 mg/KG/day  Active Problems:  Hyperlipidemia -Praluent currently on hold  Acute transaminitis -LFTs mildly trending down, acute hepatitis panel pending  -Likely due to acute rhabdo    BPH (benign prostatic hyperplasia) -Continue Flomax   History of CAD with PCI in 06/2015 -Cardiology following, currently no acute chest pain or shortness of breath   Code Status: Full code DVT Prophylaxis:  Lovenox  Family Communication: Discussed in detail with the patient, all imaging results, lab results explained to the patient and patient's son, Dr. Lacinda Axon on the phone   Disposition Plan: Inpatient work-up in progress, needs skin and muscle biopsy in  a.m.  If improving and able to ambulate with PT, may be DC home with home health on steroids, follow biopsy results and work-up with his neurologist, Dr. Posey Pronto.  Time Spent in minutes 25 minutes  Procedures:  None  Consultants:   Neurology Cardiology General surgery  Antimicrobials:   Anti-infectives (From admission, onward)   Start     Dose/Rate Route Frequency Ordered Stop   02/19/19 0600  ceFAZolin (ANCEF) IVPB 2g/100 mL premix     2 g 200 mL/hr over 30 Minutes Intravenous On call to O.R. 02/18/19 1902 02/20/19 0559         Medications  Scheduled Meds:  aspirin  81 mg Oral QHS   docusate sodium  100 mg Oral BID   enoxaparin (LOVENOX) injection  40 mg Subcutaneous Q24H   finasteride  5 mg Oral QHS   levothyroxine  25 mcg Oral Q0600   pantoprazole  40 mg Oral Daily   predniSONE  80 mg Oral Q breakfast   tamsulosin  0.4 mg Oral Q supper   Continuous Infusions:  sodium chloride 150 mL/hr at 02/19/19 0954    ceFAZolin (ANCEF) IV     PRN Meds:.morphine injection, ondansetron **OR** ondansetron (ZOFRAN) IV      Subjective:   [name] was seen and examined today.  Denies any specific complaints, no muscle pains, has not ambulated.  Hoping to get the muscle and skin biopsy soon.  Afebrile.  No chest pain or shortness of breath.  Patient denies dizziness, abdominal pain, N/V/D/C.  No numbness or tingling.   Objective:   Vitals:   02/18/19 2043 02/19/19 0425 02/19/19 0431 02/19/19 1315  BP: 136/66 (!) 114/58  129/72  Pulse: (!) 53 (!) 50  (!) 46  Resp: '18 16  18  '$ Temp: 97.9 F (36.6 C) (!) 97.3 F (36.3 C)  (!) 97.5 F (36.4 C)  TempSrc: Oral Oral  Oral  SpO2: 99% 97%  97%  Weight:   84.4 kg   Height:        Intake/Output Summary (Last 24 hours) at 02/19/2019 1322 Last data filed at 02/19/2019 1317 Gross per 24 hour  Intake 2810.46 ml  Output 4000 ml  Net -1189.54 ml     Wt Readings from Last 3 Encounters:  02/19/19 84.4 kg  02/15/19  81.5 kg  02/06/18 85.5 kg   Physical Exam  General: Alert and oriented x 3, NAD  Eyes:   HEENT:  Atraumatic, normocephalic  Cardiovascular: S1 S2 clear, no murmurs, RRR. No pedal edema b/l  Respiratory: CTAB, no wheezing, rales or rhonchi  Gastrointestinal: Soft, nontender, nondistended, NBS  Ext: no pedal edema bilaterally  Neuro: Proximal muscle weakness, RT >LEFT  Musculoskeletal: No cyanosis, clubbing  Skin: No rashes  Psych: Normal affect and demeanor, alert and oriented x3   Data Reviewed:  I have personally reviewed following labs and imaging studies  Micro Results  Recent Results (from the past 240 hour(s))  SARS Coronavirus 2 (CEPHEID - Performed in [place] hospital lab), Hosp Order     Status: None   Collection Time: 02/16/19  4:21 PM  Result Value Ref Range Status   SARS Coronavirus 2 NEGATIVE NEGATIVE Final    Comment: (NOTE) If result is NEGATIVE SARS-CoV-2 target nucleic acids are NOT DETECTED. The SARS-CoV-2 RNA is generally detectable in upper and lower  respiratory specimens during the acute phase of infection. The lowest  concentration of SARS-CoV-2 viral copies this assay can detect is 250  copies / mL. A negative result does not preclude SARS-CoV-2 infection  and should not be used as the sole basis for treatment or other  patient management decisions.  A negative result may occur with  improper specimen collection / handling, submission of specimen other  than nasopharyngeal swab, presence of viral mutation(s) within the  areas targeted by this assay, and inadequate number of viral copies  (<250 copies / mL). A negative result must be combined with clinical  observations, patient history, and epidemiological information. If result is POSITIVE SARS-CoV-2 target nucleic acids are DETECTED. The SARS-CoV-2 RNA is generally detectable in upper and lower  respiratory specimens dur ing the acute phase of infection.  Positive  results are  indicative of active infection with SARS-CoV-2.  Clinical  correlation with patient history and other diagnostic information is  necessary to determine patient infection status.  Positive results do  not rule out bacterial infection or co-infection with other viruses. If result is PRESUMPTIVE POSTIVE SARS-CoV-2 nucleic acids MAY BE PRESENT.   A presumptive positive result was obtained on the submitted specimen  and confirmed on repeat testing.  While 2019 novel coronavirus  (SARS-CoV-2) nucleic acids may be present in the submitted sample  additional confirmatory testing may be necessary for epidemiological  and / or clinical management purposes  to differentiate between  SARS-CoV-2 and other Sarbecovirus currently known to infect humans.  If clinically indicated additional testing with an alternate test  methodology [phone]) is advised. The SARS-CoV-2 RNA is generally  detectable in upper and lower respiratory sp ecimens during the acute  phase of infection. The expected result is Negative. Fact Sheet for Patients:  StrictlyIdeas.no Fact Sheet for Healthcare Providers: BankingDealers.co.za This test is not yet approved or cleared by the [place] FDA and has been authorized for detection and/or diagnosis of SARS-CoV-2 by FDA under an Emergency Use Authorization (EUA).  This EUA will remain in effect (meaning this test can be used) for the duration of the COVID-19 declaration under Section 564(b)(1) of the Act, 21 U.S.C. section 360bbb-3(b)(1), unless the authorization is terminated or revoked sooner. Performed at [facility], [place] [address] [place], [place] [address]   Surgical pcr screen     Status: Abnormal   Collection Time: 02/18/19 11:26 PM  Result Value Ref Range Status   MRSA, PCR NEGATIVE NEGATIVE Final   Staphylococcus aureus POSITIVE (A) NEGATIVE Final    Comment: CRITICAL RESULT CALLED TO, READ BACK BY AND  VERIFIED WITH: RN, K. Darral Dash [id number] '@[id number]'$  THANEY Performed at [facility] [address] [place], [place] [address]     Radiology Reports Dg Lumbar Spine Complete  Result Date: 02/15/2019 CLINICAL DATA:  Lower extremity weakness. EXAM: LUMBAR SPINE - COMPLETE 4+ VIEW COMPARISON:  CT and pelvis dated 03/03/2018 FINDINGS: There is no displaced fracture. Degenerative changes are noted of the lumbar spine with moderate to severe disc height  loss at the L5-S1 level. There is facet arthrosis of the lower lumbar segments. Atherosclerotic changes are noted of the abdominal aorta. A phlebolith projects over the patient's left hemipelvis. A moderate amount of stool is noted in the colon. IMPRESSION: 1. No acute osseous abnormality. 2. Multilevel degenerative changes, greatest at the L5-S1 level. 3. Atherosclerotic changes of the abdominal aorta. Electronically Signed   By: Constance Holster M.D.   On: 02/15/2019 17:38   Ct Chest W Contrast  Result Date: 02/17/2019 CLINICAL DATA:  Generalized muscle weakness, evaluate for malignant etiology EXAM: CT CHEST, ABDOMEN, AND PELVIS WITH CONTRAST TECHNIQUE: Multidetector CT imaging of the chest, abdomen and pelvis was performed following the standard protocol during bolus administration of intravenous contrast. CONTRAST:  121m OMNIPAQUE IOHEXOL 300 MG/ML  SOLN COMPARISON:  CT abdomen pelvis, 03/03/2018, CT chest, 10/12/2014 FINDINGS: CT CHEST FINDINGS Cardiovascular: Extensive 3 vessel coronary artery calcifications and/or stents. Normal heart size. No pericardial effusion. Mediastinum/Nodes: No enlarged mediastinal, hilar, or axillary lymph nodes. Thyroid gland, trachea, and esophagus demonstrate no significant findings. Lungs/Pleura: Multiple small benign calcified pulmonary nodules present bilaterally. Innumerable tiny centrilobular pulmonary nodules in an apically predominant distribution, which may reflect sequelae of atypical infection or  smoking-related respiratory bronchiolitis. There is an unchanged, benign somewhat bandlike consolidation in the right lung base. No pleural effusion or pneumothorax. Musculoskeletal: No chest wall mass or suspicious bone lesions identified. CT ABDOMEN PELVIS FINDINGS Hepatobiliary: No focal liver abnormality is seen. No gallstones, gallbladder wall thickening, or biliary dilatation. Pancreas: Unremarkable. No pancreatic ductal dilatation or surrounding inflammatory changes. Spleen: Normal in size without focal abnormality. Adrenals/Urinary Tract: Adrenal glands are unremarkable. Kidneys are normal, without renal calculi, focal lesion, or hydronephrosis. Bladder is unremarkable. Stomach/Bowel: Stomach is within normal limits. Appendix appears normal. No evidence of bowel wall thickening, distention, or inflammatory changes. Vascular/Lymphatic: Severe mixed calcific atherosclerosis of the abdominal aorta and branch vessels. No enlarged abdominal or pelvic lymph nodes. Reproductive: Prostatomegaly. Other: No abdominal wall hernia or abnormality. No abdominopelvic ascites. Musculoskeletal: No acute or significant osseous findings. IMPRESSION: 1.  No CT evidence of malignancy in the chest, abdomen, or pelvis. 2. Innumerable tiny centrilobular pulmonary nodules in an apically predominant distribution, which may reflect sequelae of atypical infection or smoking-related respiratory bronchiolitis. There is an unchanged, benign somewhat bandlike consolidation in the right lung base. 3.  Coronary artery disease. 4.  Atherosclerosis. 5.  Prostatomegaly. Electronically Signed   By: AEddie CandleM.D.   On: 02/17/2019 16:25   Mr Cervical Spine Wo Contrast  Result Date: 02/17/2019 CLINICAL DATA:  Myelopathy. Progressive lower extremity weakness. Multiple falls. 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 EXAM: MRI CERVICAL SPINE WITHOUT CONTRAST TECHNIQUE: Multiplanar, multisequence MR imaging of the  cervical spine was performed. No intravenous contrast was administered. COMPARISON:  None. FINDINGS: Alignment: Minimal retrolisthesis of C3 on C4. Vertebrae: C4-5 and C5-6 interbody osseous fusion with anterior fusion plate and screws in place at C4-5. No fracture, suspicious osseous lesion, or significant marrow edema. Chronic degenerative endplate changes at CI9-4greater than C3-4 associated with disc space narrowing. Cord: Well-defined T2 hyperintensity in the cord bilaterally at C4 and C5 with mild cord volume loss more notable at C4 consistent with myelomalacia. Posterior Fossa, vertebral arteries, paraspinal tissues: Dominant appearing left vertebral artery with preserved flow void. Poor visualization of the right vertebral artery which is likely hypoplastic. Unremarkable included portion of the posterior fossa. Disc levels: C2-3: Minimal disc bulging and moderate left facet arthrosis without stenosis.  C3-4: Disc bulging and uncovertebral spurring result in mild-to-moderate spinal stenosis and moderate right and mild-to-moderate left neural foraminal stenosis. C4-5: Anterior fusion.  No significant stenosis. C5-6: Anterior fusion. Right-sided osteophytic ridging results in mild right neural foraminal stenosis. Patent spinal canal and left neural foramen. C6-7: Broad-based posterior disc osteophyte complex results in moderate spinal stenosis and moderate right and mild left neural foraminal stenosis. C7-T1: Negative. IMPRESSION: 1. 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 2. Solid anterior fusion at C4-5 and C5-6 without spinal stenosis at these levels. 3. Adjacent segment degenerative changes with moderate spinal stenosis at C6-7, mild-to-moderate spinal stenosis at C3-4, and mild-to-moderate neural foraminal stenosis at both levels. Electronically Signed   By: Logan Bores M.D.   On: 02/17/2019 12:17   Mr Thoracic Spine Wo Contrast  Result Date: 02/17/2019 CLINICAL DATA:  Myelopathy. Progressive lower extremity  weakness. Multiple falls. 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 EXAM: MRI THORACIC SPINE WITHOUT CONTRAST TECHNIQUE: Multiplanar, multisequence MR imaging of the thoracic spine was performed. No intravenous contrast was administered. COMPARISON:  Chest CT 10/31/2015 FINDINGS: Alignment:  Normal. Vertebrae: No fracture, suspicious osseous lesion, or significant marrow edema. Small hemangioma in the T8 vertebral body. Cord: Slight prominence of the central canal of the spinal cord in the mid to lower thoracic spine most notably at T9-10. No frank syrinx formation. Normal cord signal elsewhere. Paraspinal and other soft tissues: Partially visualized 1.4 cm T2 hyperintense lesion in the upper pole of the right kidney as well as a subcentimeter T2 hyperintensity in the upper pole of the left kidney, likely cysts but incompletely evaluated. Trace bilateral pleural fluid. Disc levels: Mild disc bulging at T1-2, T2-3, and T5-6. Tiny left paracentral disc protrusions at T6-7 and T7-8. No spinal stenosis or cord compression. Mild facet arthrosis in the upper and lower thoracic spine without evidence of compressive neural foraminal stenosis. IMPRESSION: 1. Mild thoracic disc and facet degeneration without evidence of neural impingement. 2. Minimal prominence of the central canal of the mid to lower thoracic spinal cord without syrinx. Electronically Signed   By: Logan Bores M.D.   On: 02/17/2019 12:36   Mr Lumbar Spine Wo Contrast  Result Date: 02/17/2019 CLINICAL DATA:  Myelopathy. Progressive lower extremity weakness. Multiple falls. 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 EXAM: MRI LUMBAR SPINE WITHOUT CONTRAST TECHNIQUE: Multiplanar, multisequence MR imaging of the lumbar spine was performed. No intravenous contrast was administered. COMPARISON:  Lumbar spine radiographs 02/15/2019. CT abdomen and pelvis 03/03/2018. FINDINGS: Segmentation:  Standard.  Alignment:  Normal. Vertebrae: No fracture or suspicious osseous lesion. Moderate degenerative endplate changes at V3-Z4, predominantly Modic type 2. Conus medullaris and cauda equina: Conus extends to the T12-L1 level. Conus and cauda equina appear normal. Paraspinal and other soft tissues: Unremarkable. Disc levels: Disc desiccation throughout the lumbar spine with exception of L1-2. Moderate to severe disc space narrowing at L5-S1 with milder narrowing at L2-3, L3-4, and L4-5. L1-2: Normal disc.  Slight facet hypertrophy without stenosis. L2-3: Circumferential disc bulging and mild facet and ligamentum flavum hypertrophy without significant stenosis. L3-4: Circumferential disc bulging, a small right foraminal to extraforaminal disc protrusion, and mild facet and ligamentum flavum hypertrophy result in borderline lateral recess stenosis and mild bilateral neural foraminal stenosis with the disc protrusion potentially affecting the exiting right L3 nerve root. No significant spinal stenosis. L4-5: Circumferential disc bulging, a right foraminal to extraforaminal disc osteophyte complex, and mild facet and ligamentum flavum hypertrophy result in mild right neural  foraminal stenosis with the disc osteophyte complex potentially affecting the right L4 nerve lateral to the foramen. No significant spinal stenosis. L5-S1: Prior left laminectomy. Broad central disc protrusion, disc bulging asymmetric to the left, and mild facet hypertrophy result in mild right and moderate left neural foraminal stenosis without spinal stenosis. There is likely some epidural fibrosis in the left lateral recess. IMPRESSION: 1. Relatively diffuse lumbar disc and facet degeneration without significant spinal stenosis. 2. Multilevel neural foraminal stenosis, moderate on the left at L5-S1. 3. Normal appearance of the conus medullaris and cauda equina. Electronically Signed   By: Logan Bores M.D.   On: 02/17/2019 12:28   Ct Abdomen Pelvis W  Contrast  Result Date: 02/17/2019 CLINICAL DATA:  Generalized muscle weakness, evaluate for malignant etiology EXAM: CT CHEST, ABDOMEN, AND PELVIS WITH CONTRAST TECHNIQUE: Multidetector CT imaging of the chest, abdomen and pelvis was performed following the standard protocol during bolus administration of intravenous contrast. CONTRAST:  140m OMNIPAQUE IOHEXOL 300 MG/ML  SOLN COMPARISON:  CT abdomen pelvis, 03/03/2018, CT chest, 10/12/2014 FINDINGS: CT CHEST FINDINGS Cardiovascular: Extensive 3 vessel coronary artery calcifications and/or stents. Normal heart size. No pericardial effusion. Mediastinum/Nodes: No enlarged mediastinal, hilar, or axillary lymph nodes. Thyroid gland, trachea, and esophagus demonstrate no significant findings. Lungs/Pleura: Multiple small benign calcified pulmonary nodules present bilaterally. Innumerable tiny centrilobular pulmonary nodules in an apically predominant distribution, which may reflect sequelae of atypical infection or smoking-related respiratory bronchiolitis. There is an unchanged, benign somewhat bandlike consolidation in the right lung base. No pleural effusion or pneumothorax. Musculoskeletal: No chest wall mass or suspicious bone lesions identified. CT ABDOMEN PELVIS FINDINGS Hepatobiliary: No focal liver abnormality is seen. No gallstones, gallbladder wall thickening, or biliary dilatation. Pancreas: Unremarkable. No pancreatic ductal dilatation or surrounding inflammatory changes. Spleen: Normal in size without focal abnormality. Adrenals/Urinary Tract: Adrenal glands are unremarkable. Kidneys are normal, without renal calculi, focal lesion, or hydronephrosis. Bladder is unremarkable. Stomach/Bowel: Stomach is within normal limits. Appendix appears normal. No evidence of bowel wall thickening, distention, or inflammatory changes. Vascular/Lymphatic: Severe mixed calcific atherosclerosis of the abdominal aorta and branch vessels. No enlarged abdominal or pelvic  lymph nodes. Reproductive: Prostatomegaly. Other: No abdominal wall hernia or abnormality. No abdominopelvic ascites. Musculoskeletal: No acute or significant osseous findings. IMPRESSION: 1.  No CT evidence of malignancy in the chest, abdomen, or pelvis. 2. Innumerable tiny centrilobular pulmonary nodules in an apically predominant distribution, which may reflect sequelae of atypical infection or smoking-related respiratory bronchiolitis. There is an unchanged, benign somewhat bandlike consolidation in the right lung base. 3.  Coronary artery disease. 4.  Atherosclerosis. 5.  Prostatomegaly. Electronically Signed   By: AEddie CandleM.D.   On: 02/17/2019 16:25   Mr Femur Left W Wo Contrast  Result Date: 02/18/2019 CLINICAL DATA:  Progressive lower extremity and proximal muscle weakness. Elevated CK levels and elevated liver function tests. EXAM: MR OF THE LEFT LOWER EXTREMITY WITHOUT AND WITH CONTRAST TECHNIQUE: Multiplanar, multisequence MR imaging of the left thigh was performed both before and after administration of intravenous contrast. CONTRAST:  8 cc Gadavist COMPARISON:  None. FINDINGS: Bones/Joint/Cartilage Unremarkable Ligaments N/A Muscles and Tendons There is abnormal diffuse muscle edema and enhancement primarily involving the gluteus maximus, gluteus medius, hip adductor musculature, hamstring musculature, tensor fascia lata, iloipsoas, and medial head gastrocnemius muscles. Coronal images reveal is to be a roughly symmetric process. Much lesser degree of involvement in the gluteus maximus, vastus intermedius, and vastus lateralis muscles with only focal low-grade activity in these  muscle groups. No abscess or discrete fluid collection is identified. Soft tissues No overt cutaneous or subcutaneous edema identified. IMPRESSION: 1. Abnormal diffuse muscular edema and enhancement involving various muscles of the lower pelvis and thigh as detailed above. The coronal images indicate that this is a  grossly symmetric process. I note that there is a plan for quadriceps muscle biopsy; I would recommend targeting the rectus femoris muscle and referring to today's MRI images given that the other quadriceps muscles demonstrate only minimal involvement. 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 Electronically Signed   By: Van Clines M.D.   On: 02/18/2019 11:41   Dg Chest Port 1v Same Day  Result Date: 02/17/2019 CLINICAL DATA:  Dyspnea. EXAM: PORTABLE CHEST 1 VIEW COMPARISON:  CT scan of October 31, 2015. FINDINGS: The heart size and mediastinal contours are within normal limits. Both lungs are clear. No pneumothorax or pleural effusion is noted. The visualized skeletal structures are unremarkable. IMPRESSION: No active disease. Electronically Signed   By: Marijo Conception M.D.   On: 02/17/2019 09:06    Lab Data:  CBC: Recent Labs  Lab 02/15/19 1203 02/16/19 1400 02/17/19 0212 02/18/19 0042 02/19/19 0701  WBC 4.5 4.9 4.6 3.6* 4.9  NEUTROABS 2.7 2.6  --   --   --   HGB 15.6 13.7 13.0 13.4 13.5  HCT 46.1 41.2 39.0 40.0 40.3  MCV 92.3 91.8 91.8 91.3 90.6  PLT 224.0 213 196 202 891   Basic Metabolic Panel: Recent Labs  Lab 02/16/19 1400 02/16/19 2321 02/17/19 1438 02/18/19 0042 02/19/19 0701  NA 139 139 138 135 140  K 3.7 3.8 3.9 3.8 4.0  CL 101 107 103 104 102  CO2 '27 27 27 25 28  '$ GLUCOSE 90 94 95 102* 100*  BUN '11 10 10 9 8  '$ CREATININE 0.68 0.62 0.64 0.65 0.63  CALCIUM 9.2 8.6* 8.9 8.8* 8.9   GFR: Estimated Creatinine Clearance: 92.1 mL/min (by C-G formula based on SCr of 0.63 mg/dL). Liver Function Tests: Recent Labs  Lab 02/16/19 1524 02/16/19 2321  02/17/19 1438 02/18/19 0042 02/19/19 0701  AST 469* 475* 490* 454* 394*  ALT 504* 465* 493* 466* 445*  ALKPHOS 59 52 57 60 59  BILITOT 0.4 0.5 0.4 0.5 0.6  PROT 6.6 5.9* 6.4* 6.0* 6.0*  ALBUMIN 3.4* 3.1* 3.4* 3.2* 3.1*   No results for input(s): LIPASE, AMYLASE in the last 168 hours. No results for input(s): AMMONIA in the last 168 hours. Coagulation Profile: No results for input(s): INR, PROTIME in the last 168 hours. Cardiac Enzymes: Recent Labs  Lab 02/17/19 0655 02/17/19 1438 02/18/19 0042 02/18/19 1215 02/19/19 0012 02/19/19 0701  CKTOTAL  --  16,916* 16,393* 15,260* 12,702* 12,575*  TROPONINI 0.03*  --   --   --   --   --    BNP (last 3 results) No results for input(s): PROBNP in the last 8760 hours. HbA1C: No results for input(s): HGBA1C in the last 72 hours. CBG: No results for input(s): GLUCAP in the last 168 hours. Lipid Profile: No results for input(s): CHOL, HDL, LDLCALC, TRIG, CHOLHDL, LDLDIRECT in the last 72 hours. Thyroid Function Tests: Recent Labs    02/17/19 1438  FREET4 0.78*   Anemia Panel: No results for  input(s): VITAMINB12, FOLATE, FERRITIN, TIBC, IRON, RETICCTPCT in the last 72 hours. Urine analysis:    Component Value Date/Time   COLORURINE AMBER (A) 02/16/2019 1627   APPEARANCEUR CLOUDY (A) 02/16/2019 1627   APPEARANCEUR Clear 11/28/2017 0944   LABSPEC 1.023 02/16/2019 1627   PHURINE 5.0 02/16/2019 1627   GLUCOSEU NEGATIVE 02/16/2019 1627   HGBUR LARGE (A) 02/16/2019 1627   BILIRUBINUR NEGATIVE 02/16/2019 1627   BILIRUBINUR Negative 11/28/2017 0944   KETONESUR NEGATIVE 02/16/2019 1627   PROTEINUR 100 (A) 02/16/2019 1627   NITRITE NEGATIVE 02/16/2019 1627   LEUKOCYTESUR NEGATIVE 02/16/2019 1627     Tatem Holsonback M.D. [facility] 02/19/2019, 1:22 PM  Pager: [phone] Between 7am to 7pm - call Pager - [phone]  After 7pm go to [email or web address] - password [email or web address]  Call night coverage person covering after 7pm

## 2019-02-19 NOTE — Plan of Care (Signed)

## 2019-02-19 NOTE — TOC Initial Note (Signed)
Transition of Care [facility]) - Initial/Assessment Note    Patient Details  Name: Stanley Boyd MRN: [id number] Date of Birth: 1951-02-11  Transition of Care [facility]) CM/SW Contact:    Sherrilyn Rist Phone Number: [phone] 02/19/2019, 11:02 AM  Clinical Narrative:                 Patient lives at home with spouse; independent of all of his ADL's; Outpatient Primary MD for the patient is Marin Olp, MD; has private insurance with BCBS with prescription drug coverage; no needs identified at this time. CM will continue to follow for progression of care.  Expected Discharge Plan: Home/Self Care Barriers to Discharge: No Barriers Identified   Patient Goals and CMS Choice     Choice offered to / list presented to : NA  Expected Discharge Plan and Services Expected Discharge Plan: Home/Self Care In-house Referral: NA Discharge Planning Services: CM Consult Post Acute Care Choice: NA Living arrangements for the past 2 months: Single Family Home                   DME Agency: NA       HH Arranged: NA          Prior Living Arrangements/Services Living arrangements for the past 2 months: Single Family Home Lives with:: Spouse Patient language and need for interpreter reviewed:: No        Need for Family Participation in Patient Care: No (Comment) Care giver support system in place?: Yes (comment)   Criminal Activity/Legal Involvement Pertinent to Current Situation/Hospitalization: No - Comment as needed  Activities of Daily Living Home Assistive Devices/Equipment: None ADL Screening (condition at time of admission) Patient's cognitive ability adequate to safely complete daily activities?: Yes Is the patient deaf or have difficulty hearing?: No Does the patient have difficulty seeing, even when wearing glasses/contacts?: No Does the patient have difficulty concentrating, remembering, or making decisions?: No Patient able to express need for assistance  with ADLs?: No Does the patient have difficulty dressing or bathing?: No Independently performs ADLs?: Yes (appropriate for developmental age) Does the patient have difficulty walking or climbing stairs?: No Weakness of Legs: Both Weakness of Arms/Hands: None  Permission Sought/Granted                  Emotional Assessment         Alcohol / Substance Use: Not Applicable Psych Involvement: No (comment)  Admission diagnosis:  Rash [R21] Elevated CK [R74.8] Patient Active Problem List   Diagnosis Date Noted  . Rhabdomyolysis 02/16/2019  . Weakness 02/16/2019  . Elevated PSA 02/25/2017  . Post PTCA 04/16/2016  . CAD (coronary artery disease), native coronary artery 07/13/2015  . S/P PTCA (percutaneous transluminal coronary angioplasty) 07/08/2015  . History of colonic polyps 10/15/2014  . Hemiparesis ([place]) 09/18/2014  . BPH (benign prostatic hyperplasia) 09/18/2014  . Transaminitis 09/18/2014  . Hyperlipidemia   . Bradycardia    PCP:  Marin Olp, MD Pharmacy:   [facility] [address] [place] [address] Phone: [phone] Fax: [phone]     Social Determinants of Health (SDOH) Interventions    Readmission Risk Interventions No flowsheet data found.

## 2019-02-20 ENCOUNTER — Encounter: Payer: Self-pay | Admitting: Family Medicine

## 2019-02-20 ENCOUNTER — Telehealth: Payer: Self-pay | Admitting: Family Medicine

## 2019-02-20 ENCOUNTER — Inpatient Hospital Stay (HOSPITAL_COMMUNITY): Payer: Medicare Other | Admitting: Registered Nurse

## 2019-02-20 ENCOUNTER — Encounter (HOSPITAL_COMMUNITY)
Admission: EM | Disposition: A | Discharge status: Home / Self Care | Payer: Self-pay | Source: Home / Self Care | Attending: Internal Medicine

## 2019-02-20 DIAGNOSIS — R21 Rash and other nonspecific skin eruption: Secondary | ICD-10-CM

## 2019-02-20 DIAGNOSIS — M6281 Muscle weakness (generalized): Secondary | ICD-10-CM

## 2019-02-20 DIAGNOSIS — M3320 Polymyositis, organ involvement unspecified: Secondary | ICD-10-CM

## 2019-02-20 HISTORY — PX: MUSCLE BIOPSY: SHX716

## 2019-02-20 LAB — COMPREHENSIVE METABOLIC PANEL
ALT: 468 U/L — ABNORMAL HIGH (ref 0–44)
AST: 381 U/L — ABNORMAL HIGH (ref 15–41)
Albumin: 3.3 g/dL — ABNORMAL LOW (ref 3.5–5.0)
Alkaline Phosphatase: 61 U/L (ref 38–126)
Anion gap: 8 (ref 5–15)
BUN: 12 mg/dL (ref 8–23)
CO2: 25 mmol/L (ref 22–32)
Calcium: 9.2 mg/dL (ref 8.9–10.3)
Chloride: 105 mmol/L (ref 98–111)
Creatinine, Ser: 0.67 mg/dL (ref 0.61–1.24)
GFR calc Af Amer: >60 mL/min (ref >60)
GFR calc non Af Amer: >60 mL/min (ref >60)
Glucose, Bld: 119 mg/dL — ABNORMAL HIGH (ref 70–99)
Potassium: 3.9 mmol/L (ref 3.5–5.1)
Sodium: 138 mmol/L (ref 135–145)
Total Bilirubin: 0.6 mg/dL (ref 0.3–1.2)
Total Protein: 6.3 g/dL — ABNORMAL LOW (ref 6.5–8.1)

## 2019-02-20 LAB — MYOGLOBIN, SERUM: Myoglobin: 4680 ng/mL — ABNORMAL HIGH (ref 28–72)

## 2019-02-20 LAB — CBC
HCT: 41.1 % (ref 39.0–52.0)
Hemoglobin: 13.9 g/dL (ref 13.0–17.0)
MCH: 30.6 pg (ref 26.0–34.0)
MCHC: 33.8 g/dL (ref 30.0–36.0)
MCV: 90.5 fL (ref 80.0–100.0)
Platelets: 238 10*3/uL (ref 150–400)
RBC: 4.54 MIL/uL (ref 4.22–5.81)
RDW: 12.2 % (ref 11.5–15.5)
WBC: 9.4 10*3/uL (ref 4.0–10.5)
nRBC: 0 % (ref 0.0–0.2)

## 2019-02-20 LAB — MISC LABCORP TEST (SEND OUT): Labcorp test code: 813940

## 2019-02-20 LAB — ROCKY MTN SPOTTED FVR ABS PNL(IGG+IGM)
RMSF IgG: POSITIVE — AB
RMSF IgM: 0.5 index (ref 0.00–0.89)

## 2019-02-20 LAB — CK: Total CK: 11953 U/L — ABNORMAL HIGH (ref 49–397)

## 2019-02-20 LAB — RMSF, IGG, IFA: RMSF, IGG, IFA: 1:64 {titer} — ABNORMAL HIGH

## 2019-02-20 SURGERY — MUSCLE BIOPSY
Anesthesia: General | Site: Leg Upper

## 2019-02-20 MED ORDER — VITAMIN B-12 1000 MCG PO TABS: 1000.0000 ug | ORAL_TABLET | Freq: Every day | ORAL | 0 refills | Status: DC

## 2019-02-20 MED ORDER — FENTANYL CITRATE (PF) 100 MCG/2ML IJ SOLN: 25.0000 ug | INTRAMUSCULAR | Status: DC | PRN

## 2019-02-20 MED ORDER — LEVOTHYROXINE SODIUM 25 MCG PO TABS: 12.5000 ug | ORAL_TABLET | Freq: Every day | ORAL | 0 refills | Status: DC

## 2019-02-20 MED ORDER — BUPIVACAINE-EPINEPHRINE (PF) 0.5% -1:200000 IJ SOLN
INTRAMUSCULAR | Status: DC | PRN
  Administered 2019-02-20: 4 mL

## 2019-02-20 MED ORDER — PANTOPRAZOLE SODIUM 40 MG PO TBEC: 40.0000 mg | DELAYED_RELEASE_TABLET | Freq: Every day | ORAL | 0 refills | Status: DC

## 2019-02-20 MED ORDER — CEFAZOLIN SODIUM-DEXTROSE 2-3 GM-%(50ML) IV SOLR
INTRAVENOUS | Status: DC | PRN
  Administered 2019-02-20: 2 g via INTRAVENOUS

## 2019-02-20 MED ORDER — DEXAMETHASONE SODIUM PHOSPHATE 10 MG/ML IJ SOLN
INTRAMUSCULAR | Status: DC | PRN
  Administered 2019-02-20: 10 mg via INTRAVENOUS

## 2019-02-20 MED ORDER — ONDANSETRON HCL 4 MG/2ML IJ SOLN
INTRAMUSCULAR | Status: AC
  Filled 2019-02-20: qty 2

## 2019-02-20 MED ORDER — PROMETHAZINE HCL 25 MG/ML IJ SOLN: 6.2500 mg | INTRAMUSCULAR | Status: DC | PRN

## 2019-02-20 MED ORDER — LACTATED RINGERS IV SOLN
INTRAVENOUS | Status: DC
  Administered 2019-02-20: 09:00:00 via INTRAVENOUS

## 2019-02-20 MED ORDER — PHENYLEPHRINE 40 MCG/ML (10ML) SYRINGE FOR IV PUSH (FOR BLOOD PRESSURE SUPPORT)
PREFILLED_SYRINGE | INTRAVENOUS | Status: DC | PRN
  Administered 2019-02-20: 120 ug via INTRAVENOUS
  Administered 2019-02-20: 160 ug via INTRAVENOUS
  Administered 2019-02-20 (×2): 200 ug via INTRAVENOUS
  Administered 2019-02-20: 80 ug via INTRAVENOUS

## 2019-02-20 MED ORDER — CALCIUM CARBONATE-VITAMIN D 500-200 MG-UNIT PO TABS: 1.0000 | ORAL_TABLET | Freq: Three times a day (TID) | ORAL | 0 refills | Status: DC

## 2019-02-20 MED ORDER — PREDNISONE 20 MG PO TABS: 80.0000 mg | ORAL_TABLET | Freq: Every day | ORAL | 0 refills | Status: DC

## 2019-02-20 MED ORDER — FLUTICASONE PROPIONATE 50 MCG/ACT NA SUSP: 2.0000 | Freq: Every day | NASAL | Status: DC | PRN

## 2019-02-20 MED ORDER — PROPOFOL 10 MG/ML IV BOLUS
INTRAVENOUS | Status: DC | PRN
  Administered 2019-02-20: 200 mg via INTRAVENOUS

## 2019-02-20 MED ORDER — MEPERIDINE HCL 25 MG/ML IJ SOLN: 6.2500 mg | INTRAMUSCULAR | Status: DC | PRN

## 2019-02-20 MED ORDER — ASPIRIN 81 MG PO CHEW: 81.0000 mg | CHEWABLE_TABLET | Freq: Every day | ORAL | Status: DC

## 2019-02-20 MED ORDER — FENTANYL CITRATE (PF) 250 MCG/5ML IJ SOLN
INTRAMUSCULAR | Status: AC
  Filled 2019-02-20: qty 5

## 2019-02-20 MED ORDER — MIDAZOLAM HCL 2 MG/2ML IJ SOLN: 0.5000 mg | Freq: Once | INTRAMUSCULAR | Status: DC | PRN

## 2019-02-20 MED ORDER — 0.9 % SODIUM CHLORIDE (POUR BTL) OPTIME
TOPICAL | Status: DC | PRN
  Administered 2019-02-20: 11:00:00 1000 mL

## 2019-02-20 MED ORDER — MIDAZOLAM HCL 2 MG/2ML IJ SOLN
INTRAMUSCULAR | Status: DC | PRN
  Administered 2019-02-20: 2 mg via INTRAVENOUS

## 2019-02-20 MED ORDER — MIDAZOLAM HCL 2 MG/2ML IJ SOLN
INTRAMUSCULAR | Status: AC
  Filled 2019-02-20: qty 2

## 2019-02-20 MED ORDER — CEFAZOLIN SODIUM-DEXTROSE 2-4 GM/100ML-% IV SOLN
INTRAVENOUS | Status: AC
  Filled 2019-02-20: qty 100

## 2019-02-20 MED ORDER — PROPOFOL 10 MG/ML IV BOLUS
INTRAVENOUS | Status: AC
  Filled 2019-02-20: qty 20

## 2019-02-20 MED ORDER — FENTANYL CITRATE (PF) 250 MCG/5ML IJ SOLN
INTRAMUSCULAR | Status: DC | PRN
  Administered 2019-02-20 (×2): 50 ug via INTRAVENOUS

## 2019-02-20 MED ORDER — BUPIVACAINE-EPINEPHRINE (PF) 0.5% -1:200000 IJ SOLN
INTRAMUSCULAR | Status: AC
  Filled 2019-02-20: qty 30

## 2019-02-20 MED ORDER — EPHEDRINE SULFATE 50 MG/ML IJ SOLN
INTRAMUSCULAR | Status: DC | PRN
  Administered 2019-02-20: 5 mg via INTRAVENOUS
  Administered 2019-02-20: 10 mg via INTRAVENOUS
  Administered 2019-02-20: 15 mg via INTRAVENOUS
  Administered 2019-02-20 (×2): 10 mg via INTRAVENOUS

## 2019-02-20 MED ORDER — LIDOCAINE 2% (20 MG/ML) 5 ML SYRINGE
INTRAMUSCULAR | Status: DC | PRN
  Administered 2019-02-20: 40 mg via INTRAVENOUS

## 2019-02-20 MED ORDER — SULFAMETHOXAZOLE-TRIMETHOPRIM 800-160 MG PO TABS: 1.0000 | ORAL_TABLET | ORAL | 0 refills | Status: DC

## 2019-02-20 MED ORDER — ONDANSETRON HCL 4 MG/2ML IJ SOLN
INTRAMUSCULAR | Status: DC | PRN
  Administered 2019-02-20: 4 mg via INTRAVENOUS

## 2019-02-20 SURGICAL SUPPLY — 36 items
CHLORAPREP W/TINT 10.5 ML (MISCELLANEOUS) ×2 IMPLANT
CONT SPEC 4OZ CLIKSEAL STRL BL (MISCELLANEOUS) ×4 IMPLANT
COVER SURGICAL LIGHT HANDLE (MISCELLANEOUS) ×2 IMPLANT
DERMABOND ADVANCED (GAUZE/BANDAGES/DRESSINGS) ×2
DERMABOND ADVANCED .7 DNX12 (GAUZE/BANDAGES/DRESSINGS) ×2 IMPLANT
DRAPE LAPAROTOMY 100X72 PEDS (DRAPES) ×2 IMPLANT
DRAPE UTILITY XL STRL (DRAPES) ×2 IMPLANT
DRSG TELFA 3X8 NADH (GAUZE/BANDAGES/DRESSINGS) ×2 IMPLANT
ELECT REM PT RETURN 9FT ADLT (ELECTROSURGICAL) ×2
ELECTRODE REM PT RTRN 9FT ADLT (ELECTROSURGICAL) ×1 IMPLANT
GAUZE 4X4 16PLY RFD (DISPOSABLE) ×2 IMPLANT
GLOVE BIO SURGEON STRL SZ7.5 (GLOVE) ×2 IMPLANT
GLOVE BIOGEL PI IND STRL 6.5 (GLOVE) ×2 IMPLANT
GLOVE BIOGEL PI INDICATOR 6.5 (GLOVE) ×2
GLOVE INDICATOR 8.0 STRL GRN (GLOVE) ×2 IMPLANT
GLOVE SURG SS PI 6.0 STRL IVOR (GLOVE) ×2 IMPLANT
GOWN STRL REUS W/ TWL LRG LVL3 (GOWN DISPOSABLE) ×1 IMPLANT
GOWN STRL REUS W/ TWL XL LVL3 (GOWN DISPOSABLE) ×1 IMPLANT
GOWN STRL REUS W/TWL LRG LVL3 (GOWN DISPOSABLE) ×1
GOWN STRL REUS W/TWL XL LVL3 (GOWN DISPOSABLE) ×1
KIT BASIN OR (CUSTOM PROCEDURE TRAY) ×2 IMPLANT
KIT TURNOVER KIT B (KITS) ×2 IMPLANT
NEEDLE HYPO 25GX1X1/2 BEV (NEEDLE) ×2 IMPLANT
NS IRRIG 1000ML POUR BTL (IV SOLUTION) ×2 IMPLANT
PACK GENERAL/GYN (CUSTOM PROCEDURE TRAY) ×2 IMPLANT
PAD ARMBOARD 7.5X6 YLW CONV (MISCELLANEOUS) ×2 IMPLANT
PENCIL SMOKE EVACUATOR (MISCELLANEOUS) ×2 IMPLANT
PUNCH BIOPSY 4MM (MISCELLANEOUS) ×2
PUNCH BIOPSY DISP 4 (MISCELLANEOUS) ×1 IMPLANT
SUT MNCRL AB 4-0 PS2 18 (SUTURE) ×2 IMPLANT
SUT VIC AB 2-0 SH 27 (SUTURE) ×1
SUT VIC AB 2-0 SH 27XBRD (SUTURE) ×1 IMPLANT
SUT VIC AB 3-0 SH 27 (SUTURE) ×1
SUT VIC AB 3-0 SH 27XBRD (SUTURE) ×1 IMPLANT
SYR CONTROL 10ML LL (SYRINGE) ×2 IMPLANT
TOWEL GREEN STERILE FF (TOWEL DISPOSABLE) ×2 IMPLANT

## 2019-02-20 NOTE — Op Note (Signed)
02/16/2019 - 02/20/2019  11:02 AM  PATIENT:  Stanley Boyd  68 y.o. male  Patient Care Team: Marin Olp, MD as PCP - General (Family Medicine)  PRE-OPERATIVE DIAGNOSIS:  proximal muscle weakness with rhabdomyolysis  POST-OPERATIVE DIAGNOSIS:  Same  PROCEDURE:   1. Open biopsy of left rectus femoris muscle 3 x 1 cm in size 2. Punch biopsy of right chest wall skin - 4 mm  SURGEON:  Sharon Mt. Nara Paternoster, MD  ASSISTANT: OR staff  ANESTHESIA:   general  COUNTS:  Sponge, needle and instrument counts were reported correct x2 at the conclusion of the operation.  EBL: 10 mL  DRAINS: None  SPECIMEN:  1. Left rectus femoris muscle 2. Right chest wall skin punch biopsy  COMPLICATIONS: None  FINDINGS:  Left rectus femoris muscle biopsy performed, grossly normal appearing muscle; right chest wall with flat red skin lesions - punch biopsy performed including border of grossly normal appearing skin as well.  DISPOSITION: PACU in satisfactory condition  INDICATION: Stanley Boyd is a very pleasant 77yoM with hx of CAD, HLD, BPH admitted with progressively worsening muscle weakness and rhabdomyolysis; additionally skin lesions/rash on chest wall. We were asked by neurology for biopsy of left quadricept muscle and additionally a punch biopsy of the patches of red skin on right chest wall. The procedure was reviewed with the patient whom opted to proceed with surgery for further diagnostic workup. Please refer to notes elsewhere for details regarding this discussion.  DESCRIPTION: The patient was identified in preop holding and taken to the OR where he was placed on the operating room table. SCDs were placed. General endotracheal anesthesia was induced without difficulty. Skin on the regions of planned biopsies was clipped by the OR staff. He was then prepped and draped in the usual sterile fashion. A surgical timeout was performed indicating the correct patient, procedure, positioning and need  for preoperative antibiotics.   Over the anterior left mid thigh local anesthetic was infiltrated and a vertical incision was made and carried down through subcutaneous tissue with electrocautery down to the quadricept fascia. This was then incisied sharply. The left rectus femoris muscle was identified. The requested biopsy size was at least 2 cm x 0.5 cm by pathology. To ensure enough specimen was removed, approximately 3 x 1 cm swath of muscle was isolated. This was excised sharply, tieing proximal/distal ends in situ with 3-0 vicryl suture. electrocautery was then necessary for hemostasis. The specimen was walked to pathology by the OR team - pathology then confirmed enough specimen and of adequate quality. The wound was then irrigated with sterile saline and hemostasis verified. The fascia was then approximated with a running 2-0 vicryl suture. Deep dermal 3-0 vicryl sutures were used to close the subcutaneous space and a 4-0 monocryl running suture to close the skin. Dermabond was applied.  Attention was turned to punch biopsy of the right chest wall skin. The erythematous skin lesions were noted; the most intense appearing one was then punch biopsied with a 4 mm punch biopsy tool. Local anesthetic was infiltrated. This was passed off as specimen. Hemostasis verified. A single interrupted 4-0 monocryl subcuticular suture was placed to close this. Dermabond was then applied.  He was then awakened from anesthesia, extubated and transferred to a stretcher for transport to PACU in satisfactory condition.

## 2019-02-20 NOTE — Discharge Instructions (Signed)

## 2019-02-20 NOTE — Anesthesia Procedure Notes (Signed)
Procedure Name: LMA Insertion Date/Time: 02/20/2019 10:11 AM Performed by: Bryson Corona, CRNA Pre-anesthesia Checklist: Patient identified, Emergency Drugs available, Suction available and Patient being monitored Patient Re-evaluated:Patient Re-evaluated prior to induction Oxygen Delivery Method: Circle System Utilized Preoxygenation: Pre-oxygenation with 100% oxygen Induction Type: IV induction LMA: LMA inserted LMA Size: 5.0 Number of attempts: 1 Airway Equipment and Method: Bite block Placement Confirmation: positive ETCO2 Tube secured with: Tape Dental Injury: Teeth and Oropharynx as per pre-operative assessment

## 2019-02-20 NOTE — Progress Notes (Signed)
Subjective No acute events. Feeling well today. NPO since midnight  Objective: Vital signs in last 24 hours: Temp:  [97.5 F (36.4 C)-98 F (36.7 C)] 97.6 F (36.4 C) (05/12 0434) Pulse Rate:  [46-54] 51 (05/12 0434) Resp:  [12-20] 20 (05/12 0434) BP: (110-154)/(44-72) 110/44 (05/12 0434) SpO2:  [97 %-99 %] 97 % (05/12 0434) Weight:  [84.1 kg] 84.1 kg (05/12 0436) Last BM Date: 02/19/19  Intake/Output from previous day: 05/11 0701 - 05/12 0700 In: 3678.1 [P.O.:540; I.V.:3138.1] Out: 3200 [Urine:3200] Intake/Output this shift: No intake/output data recorded.  Gen: NAD, comfortable CV: RRR Pulm: Normal work of breathing Abd: Soft, NT/ND Skin: Left chest wall with small patches of red skin - asked for punch bx here - marked Ext: warm; left lower extremity marked for surgery - no cellulitis or skin changes on left anterior thigh; no groin yeast infections  Lab Results: CBC  Recent Labs    02/19/19 0701 02/20/19 0446  WBC 4.9 9.4  HGB 13.5 13.9  HCT 40.3 41.1  PLT 186 238   BMET Recent Labs    02/19/19 0701 02/20/19 0446  NA 140 138  K 4.0 3.9  CL 102 105  CO2 28 25  GLUCOSE 100* 119*  BUN 8 12  CREATININE 0.63 0.67  CALCIUM 8.9 9.2   PT/INR No results for input(s): LABPROT, INR in the last 72 hours. ABG No results for input(s): PHART, HCO3 in the last 72 hours.  Invalid input(s): PCO2, PO2  Studies/Results:  Anti-infectives: Anti-infectives (From admission, onward)   Start     Dose/Rate Route Frequency Ordered Stop   02/20/19 0816  ceFAZolin (ANCEF) 2-4 GM/100ML-% IVPB    Note to Pharmacy:  Bobbie Stack   : cabinet override      02/20/19 0816 02/20/19 2029   02/19/19 0600  ceFAZolin (ANCEF) IVPB 2g/100 mL premix     2 g 200 mL/hr over 30 Minutes Intravenous On call to O.R. 02/18/19 1902 02/20/19 0559       Assessment/Plan: Patient Active Problem List   Diagnosis Date Noted  . Rhabdomyolysis 02/16/2019  . Weakness 02/16/2019  . Elevated PSA  02/25/2017  . Post PTCA 04/16/2016  . CAD (coronary artery disease), native coronary artery 07/13/2015  . S/P PTCA (percutaneous transluminal coronary angioplasty) 07/08/2015  . History of colonic polyps 10/15/2014  . Hemiparesis ([place]) 09/18/2014  . BPH (benign prostatic hyperplasia) 09/18/2014  . Transaminitis 09/18/2014  . Hyperlipidemia   . Bradycardia    Mr. Berling is a very pleasant [phone] with hx of CAD, HLD, BPH admitted with progressively worsening muscle weakness and rhabdomyolysis; additionally skin lesions/rash on chest wall  -We have been asked by neurology for biopsy of left quadricept muscle and additionally a punch biopsy of the patches of red skin on right chest wall. Both sites have been marked. -The relevant anatomy and physiology was discussed at length with the patient.  -We discussed the reason for the procedures being diagnostic as requested by neurology -We discussed left quadricept muscle biopsy and punch biopsy of right chest wall skin -The planned procedure, material risks (including, but not limited to, pain, bleeding, infection, scarring, nondiagnostic biopsies and potential need for additional biopsies/procedures, damage to surrounding structures- blood vessels/nerves, need for additional procedures, worsening of pre-existing medical conditions, pneumonia, heart attack, stroke, death) benefits and alternatives to surgery were discussed at length. The patient's questions were answered to his satisfaction, he voiced understanding and they elected to proceed with surgery. Additionally, we discussed typical postoperative  expectations and the recovery process. -He requested we update his wife following procedure on the phone Olin Hauser - [phone]   LOS: 4 days   Sharon Mt. Dema Severin, M.D. [facility]

## 2019-02-20 NOTE — Plan of Care (Signed)

## 2019-02-20 NOTE — Anesthesia Postprocedure Evaluation (Signed)
Anesthesia Post Note  Patient: Stanley Boyd  Procedure(s) Performed: LEFT QUADRICEP MUSCLE  BIOPSY AND PUNCH BIOPSY OF RIGHT CHEST (N/A Leg Upper)     Patient location during evaluation: PACU Anesthesia Type: General Level of consciousness: awake and alert, patient cooperative and oriented Pain management: pain level controlled Vital Signs Assessment: post-procedure vital signs reviewed and stable Respiratory status: spontaneous breathing, nonlabored ventilation and respiratory function stable Cardiovascular status: blood pressure returned to baseline and stable Postop Assessment: no apparent nausea or vomiting Anesthetic complications: no    Last Vitals:  Vitals:   02/20/19 1220 02/20/19 1223  BP: 120/64 122/64  Pulse: (!) 54 (!) 53  Resp: 18   Temp: (!) 36.3 C   SpO2: 93%     Last Pain:  Vitals:   02/20/19 1220  TempSrc: Oral  PainSc:                  Cattleya Dobratz,E. Arius Harnois

## 2019-02-20 NOTE — Progress Notes (Signed)
Patient oob to geri chair today shortly after surgical procedure denied pain or distress. This evening has been ambulated with SBA only the entire unit without distress balance stable no mis steps noted gait steady. Md called talked with Probation officer and patient reported would discharge patient home today his vs are stable.

## 2019-02-20 NOTE — Progress Notes (Signed)
Patient taken down via Stretcher to OR for surgical procedure has been NPO since midnight. No distress noted.

## 2019-02-20 NOTE — Transfer of Care (Signed)
Immediate Anesthesia Transfer of Care Note  Patient: Stanley Boyd  Procedure(s) Performed: LEFT QUADRICEP MUSCLE  BIOPSY AND PUNCH BIOPSY OF RIGHT CHEST (N/A Leg Upper)  Patient Location: PACU  Anesthesia Type:General  Level of Consciousness: drowsy  Airway & Oxygen Therapy: Patient Spontanous Breathing and Patient connected to face mask oxygen  Post-op Assessment: Report given to RN and Post -op Vital signs reviewed and stable  Post vital signs: Reviewed and stable  Last Vitals:  Vitals Value Taken Time  BP 124/68 02/20/2019 11:15 AM  Temp 36.1 C 02/20/2019 11:15 AM  Pulse 77 02/20/2019 11:16 AM  Resp 13 02/20/2019 11:16 AM  SpO2 94 % 02/20/2019 11:16 AM  Vitals shown include unvalidated device data.  Last Pain:  Vitals:   02/20/19 0831  TempSrc:   PainSc: 0-No pain      Patients Stated Pain Goal: 4 (91/69/45 0388)  Complications: No apparent anesthesia complications

## 2019-02-20 NOTE — Progress Notes (Signed)
Discharge instructions given to patient he verbalized understanding of instructions given. PIV remove with catheter intact dressing applied to area with pressure. Patient escorted out in wheelchair to his vehicle to care of his wife.

## 2019-02-20 NOTE — Progress Notes (Signed)
Report received from Kendall, RN 3 E.

## 2019-02-20 NOTE — Telephone Encounter (Signed)
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 Posey Pronto to be placed under polymyositis. Patient has pending muscle biopsy. We also discussed potential rheumatology referral- will wait on biopsy and Dr. Serita Grit opinion first- but strongly consider rheumatology referral. Neurohospitalist wanted EMG to be considered.

## 2019-02-20 NOTE — Discharge Summary (Signed)
Physician Discharge Summary  Stanley Boyd ZWC:[id number] DOB: 06-17-1951  PCP: Marin Olp, MD  Admit date: 02/16/2019 Discharge date: 02/20/2019  Recommendations for Outpatient Follow-up:  1. Dr. Garret Reddish in 1 week with repeat labs (CBC, CMP and CK) 2. Dr. Narda Amber, [facility] in 2 weeks.  Ambulatory referral sent.  Neuro Hospitalist has also communicated with Dr. Posey Pronto. 3. Dr. Jarvis Morgan, Cardiology  Home Health: PT Equipment/Devices: None  Discharge Condition: Improved and stable CODE STATUS: Full Diet recommendation: Heart healthy diet.  Discharge Diagnoses:  Principal Problem:   Weakness Active Problems:   Hyperlipidemia   Bradycardia   Hemiparesis (HCC)   BPH (benign prostatic hyperplasia)   Transaminitis   Rhabdomyolysis   Brief Summary: 68 year old male with PMH of CAD status post stents, HLD, chronic back pain from multiple surgeries, reported upper body weakness and chronic right-sided weakness since the fall in 2000 presented with approximately 1 month history of progressive weakness of his lower extremities, difficult to ambulate and sustained multiple falls.  This was associated with some weight loss possibly from poor oral intake and rash on his chest and back.  Seen by PCP, noted elevated CK level and recommended hospitalization.  Admitted for proximal muscle weakness and suspected polymyositis.  Neurology consulted.  Extensive work-up thus far negative.  Underwent skin and muscle biopsy by general surgery 5/12.  Started high-dose prednisone 5/11.   Assessment & Plan:  Progressive proximal muscle weakness/rhabdomyolysis, possible polymyositis:   Neurology was consulted and assisted with evaluation and management.  As per neurology input 5/11, he has had progressive proximal greater than distal muscle weakness over the past 4 weeks with MRI and lab findings concerning for polymyositis.  They think that Eaton-Lambert or Ethelene Hal are  much less likely due to preserved reflexes.  The rash that he has also is not characteristic of dermatomyositis.   At baseline patient does have right-sided weakness and upper extremity weakness related to prior fall and C-spine injury.  Work-up is as outlined below: ? CK: 14,781 on admission which peaked to 17, 630, down to 11, 953 today.  Myoglobin 5449 > 468, aldolase 17.1 ? CRP 1.1, ESR 18 >12 ? Free T4: 0.78, TSH 4.86. ? B12: 162 ? Borrelia burgdorferi Ab IgG + IgM negative/<0.91 ? Acute hepatitis panel negative. ? HIV antibody screen negative. ? ANA, anti-Jo and scleroderma antibodies negative. ? Voltage-gated calcium channel antibody (VGCC) pending ? Serum paraneoplastic panel: Pending ? Ehrlichiosis antibodies negative ? RMSF work-up pending. ? CT chest abdomen and pelvis: No CT evidence of malignancy in chest, abdomen or pelvis.  Innumerable tiny centrilobular pulmonary nodules apically (see discussion below) ? MRI of left femur with and without contrast ? 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  Solid anterior fusion at C4-5 and C5-6 without spinal stenosis at these levels.  Degenerative changes with moderate spinal stenosis at C6-7, mild to moderate spinal stenosis at C3-4 and mild to moderate neural foraminal stenosis at both levels. ? MRI lumbar spine: Relatively diffuse lumbar disc and facet degeneration without significant spinal stenosis.  Normal appearance of conus medullaris and cauda equina. ? MRI thoracic spine: Mild thoracic disc and facet degeneration without evidence of neural impingement.  Minimal prominence of the central canal of the mid to lower thoracic spinal cord without syrinx. ? MRI left femur with and without contrast: Abnormal diffuse muscular edema and enhancement involving various muscles of the lower pelvis and thigh-grossly symmetric process.  DD: Polymyositis, dermatomyositis, connective tissue  disease associated myositis, other inflammatory  myopathies, acute or subacute autoimmune neuropathy, rhabdomyolysis, vasculitis associated myopathy ? General surgery was consulted and patient underwent open biopsy of left rectus femoris muscle and punch biopsy of right chest wall skin/site of rash on 5/12: Follow pathology results.  Neurology started patient on prednisone 1 mg/kg/day (80 mg daily) on 5/11.  Monitor response.  PT recommends home health on 5/9  I discussed in detail with Dr. Leonel Ramsay, Neuro Hospitalist who cleared patient for discharge home.  He recommended prednisone 80 mg daily, may not see effect of steroids for several days, start to taper after 4 to 6 weeks; PPI for GI prophylaxis, he discussed with Dr. Narda Amber, Neuromuscular specialist who recommended no need for rheumatology consultation as outpatient, add calcium supplements and Bactrim DS 1 tablet 3 times a week while on prednisone.  I discussed in detail with patient who is anxious for discharge home.  He reports that he has been ambulating steadily in the room without complaints.  He lives with his wife who is retired and able to assist him at home.  I also discussed with his physician son who is agreeable for discharge plans today.  Follow LFTs and muscle enzymes closely as outpatient.  Rhabdomyolysis:  ? Etiology unknown but likely part of the proximal muscle weakness pathology.  Evaluation and management as above.   ? Some improvement.  Monitor with steroid initiation.   ? Follow muscle biopsy results. ? He was also treated with IV fluids aggressively.  Hyperlipidemia:       -   As per cardiology follow-up, lipids not controlled on Praluent and will be           addressed once medically stable. Praluent currently on hold.  As per            neurology, Praluent unlikely to have caused myopathy as patient has been            on it for almost a year.  Also discontinued fish oil at discharge.  Coronary artery disease status post PCI 06/2015.             -  No chest pain.  Cardiology input appreciated.  Continue aspirin.  TTE              shows no wall motion abnormalities and has normal EF.      Acute transaminitis          -Likely related to muscle injury.  Acute hepatitis panel negative.  Slowly           improving.    Follow LFTs closely as outpatient.      BPH          -Continue Flomax.  Abnormal TFTs           -TFTs as above.  Possible mild hypothyroidism.  Started on low-dose            Synthroid.  Follow TFTs in 4 weeks.  B12 deficiency           -Started oral B12 supplements.  Recheck B12 levels in a couple of months           and if persistently low then needs further evaluation and parenteral B12.  Pulmonary nodules seen on CT           -As per report, innumerable tiny centrilobular pulmonary nodules in and            apically predominant distribution, which may  reflect sequelae of atypical            infection or smoking-related respiratory bronchiolitis.            -May consider follow-up CT in a couple of months as outpatient.  Patient             and physicians are not aware.                                                                                                               Consultants:  Neurology General surgery Cardiology  Procedures:  Open biopsy of left rectus femoris muscle and punch biopsy of right chest wall skin on 5/12 by general surgery  Discharge Instructions  Discharge Instructions    Ambulatory referral to Neurology   Complete by:  As directed    An appointment is requested in approximately: 2 weeks   Call MD for:   Complete by:  As directed    Worsening leg weakness or falls.   Call MD for:  extreme fatigue   Complete by:  As directed    Call MD for:  persistant dizziness or light-headedness   Complete by:  As directed    Call MD for:  redness, tenderness, or signs of infection (pain, swelling, redness, odor or green/yellow discharge around incision site)   Complete by:  As  directed    Call MD for:  temperature >100.4   Complete by:  As directed    Diet - low sodium heart healthy   Complete by:  As directed    Increase activity slowly   Complete by:  As directed        Medication List    STOP taking these medications   acetaminophen 500 MG tablet Commonly known as:  TYLENOL   FISH OIL PO   Praluent 150 MG/ML Sopn Generic drug:  Alirocumab     TAKE these medications   amoxicillin 500 MG capsule Commonly known as:  AMOXIL Take 2,000 mg by mouth See admin instructions. Take 4 capsules (2000 mg) by mouth one hours prior to dental appointments   aspirin 81 MG chewable tablet Chew 1 tablet (81 mg total) by mouth at bedtime.   calcium-vitamin D 500-200 MG-UNIT tablet Commonly known as:  OSCAL WITH D Take 1 tablet by mouth 3 (three) times daily.   finasteride 5 MG tablet Commonly known as:  PROSCAR Take 5 mg by mouth at bedtime.   fluticasone 50 MCG/ACT nasal spray Commonly known as:  FLONASE Place 2 sprays into both nostrils daily as needed for allergies or rhinitis.   levothyroxine 25 MCG tablet Commonly known as:  SYNTHROID Take 0.5 tablets (12.5 mcg total) by mouth daily before breakfast.   nitroGLYCERIN 0.4 MG SL tablet Commonly known as:  Nitrostat Place 1 tablet (0.4 mg total) under the tongue every 5 (five) minutes as needed for chest pain.   pantoprazole 40 MG tablet Commonly known as:  PROTONIX Take 1 tablet (40 mg total) by mouth daily. Start taking on:  Feb 21, 2019   predniSONE 20 MG tablet Commonly known as:  DELTASONE Take 4 tablets (80 mg total) by mouth daily with breakfast. Start taking on:  Feb 21, 2019   REFRESH OP Place 1 drop into both eyes daily.   sulfamethoxazole-trimethoprim 800-160 MG tablet Commonly known as:  BACTRIM DS Take 1 tablet by mouth 3 (three) times a week. Start taking on:  Feb 21, 2019   tamsulosin 0.4 MG Caps capsule Commonly known as:  FLOMAX TAKE 1 CAPSULE BY MOUTH ONCE DAILY WITH  SUPPER What changed:    how much to take  how to take this  when to take this  additional instructions   vitamin B-12 1000 MCG tablet Commonly known as:  CYANOCOBALAMIN Take 1 tablet (1,000 mcg total) by mouth daily.      Follow-up Information    Marin Olp, MD. Schedule an appointment as soon as possible for a visit in 1 week(s).   Specialty:  Family Medicine Why:  To be seen with repeat labs (CBC, CMP, CK). Contact information: [place] [address] [phone]        Alda Berthold, DO. Schedule an appointment as soon as possible for a visit in 2 week(s).   Specialty:  Neurology Contact information: [place] [id number] [phone]        Adrian Prows, MD. Schedule an appointment as soon as possible for a visit.   Specialty:  Cardiology Contact information: [place] [address] [phone]          No Known Allergies    Procedures/Studies: Dg Lumbar Spine Complete  Result Date: 02/15/2019 CLINICAL DATA:  Lower extremity weakness. EXAM: LUMBAR SPINE - COMPLETE 4+ VIEW COMPARISON:  CT and pelvis dated 03/03/2018 FINDINGS: There is no displaced fracture. Degenerative changes are noted of the lumbar spine with moderate to severe disc height loss at the L5-S1 level. There is facet arthrosis of the lower lumbar segments. Atherosclerotic changes are noted of the abdominal aorta. A phlebolith projects over the patient's left hemipelvis. A moderate amount of stool is noted in the colon. IMPRESSION: 1. No acute osseous abnormality. 2. Multilevel degenerative changes, greatest at the L5-S1 level. 3. Atherosclerotic changes of the abdominal aorta. Electronically Signed   By: Constance Holster M.D.   On: 02/15/2019 17:38   Ct Chest W Contrast  Result Date: 02/17/2019 CLINICAL DATA:  Generalized muscle weakness, evaluate for malignant etiology EXAM: CT CHEST, ABDOMEN, AND PELVIS  WITH CONTRAST TECHNIQUE: Multidetector CT imaging of the chest, abdomen and pelvis was performed following the standard protocol during bolus administration of intravenous contrast. CONTRAST:  159m OMNIPAQUE IOHEXOL 300 MG/ML  SOLN COMPARISON:  CT abdomen pelvis, 03/03/2018, CT chest, 10/12/2014 FINDINGS: CT CHEST FINDINGS Cardiovascular: Extensive 3 vessel coronary artery calcifications and/or stents. Normal heart size. No pericardial effusion. Mediastinum/Nodes: No enlarged mediastinal, hilar, or axillary lymph nodes. Thyroid gland, trachea, and esophagus demonstrate no significant findings. Lungs/Pleura: Multiple small benign calcified pulmonary nodules present bilaterally. Innumerable tiny centrilobular pulmonary nodules in an apically predominant distribution, which may reflect sequelae of atypical infection or smoking-related respiratory bronchiolitis. There is an unchanged, benign somewhat bandlike consolidation in the right lung base. No pleural effusion or pneumothorax. Musculoskeletal: No chest wall mass or suspicious bone lesions identified. CT ABDOMEN PELVIS FINDINGS Hepatobiliary: No focal liver abnormality is seen. No gallstones, gallbladder wall thickening, or biliary dilatation. Pancreas: Unremarkable. No pancreatic ductal dilatation or surrounding  inflammatory changes. Spleen: Normal in size without focal abnormality. Adrenals/Urinary Tract: Adrenal glands are unremarkable. Kidneys are normal, without renal calculi, focal lesion, or hydronephrosis. Bladder is unremarkable. Stomach/Bowel: Stomach is within normal limits. Appendix appears normal. No evidence of bowel wall thickening, distention, or inflammatory changes. Vascular/Lymphatic: Severe mixed calcific atherosclerosis of the abdominal aorta and branch vessels. No enlarged abdominal or pelvic lymph nodes. Reproductive: Prostatomegaly. Other: No abdominal wall hernia or abnormality. No abdominopelvic ascites. Musculoskeletal: No acute or  significant osseous findings. IMPRESSION: 1.  No CT evidence of malignancy in the chest, abdomen, or pelvis. 2. Innumerable tiny centrilobular pulmonary nodules in an apically predominant distribution, which may reflect sequelae of atypical infection or smoking-related respiratory bronchiolitis. There is an unchanged, benign somewhat bandlike consolidation in the right lung base. 3.  Coronary artery disease. 4.  Atherosclerosis. 5.  Prostatomegaly. Electronically Signed   By: Eddie Candle M.D.   On: 02/17/2019 16:25   Mr Cervical Spine Wo Contrast  Result Date: 02/17/2019 CLINICAL DATA:  Myelopathy. Progressive lower extremity weakness. Multiple falls. 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 EXAM: MRI CERVICAL SPINE WITHOUT CONTRAST TECHNIQUE: Multiplanar, multisequence MR imaging of the cervical spine was performed. No intravenous contrast was administered. COMPARISON:  None. FINDINGS: Alignment: Minimal retrolisthesis of C3 on C4. Vertebrae: C4-5 and C5-6 interbody osseous fusion with anterior fusion plate and screws in place at C4-5. No fracture, suspicious osseous lesion, or significant marrow edema. Chronic degenerative endplate changes at A1-2 greater than C3-4 associated with disc space narrowing. Cord: Well-defined T2 hyperintensity in the cord bilaterally at C4 and C5 with mild cord volume loss more notable at C4 consistent with myelomalacia. Posterior Fossa, vertebral arteries, paraspinal tissues: Dominant appearing left vertebral artery with preserved flow void. Poor visualization of the right vertebral artery which is likely hypoplastic. Unremarkable included portion of the posterior fossa. Disc levels: C2-3: Minimal disc bulging and moderate left facet arthrosis without stenosis. C3-4: Disc bulging and uncovertebral spurring result in mild-to-moderate spinal stenosis and moderate right and mild-to-moderate left neural foraminal stenosis. C4-5: Anterior fusion.  No  significant stenosis. C5-6: Anterior fusion. Right-sided osteophytic ridging results in mild right neural foraminal stenosis. Patent spinal canal and left neural foramen. C6-7: Broad-based posterior disc osteophyte complex results in moderate spinal stenosis and moderate right and mild left neural foraminal stenosis. C7-T1: Negative. IMPRESSION: 1. 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 2. Solid anterior fusion at C4-5 and C5-6 without spinal stenosis at these levels. 3. Adjacent segment degenerative changes with moderate spinal stenosis at C6-7, mild-to-moderate spinal stenosis at C3-4, and mild-to-moderate neural foraminal stenosis at both levels. Electronically Signed   By: Logan Bores M.D.   On: 02/17/2019 12:17   Mr Thoracic Spine Wo Contrast  Result Date: 02/17/2019 CLINICAL DATA:  Myelopathy. Progressive lower extremity weakness. Multiple falls. 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 EXAM: MRI THORACIC SPINE WITHOUT CONTRAST TECHNIQUE: Multiplanar, multisequence MR imaging of the thoracic spine was performed. No intravenous contrast was administered. COMPARISON:  Chest CT 10/31/2015 FINDINGS: Alignment:  Normal. Vertebrae: No fracture, suspicious osseous lesion, or significant marrow edema. Small hemangioma in the T8 vertebral body. Cord: Slight prominence of the central canal of the spinal cord in the mid to lower thoracic spine most notably at T9-10. No frank syrinx formation. Normal cord signal elsewhere. Paraspinal and other soft tissues: Partially visualized 1.4 cm T2 hyperintense lesion in the upper pole of the right kidney as well as a subcentimeter T2 hyperintensity in the upper  pole of the left kidney, likely cysts but incompletely evaluated. Trace bilateral pleural fluid. Disc levels: Mild disc bulging at T1-2, T2-3, and T5-6. Tiny left paracentral disc protrusions at T6-7 and T7-8. No spinal stenosis or cord compression. Mild facet arthrosis in the upper and lower thoracic  spine without evidence of compressive neural foraminal stenosis. IMPRESSION: 1. Mild thoracic disc and facet degeneration without evidence of neural impingement. 2. Minimal prominence of the central canal of the mid to lower thoracic spinal cord without syrinx. Electronically Signed   By: Logan Bores M.D.   On: 02/17/2019 12:36   Mr Lumbar Spine Wo Contrast  Result Date: 02/17/2019 CLINICAL DATA:  Myelopathy. Progressive lower extremity weakness. Multiple falls. 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 EXAM: MRI LUMBAR SPINE WITHOUT CONTRAST TECHNIQUE: Multiplanar, multisequence MR imaging of the lumbar spine was performed. No intravenous contrast was administered. COMPARISON:  Lumbar spine radiographs 02/15/2019. CT abdomen and pelvis 03/03/2018. FINDINGS: Segmentation:  Standard. Alignment:  Normal. Vertebrae: No fracture or suspicious osseous lesion. Moderate degenerative endplate changes at Z3-G6, predominantly Modic type 2. Conus medullaris and cauda equina: Conus extends to the T12-L1 level. Conus and cauda equina appear normal. Paraspinal and other soft tissues: Unremarkable. Disc levels: Disc desiccation throughout the lumbar spine with exception of L1-2. Moderate to severe disc space narrowing at L5-S1 with milder narrowing at L2-3, L3-4, and L4-5. L1-2: Normal disc.  Slight facet hypertrophy without stenosis. L2-3: Circumferential disc bulging and mild facet and ligamentum flavum hypertrophy without significant stenosis. L3-4: Circumferential disc bulging, a small right foraminal to extraforaminal disc protrusion, and mild facet and ligamentum flavum hypertrophy result in borderline lateral recess stenosis and mild bilateral neural foraminal stenosis with the disc protrusion potentially affecting the exiting right L3 nerve root. No significant spinal stenosis. L4-5: Circumferential disc bulging, a right foraminal to extraforaminal disc osteophyte complex, and mild facet and  ligamentum flavum hypertrophy result in mild right neural foraminal stenosis with the disc osteophyte complex potentially affecting the right L4 nerve lateral to the foramen. No significant spinal stenosis. L5-S1: Prior left laminectomy. Broad central disc protrusion, disc bulging asymmetric to the left, and mild facet hypertrophy result in mild right and moderate left neural foraminal stenosis without spinal stenosis. There is likely some epidural fibrosis in the left lateral recess. IMPRESSION: 1. Relatively diffuse lumbar disc and facet degeneration without significant spinal stenosis. 2. Multilevel neural foraminal stenosis, moderate on the left at L5-S1. 3. Normal appearance of the conus medullaris and cauda equina. Electronically Signed   By: Logan Bores M.D.   On: 02/17/2019 12:28   Ct Abdomen Pelvis W Contrast  Result Date: 02/17/2019 CLINICAL DATA:  Generalized muscle weakness, evaluate for malignant etiology EXAM: CT CHEST, ABDOMEN, AND PELVIS WITH CONTRAST TECHNIQUE: Multidetector CT imaging of the chest, abdomen and pelvis was performed following the standard protocol during bolus administration of intravenous contrast. CONTRAST:  130m OMNIPAQUE IOHEXOL 300 MG/ML  SOLN COMPARISON:  CT abdomen pelvis, 03/03/2018, CT chest, 10/12/2014 FINDINGS: CT CHEST FINDINGS Cardiovascular: Extensive 3 vessel coronary artery calcifications and/or stents. Normal heart size. No pericardial effusion. Mediastinum/Nodes: No enlarged mediastinal, hilar, or axillary lymph nodes. Thyroid gland, trachea, and esophagus demonstrate no significant findings. Lungs/Pleura: Multiple small benign calcified pulmonary nodules present bilaterally. Innumerable tiny centrilobular pulmonary nodules in an apically predominant distribution, which may reflect sequelae of atypical infection or smoking-related respiratory bronchiolitis. There is an unchanged, benign somewhat bandlike consolidation in the right lung base. No pleural effusion  or pneumothorax.  Musculoskeletal: No chest wall mass or suspicious bone lesions identified. CT ABDOMEN PELVIS FINDINGS Hepatobiliary: No focal liver abnormality is seen. No gallstones, gallbladder wall thickening, or biliary dilatation. Pancreas: Unremarkable. No pancreatic ductal dilatation or surrounding inflammatory changes. Spleen: Normal in size without focal abnormality. Adrenals/Urinary Tract: Adrenal glands are unremarkable. Kidneys are normal, without renal calculi, focal lesion, or hydronephrosis. Bladder is unremarkable. Stomach/Bowel: Stomach is within normal limits. Appendix appears normal. No evidence of bowel wall thickening, distention, or inflammatory changes. Vascular/Lymphatic: Severe mixed calcific atherosclerosis of the abdominal aorta and branch vessels. No enlarged abdominal or pelvic lymph nodes. Reproductive: Prostatomegaly. Other: No abdominal wall hernia or abnormality. No abdominopelvic ascites. Musculoskeletal: No acute or significant osseous findings. IMPRESSION: 1.  No CT evidence of malignancy in the chest, abdomen, or pelvis. 2. Innumerable tiny centrilobular pulmonary nodules in an apically predominant distribution, which may reflect sequelae of atypical infection or smoking-related respiratory bronchiolitis. There is an unchanged, benign somewhat bandlike consolidation in the right lung base. 3.  Coronary artery disease. 4.  Atherosclerosis. 5.  Prostatomegaly. Electronically Signed   By: Eddie Candle M.D.   On: 02/17/2019 16:25   Mr Femur Left W Wo Contrast  Result Date: 02/18/2019 CLINICAL DATA:  Progressive lower extremity and proximal muscle weakness. Elevated CK levels and elevated liver function tests. EXAM: MR OF THE LEFT LOWER EXTREMITY WITHOUT AND WITH CONTRAST TECHNIQUE: Multiplanar, multisequence MR imaging of the left thigh was performed both before and after administration of intravenous contrast. CONTRAST:  8 cc Gadavist COMPARISON:  None. FINDINGS:  Bones/Joint/Cartilage Unremarkable Ligaments N/A Muscles and Tendons There is abnormal diffuse muscle edema and enhancement primarily involving the gluteus maximus, gluteus medius, hip adductor musculature, hamstring musculature, tensor fascia lata, iloipsoas, and medial head gastrocnemius muscles. Coronal images reveal is to be a roughly symmetric process. Much lesser degree of involvement in the gluteus maximus, vastus intermedius, and vastus lateralis muscles with only focal low-grade activity in these muscle groups. No abscess or discrete fluid collection is identified. Soft tissues No overt cutaneous or subcutaneous edema identified. IMPRESSION: 1. Abnormal diffuse muscular edema and enhancement involving various muscles of the lower pelvis and thigh as detailed above. The coronal images indicate that this is a grossly symmetric process. I note that there is a plan for quadriceps muscle biopsy; I would recommend targeting the rectus femoris muscle and referring to today's MRI images given that the other quadriceps muscles demonstrate only minimal involvement. 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 Electronically Signed   By: Van Clines M.D.   On: 02/18/2019 11:41   Dg Chest Port 1v Same Day  Result Date: 02/17/2019 CLINICAL DATA:  Dyspnea. EXAM: PORTABLE CHEST 1 VIEW COMPARISON:  CT scan of October 31, 2015. FINDINGS: The heart size and mediastinal contours are within normal limits. Both lungs are clear. No pneumothorax or pleural effusion is noted. The visualized skeletal structures are unremarkable. IMPRESSION: No active disease. Electronically Signed   By: Marijo Conception M.D.    On: 02/17/2019 09:06      Subjective: Patient seen after procedure.  Reports feeling somewhat groggy.  Reports chronic upper extremity weakness and unable to raise upper extremities above shoulder level consistently to do activities such as painting or lifting objects.  He said his  legs were stronger until couple of weeks ago.  Since yesterday feels like he has more energy but unable to say if lower extremity strength has improved or not.  Reports ambulating in the room steadily without complaints.  Discharge Exam:  Vitals:   02/20/19 1130 02/20/19 1145 02/20/19 1220 02/20/19 1223  BP: 138/63 111/65 120/64 122/64  Pulse: 67 (!) 57 (!) 54 (!) 53  Resp: '19 18 18   '$ Temp:  (!) 97 F (36.1 C) (!) 97.3 F (36.3 C)   TempSrc:   Oral   SpO2: 95% 95% 93%   Weight:      Height:        General exam: Does not middle-age male, moderately built and nourished lying comfortably supine in bed.  Oral mucosa moist. Respiratory system: Clear to auscultation. Respiratory effort normal. Cardiovascular system: S1 & S2 heard, RRR. No JVD, murmurs, rubs, gallops or clicks. No pedal edema.  Telemetry personally reviewed: Sinus bradycardia mostly in the 50s, occasionally in the 40s and even sinus rhythm. Gastrointestinal system: Abdomen is nondistended, soft and nontender. No organomegaly or masses felt. Normal bowel sounds heard. Central nervous system: Alert and oriented. No focal neurological deficits. Extremities: Bilateral lower extremities grade 3 x 5 power proximally and 4 x 5 power distally.  Bilateral upper extremity grade 4 x 5 power.  Left thigh biopsy site and right upper anterior chest biopsy site without acute findings. Skin: No rashes, lesions or ulcers Psychiatry: Judgement and insight appear normal. Mood & affect appropriate.     The results of significant diagnostics from this hospitalization (including imaging, microbiology, ancillary and laboratory) are listed below for reference.      Microbiology: Recent Results (from the past 240 hour(s))  SARS Coronavirus 2 (CEPHEID - Performed in [facility] hospital lab), Hosp Order     Status: None   Collection Time: 02/16/19  4:21 PM  Result Value Ref Range Status   SARS Coronavirus 2 NEGATIVE NEGATIVE Final    Comment: (NOTE) If result is NEGATIVE SARS-CoV-2 target nucleic acids are NOT DETECTED. The SARS-CoV-2 RNA is generally detectable in upper and lower  respiratory specimens during the acute phase of infection. The lowest  concentration of SARS-CoV-2 viral copies this assay can detect is 250  copies / mL. A negative result does not preclude SARS-CoV-2 infection  and should not be used as the sole basis for treatment or other  patient management decisions.  A negative result may occur with  improper specimen collection / handling, submission of specimen other  than nasopharyngeal swab, presence of viral mutation(s) within the  areas targeted by this assay, and inadequate number of viral copies  (<250 copies / mL). A negative result must be combined with clinical  observations, patient history, and epidemiological information. If result is POSITIVE SARS-CoV-2 target nucleic acids are DETECTED. The SARS-CoV-2 RNA is generally detectable in upper and lower  respiratory specimens dur ing the acute phase of infection.  Positive  results are indicative of active infection with SARS-CoV-2.  Clinical  correlation with patient history and other diagnostic information is  necessary to determine patient infection status.  Positive results do  not rule out bacterial infection or co-infection with other viruses. If result is PRESUMPTIVE POSTIVE SARS-CoV-2 nucleic acids MAY BE PRESENT.   A presumptive positive result was obtained on the submitted specimen  and confirmed on repeat testing.  While 2019 novel coronavirus  (SARS-CoV-2) nucleic acids may be present in the submitted sample  additional confirmatory testing  may be  necessary for epidemiological  and / or clinical management purposes  to differentiate between  SARS-CoV-2 and other Sarbecovirus currently known to infect humans.  If clinically indicated additional testing with an alternate test  methodology [phone]) is advised. The SARS-CoV-2 RNA is generally  detectable in upper and lower respiratory sp ecimens during the acute  phase of infection. The expected result is Negative. Fact Sheet for Patients:  StrictlyIdeas.no Fact Sheet for Healthcare Providers: BankingDealers.co.za This test is not yet approved or cleared by the [place] FDA and has been authorized for detection and/or diagnosis of SARS-CoV-2 by FDA under an Emergency Use Authorization (EUA).  This EUA will remain in effect (meaning this test can be used) for the duration of the COVID-19 declaration under Section 564(b)(1) of the Act, 21 U.S.C. section 360bbb-3(b)(1), unless the authorization is terminated or revoked sooner. Performed at [facility], [place] [address], [place], [place] [address]   Surgical pcr screen     Status: Abnormal   Collection Time: 02/18/19 11:26 PM  Result Value Ref Range Status   MRSA, PCR NEGATIVE NEGATIVE Final   Staphylococcus aureus POSITIVE (A) NEGATIVE Final    Comment: CRITICAL RESULT CALLED TO, READ BACK BY AND VERIFIED WITH: RN, K. Darral Dash [phone] '@0320'$  THANEY Performed at [facility] [address] [place], [place] [address]      Labs: CBC: Recent Labs  Lab 02/15/19 1203 02/16/19 1400 02/17/19 0212 02/18/19 0042 02/19/19 0701 02/20/19 0446  WBC 4.5 4.9 4.6 3.6* 4.9 9.4  NEUTROABS 2.7 2.6  --   --   --   --   HGB 15.6 13.7 13.0 13.4 13.5 13.9  HCT 46.1 41.2 39.0 40.0 40.3 41.1  MCV 92.3 91.8 91.8 91.3 90.6 90.5  PLT 224.0 213 196 202 186 793   Basic Metabolic Panel: Recent Labs  Lab 02/16/19 2321 02/17/19 1438 02/18/19 0042 02/19/19 0701 02/20/19 0446  NA  139 138 135 140 138  K 3.8 3.9 3.8 4.0 3.9  CL 107 103 104 102 105  CO2 '27 27 25 28 25  '$ GLUCOSE 94 95 102* 100* 119*  BUN '10 10 9 8 12  '$ CREATININE 0.62 0.64 0.65 0.63 0.67  CALCIUM 8.6* 8.9 8.8* 8.9 9.2   Liver Function Tests: Recent Labs  Lab 02/16/19 2321 02/17/19 1438 02/18/19 0042 02/19/19 0701 02/20/19 0446  AST 475* 490* 454* 394* 381*  ALT 465* 493* 466* 445* 468*  ALKPHOS 52 57 60 59 61  BILITOT 0.5 0.4 0.5 0.6 0.6  PROT 5.9* 6.4* 6.0* 6.0* 6.3*  ALBUMIN 3.1* 3.4* 3.2* 3.1* 3.3*   BNP (last 3 results) No results for input(s): BNP in the last 8760 hours. Cardiac Enzymes: Recent Labs  Lab 02/17/19 0655  02/18/19 0042 02/18/19 1215 02/19/19 0012 02/19/19 0701 02/20/19 0446  CKTOTAL  --    < > 16,393* 15,260* 12,702* 12,575* 11,953*  TROPONINI 0.03*  --   --   --   --   --   --    < > = values in this interval not displayed.   Urinalysis    Component Value Date/Time   COLORURINE AMBER (A) 02/16/2019 1627   APPEARANCEUR CLOUDY (A) 02/16/2019 1627   APPEARANCEUR Clear 11/28/2017 0944   LABSPEC 1.023 02/16/2019 1627   PHURINE 5.0 02/16/2019 1627   GLUCOSEU NEGATIVE 02/16/2019 1627   HGBUR LARGE (A) 02/16/2019 1627   BILIRUBINUR NEGATIVE 02/16/2019 1627   BILIRUBINUR Negative 11/28/2017 0944   KETONESUR  NEGATIVE 02/16/2019 1627   PROTEINUR 100 (A) 02/16/2019 1627   NITRITE NEGATIVE 02/16/2019 [place] 02/16/2019 1627    I discussed in detail with patient's physician son via phone, updated care and answered all questions.  Time coordinating discharge: Over 40 minutes  SIGNED:  Vernell Leep, MD, FACP, [facility]. [facility]  To contact the attending provider between 7A-7P or the covering provider during after hours 7P-7A, please log into the web site [email or web address] and access using universal [place] password for that web site. If you do not have the password, please call the hospital operator.

## 2019-02-20 NOTE — Anesthesia Preprocedure Evaluation (Addendum)
Anesthesia Evaluation  Patient identified by MRN, date of birth, ID band Patient awake    Reviewed: Allergy & Precautions, NPO status , Patient's Chart, lab work & pertinent test results  History of Anesthesia Complications Negative for: history of anesthetic complications  Airway Mallampati: II  TM Distance: >3 FB Neck ROM: Full    Dental  (+) Dental Advisory Given   Pulmonary  02/17/2019 Covid-19 NEG    breath sounds clear to auscultation       Cardiovascular (-) angina+ CAD and + Cardiac Stents   Rhythm:Regular Rate:Normal  02/17/2019 ECHO: EF 60-65%, valves OK   Neuro/Psych  Headaches, Chronic back pain 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  Neuromuscular disease (progressive muscular weakness) negative psych ROS   GI/Hepatic negative GI ROS, Neg liver ROS, Elevated LFTs   Endo/Other  negative endocrine ROS  Renal/GU H/o stones     Musculoskeletal Rhabdomyolysis: Progressively worsening proximal muscle weakness   Abdominal   Peds  Hematology negative hematology ROS (+)   Anesthesia Other Findings   Reproductive/Obstetrics                            Anesthesia Physical Anesthesia Plan  ASA: III  Anesthesia Plan: General   Post-op Pain Management:    Induction: Intravenous  PONV Risk Score and Plan: 2 and Ondansetron and Dexamethasone  Airway Management Planned: LMA  Additional Equipment:   Intra-op Plan:   Post-operative Plan:   Informed Consent: I have reviewed the patients History and Physical, chart, labs and discussed the procedure including the risks, benefits and alternatives for the proposed anesthesia with the patient or authorized representative who has indicated his/her understanding and acceptance.     Dental advisory given  Plan Discussed with: CRNA and Surgeon  Anesthesia Plan Comments:        Anesthesia Quick Evaluation

## 2019-02-20 NOTE — Progress Notes (Signed)
Subjective: Biopsy today.   Exam: Vitals:   02/20/19 1220 02/20/19 1223  BP: 120/64 122/64  Pulse: (!) 54 (!) 53  Resp: 18   Temp: (!) 97.3 F (36.3 C)   SpO2: 93%    Gen: In bed, NAD Resp: non-labored breathing, no acute distress Abd: soft, nt  Neuro: MS: Awake, alert, interactive and appropriate, oriented x3 CN: EOMI, visual fields full, face symmetric Motor: He has mild proximal greater than distal weakness, with right side being slightly weaker than the left Sensory: Decreased to light touch in the right side (baseline since previous injury) DTR: 2+ and symmetric in the biceps and patella  Pertinent Labs: CRP borderline at 1.1 ESR normal at 12 ANA, anti-Jo 1, anti-scleroderma negative Voltage-gated calcium channel antibody pending  Impression: 68 year old male with progressive proximal greater than distal muscle weakness over the past 4 weeks with 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  With preserved reflexes, I think that Lambert-Eaton or Guillian-Barre (things that have been mentioned earlier in the hospitalization) are much less likely.  He will need to remain on prednisone for the time being. I discussed with pathology. They do not do any initial preparations in house, but send them out so there will be no initial pathology, I do not feel he needs to stay in house awaiting final results.   Recommendations: 1) prednisone 1 mg/kg/day(80 mg daily) 2) omeprazole, calcium supplement, bactrim 3) PT 4) f/u as outpatient with donika patel  Roland Rack, MD [facility] [phone]  If 7pm- 7am, please page neurology on call as listed in [place].

## 2019-02-21 ENCOUNTER — Encounter: Payer: Self-pay | Admitting: Neurology

## 2019-02-21 ENCOUNTER — Telehealth: Payer: Self-pay | Admitting: Neurology

## 2019-02-21 ENCOUNTER — Telehealth: Payer: Self-pay

## 2019-02-21 ENCOUNTER — Encounter (HOSPITAL_COMMUNITY): Payer: Self-pay | Admitting: Surgery

## 2019-02-21 LAB — MYOGLOBIN, SERUM: Myoglobin: 4712 ng/mL — ABNORMAL HIGH (ref 28–72)

## 2019-02-21 LAB — ALDOLASE: Aldolase: 122 U/L — ABNORMAL HIGH (ref 3.3–10.3)

## 2019-02-21 NOTE — Telephone Encounter (Signed)
Admit date: 02/16/2019 Discharge date: 02/20/2019  Recommendations for Outpatient Follow-up:  1. Dr. Garret Reddish in 1 week with repeat labs (CBC, CMP and CK) 2. Dr. Narda Amber, [facility] in 2 weeks.  Ambulatory referral sent.  Neuro Hospitalist has also communicated with Dr. Posey Pronto. 3. Dr. Jarvis Morgan, Cardiology  Home Health: PT Equipment/Devices: None  Discharge Condition: Improved and stable CODE STATUS: Full Diet recommendation: Heart healthy diet  Per Telephone Call  Transition Care Management Follow-up Telephone Call   Date discharged? 02/20/19   How have you been since you were released from the hospital? Patient wife stated he is doing well.  Do you understand why you were in the hospital? yes  Do you understand the discharge instructions? yes   Where were you discharged to? Home   Items Reviewed:  Medications reviewed: Yes  Allergies reviewed: Yes  Dietary changes reviewed: Yes  Referrals reviewed: Yes   Functional Questionnaire:   Activities of Daily Living (ADLs):   He states they are independent in the following: ambulation, bathing and hygiene, feeding, continence, grooming, toileting and dressing States they require assistance with the following: N/A   Any transportation issues/concerns?: No   Any patient concerns? No   Confirmed importance and date/time of follow-up visits scheduled Yes, Appointment 02/26/19  Provider Appointment booked with Dr.Hunter  Confirmed with patient if condition begins to worsen call PCP or go to the ER.  Patient was given the office number and encouraged to call back with question or concerns.  :Yes

## 2019-02-21 NOTE — Telephone Encounter (Signed)
Noted thanks °

## 2019-02-21 NOTE — Telephone Encounter (Signed)
Son is calling in on the patient saying he was given steroids at discharge and he wants to make sure he has enough to last him until the 28th (the day of his appt here). The son Dr. Lacinda Axon didn't know if you had a role in this Dr. Posey Pronto. Thanks!

## 2019-02-21 NOTE — Telephone Encounter (Signed)
Pt was prescribed #120 ( taking QID) at the hospital. Pt will have more than enough Prednisone until the appt on 03/08/19.  Spoke with pts wife today and she understood.

## 2019-02-22 LAB — MISC LABCORP TEST (SEND OUT): Labcorp test code: 140640

## 2019-02-26 ENCOUNTER — Other Ambulatory Visit: Payer: Self-pay

## 2019-02-26 ENCOUNTER — Ambulatory Visit: Payer: Medicare Other | Admitting: Family Medicine

## 2019-02-26 ENCOUNTER — Encounter: Payer: Self-pay | Admitting: Family Medicine

## 2019-02-26 VITALS — BP 108/60 | HR 47 | Temp 97.9°F | Ht 69.0 in | Wt 183.0 lb

## 2019-02-26 DIAGNOSIS — I69959 Hemiplegia and hemiparesis following unspecified cerebrovascular disease affecting unspecified side: Secondary | ICD-10-CM

## 2019-02-26 DIAGNOSIS — R7989 Other specified abnormal findings of blood chemistry: Secondary | ICD-10-CM

## 2019-02-26 DIAGNOSIS — M6281 Muscle weakness (generalized): Secondary | ICD-10-CM | POA: Insufficient documentation

## 2019-02-26 DIAGNOSIS — R945 Abnormal results of liver function studies: Secondary | ICD-10-CM

## 2019-02-26 DIAGNOSIS — E7800 Pure hypercholesterolemia, unspecified: Secondary | ICD-10-CM

## 2019-02-26 DIAGNOSIS — R897 Abnormal histological findings in specimens from other organs, systems and tissues: Secondary | ICD-10-CM

## 2019-02-26 DIAGNOSIS — I2510 Atherosclerotic heart disease of native coronary artery without angina pectoris: Secondary | ICD-10-CM

## 2019-02-26 DIAGNOSIS — E538 Deficiency of other specified B group vitamins: Secondary | ICD-10-CM

## 2019-02-26 DIAGNOSIS — R748 Abnormal levels of other serum enzymes: Secondary | ICD-10-CM

## 2019-02-26 LAB — CBC
HCT: 44.6 % (ref 39.0–52.0)
Hemoglobin: 15.1 g/dL (ref 13.0–17.0)
MCHC: 33.9 g/dL (ref 30.0–36.0)
MCV: 91.1 fl (ref 78.0–100.0)
Platelets: 242 10*3/uL (ref 150.0–400.0)
RBC: 4.89 Mil/uL (ref 4.22–5.81)
RDW: 13.4 % (ref 11.5–15.5)
WBC: 10.7 10*3/uL — ABNORMAL HIGH (ref 4.0–10.5)

## 2019-02-26 LAB — COMPREHENSIVE METABOLIC PANEL
ALT: 487 U/L — ABNORMAL HIGH (ref 0–53)
AST: 275 U/L — ABNORMAL HIGH (ref 0–37)
Albumin: 3.9 g/dL (ref 3.5–5.2)
Alkaline Phosphatase: 71 U/L (ref 39–117)
BUN: 18 mg/dL (ref 6–23)
CO2: 29 mEq/L (ref 19–32)
Calcium: 9.6 mg/dL (ref 8.4–10.5)
Chloride: 99 mEq/L (ref 96–112)
Creatinine, Ser: 0.56 mg/dL (ref 0.40–1.50)
GFR: 146.03 mL/min (ref >60.00)
Glucose, Bld: 117 mg/dL — ABNORMAL HIGH (ref 70–99)
Potassium: 4.1 mEq/L (ref 3.5–5.1)
Sodium: 138 mEq/L (ref 135–145)
Total Bilirubin: 0.4 mg/dL (ref 0.2–1.2)
Total Protein: 6.6 g/dL (ref 6.0–8.3)

## 2019-02-26 LAB — CK: Total CK: 6626 U/L — ABNORMAL HIGH (ref 7–232)

## 2019-02-26 NOTE — Patient Instructions (Addendum)
Please stop by lab before you go If you do not have mychart- we will call you about results within 5 business days of [place] receiving them.  If you have mychart- we will send your results within 3 business days of [place] receiving them.  If abnormal or we want to clarify a result, we will call or mychart you to make sure you receive the message.  If you have questions or concerns or don't hear within 5-7 days, please send [place] a message or call us.   Continue prednisone, keep visit with Dr. Posey Pronto.   In 1 month- reach out and I will order b12 and thyroid follow up. Depending on CK levels- we may do another Ck before that time. May be able to do in [place], [facility]

## 2019-02-26 NOTE — Progress Notes (Signed)
Phone [phone]   Subjective:  Estel Scholze is a 68 y.o. year old very pleasant male patient who presents for transitional care management and hospital follow up for elevated CK level. Patient was hospitalized from Feb 16, 2019 to Feb 20, 2019. A TCM phone call was completed on 02/21/2019. Medical complexity high  Patient is a 68 year old male with past medical history of coronary artery disease followed Dr. 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  Patient had an extensive work-up including neurology consult and surgery consult for muscle biopsy.  Muscle biopsy was completed on May 12 and high-dose prednisone was started May 11.  For progressive proximal muscle weakness-greater than distal muscle weakness/rhabdomyolysis/possible myositis - Neurology followed patient and guided an extensive evaluation.  Initially there was some concern for Guillain Barr or Eaton-Lambert syndrome but due to preserved reflexes they thought this was less likely.  Patient did have an atypical rash but it was not considered consistent with dermatomyositis.  They were aware of baseline right-sided weakness and upper extremity weakness after prior fall from [place] and cervical spine injury. - CK levels peaked around 17,000 and by discharge were down to under 12,000.  Myoglobin levels were over 5000 and were down to 468 by discharge. - Thyroid levels mildly off-they recommended Synthroid.  I discussed this with patient's son when he was discharged and we opted to hold off for now-repeat TSH levels in the future - Lyme testing negative, hepatitis testing negative, ANA  negative, anti-Jo negative, scleroderma antibodies negative, voltage-gated calcium channel antibody testing negative, ehrlichiosis antibodies negative. -At time of discharge serum paraneoplastic panel and RMSF labs were pending  -RMSF antibodies weakly positive- he states had RMSF years ago   -paraneoplastic panel still pending - CT chest abdomen pelvis with no acute evidence of malignancy.  Had multiple pulmonary nodules apically. We agreed to repeat imaging in 3 months - MRI of left femur showed abnormal diffuse muscular edema-most concerning for polymyositis, dermatomyositis, connective tissue disease with associated myositis, other inflammatory myopathies, cuter subacute autoimmune neuropathy, rhabdomyolysis, vasculitis - MRI of cervical spine as below-consistent with prior injuries/surgery -MRI lumbar spine- no acute findings- degenerative disc disease noted - MRI thoracic spine-degenerative disc disease noted - Patient also had a punch biopsy of his rash in addition to the left rectus femoris muscle biopsy -Patient was started on prednisone 80 mg daily which is equivalent to 1 mg/kg/day on 5/11-patient responded well to this.  -Taper planned starting 4 to 6 weeks out -Home health was recommended - Dr. Posey Pronto neuromuscular specialist recommended outpatient follow-up with neuro only- hold off on rheumatology consult  Rhabdomyolysis-etiology unknown but thought to be part of proximal muscle weakness pathology.  Some improvement inpatient with plan for repeat today.  Muscle biopsy report-still pending  Hyperlipidemia-at discharge-  Praluent currently on hold though not thought to have caused the myopathy as he has been on it for almost a year per neurology. Patient has spoken to Dr. Einar Gip post discharge and plan is to restart this week  CAD status post PCI September 2016-chest pain-free during hospitalization.  Dr. Einar Gip followed along with patient in the hospital.  Continue aspirin.  Acute  transaminitis-hepatitis panel negative.  Improving-likely related to elevated CK/muscle weakness pathology  BPH-continue on Flomax  Abnormal thyroid function test-possible mild hypothyroidism and started on low-dose Synthroid-we opted to repeat TSH at follow-up  B12 deficiency-started on oral B12 supplements-can recheck B12 level when checked a TSH  Innumerable tiny centrilobular pulmonary nodules and apically predominant distribution which may reflect sequela of atypical infection or smoking-related respiratory bronchiolitis- hospitalist mentioned possible CT follow-up in a few months- we agreed to consider  3-6 month ct   Today, he still feels substantially weak- perhaps the mildest of improvement if any. Had one day in hospital where felt some more strength but that has not persisted. He appears to move quicker and has less difficulty getting onto table today though   See problem oriented charting as well ROS- no chest pain or shortness of breath. Still feels fatigued and weak.    Past Medical History-  Patient Active Problem List   Diagnosis Date Noted   CAD (coronary artery disease), native coronary artery 07/13/2015    Priority: High   S/P PTCA (percutaneous transluminal coronary angioplasty) 07/08/2015    Priority: High   Hemiparesis ([place]) 09/18/2014    Priority: Medium   BPH (benign prostatic hyperplasia) 09/18/2014    Priority: Medium   Hyperlipidemia     Priority: Medium   Bradycardia     Priority: Medium   History of colonic polyps 10/15/2014    Priority: Low   Transaminitis 09/18/2014    Priority: Low   B12 deficiency 02/26/2019   Proximal muscle weakness 02/26/2019   Rhabdomyolysis 02/16/2019   Weakness 02/16/2019   Elevated PSA 02/25/2017   Post PTCA 04/16/2016    Medications- reviewed and updated  A medical reconciliation was performed comparing current medicines to hospital discharge medications. Current Outpatient Medications  Medication Sig  Dispense Refill   amoxicillin (AMOXIL) 500 MG capsule Take 2,000 mg by mouth See admin instructions. Take 4 capsules (2000 mg) by mouth one hours prior to dental appointments     aspirin 81 MG chewable tablet Chew 1 tablet (81 mg total) by mouth at bedtime.     calcium-vitamin D (OSCAL WITH D) 500-200 MG-UNIT tablet Take 1 tablet by mouth 3 (three) times daily. 90 tablet 0   finasteride (PROSCAR) 5 MG tablet Take 5 mg by mouth at bedtime.      fluticasone (FLONASE) 50 MCG/ACT nasal spray Place 2 sprays into both nostrils daily as needed for allergies or rhinitis.     nitroGLYCERIN (NITROSTAT) 0.4 MG SL tablet Place 1 tablet (0.4 mg total) under the tongue every 5 (five) minutes as needed for chest pain. 25 tablet 4   pantoprazole (PROTONIX) 40 MG tablet Take 1 tablet (40 mg total) by mouth daily. 30 tablet 0   Polyvinyl Alcohol-Povidone (REFRESH OP) Place 1 drop into both eyes daily.     predniSONE (DELTASONE) 20 MG tablet Take 4 tablets (80 mg total) by mouth daily with breakfast. 120 tablet 0   sulfamethoxazole-trimethoprim (BACTRIM DS) 800-160 MG tablet Take 1 tablet by mouth 3 (three) times a week. 12 tablet 0   tamsulosin (FLOMAX) 0.4 MG CAPS capsule TAKE 1 CAPSULE BY MOUTH ONCE DAILY WITH SUPPER (Patient taking differently: Take 0.4 mg by mouth at bedtime. ) 90 capsule 3   vitamin B-12 (CYANOCOBALAMIN) 1000 MCG tablet Take 1 tablet (1,000 mcg total) by mouth daily. 30 tablet 0   PRALUENT 150 MG/ML SOAJ Inject in to skin every 2 weeks     Objective  Objective:  BP 108/60 (BP Location: Left Arm, Patient Position: Sitting, Cuff Size: Normal)    Pulse (!) 47    Temp 97.9 F (36.6 C) (Oral)    Ht 5\' 9"  (1.753 m)    Wt 183 lb (83 kg)    SpO2 96%    BMI 27.02 kg/m  Gen: NAD, resting comfortably CV: bradycardic in 50s but regular,  no murmurs rubs or gallops Lungs: CTAB no crackles, wheeze, rhonchi Abdomen: soft/nontender/nondistended/normal bowel sounds. Ext: no edema Skin: warm,  dry Neuro: patient with 4+/5 strength bilaterally with hip flexion- improvement from 4-/5 previously. 4+/5 with knee extension on right and 5/5 on left knee extension. Patient gets onto table with increased ease from last visit- took him much shorter period to navigate/use strength to pull himself up.   Dg Lumbar Spine Complete  Result Date: 02/15/2019 CLINICAL DATA:  Lower extremity weakness. EXAM: LUMBAR SPINE - COMPLETE 4+ VIEW COMPARISON:  CT and pelvis dated 03/03/2018 FINDINGS: There is no displaced fracture. Degenerative changes are noted of the lumbar spine with moderate to severe disc height loss at the L5-S1 level. There is facet arthrosis of the lower lumbar segments. Atherosclerotic changes are noted of the abdominal aorta. A phlebolith projects over the patient's left hemipelvis. A moderate amount of stool is noted in the colon. IMPRESSION: 1. No acute osseous abnormality. 2. Multilevel degenerative changes, greatest at the L5-S1 level. 3. Atherosclerotic changes of the abdominal aorta. Electronically Signed   By: Constance Holster M.D.   On: 02/15/2019 17:38   Ct Chest W Contrast  Result Date: 02/17/2019 CLINICAL DATA:  Generalized muscle weakness, evaluate for malignant etiology EXAM: CT CHEST, ABDOMEN, AND PELVIS WITH CONTRAST TECHNIQUE: Multidetector CT imaging of the chest, abdomen and pelvis was performed following the standard protocol during bolus administration of intravenous contrast. CONTRAST:  156mL OMNIPAQUE IOHEXOL 300 MG/ML  SOLN COMPARISON:  CT abdomen pelvis, 03/03/2018, CT chest, 10/12/2014 FINDINGS: CT CHEST FINDINGS Cardiovascular: Extensive 3 vessel coronary artery calcifications and/or stents. Normal heart size. No pericardial effusion. Mediastinum/Nodes: No enlarged mediastinal, hilar, or axillary lymph nodes. Thyroid gland, trachea, and esophagus demonstrate no significant findings. Lungs/Pleura: Multiple small benign calcified pulmonary nodules present bilaterally.  Innumerable tiny centrilobular pulmonary nodules in an apically predominant distribution, which may reflect sequelae of atypical infection or smoking-related respiratory bronchiolitis. There is an unchanged, benign somewhat bandlike consolidation in the right lung base. No pleural effusion or pneumothorax. Musculoskeletal: No chest wall mass or suspicious bone lesions identified. CT ABDOMEN PELVIS FINDINGS Hepatobiliary: No focal liver abnormality is seen. No gallstones, gallbladder wall thickening, or biliary dilatation. Pancreas: Unremarkable. No pancreatic ductal dilatation or surrounding inflammatory changes. Spleen: Normal in size without focal abnormality. Adrenals/Urinary Tract: Adrenal glands are unremarkable. Kidneys are normal, without renal calculi, focal lesion, or hydronephrosis. Bladder is unremarkable. Stomach/Bowel: Stomach is within normal limits. Appendix appears normal. No evidence of bowel wall thickening, distention, or inflammatory changes. Vascular/Lymphatic: Severe mixed calcific atherosclerosis of the abdominal aorta and branch vessels. No enlarged abdominal or pelvic lymph nodes. Reproductive: Prostatomegaly. Other: No abdominal wall hernia or abnormality. No abdominopelvic ascites. Musculoskeletal: No acute or significant osseous findings. IMPRESSION: 1.  No CT evidence of malignancy in the chest, abdomen, or pelvis. 2. Innumerable tiny centrilobular pulmonary nodules in an apically predominant distribution, which may reflect sequelae of atypical infection or smoking-related respiratory bronchiolitis. There is an unchanged, benign somewhat bandlike consolidation in the right lung base. 3.  Coronary artery disease. 4.  Atherosclerosis. 5.  Prostatomegaly. Electronically  Signed   By: Eddie Candle M.D.   On: 02/17/2019 16:25   I independently reviewed CT of the chest/abdomen/pelvis and noted multiple small central pulmonary nodules- primarily in apices. Some consolidation in right base  which appears stable from prior imaging. Calcium noted on coronary artery. Prostate enlargement also noted  Mr Cervical Spine Wo Contrast  Result Date: 02/17/2019 CLINICAL DATA:  Myelopathy. Progressive lower extremity weakness. Multiple falls. 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 EXAM: MRI CERVICAL SPINE WITHOUT CONTRAST TECHNIQUE: Multiplanar, multisequence MR imaging of the cervical spine was performed. No intravenous contrast was administered. COMPARISON:  None. FINDINGS: Alignment: Minimal retrolisthesis of C3 on C4. Vertebrae: C4-5 and C5-6 interbody osseous fusion with anterior fusion plate and screws in place at C4-5. No fracture, suspicious osseous lesion, or significant marrow edema. Chronic degenerative endplate changes at M2-2 greater than C3-4 associated with disc space narrowing. Cord: Well-defined T2 hyperintensity in the cord bilaterally at C4 and C5 with mild cord volume loss more notable at C4 consistent with myelomalacia. Posterior Fossa, vertebral arteries, paraspinal tissues: Dominant appearing left vertebral artery with preserved flow void. Poor visualization of the right vertebral artery which is likely hypoplastic. Unremarkable included portion of the posterior fossa. Disc levels: C2-3: Minimal disc bulging and moderate left facet arthrosis without stenosis. C3-4: Disc bulging and uncovertebral spurring result in mild-to-moderate spinal stenosis and moderate right and mild-to-moderate left neural foraminal stenosis. C4-5: Anterior fusion.  No significant stenosis. C5-6: Anterior fusion. Right-sided osteophytic ridging results in mild right neural foraminal stenosis. Patent spinal canal and left neural foramen. C6-7: Broad-based posterior disc osteophyte complex results in moderate spinal stenosis and moderate right and mild left neural foraminal stenosis. C7-T1: Negative. IMPRESSION: 1. 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 2. Solid anterior fusion at C4-5 and C5-6  without spinal stenosis at these levels. 3. Adjacent segment degenerative changes with moderate spinal stenosis at C6-7, mild-to-moderate spinal stenosis at C3-4, and mild-to-moderate neural foraminal stenosis at both levels. Electronically Signed   By: Logan Bores M.D.   On: 02/17/2019 12:17   Mr Thoracic Spine Wo Contrast  Result Date: 02/17/2019 CLINICAL DATA:  Myelopathy. Progressive lower extremity weakness. Multiple falls. 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 EXAM: MRI THORACIC SPINE WITHOUT CONTRAST TECHNIQUE: Multiplanar, multisequence MR imaging of the thoracic spine was performed. No intravenous contrast was administered. COMPARISON:  Chest CT 10/31/2015 FINDINGS: Alignment:  Normal. Vertebrae: No fracture, suspicious osseous lesion, or significant marrow edema. Small hemangioma in the T8 vertebral body. Cord: Slight prominence of the central canal of the spinal cord in the mid to lower thoracic spine most notably at T9-10. No frank syrinx formation. Normal cord signal elsewhere. Paraspinal and other soft tissues: Partially visualized 1.4 cm T2 hyperintense lesion in the upper pole of the right kidney as well as a subcentimeter T2 hyperintensity in the upper pole of the left kidney, likely cysts but incompletely evaluated. Trace bilateral pleural fluid. Disc levels: Mild disc bulging at T1-2, T2-3, and T5-6. Tiny left paracentral disc protrusions at T6-7 and T7-8. No spinal stenosis or cord compression. Mild facet arthrosis in the upper and lower thoracic spine without evidence of compressive neural foraminal stenosis. IMPRESSION: 1. Mild thoracic disc and facet degeneration without evidence of neural impingement. 2. Minimal prominence of the central canal of the mid to lower thoracic spinal cord without syrinx. Electronically Signed   By: Logan Bores M.D.   On: 02/17/2019 12:36   Mr Lumbar Spine Wo Contrast  Result Date: 02/17/2019 CLINICAL DATA:  Myelopathy.  Progressive lower extremity weakness. Multiple falls. 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 EXAM: MRI LUMBAR SPINE WITHOUT CONTRAST TECHNIQUE: Multiplanar, multisequence MR imaging of the lumbar spine was performed. No intravenous contrast was administered. COMPARISON:  Lumbar spine radiographs 02/15/2019. CT abdomen and pelvis 03/03/2018. FINDINGS: Segmentation:  Standard. Alignment:  Normal. Vertebrae: No fracture or suspicious osseous lesion. Moderate degenerative endplate changes at Q6-P6, predominantly Modic type 2. Conus medullaris and cauda equina: Conus extends to the T12-L1 level. Conus and cauda equina appear normal. Paraspinal and other soft tissues: Unremarkable. Disc levels: Disc desiccation throughout the lumbar spine with exception of L1-2. Moderate to severe disc space narrowing at L5-S1 with milder narrowing at L2-3, L3-4, and L4-5. L1-2: Normal disc.  Slight facet hypertrophy without stenosis. L2-3: Circumferential disc bulging and mild facet and ligamentum flavum hypertrophy without significant stenosis. L3-4: Circumferential disc bulging, a small right foraminal to extraforaminal disc protrusion, and mild facet and ligamentum flavum hypertrophy result in borderline lateral recess stenosis and mild bilateral neural foraminal stenosis with the disc protrusion potentially affecting the exiting right L3 nerve root. No significant spinal stenosis. L4-5: Circumferential disc bulging, a right foraminal to extraforaminal disc osteophyte complex, and mild facet and ligamentum flavum hypertrophy result in mild right neural foraminal stenosis with the disc osteophyte complex potentially affecting the right L4 nerve lateral to the foramen. No significant spinal stenosis. L5-S1: Prior left laminectomy. Broad central disc protrusion, disc bulging asymmetric to the left, and mild facet hypertrophy result in mild right and moderate left neural foraminal stenosis without spinal  stenosis. There is likely some epidural fibrosis in the left lateral recess. IMPRESSION: 1. Relatively diffuse lumbar disc and facet degeneration without significant spinal stenosis. 2. Multilevel neural foraminal stenosis, moderate on the left at L5-S1. 3. Normal appearance of the conus medullaris and cauda equina. Electronically Signed   By: Logan Bores M.D.   On: 02/17/2019 12:28   Ct Abdomen Pelvis W Contrast  Result Date: 02/17/2019 CLINICAL DATA:  Generalized muscle weakness, evaluate for malignant etiology EXAM: CT CHEST, ABDOMEN, AND PELVIS WITH CONTRAST TECHNIQUE: Multidetector CT imaging of the chest, abdomen and pelvis was performed following the standard protocol during bolus administration of intravenous contrast. CONTRAST:  135mL OMNIPAQUE IOHEXOL 300 MG/ML  SOLN COMPARISON:  CT abdomen pelvis, 03/03/2018, CT chest, 10/12/2014 FINDINGS: CT CHEST FINDINGS Cardiovascular: Extensive 3 vessel coronary artery calcifications and/or stents. Normal heart size. No pericardial effusion. Mediastinum/Nodes: No enlarged mediastinal, hilar, or axillary lymph nodes. Thyroid gland, trachea, and esophagus demonstrate no significant findings. Lungs/Pleura: Multiple small benign calcified pulmonary nodules present bilaterally. Innumerable tiny centrilobular pulmonary nodules in an apically predominant distribution, which may reflect sequelae of atypical infection or smoking-related respiratory bronchiolitis. There is an unchanged, benign somewhat bandlike consolidation in the right lung base. No pleural effusion or pneumothorax. Musculoskeletal: No chest wall mass or suspicious bone lesions identified. CT ABDOMEN PELVIS FINDINGS Hepatobiliary: No focal liver abnormality is seen. No gallstones, gallbladder wall thickening, or biliary dilatation. Pancreas: Unremarkable. No pancreatic ductal dilatation or surrounding inflammatory changes. Spleen: Normal in size without focal abnormality. Adrenals/Urinary Tract: Adrenal  glands are unremarkable. Kidneys are normal, without renal calculi, focal lesion, or hydronephrosis. Bladder is unremarkable. Stomach/Bowel: Stomach is within normal limits. Appendix appears normal. No evidence of bowel wall thickening, distention, or inflammatory changes. Vascular/Lymphatic: Severe mixed calcific atherosclerosis of the abdominal aorta and branch vessels. No enlarged abdominal or pelvic lymph nodes. Reproductive: Prostatomegaly. Other: No abdominal  wall hernia or abnormality. No abdominopelvic ascites. Musculoskeletal: No acute or significant osseous findings. IMPRESSION: 1.  No CT evidence of malignancy in the chest, abdomen, or pelvis. 2. Innumerable tiny centrilobular pulmonary nodules in an apically predominant distribution, which may reflect sequelae of atypical infection or smoking-related respiratory bronchiolitis. There is an unchanged, benign somewhat bandlike consolidation in the right lung base. 3.  Coronary artery disease. 4.  Atherosclerosis. 5.  Prostatomegaly. Electronically Signed   By: Eddie Candle M.D.   On: 02/17/2019 16:25   Mr Femur Left W Wo Contrast  Result Date: 02/18/2019 CLINICAL DATA:  Progressive lower extremity and proximal muscle weakness. Elevated CK levels and elevated liver function tests. EXAM: MR OF THE LEFT LOWER EXTREMITY WITHOUT AND WITH CONTRAST TECHNIQUE: Multiplanar, multisequence MR imaging of the left thigh was performed both before and after administration of intravenous contrast. CONTRAST:  8 cc Gadavist COMPARISON:  None. FINDINGS: Bones/Joint/Cartilage Unremarkable Ligaments N/A Muscles and Tendons There is abnormal diffuse muscle edema and enhancement primarily involving the gluteus maximus, gluteus medius, hip adductor musculature, hamstring musculature, tensor fascia lata, iloipsoas, and medial head gastrocnemius muscles. Coronal images reveal is to be a roughly symmetric process. Much lesser degree of involvement in the gluteus maximus, vastus  intermedius, and vastus lateralis muscles with only focal low-grade activity in these muscle groups. No abscess or discrete fluid collection is identified. Soft tissues No overt cutaneous or subcutaneous edema identified. IMPRESSION: 1. Abnormal diffuse muscular edema and enhancement involving various muscles of the lower pelvis and thigh as detailed above. The coronal images indicate that this is a grossly symmetric process. I note that there is a plan for quadriceps muscle biopsy; I would recommend targeting the rectus femoris muscle and referring to today's MRI images given that the other quadriceps muscles demonstrate only minimal involvement. 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 Electronically Signed   By: Van Clines M.D.   On: 02/18/2019 11:41   Dg Chest Port 1v Same Day  Result Date: 02/17/2019 CLINICAL DATA:  Dyspnea. EXAM: PORTABLE CHEST 1 VIEW COMPARISON:  CT scan of October 31, 2015. FINDINGS: The heart size and mediastinal contours are within normal limits. Both lungs are clear. No pneumothorax or pleural effusion is noted. The visualized skeletal structures are unremarkable. IMPRESSION: No active disease. Electronically Signed   By: Marijo Conception M.D.   On: 02/17/2019 09:06      Assessment and Plan:   #Proximal muscle weakness-patient states he has had minimal improvement.  Based on how he is moving today and his resistance to my muscle strength examination-appears to at least have mild improvement.  - See extensive work-up above-had MRI lumbar spine, thoracic spine, cervical spine.  Had CT abdomen pelvis and chest to look for potential malignancy related cause.   Had MRI left femur as well.  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  Dermatomyositis was thought less likely given rash pattern-punch biopsy pending as well though  -Paraneoplastic panel still pending  -Doubt RMSF from years ago contributing  -Agree with neurology follow-up-scheduled for 10 days  -Agree with continued steroids at 80 mg until further neurological evaluation with Dr. Posey Pronto  - Hopeful muscle biopsy results are back for follow-up-would still ask for Dr. Serita Grit opinion on EMG but depending on biopsy  results may not be warranted  -baseline hemiparesis from prior injury/surgery  #Elevated CK- significant improvement today after he has had some time on prednisone.  I think this points toward polymyositis as cause considering he had very little improvement during hospitalization even with hydration.  We opted to not repeat CK before neurology visit-would ask for Dr. Serita Grit opinion at that time  #Elevated LFTs-hepatitis panel was negative-I suspect this point along with muscle breakdown-we will trend LFTs when we get B12 and TSH in about a month.  #Elevated TSH- I wonder if this was related to acute illness.  Hospitalist started him on Synthroid low-dose-we opted to repeat TSH and T4 in a month and hold off on Synthroid for now.  May this decision in combination with patient, wife, son who is a physician  #B12 deficiency- continue oral B12 supplements.  Recheck B12 at follow-up  #CAD/hyperlipidemia-continue outpatient follow-up with Dr. Einar Gip.  Continue aspirin.  As above-Dr. Einar Gip wanted to restart Praluent this week-patient plans to do so  #Pulmonary nodules numerous- possibly related to prior atypical infection.  We will plan to repeat CT chest in 3 to 6 months   #BPH-continue Flomax   To do: 1.repeat CT chest in 3 to 6 months 2.  Labs likely at [facility] in [place] in 1 month- B12, TSH, T4, CMP 3.  I am setting reminder for labs in 1 month- then will defer another  2 months for CT   Future Appointments  Date Time Provider [place]  03/08/2019 11:00 AM Narda Amber K, DO [facility] None  06/07/2019  2:20 PM Marin Olp, MD [facility] P[facility]  06/08/2019  2:00 PM Adrian Prows, MD [facility] None   Lab/Order associations: Elevated CK - Plan: CBC, Comprehensive metabolic panel, CK (Creatine Kinase), Myoglobin, serum  Elevated myoglobin level - Plan: Myoglobin, serum  Proximal muscle weakness - Plan: Myoglobin, serum  Elevated LFTs  Elevated TSH  B12 deficiency  Coronary artery disease involving native coronary artery of native heart without angina pectoris  Pure hypercholesterolemia  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  Garret Reddish, MD

## 2019-02-28 ENCOUNTER — Encounter: Payer: Self-pay | Admitting: Family Medicine

## 2019-02-28 LAB — MYOGLOBIN, SERUM: Myoglobin: 4090 mcg/L — ABNORMAL HIGH (ref <=95)

## 2019-03-05 ENCOUNTER — Encounter: Payer: Self-pay | Admitting: Family Medicine

## 2019-03-06 ENCOUNTER — Telehealth: Payer: Self-pay | Admitting: Neurology

## 2019-03-06 NOTE — Telephone Encounter (Signed)
Gabriel Cirri calling in from [facility] needing lab work results on this patient. Thanks!

## 2019-03-07 ENCOUNTER — Other Ambulatory Visit: Payer: Self-pay | Admitting: Family Medicine

## 2019-03-07 ENCOUNTER — Encounter: Payer: Self-pay | Admitting: Family Medicine

## 2019-03-07 LAB — BASIC METABOLIC PANEL
Creatinine: 0.8 (ref 0.6–1.3)
Glucose: 188

## 2019-03-07 NOTE — Telephone Encounter (Signed)
I spoke to Mr. Summerson wife and she gave me the # to [place] so I can call and get there fax#.  I will message her back once we fax the script needed for labs.

## 2019-03-07 NOTE — Telephone Encounter (Signed)
Most recent labs sent to [place]

## 2019-03-08 ENCOUNTER — Other Ambulatory Visit: Payer: Self-pay | Admitting: Neurology

## 2019-03-08 ENCOUNTER — Encounter: Payer: Self-pay | Admitting: Neurology

## 2019-03-08 ENCOUNTER — Telehealth: Payer: Self-pay | Admitting: Neurology

## 2019-03-08 ENCOUNTER — Other Ambulatory Visit: Payer: Self-pay

## 2019-03-08 ENCOUNTER — Ambulatory Visit: Payer: Medicare Other | Admitting: Neurology

## 2019-03-08 VITALS — BP 122/69 | Temp 97.8°F | Ht 69.0 in | Wt 177.0 lb

## 2019-03-08 DIAGNOSIS — G7249 Other inflammatory and immune myopathies, not elsewhere classified: Secondary | ICD-10-CM

## 2019-03-08 MED ORDER — PREDNISONE 20 MG PO TABS: 80.0000 mg | ORAL_TABLET | Freq: Every day | ORAL | 3 refills | Status: DC

## 2019-03-08 MED ORDER — SULFAMETHOXAZOLE-TRIMETHOPRIM 800-160 MG PO TABS: 1.0000 | ORAL_TABLET | ORAL | 3 refills | Status: DC

## 2019-03-08 MED ORDER — CALCIUM CARBONATE-VITAMIN D 500-200 MG-UNIT PO TABS: 1.0000 | ORAL_TABLET | Freq: Three times a day (TID) | ORAL | 3 refills | Status: DC

## 2019-03-08 MED ORDER — PANTOPRAZOLE SODIUM 40 MG PO TBEC: 40.0000 mg | DELAYED_RELEASE_TABLET | Freq: Every day | ORAL | 3 refills | Status: DC

## 2019-03-08 NOTE — Telephone Encounter (Signed)
Spoke to Dr. Lacinda Axon and wants to talk to Dr. Posey Pronto.
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
## 2019-03-08 NOTE — Patient Instructions (Addendum)
Please go to the lab on Monday so we can send labs to [facility]  Continue prednisone 80mg  daily, calcium, protonix, and bactrim three times per week  I will follow-up with your CK and biopsy results  I will also start prior authorization process for IVIG, in the event we need to use this  Return to clinic on June 9th at 3pm

## 2019-03-08 NOTE — Telephone Encounter (Signed)
Returned call to Dr. Lacinda Axon and discussed management plan.

## 2019-03-08 NOTE — Telephone Encounter (Signed)
Dr. Thersa Salt (phy @ [facility]) ; son of patient is calling in wanting to speak with Dr. Patel/ nurse about what is going on with patient. Please call him back at [phone]. He said that his parents were confused about next steps. Thanks!

## 2019-03-09 DIAGNOSIS — G7249 Other inflammatory and immune myopathies, not elsewhere classified: Secondary | ICD-10-CM | POA: Insufficient documentation

## 2019-03-09 LAB — CK+LD, TOTALS+ISOENZYMES
(LD) Fraction 1: 14 % — ABNORMAL LOW (ref 17–32)
(LD) Fraction 2: 35 % (ref 25–40)
(LD) Fraction 3: 23 % (ref 17–27)
(LD) Fraction 4: 12 % (ref 5–13)
(LD) Fraction 5: 16 % (ref 4–20)
CK-BB: 0 %
CK-MB: 10 % — ABNORMAL HIGH (ref 0–3)
CK-MM: 77 % — ABNORMAL LOW (ref 97–100)
LDH: 871 IU/L — ABNORMAL HIGH (ref 121–224)
Macro Type 1: 13 % — ABNORMAL HIGH
Macro Type 2: 0 %
Total CK: 7703 U/L (ref 41–331)

## 2019-03-09 LAB — BASIC METABOLIC PANEL
BUN/Creatinine Ratio: 31 — ABNORMAL HIGH (ref 10–24)
BUN: 25 mg/dL (ref 8–27)
CO2: 23 mmol/L (ref 20–29)
Calcium: 10.1 mg/dL (ref 8.6–10.2)
Chloride: 91 mmol/L — ABNORMAL LOW (ref 96–106)
Creatinine, Ser: 0.81 mg/dL (ref 0.76–1.27)
GFR calc Af Amer: 108 mL/min/{1.73_m2} (ref >59)
GFR calc non Af Amer: 93 mL/min/{1.73_m2} (ref >59)
Glucose: 188 mg/dL — ABNORMAL HIGH (ref 65–99)
Potassium: 5.2 mmol/L (ref 3.5–5.2)
Sodium: 137 mmol/L (ref 134–144)

## 2019-03-09 LAB — SPECIMEN STATUS REPORT

## 2019-03-12 DIAGNOSIS — G7289 Other specified myopathies: Secondary | ICD-10-CM

## 2019-03-12 HISTORY — PX: LIVER BIOPSY: SHX301

## 2019-03-12 HISTORY — DX: Other specified myopathies: G72.89

## 2019-03-19 ENCOUNTER — Encounter: Payer: Self-pay | Admitting: Gastroenterology

## 2019-03-19 ENCOUNTER — Telehealth: Payer: Self-pay | Admitting: Neurology

## 2019-03-19 NOTE — Telephone Encounter (Signed)
Pt's daughter Michael Boston would like to speak with you regarding pt's path results. Pls call her.

## 2019-03-19 NOTE — Telephone Encounter (Signed)
Spoke with patients Son [place].  He has questions about a formal diagnosis.  He would like a call back.  Please advise.

## 2019-03-19 NOTE — Telephone Encounter (Signed)
Returned call and answered questions.  He will be accompanying his parents to the visit tomorrow at which time we can discuss management further.

## 2019-03-20 ENCOUNTER — Other Ambulatory Visit: Payer: Self-pay

## 2019-03-20 ENCOUNTER — Other Ambulatory Visit: Payer: Medicare Other

## 2019-03-20 ENCOUNTER — Encounter: Payer: Self-pay | Admitting: Neurology

## 2019-03-20 ENCOUNTER — Ambulatory Visit: Payer: Medicare Other | Admitting: Neurology

## 2019-03-20 ENCOUNTER — Encounter (HOSPITAL_COMMUNITY): Payer: Self-pay

## 2019-03-20 VITALS — BP 124/60 | HR 64 | Ht 69.0 in | Wt 175.0 lb

## 2019-03-20 DIAGNOSIS — R739 Hyperglycemia, unspecified: Secondary | ICD-10-CM

## 2019-03-20 DIAGNOSIS — Z7952 Long term (current) use of systemic steroids: Secondary | ICD-10-CM

## 2019-03-20 DIAGNOSIS — G7249 Other inflammatory and immune myopathies, not elsewhere classified: Secondary | ICD-10-CM

## 2019-03-20 DIAGNOSIS — Z131 Encounter for screening for diabetes mellitus: Secondary | ICD-10-CM

## 2019-03-20 DIAGNOSIS — E538 Deficiency of other specified B group vitamins: Secondary | ICD-10-CM

## 2019-03-20 MED ORDER — PANTOPRAZOLE SODIUM 40 MG PO TBEC: 40.0000 mg | DELAYED_RELEASE_TABLET | Freq: Every day | ORAL | 3 refills | Status: DC

## 2019-03-20 MED ORDER — CYANOCOBALAMIN 1000 MCG/ML IJ SOLN: INTRAMUSCULAR | 0 refills | Status: DC

## 2019-03-20 MED ORDER — "SYRINGE 22G X 1"" 3 ML MISC": 1 refills | Status: DC

## 2019-03-20 MED ORDER — PREDNISONE 20 MG PO TABS: 80.0000 mg | ORAL_TABLET | Freq: Every day | ORAL | 3 refills | Status: DC

## 2019-03-20 NOTE — Progress Notes (Signed)
Follow-up Visit   Date: 03/20/19   Stanley Boyd MRN: [id number] DOB: March 20, 1951   Interim History: Stanley Boyd is a 68 y.o. 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  The patient was accompanied to the clinic by son, Stanley Boyd (physician) who also provides collateral information.    History of present illness: Early April 2020, he began having difficulty walking, especially raising his legs, such as when getting into a car.  This gradually progressed over the next 4 weeks to the point where he was unable to raise his leg to climb onto a step. Around the same time, he developed a skin rash of the chest and forearm.  He was evaluated by his PCP who noted 4-/5 muscle strength in the legs and [place] at 14[address].  He was hospitalized at [facility] from 5/8 - 02/20/2019 for evaluation and hydration.  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  MRI left femur was most notable with diffuse, symmetric muscle edema and enhancement in the proximal hip girdle muscles. CK peaked at [address]. Left rectus femoris muscle biopsy and skin biopsy was performed.  He was emperically started on prednisone '80mg'$  due to high clinical suspicion for inflammatory myopathy, despite normal inflammatory/autoimmune markers (see below).  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  Skin biopsy showed benign lesion with fibrosis and mixed superficial and deep inflammation.  He has a history of CAD, followed by Dr. Einar Gip.  He was previously on statin therapy, but due to elevated liver enzymes, he was switched Praluent in 2019.  His LFTs have been elevated in the  past, the oldest labs in Epic are from November 2015 at [facility] which showed CK 219, AST 61*, ALT 90*, alk phos 126*.      UPDATE 03/20/2019:  He is here for 2 week follow-up with his son, Stanley Boyd.  He remains on prednisone '80mg'$  daily and CK last week was elevated at 7703.  He continues to have weakness of the arms and legs, no significant improvement despite being on prednisone '80mg'$  for almost a month. Weakness involves his upper arms and legs.  For example, applying deodorant is challenging because of inability to extend the arm and climb steps/stairs.  He is having greater difficulty walking, but is still walking unassisted. Fortunately, he has not suffered falls since his last visit.    No problems with swallowing, talking, cramps, muscle pain, neck heaviness, or shortness of breath.     Medications:  Current Outpatient Medications on File Prior to Visit  Medication Sig Dispense Refill  . amoxicillin (AMOXIL) 500 MG capsule Take 2,000 mg by mouth See admin instructions. Take 4 capsules (2000 mg) by mouth one hours prior to dental appointments    . aspirin 81 MG chewable tablet Chew 1 tablet (81 mg total) by mouth at bedtime.    . calcium-vitamin D (OSCAL WITH D) 500-200 MG-UNIT tablet Take 1 tablet by mouth 3 (three) times daily. 270 tablet 3  . finasteride (PROSCAR) 5 MG tablet Take 5 mg by mouth at bedtime.     . fluticasone (FLONASE) 50 MCG/ACT nasal spray Place 2 sprays into both nostrils daily as needed for allergies or rhinitis.    Marland Kitchen  nitroGLYCERIN (NITROSTAT) 0.4 MG SL tablet Place 1 tablet (0.4 mg total) under the tongue every 5 (five) minutes as needed for chest pain. 25 tablet 4  . Polyvinyl Alcohol-Povidone (REFRESH OP) Place 1 drop into both eyes daily.    Marland Kitchen PRALUENT 150 MG/ML SOAJ Inject in to skin every 2 weeks Currently on hold    . sulfamethoxazole-trimethoprim (BACTRIM DS) 800-160 MG tablet Take 1 tablet by mouth 3 (three) times a week. 40 tablet 3  .  tamsulosin (FLOMAX) 0.4 MG CAPS capsule TAKE 1 CAPSULE BY MOUTH ONCE DAILY WITH SUPPER (Patient taking differently: Take 0.4 mg by mouth at bedtime. ) 90 capsule 3   No current facility-administered medications on file prior to visit.     Allergies: No Known Allergies  Review of Systems:  CONSTITUTIONAL: No fevers, chills, night sweats, or weight loss.  EYES: No visual changes or eye pain ENT: No hearing changes.  No history of nose bleeds.   RESPIRATORY: No cough, wheezing and shortness of breath.   CARDIOVASCULAR: Negative for chest pain, and palpitations.   GI: Negative for abdominal discomfort, blood in stools or black stools.  No recent change in bowel habits.   GU:  No history of incontinence.   MUSCLOSKELETAL: No history of joint pain or swelling.  No myalgias.   SKIN: Negative for lesions, rash, and itching.   ENDOCRINE: Negative for cold or heat intolerance, polydipsia or goiter.   PSYCH:  No depression or anxiety symptoms.   NEURO: As Above.   Vital Signs:  BP 124/60   Pulse 64   Ht 5' 9" (1.753 m)   Wt 175 lb (79.4 kg)   HC 97" (246.4 cm)   BMI 25.84 kg/m   Neurological Exam: MENTAL STATUS including orientation to time, place, person, recent and remote memory, attention span and concentration, language, and fund of knowledge is normal.  Speech is not dysarthric.  CRANIAL NERVES:  No visual field defects.  Pupils equal round and reactive to light.  Normal conjugate, extra-ocular eye movements in all directions of gaze.  No ptosis.  Face is symmetric. Facial muscles are 5/5 bilaterally.  Palate elevates symmetrically.  Tongue is midline and strength is 5/5.  MOTOR:  Mild proximal leg atrophy, No fasciculations or abnormal movements.  No pronator drift.  Neck flexion and extension is 5/5.   Upper Extremity:  Right  Left  Deltoid  5-/5   5-/5   Biceps  5/5   5/5   Triceps  4/5   4/5   Infraspinatus 3+/5  3+/5  Medial pectoralis 3+/5  3+/5  Wrist extensors  5-/5    5/5   Wrist flexors  5-/5   5/5   Finger extensors  4/5   5/5   Finger flexors  4/5   5/5   Dorsal interossei  4/5   5/5   Abductor pollicis  4/5   5/5   Tone (Ashworth scale)  0  0  When laying supine, he is barely able to raise the extended leg bilaterally.  Lower Extremity:  Right  Left  Hip flexors  3-/5   3-/5   Hip extensors  4/5   4/5   Adductor 3/5  3/5  Abductor 4/5  4/5  Knee flexors  3/5   3/5   Knee extensors  4+/5   4+/5   Dorsiflexors  5/5   5/5   Plantarflexors  5/5   5/5   Toe extensors  5/5   5/5  Toe flexors  5/5   5/5   Tone (Ashworth scale)  0  0   MSRs:  Reflexes are 3+/4 throughout except 2+/4 bilateral Achilles  COORDINATION/GAIT:  He is able to stand up from chair without using arms to push off.  Waddling gait, somewhat unsteady.  He is able to stand on toes, and briefly on heels before becoming unsteady  Data: MRI cervical spine without contrast 02/17/2019: 1. 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 2. Solid anterior fusion at C4-5 and C5-6 without spinal stenosis at these levels. 3. Adjacent segment degenerative changes with moderate spinal stenosis at C6-7, mild-to-moderate spinal stenosis at C3-4, and mild-to-moderate neural foraminal stenosis at both levels.  MRI lumbar spine 02/17/2019: 1. Relatively diffuse lumbar disc and facet degeneration without significant spinal stenosis. 2. Multilevel neural foraminal stenosis, moderate on the left at L5-S1. 3. Normal appearance of the conus medullaris and cauda equina.  MRI thoracic spine without contrast 02/17/2019: 1. Mild thoracic disc and facet degeneration without evidence of neural impingement. 2. Minimal prominence of the central canal of the mid to lower thoracic spinal cord without syrinx.  MRI left femur with and without contrast 02/18/2019: 1. Abnormal diffuse muscular edema and enhancement involving various muscles of the lower pelvis and thigh as detailed above. The coronal images indicate that this is a  grossly symmetric process. I note that there is a plan for quadriceps muscle biopsy; I would recommend targeting the rectus femoris muscle and referring to today's MRI images given that the other quadriceps muscles demonstrate only minimal involvement. 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  CT chest w contrast 02/17/2019: 1. No CT evidence of malignancy in the chest, abdomen, or pelvis. 2. Innumerable tiny centrilobular pulmonary nodules in an apically predominant distribution, which may reflect sequelae of atypical infection or smoking-related respiratory bronchiolitis. There is an unchanged, benign somewhat bandlike consolidation in the right lung base. 3. Coronary artery disease. 4. Atherosclerosis. 5. Prostatomegaly.  Labs 02/17/2019:  Aldolase 122, anti-Jo neg, ANA neg, anti-Scl 70 neg, CRP 1.1, ESR 12, Ehrlichia antibody neg, Lyme neg, acute hepatitis panel neg, HIV neg, VGCC neg, paraneoplastic antibody panel neg 02/15/2019:  vitamin B12 162*, TSH 4.86*, free T4 0.78 Labs 08/2014 at [facility]:  CK 219, AST 61*, ALT 90*, alk phos 126* Left rectus femorus muscle biopsy 02/20/2019:  Active necrotizing inflammatory myopathy. 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  There is much greater degree of necrosis as compared to inflammation on his biopsy, which favors against inflammatory myositis such as dermatomyositis/polymyositis.  His exam continues to show limb-girdle pattern of weakness, worse in the  legs (3/5) especially with hip flexion, knee flexion, and adductors.  Distal strength is preserved.    Despite being on prednisone '80mg'$  daily, there has been no significant improvement, so the next step is to add IVIG and plan on a steroid-sparing medication.    PLAN/RECOMMENDATIONS:  1.  Start IVIG 2g/kg loading and 1/g every 21 days.  PA in process. 2.  Continue prednisone '80mg'$  daily.  Continue calcium, bactrim, and protonix 3.  Check CK, CMP, TPMT.  If TPMT is normal, will plan to  start azathioprine and monitor labs closely 4.  Check HbA1c to screen for diabetes 5.  Follow-up with myopathy panel from [facility] 6.  Start physical therapy .  Fall precautions discussed, encouraged to use a rollator as needed 7.  He has severe B12 deficiency and although unrelated to his myopathy, this can contribute to generalized fatigue.  I will start Vitamin B12 1060mg IM injection daily x 7 days, weekly x 4 weeks, then monthly thereafter x 1 year.  Return to clinic in 3 weeks  Greater than 50% of this 45 minute visit was spent in counseling, explanation of diagnosis, planning of further management, and coordination of care.   Thank you for allowing me to participate in patient's care.  If I can answer any additional questions, I would be pleased to do so.    Sincerely,     K. PPosey Pronto DO

## 2019-03-20 NOTE — Patient Instructions (Addendum)
Continue all your medications as you are  Start Vitamin B12 1037mcg IM injection daily x 7 days, weekly x 4 weeks, then monthly thereafter x 1 year.  I will follow-up with [facility] and your labs and let you know updates  Return to clinic on June 30th, 2020 at 3pm

## 2019-03-21 ENCOUNTER — Telehealth: Payer: Self-pay

## 2019-03-21 ENCOUNTER — Telehealth: Payer: Self-pay | Admitting: Neurology

## 2019-03-21 NOTE — Telephone Encounter (Signed)
PT's wife Pam calling she is asking if referral for pT can be sent to [facility] in [place]. Phone # is [phone] and fax # is [phone].

## 2019-03-21 NOTE — Telephone Encounter (Signed)
Please call pharm in [place] ([facility]) about B12 injections; wife saying they are not there. Thanks!

## 2019-03-21 NOTE — Telephone Encounter (Signed)
Faxed, demographics, last ov note and insurance card to [facility]

## 2019-03-21 NOTE — Telephone Encounter (Signed)
Demo, ins card, OV notes, faxed to [facility]

## 2019-03-21 NOTE — Telephone Encounter (Signed)
-----   Message from Alda Berthold, DO sent at 03/20/2019  6:25 PM EDT ----- Please order PT for leg strengthening and gait training. Thanks.

## 2019-03-21 NOTE — Telephone Encounter (Signed)
Spoke with Judeen Hammans at [facility] in [place]. B12 called in.

## 2019-03-22 ENCOUNTER — Telehealth: Payer: Self-pay

## 2019-03-22 NOTE — Telephone Encounter (Signed)
Received fax from [place] regarding Prior Auth on [employer].  [facility] request more information on the diagnosis of  inflammatory and immune myopathies.  Per Dr. Posey Pronto use   G72.49  autoimmue necritizing myopathy  Form faxed backed to [place] at [phone]

## 2019-03-23 ENCOUNTER — Encounter: Payer: Self-pay | Admitting: Family Medicine

## 2019-03-23 ENCOUNTER — Telehealth: Payer: Self-pay

## 2019-03-23 ENCOUNTER — Other Ambulatory Visit: Payer: Self-pay

## 2019-03-23 ENCOUNTER — Ambulatory Visit (INDEPENDENT_AMBULATORY_CARE_PROVIDER_SITE_OTHER): Payer: Medicare Other | Admitting: Family Medicine

## 2019-03-23 VITALS — BP 122/64 | HR 68 | Temp 98.6°F | Ht 69.0 in | Wt 177.0 lb

## 2019-03-23 DIAGNOSIS — R739 Hyperglycemia, unspecified: Secondary | ICD-10-CM

## 2019-03-23 DIAGNOSIS — G7249 Other inflammatory and immune myopathies, not elsewhere classified: Secondary | ICD-10-CM | POA: Diagnosis not present

## 2019-03-23 DIAGNOSIS — R740 Nonspecific elevation of levels of transaminase and lactic acid dehydrogenase [LDH]: Secondary | ICD-10-CM | POA: Diagnosis not present

## 2019-03-23 DIAGNOSIS — E118 Type 2 diabetes mellitus with unspecified complications: Secondary | ICD-10-CM | POA: Insufficient documentation

## 2019-03-23 DIAGNOSIS — R7401 Elevation of levels of liver transaminase levels: Secondary | ICD-10-CM

## 2019-03-23 DIAGNOSIS — T380X5A Adverse effect of glucocorticoids and synthetic analogues, initial encounter: Secondary | ICD-10-CM

## 2019-03-23 DIAGNOSIS — E119 Type 2 diabetes mellitus without complications: Secondary | ICD-10-CM | POA: Insufficient documentation

## 2019-03-23 DIAGNOSIS — I2510 Atherosclerotic heart disease of native coronary artery without angina pectoris: Secondary | ICD-10-CM

## 2019-03-23 LAB — GAMMA GT: GGT: 106 U/L — ABNORMAL HIGH (ref 7–51)

## 2019-03-23 LAB — PROTIME-INR
INR: 0.9 ratio (ref 0.8–1.0)
Prothrombin Time: 10.8 s (ref 9.6–13.1)

## 2019-03-23 MED ORDER — CALCIUM CARBONATE-VITAMIN D 500-200 MG-UNIT PO TABS: 1.0000 | ORAL_TABLET | Freq: Three times a day (TID) | ORAL | 3 refills | Status: DC

## 2019-03-23 MED ORDER — METFORMIN HCL 500 MG PO TABS: 500.0000 mg | ORAL_TABLET | Freq: Two times a day (BID) | ORAL | 3 refills | Status: DC

## 2019-03-23 NOTE — Assessment & Plan Note (Signed)
S: A1C of 7.0 on 03/20/2019. Patient on high dose prednisone  Blood sugars 113 yesterday morning and today (bought relion home meter).  A/P:  Fasting sugars reasonably controlled- will add metformin 500mg  BID and monitor CBGS Fr49m avs " Want morning sugars between 80-120 ideally. Perhaps check 3-4 days a week to make sure in that range. Ideally 2 hours after a meal sugar would be 180 or less if you want to do perhaps a spot check a week.   If you start getting a lot of #s over 180 please let us know. We can always increase metformin and as Michael Boston has mentioned this may not be enough and we may have to switch to insulin"  - I do not think ast/alt elevations are from liver disease but will get pt/inr and GGT to further evaluate liver damage- if these are elevated would consider closer follow up LFTs than august (can get labs in [place]). If this is liver disease would increase risk of lactic acidosis.   - since no history of diabetes- will hold off on labeling diabetes as only occurred in setting of high dose prednisone- we are hoping this can be titrated down

## 2019-03-23 NOTE — Patient Instructions (Addendum)
Please stop by lab before you go If you do not have mychart- we will call you about results within 5 business days of [place] receiving them.  If you have mychart- we will send your results within 3 business days of [place] receiving them.  If abnormal or we want to clarify a result, we will call or mychart you to make sure you receive the message.  If you have questions or concerns or don't hear within 5-7 days, please send [place] a message or call us.       Start metformin 500mg  twice a day  Want morning sugars between 80-120 ideally. Perhaps check 3-4 days a week to make sure in that range. Ideally 2 hours after a meal sugar would be 180 or less if you want to do perhaps a spot check a week.   If you start getting a lot of #s over 180 please let us know. We can always increase metformin and as Michael Boston has mentioned this may not be enough and we may have to switch to insulin

## 2019-03-23 NOTE — Progress Notes (Signed)
Phone [phone]   Subjective:  Olanrewaju Osborn is a 68 y.o. year old very pleasant male patient who presents for/with See problem oriented charting Chief Complaint  Patient presents with  . Follow-up   ROS- No fever, chills, cough, shortness of breath,  sore throat, or loss of taste or smell    Past Medical History-  Patient Active Problem List   Diagnosis Date Noted  . Steroid-induced hyperglycemia 03/23/2019    Priority: High  . 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 CAD (coronary artery disease), native coronary artery 07/13/2015    Priority: High  . S/P PTCA (percutaneous transluminal coronary angioplasty) 07/08/2015    Priority: High  . Hemiparesis ([place]) 09/18/2014    Priority: Medium  . BPH (benign prostatic hyperplasia) 09/18/2014    Priority: Medium  . Hyperlipidemia     Priority: Medium  . Bradycardia     Priority: Medium  . History of colonic polyps 10/15/2014    Priority: Low  . Transaminitis 09/18/2014    Priority: Low  . B12 deficiency 02/26/2019  . Proximal muscle weakness 02/26/2019  . Rhabdomyolysis 02/16/2019  . Weakness 02/16/2019  . Elevated PSA 02/25/2017  . Post PTCA 04/16/2016    Medications- reviewed and updated Current Outpatient Medications  Medication Sig Dispense Refill  . amoxicillin (AMOXIL) 500 MG capsule Take 2,000 mg by mouth See admin instructions. Take 4 capsules (2000 mg) by mouth one hours prior to dental appointments    . aspirin 81 MG chewable tablet Chew 1 tablet (81 mg total) by mouth at bedtime.    . calcium-vitamin D (OSCAL WITH D) 500-200 MG-UNIT tablet Take 1 tablet by mouth 3 (three) times daily. 270 tablet 3  . cyanocobalamin (,VITAMIN B-12,) 1000 MCG/ML injection Inject IM daily for 7 days, then weekly for 4 weeks, then monthly 10 mL 0  . finasteride (PROSCAR) 5 MG tablet Take 5 mg by mouth at bedtime.     . fluticasone (FLONASE) 50 MCG/ACT nasal spray Place 2 sprays into both nostrils daily  as needed for allergies or rhinitis.    Marland Kitchen nitroGLYCERIN (NITROSTAT) 0.4 MG SL tablet Place 1 tablet (0.4 mg total) under the tongue every 5 (five) minutes as needed for chest pain. 25 tablet 4  . pantoprazole (PROTONIX) 40 MG tablet Take 1 tablet (40 mg total) by mouth daily. 90 tablet 3  . Polyvinyl Alcohol-Povidone (REFRESH OP) Place 1 drop into both eyes daily.    Marland Kitchen PRALUENT 150 MG/ML SOAJ Inject in to skin every 2 weeks Currently on hold    . predniSONE (DELTASONE) 20 MG tablet Take 4 tablets (80 mg total) by mouth daily with breakfast. 120 tablet 3  . sulfamethoxazole-trimethoprim (BACTRIM DS) 800-160 MG tablet Take 1 tablet by mouth 3 (three) times a week. 40 tablet 3  . Syringe/Needle, Disp, (SYRINGE 3CC/22GX1") 22G X 1" 3 ML MISC Use with B12 injections 50 each 1  . tamsulosin (FLOMAX) 0.4 MG CAPS capsule TAKE 1 CAPSULE BY MOUTH ONCE DAILY WITH SUPPER (Patient taking differently: Take 0.4 mg by mouth at bedtime. ) 90 capsule 3   No current facility-administered medications for this visit.      Objective:  BP 122/64 (BP Location: Left Arm, Patient Position: Sitting, Cuff Size: Normal)   Pulse 68   Temp 98.6 F (37 C) (Oral)   Ht 5\' 9"  (1.753 m)   Wt 177 lb (80.3 kg)   SpO2 95%   BMI 26.14 kg/m  Gen: NAD,  resting comfortably CV: RRR no murmurs rubs or gallops Lungs: CTAB no crackles, wheeze, rhonchi Ext: no edema Skin: warm, dry Neuro: able to slowly stand from chair without using hand assist- appears to have some increased strength from last visit.     Assessment and Plan   1. Steroid-induced hyperglycemia S: A1C of 7.0 on 03/20/2019. Patient on high dose prednisone  Blood sugars 113 yesterday morning and today (bought relion home meter).  A/P:  Fasting sugars reasonably controlled- will add metformin 500mg  BID and monitor CBGS Fr58m avs " Want morning sugars between 80-120 ideally. Perhaps check 3-4 days a week to make sure in that range. Ideally 2 hours after a meal  sugar would be 180 or less if you want to do perhaps a spot check a week.   If you start getting a lot of #s over 180 please let us know. We can always increase metformin and as Michael Boston has mentioned this may not be enough and we may have to switch to insulin"  - I do not think ast/alt elevations are from liver disease but will get pt/inr and GGT to further evaluate liver damage- if these are elevated would consider closer follow up LFTs than august (can get labs in [place]). If this is liver disease would increase risk of lactic acidosis.   - since no history of diabetes- will hold off on labeling diabetes as only occurred in setting of high dose prednisone- we are hoping this can be titrated down   # autoimmune necrotizing myopathy- we wrote for 5 year handicap placard as not sure how quickly this will improve  - may check a1c somewhat early in august Future Appointments  Date Time Provider [place]  04/10/2019  3:00 PM Alda Berthold, DO [facility] None  06/07/2019  2:20 PM Marin Olp, MD [facility]  06/08/2019  2:00 PM Adrian Prows, MD [facility] None    Lab/Order associations:   ICD-10-CM   1. Steroid-induced hyperglycemia  R73.9 Protime-INR   T38.0X5A   2. Elevated AST (SGOT)  R74.0 Gamma GT    Protime-INR  3. Elevated ALT measurement  R74.0 Gamma GT    Protime-INR  4. Autoimmune necrotizing myopathy  G72.49     Meds ordered this encounter  Medications  . metFORMIN (GLUCOPHAGE) 500 MG tablet    Sig: Take 1 tablet (500 mg total) by mouth 2 (two) times daily with a meal.    Dispense:  180 tablet    Refill:  3   Return precautions advised.  Garret Reddish, MD

## 2019-03-23 NOTE — Telephone Encounter (Signed)
Faxed demographics for billing purposes to [place]  [phone]

## 2019-03-24 ENCOUNTER — Encounter: Payer: Self-pay | Admitting: Family Medicine

## 2019-03-24 DIAGNOSIS — R7401 Elevation of levels of liver transaminase levels: Secondary | ICD-10-CM

## 2019-03-25 LAB — COMPLETE METABOLIC PANEL WITH GFR
AG Ratio: 1.4 (calc) (ref 1.0–2.5)
ALT: 642 U/L — ABNORMAL HIGH (ref 9–46)
AST: 285 U/L — ABNORMAL HIGH (ref 10–35)
Albumin: 3.9 g/dL (ref 3.6–5.1)
Alkaline phosphatase (APISO): 67 U/L (ref 35–144)
BUN/Creatinine Ratio: 33 (calc) — ABNORMAL HIGH (ref 6–22)
BUN: 20 mg/dL (ref 7–25)
CO2: 29 mmol/L (ref 20–32)
Calcium: 9.8 mg/dL (ref 8.6–10.3)
Chloride: 94 mmol/L — ABNORMAL LOW (ref 98–110)
Creat: 0.61 mg/dL — ABNORMAL LOW (ref 0.70–1.25)
GFR, Est African American: 121 mL/min/{1.73_m2} (ref >=60)
GFR, Est Non African American: 105 mL/min/{1.73_m2} (ref >=60)
Globulin: 2.7 g/dL (calc) (ref 1.9–3.7)
Glucose, Bld: 207 mg/dL — ABNORMAL HIGH (ref 65–99)
Potassium: 5 mmol/L (ref 3.5–5.3)
Sodium: 134 mmol/L — ABNORMAL LOW (ref 135–146)
Total Bilirubin: 0.6 mg/dL (ref 0.2–1.2)
Total Protein: 6.6 g/dL (ref 6.1–8.1)

## 2019-03-25 LAB — HEMOGLOBIN A1C
Hgb A1c MFr Bld: 7 % of total Hgb — ABNORMAL HIGH (ref <5.7)
Mean Plasma Glucose: 154 (calc)
eAG (mmol/L): 8.5 (calc)

## 2019-03-25 LAB — CK: Total CK: 7372 U/L — ABNORMAL HIGH (ref 44–196)

## 2019-03-25 LAB — THIOPURINE METHYLTRANSFERASE (TPMT), RBC: Thiopurine Methyltransferase, RBC: 7 nmol/hr/mL RBC — ABNORMAL LOW

## 2019-03-26 ENCOUNTER — Other Ambulatory Visit: Payer: Self-pay

## 2019-03-26 ENCOUNTER — Telehealth: Payer: Self-pay | Admitting: Neurology

## 2019-03-26 ENCOUNTER — Other Ambulatory Visit: Payer: Self-pay | Admitting: Physical Therapy

## 2019-03-26 DIAGNOSIS — G7249 Other inflammatory and immune myopathies, not elsewhere classified: Secondary | ICD-10-CM

## 2019-03-26 NOTE — Telephone Encounter (Signed)
PA for the Infusion; she is wanting to speak with you about this. And get some updates - Please call her back at [phone]. Thanks!

## 2019-03-26 NOTE — Telephone Encounter (Signed)
[facility] is needing a prescription for the PT for his appt in the morning. Please fax this prescription to [phone]. Thanks!

## 2019-03-26 NOTE — Telephone Encounter (Signed)
Order faxed.

## 2019-03-27 NOTE — Telephone Encounter (Signed)
Received fax from [place] 03/26/19. I faxed ICD 10 code of pts dx along with OV notes, labs and recent MRI.  Will await appeal response.

## 2019-03-28 ENCOUNTER — Telehealth: Payer: Self-pay | Admitting: Neurology

## 2019-03-28 NOTE — Telephone Encounter (Signed)
Referral form completed for Prescription to be sent to [place]. Dr. Posey Pronto needs to sign. She is out of the office today. Will have to wait and fax tomorrow.  Erica from [place] has yet to return call. I have left another message for her.

## 2019-03-28 NOTE — Telephone Encounter (Signed)
Rose Is calling in from [facility] about the IV treatment. There is an issues with insurance/ pharm. She is calling about medicare part D. Is this something under part D coverage and is anyone helping the member get it through them. Not covered in a home setting. Please call her back at [phone] or provider services line [phone]. Infusion pharm [facility] #: [id number].  [facility] is saying its billing for part D and then the part D is saying its not covered from [employer]. So there is some confusion.   Thanks!

## 2019-03-28 NOTE — Telephone Encounter (Signed)
Wife is calling in again after you sent her a message wanting to speak with you. Thanks!

## 2019-03-28 NOTE — Telephone Encounter (Signed)
I am working on a prior auth with [facility] for IVIG. Waiting on Dr. Posey Pronto to return to office tomorrow for signature.

## 2019-03-28 NOTE — Telephone Encounter (Signed)
I have yet to receive a response from [facility].  After speaking with pts wife she believes that the approval needs to be addressed with [facility]. Which was previously known as [facility]. Pts wife stated that she spoke with Danae Chen at [place] and was given a phone number [phone].  Called this morning. Erica's ext is [phone]. Voicemail left for her to return call. Fax number left as well. Hopefully she can fax over some information or a prior auth form to get IVIG approved.  Will inform pt via my chart of this.

## 2019-03-28 NOTE — Telephone Encounter (Signed)
Left message for Stanley Boyd with [facility] to return my call

## 2019-03-28 NOTE — Telephone Encounter (Signed)
Wife is calling in about Home Health PT and IVIG info. Please call her back at [phone]. Thanks!

## 2019-03-29 ENCOUNTER — Telehealth: Payer: Self-pay

## 2019-03-29 ENCOUNTER — Other Ambulatory Visit: Payer: Self-pay

## 2019-03-29 ENCOUNTER — Encounter: Payer: Self-pay | Admitting: Gastroenterology

## 2019-03-29 ENCOUNTER — Other Ambulatory Visit
Admission: RE | Admit: 2019-03-29 | Discharge: 2019-03-29 | Disposition: A | Discharge status: Home / Self Care | Payer: Medicare Other | Attending: Gastroenterology | Admitting: Gastroenterology

## 2019-03-29 ENCOUNTER — Ambulatory Visit (INDEPENDENT_AMBULATORY_CARE_PROVIDER_SITE_OTHER): Payer: Medicare Other | Admitting: Gastroenterology

## 2019-03-29 ENCOUNTER — Telehealth: Payer: Self-pay | Admitting: Neurology

## 2019-03-29 VITALS — BP 112/67 | HR 69 | Temp 98.0°F | Ht 69.0 in | Wt 169.8 lb

## 2019-03-29 DIAGNOSIS — R531 Weakness: Secondary | ICD-10-CM

## 2019-03-29 DIAGNOSIS — R748 Abnormal levels of other serum enzymes: Secondary | ICD-10-CM

## 2019-03-29 DIAGNOSIS — G729 Myopathy, unspecified: Secondary | ICD-10-CM

## 2019-03-29 LAB — CBC WITH DIFFERENTIAL/PLATELET
Abs Immature Granulocytes: 0.2 10*3/uL — ABNORMAL HIGH (ref 0.00–0.07)
Basophils Absolute: 0 10*3/uL (ref 0.0–0.1)
Basophils Relative: 0 %
Eosinophils Absolute: 0 10*3/uL (ref 0.0–0.5)
Eosinophils Relative: 0 %
HCT: 48.6 % (ref 39.0–52.0)
Hemoglobin: 16.5 g/dL (ref 13.0–17.0)
Immature Granulocytes: 2 %
Lymphocytes Relative: 11 %
Lymphs Abs: 1.3 10*3/uL (ref 0.7–4.0)
MCH: 31 pg (ref 26.0–34.0)
MCHC: 34 g/dL (ref 30.0–36.0)
MCV: 91.2 fL (ref 80.0–100.0)
Monocytes Absolute: 0.3 10*3/uL (ref 0.1–1.0)
Monocytes Relative: 2 %
Neutro Abs: 10 10*3/uL — ABNORMAL HIGH (ref 1.7–7.7)
Neutrophils Relative %: 85 %
Platelets: 264 10*3/uL (ref 150–400)
RBC: 5.33 MIL/uL (ref 4.22–5.81)
RDW: 14 % (ref 11.5–15.5)
WBC: 11.8 10*3/uL — ABNORMAL HIGH (ref 4.0–10.5)
nRBC: 0 % (ref 0.0–0.2)

## 2019-03-29 LAB — HEPATIC FUNCTION PANEL
ALT: 609 U/L — ABNORMAL HIGH (ref 0–44)
AST: 365 U/L — ABNORMAL HIGH (ref 15–41)
Albumin: 3.7 g/dL (ref 3.5–5.0)
Alkaline Phosphatase: 54 U/L (ref 38–126)
Bilirubin, Direct: 0.1 mg/dL (ref 0.0–0.2)
Indirect Bilirubin: 0.7 mg/dL (ref 0.3–0.9)
Total Bilirubin: 0.8 mg/dL (ref 0.3–1.2)
Total Protein: 6.8 g/dL (ref 6.5–8.1)

## 2019-03-29 LAB — PROTIME-INR
INR: 0.9 (ref 0.8–1.2)
Prothrombin Time: 11.9 seconds (ref 11.4–15.2)

## 2019-03-29 NOTE — Telephone Encounter (Signed)
This is regarding wife just called back needing to give you more info. I told her you were in with Dr. Posey Pronto discussing situation. She said you can call her back at her cell # or the home phone #. Thanks so much and good luck!

## 2019-03-29 NOTE — Telephone Encounter (Signed)
Stanley Boyd called to say she has been informed to contact the Provider medical coverage phone number NOT PART D. Pam states insurance will cover IV IG if the Josem Kaufmann is obtained on the medical side instead of the part D pharmacy side.  Call the provider services phone number on the back of the insurance card.

## 2019-03-29 NOTE — Telephone Encounter (Signed)
Please provide DIAGNOSIS for Part D to cover infusion contact [facility] (pts part D) to provide DIAGNOSIS For approval. # [phone]

## 2019-03-29 NOTE — Telephone Encounter (Signed)
Spoke with [facility] yesterday afternoon about 4:15. They will fax appeal forms immediately.  Forms on Dr. Serita Grit Desk this morning. Needs to signed and additional info added to help authorization for [employer] solution.  Also, received a my chart message stating that I needed to contact [place] (pts part D)  For approval. # [phone]
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
## 2019-03-29 NOTE — Telephone Encounter (Signed)
Left message for Asencion Partridge to return call

## 2019-03-29 NOTE — Telephone Encounter (Signed)
Pts Gammagard Solution per Dr. Posey Pronto needs to be authorized through [facility].  I have been instructed to contacted [place] with [employer]. A message has been left for Asencion Partridge to return my call.

## 2019-03-29 NOTE — Telephone Encounter (Signed)
Spoke with [employer]. It is best to communicate through email.  Her email is [email or web address]  She gave instructions to complete [employer] form which will be changing to [employer] infusion form later next month.  Form completed and faxed along with pt info.  She will send email with updates on approval from [employer].

## 2019-03-29 NOTE — Telephone Encounter (Signed)
New Order for Home PT/OT faxed to [facility] at [phone]  Dx: Generalized weakness and Myopathy

## 2019-03-30 LAB — MITOCHONDRIAL ANTIBODIES: Mitochondrial M2 Ab, IgG: <20 Units (ref 0.0–20.0)

## 2019-03-30 LAB — HEPATITIS A ANTIBODY, TOTAL: hep A Total Ab: NEGATIVE

## 2019-03-30 LAB — HEPATITIS B SURFACE ANTIBODY,QUALITATIVE: Hep B S Ab: NONREACTIVE

## 2019-04-02 ENCOUNTER — Other Ambulatory Visit: Payer: Self-pay

## 2019-04-02 DIAGNOSIS — G7249 Other inflammatory and immune myopathies, not elsewhere classified: Secondary | ICD-10-CM

## 2019-04-02 DIAGNOSIS — R531 Weakness: Secondary | ICD-10-CM

## 2019-04-02 DIAGNOSIS — G729 Myopathy, unspecified: Secondary | ICD-10-CM

## 2019-04-03 ENCOUNTER — Telehealth: Payer: Self-pay | Admitting: Neurology

## 2019-04-03 ENCOUNTER — Other Ambulatory Visit: Payer: Self-pay

## 2019-04-03 MED ORDER — IMMUNE GLOBULIN (HUMAN) 5 GM/50ML IV SOLN: 1.0000 g/kg | INTRAVENOUS | 0 refills | Status: DC

## 2019-04-03 NOTE — Telephone Encounter (Signed)
Gammagard has been denied twice.  We have filed an appeal.  I called yesterday to [employer] ( [facility]) and was unable to get through.  Called again today and left message for them to return the call.

## 2019-04-03 NOTE — Addendum Note (Signed)
Addended by: Alphonzo Dublin on: 04/03/2019 08:21 AM   Modules accepted: Orders

## 2019-04-03 NOTE — Telephone Encounter (Signed)
Lady from [employer] is calling you back about this patient. Please call her at [phone]. Thanks!

## 2019-04-04 ENCOUNTER — Other Ambulatory Visit: Payer: Self-pay

## 2019-04-04 ENCOUNTER — Other Ambulatory Visit (HOSPITAL_COMMUNITY): Payer: Self-pay | Admitting: General Practice

## 2019-04-04 ENCOUNTER — Telehealth: Payer: Self-pay | Admitting: Neurology

## 2019-04-04 MED ORDER — IMMUNE GLOBULIN (HUMAN) 5 GM/50ML IV SOLN: 1.0000 g/kg | INTRAVENOUS | 11 refills | Status: DC

## 2019-04-04 MED ORDER — IMMUNE GLOBULIN (HUMAN) 5 GM/50ML IV SOLN: 1.0000 g/kg | INTRAVENOUS | 0 refills | Status: DC

## 2019-04-04 NOTE — Telephone Encounter (Signed)
Per Dr. Posey Pronto the hospital will manage the Infusion insurance authorization since the procedure will be performed at the hospital.  Orders have been place through admissions in Short Stay at [facility] with [facility].  The pt has 1st infusion 04/16/19 at 8am and needs arrive 15 minutes early in admitting at [facility]  The 2nd infusion is scheduled for 04/17/19 at 9:30. Pt is to arrive at 9:15. Same location.  No other dates were given at this time. Orders have been placed to have next infusions every 21 days per Dr. Serita Grit orders.  Spoke with Mr. Gayden and made him aware of the appts and that he would not be able to have anyone with him the day of the infusions and to expect to be at the hospital up to 6 hours. He understood.

## 2019-04-04 NOTE — Telephone Encounter (Signed)
Tressia Danas PT at [facility] health: Plan of care 1 wk one. 2 wk three 1 wk two One every other week 3. Suezanne Jacquet [phone] Suezanne Jacquet called to inform Posey Pronto of plan of care.

## 2019-04-04 NOTE — Telephone Encounter (Signed)
Noted  

## 2019-04-06 ENCOUNTER — Ambulatory Visit
Admission: RE | Admit: 2019-04-06 | Discharge: 2019-04-06 | Disposition: A | Discharge status: Home / Self Care | Payer: Medicare Other | Source: Ambulatory Visit | Attending: Gastroenterology | Admitting: Gastroenterology

## 2019-04-06 ENCOUNTER — Other Ambulatory Visit: Payer: Self-pay

## 2019-04-06 DIAGNOSIS — Z7984 Long term (current) use of oral hypoglycemic drugs: Secondary | ICD-10-CM | POA: Insufficient documentation

## 2019-04-06 DIAGNOSIS — I2510 Atherosclerotic heart disease of native coronary artery without angina pectoris: Secondary | ICD-10-CM | POA: Insufficient documentation

## 2019-04-06 DIAGNOSIS — R001 Bradycardia, unspecified: Secondary | ICD-10-CM | POA: Diagnosis not present

## 2019-04-06 DIAGNOSIS — Z7982 Long term (current) use of aspirin: Secondary | ICD-10-CM | POA: Diagnosis not present

## 2019-04-06 DIAGNOSIS — R748 Abnormal levels of other serum enzymes: Secondary | ICD-10-CM | POA: Diagnosis present

## 2019-04-06 DIAGNOSIS — Z79899 Other long term (current) drug therapy: Secondary | ICD-10-CM | POA: Diagnosis not present

## 2019-04-06 DIAGNOSIS — R7989 Other specified abnormal findings of blood chemistry: Secondary | ICD-10-CM | POA: Insufficient documentation

## 2019-04-06 DIAGNOSIS — E785 Hyperlipidemia, unspecified: Secondary | ICD-10-CM | POA: Insufficient documentation

## 2019-04-06 LAB — CBC
HCT: 46.7 % (ref 39.0–52.0)
Hemoglobin: 15.6 g/dL (ref 13.0–17.0)
MCH: 30.1 pg (ref 26.0–34.0)
MCHC: 33.4 g/dL (ref 30.0–36.0)
MCV: 90 fL (ref 80.0–100.0)
Platelets: 254 10*3/uL (ref 150–400)
RBC: 5.19 MIL/uL (ref 4.22–5.81)
RDW: 13.8 % (ref 11.5–15.5)
WBC: 11 10*3/uL — ABNORMAL HIGH (ref 4.0–10.5)
nRBC: 0 % (ref 0.0–0.2)

## 2019-04-06 LAB — PROTIME-INR
INR: 0.9 (ref 0.8–1.2)
Prothrombin Time: 12.1 seconds (ref 11.4–15.2)

## 2019-04-06 LAB — GLUCOSE, CAPILLARY: Glucose-Capillary: 93 mg/dL (ref 70–99)

## 2019-04-06 MED ORDER — MIDAZOLAM HCL 5 MG/5ML IJ SOLN
INTRAMUSCULAR | Status: AC
  Filled 2019-04-06: qty 5

## 2019-04-06 MED ORDER — FENTANYL CITRATE (PF) 100 MCG/2ML IJ SOLN
INTRAMUSCULAR | Status: AC | PRN
  Administered 2019-04-06: 50 ug via INTRAVENOUS
  Administered 2019-04-06: 25 ug via INTRAVENOUS

## 2019-04-06 MED ORDER — MIDAZOLAM HCL 5 MG/5ML IJ SOLN
INTRAMUSCULAR | Status: AC | PRN
  Administered 2019-04-06 (×3): 1 mg via INTRAVENOUS

## 2019-04-06 MED ORDER — FENTANYL CITRATE (PF) 100 MCG/2ML IJ SOLN
INTRAMUSCULAR | Status: AC
  Filled 2019-04-06: qty 4

## 2019-04-06 MED ORDER — SODIUM CHLORIDE 0.9 % IV SOLN
INTRAVENOUS | Status: DC
  Administered 2019-04-06: 11:00:00 via INTRAVENOUS
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
## 2019-04-06 NOTE — Procedures (Signed)
Pre Procedure Dx: Elevated LFTs Post Procedural Dx: Same  Technically successful US guided biopsy of right lobe of the liver.  EBL: None  No immediate complications.   Jay Yohana Bartha, MD Pager #: [phone]    

## 2019-04-09 ENCOUNTER — Encounter: Payer: Self-pay | Admitting: Family Medicine

## 2019-04-10 ENCOUNTER — Ambulatory Visit (INDEPENDENT_AMBULATORY_CARE_PROVIDER_SITE_OTHER): Payer: Medicare Other | Admitting: Neurology

## 2019-04-10 ENCOUNTER — Other Ambulatory Visit: Payer: Self-pay

## 2019-04-10 ENCOUNTER — Other Ambulatory Visit (INDEPENDENT_AMBULATORY_CARE_PROVIDER_SITE_OTHER): Payer: Medicare Other

## 2019-04-10 ENCOUNTER — Encounter: Payer: Self-pay | Admitting: Neurology

## 2019-04-10 VITALS — BP 117/61 | HR 70 | Ht 69.0 in | Wt 176.0 lb

## 2019-04-10 DIAGNOSIS — G7249 Other inflammatory and immune myopathies, not elsewhere classified: Secondary | ICD-10-CM

## 2019-04-10 DIAGNOSIS — Z5181 Encounter for therapeutic drug level monitoring: Secondary | ICD-10-CM | POA: Diagnosis not present

## 2019-04-10 DIAGNOSIS — E538 Deficiency of other specified B group vitamins: Secondary | ICD-10-CM | POA: Diagnosis not present

## 2019-04-10 LAB — SURGICAL PATHOLOGY

## 2019-04-10 MED ORDER — PREDNISONE 20 MG PO TABS: ORAL_TABLET | ORAL | 3 refills | Status: DC

## 2019-04-10 NOTE — Progress Notes (Signed)
Follow-up Visit   Date: 04/10/19   Stanley Boyd MRN: [id number] DOB: 16-Jan-1951   Interim History: Stanley Boyd is a 68 y.o. 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  The patient was accompanied to the clinic by son, Christie Copley (physician) who also provides collateral information.    History of present illness: Early April 2020, he began having difficulty walking, especially raising his legs, such as when getting into a car.  This gradually progressed over the next 4 weeks to the point where he was unable to raise his leg to climb onto a step. Around the same time, he developed a skin rash of the chest and forearm.  He was evaluated by his PCP who noted 4-/5 muscle strength in the legs and markedly [place] at 14[address].  He was hospitalized at [facility] from 5/8 - 02/20/2019 for evaluation and hydration.  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  MRI left femur was most notable with diffuse, symmetric muscle edema and enhancement in the proximal hip girdle muscles. CK peaked at 176[address]. Left rectus femoris muscle biopsy and skin biopsy was performed.  He was emperically started on prednisone '80mg'$  due to high clinical suspicion for inflammatory myopathy, despite normal inflammatory/autoimmune markers (see below).  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  Skin biopsy showed benign lesion with fibrosis and mixed superficial and deep inflammation.  He has a history of CAD, followed by Dr. Einar Gip.  He was previously on statin therapy for ~ 2 years, but due to elevated liver enzymes, he was switched Praluent in 2019.  His LFTs have been  elevated in the past, the oldest labs in Epic are from November 2015 at [facility] which showed CK 219, AST 61*, ALT 90*, alk phos 126*.  He denies history of myalgias or cramps.    UPDATE 03/20/2019:  He is here for 2 week follow-up with his son, Dr. Thersa Salt.  He remains on prednisone '80mg'$  daily and CK last week was elevated at 7703.  He continues to have weakness of the arms and legs, no significant improvement despite being on prednisone '80mg'$  for almost a month. Weakness involves his upper arms and legs.  For example, applying deodorant is challenging because of inability to extend the arm and climb steps/stairs.  He is having greater difficulty walking, but is still walking unassisted. Fortunately, he has not suffered falls since his last visit.    UPDATE 04/10/2019:  He is here for 3 week follow-up visit.  He denies any significant change in degree of his weakness in the arms or legs since his last visit.  Specifically, he does not feel any symptoms have progressed.  His legs continue to feel very heavy and he suffered one fall and he was unable to stand up himself.  He is able to walk unassisted.  There has been delay in trying to get his IVIG approved due to his Medicare part B declining home infusion.  He is now scheduled to have this administered at the hospital next week.  He denies neck heaviness, dysphagia, or dysarthria.  [facility] labs have returned markedly positive for HMGCR antibodies (see below).     Medications:  Current Outpatient Medications  on File Prior to Visit  Medication Sig Dispense Refill  . aspirin 81 MG chewable tablet Chew 1 tablet (81 mg total) by mouth at bedtime.    . calcium-vitamin D (OSCAL WITH D) 500-200 MG-UNIT tablet Take 1 tablet by mouth 3 (three) times daily. 270 tablet 3  . cyanocobalamin (,VITAMIN B-12,) 1000 MCG/ML injection Inject IM daily for 7 days, then weekly for 4 weeks, then monthly 10 mL 0  . finasteride (PROSCAR) 5 MG tablet  Take 5 mg by mouth at bedtime.     . fluticasone (FLONASE) 50 MCG/ACT nasal spray Place 2 sprays into both nostrils daily as needed for allergies or rhinitis.    . metFORMIN (GLUCOPHAGE) 500 MG tablet Take 1 tablet (500 mg total) by mouth 2 (two) times daily with a meal. 180 tablet 3  . nitroGLYCERIN (NITROSTAT) 0.4 MG SL tablet Place 1 tablet (0.4 mg total) under the tongue every 5 (five) minutes as needed for chest pain. 25 tablet 4  . pantoprazole (PROTONIX) 40 MG tablet Take 1 tablet (40 mg total) by mouth daily. 90 tablet 3  . Polyvinyl Alcohol-Povidone (REFRESH OP) Place 1 drop into both eyes daily.    Marland Kitchen PRALUENT 150 MG/ML SOAJ Inject in to skin every 2 weeks Currently on hold    . predniSONE (DELTASONE) 20 MG tablet Take 4 tablets (80 mg total) by mouth daily with breakfast. 120 tablet 3  . sulfamethoxazole-trimethoprim (BACTRIM DS) 800-160 MG tablet Take 1 tablet by mouth 3 (three) times a week. 40 tablet 3  . Syringe/Needle, Disp, (SYRINGE 3CC/22GX1") 22G X 1" 3 ML MISC Use with B12 injections 50 each 1  . tamsulosin (FLOMAX) 0.4 MG CAPS capsule Take 0.4 mg by mouth at bedtime.     No current facility-administered medications on file prior to visit.     Allergies: No Known Allergies  Review of Systems:  CONSTITUTIONAL: No fevers, chills, night sweats, or weight loss.  EYES: No visual changes or eye pain ENT: No hearing changes.  No history of nose bleeds.   RESPIRATORY: No cough, wheezing and shortness of breath.   CARDIOVASCULAR: Negative for chest pain, and palpitations.   GI: Negative for abdominal discomfort, blood in stools or black stools.  No recent change in bowel habits.   GU:  No history of incontinence.   MUSCLOSKELETAL: No history of joint pain or swelling.  No myalgias.   SKIN: Negative for lesions, rash, and itching.   ENDOCRINE: Negative for cold or heat intolerance, polydipsia or goiter.   PSYCH:  No depression or anxiety symptoms.   NEURO: As Above.   Vital  Signs:  BP 117/61   Pulse 70   Wt 176 lb (79.8 kg)   SpO2 97%   BMI 25.99 kg/m   Neurological Exam: MENTAL STATUS including orientation to time, place, person, recent and remote memory, attention span and concentration, language, and fund of knowledge is normal.  Speech is not dysarthric.  CRANIAL NERVES:  Pupils equal round and reactive to light.  Normal conjugate, extra-ocular eye movements in all directions of gaze.  No ptosis.  Face is symmetric. Facial muscles are 5/5 bilaterally.  Palate elevates symmetrically.  Tongue is midline and strength is 5/5.  MOTOR:  Mild proximal leg atrophy, No fasciculations or abnormal movements.  No pronator drift.  Neck flexion and extension is 5/5.   Upper Extremity:  Right  Left  Deltoid  4+/5   4+/5   Biceps  5/5   5/5  Triceps * 5-/5   5-/5   Infraspinatus * 4/5  4/5  Medial pectoralis * 4/5  4/5  Wrist extensors  5-/5   5/5   Wrist flexors  5-/5   5/5   Finger extensors  4/5   5/5   Finger flexors  4/5   5/5   Dorsal interossei  4/5   5/5   Abductor pollicis  4/5   5/5   Tone (Ashworth scale)  0  0  When laying supine, he is unable to raise the extended leg bilaterally.  Lower Extremity:  Right  Left  Hip flexors  2+/5   2+/5   Hip extensors  4/5   4/5   Adductor 3/5  3/5  Abductor 4/5  4/5  Knee flexors  3/5   3/5   Knee extensors  4+/5   4+/5   Dorsiflexors  5/5   5/5   Plantarflexors  5/5   5/5   Toe extensors  5/5   5/5   Toe flexors  5/5   5/5   Tone (Ashworth scale)  0  0   MSRs:  Reflexes are 3+/4 throughout except 2+/4 bilateral Achilles  COORDINATION/GAIT:  He is able to stand up from chair without using arms to push off.  Waddling gait, somewhat unsteady.   Data: MRI cervical spine without contrast 02/17/2019: 1. 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 2. Solid anterior fusion at C4-5 and C5-6 without spinal stenosis at these levels. 3. Adjacent segment degenerative changes with moderate spinal stenosis at C6-7, mild-to-moderate  spinal stenosis at C3-4, and mild-to-moderate neural foraminal stenosis at both levels.  MRI lumbar spine 02/17/2019: 1. Relatively diffuse lumbar disc and facet degeneration without significant spinal stenosis. 2. Multilevel neural foraminal stenosis, moderate on the left at L5-S1. 3. Normal appearance of the conus medullaris and cauda equina.  MRI thoracic spine without contrast 02/17/2019: 1. Mild thoracic disc and facet degeneration without evidence of neural impingement. 2. Minimal prominence of the central canal of the mid to lower thoracic spinal cord without syrinx.  MRI left femur with and without contrast 02/18/2019: 1. Abnormal diffuse muscular edema and enhancement involving various muscles of the lower pelvis and thigh as detailed above. The coronal images indicate that this is a grossly symmetric process. I note that there is a plan for quadriceps muscle biopsy; I would recommend targeting the rectus femoris muscle and referring to today's MRI images given that the other quadriceps muscles demonstrate only minimal involvement. 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  CT chest w contrast 02/17/2019: 1. No CT evidence of malignancy in the chest, abdomen, or pelvis. 2. Innumerable tiny centrilobular pulmonary nodules in an apically predominant distribution, which may reflect sequelae of atypical infection or smoking-related respiratory bronchiolitis. There is an unchanged, benign somewhat bandlike consolidation in the right lung base. 3. Coronary artery disease. 4. Atherosclerosis. 5. Prostatomegaly.  Labs 02/17/2019:  Aldolase 122, anti-Jo neg, ANA neg, anti-Scl 70  neg, CRP 1.1, ESR 12, Ehrlichia antibody neg, Lyme neg, acute hepatitis panel neg, HIV neg, VGCC neg, paraneoplastic antibody panel neg 02/15/2019:  vitamin B12 162*, TSH 4.86*, free T4 0.78  Labs 08/2014 at [facility]:  CK 219, AST 61*, ALT 90*, alk phos 126*  Component  CK Total  Latest Ref Rng & Units      44 - 196 U/L  02/15/2019      14,781 (H)  02/16/2019     2:00 PM 16,516 (H)  02/16/2019     11:21 PM 17,630 (H)  02/17/2019      16,916 (H)  02/18/2019     12:42 AM 16,393 (H)  02/18/2019     12:15 PM 15,260 (H)  02/19/2019     12:12 AM 12,702 (H)  02/19/2019     7:01 AM 12,575 (H)  02/20/2019      11,953 (H)  02/26/2019      6,626 (H)  03/20/2019      7,372 (H)   Left rectus femorus muscle biopsy 02/20/2019:  Active necrotizing inflammatory myopathy. 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  He has been off statin therapy for almost 1 year.  With his unexplained transaminitis over the past few years, it is likely that he had low grade muscle injury ongoing before he presents with fulminant rhabdomyolysis in May 2020.   Clinically, he continues to have limb-girdle distribution of weakness, worse in the legs where he is 2+/5 at the hip flexors.  There is mild improved strength in the medial pectoralis, infraspinatus, and triceps muscles as compared to previously.    I had extensive discussion with the patient regarding the pathogenesis, etiology, management, and natural course of autoimmune myopathy. I also counseled her on medication management as well as side effects and need for ongoing monitoring.  He will need long-term immunosuppression with tapering prednisone, monthly IVIG, and  Cellcept going forward.   PLAN/RECOMMENDATIONS:  1.  Start IVIG 2g/kg loading and 1/g every 21 days - this has been scheduled for next week 2.  Reduce prednisone '70mg'$  daily x 2 weeks, then '60mg'$  daily.  Continue calcium, bactrim, and protonix 3.  Check CK, CMP 4.  Once GI evaluation is complete, plan to start Cellcept '500mg'$  BID.  Monitor labs closely.  Unable to use MTX due to transaminitis and he is a poor metabolizer of azathioprine 5.  Continue home PT 6.  Continue vitamin B12 injections weekly x 4 weeks, then monthly 7.  He will be seeking second opinion at [facility] next week  Return to clinic in 4 weeks   Thank you for allowing me to participate in patient's care.  If I can answer any additional questions, I would be pleased to do so.    Sincerely,    Danashia Landers K. Posey Pronto, DO

## 2019-04-10 NOTE — Patient Instructions (Addendum)
Check labs today - CK and CMP  Reduce prednisone to 70mg  (3.5 tablets) x 2 weeks, then 60mg  daily.  Stay on prednisone 60mg  until you see me again  Start IVIG next week  Continue weekly B12 injections for total of 4 weeks, then every month  Return to clinic in 4 weeks  Your provider has requested that you have labwork completed today. Please go to [facility] ([phone]) on the second floor of this building before leaving the office today. You do not need to check in. If you are not called within 15 minutes please check with the front desk.

## 2019-04-11 LAB — COMPREHENSIVE METABOLIC PANEL
ALT: 502 U/L — ABNORMAL HIGH (ref 0–53)
AST: 258 U/L — ABNORMAL HIGH (ref 0–37)
Albumin: 4 g/dL (ref 3.5–5.2)
Alkaline Phosphatase: 52 U/L (ref 39–117)
BUN: 18 mg/dL (ref 6–23)
CO2: 31 mEq/L (ref 19–32)
Calcium: 9.8 mg/dL (ref 8.4–10.5)
Chloride: 95 mEq/L — ABNORMAL LOW (ref 96–112)
Creatinine, Ser: 0.62 mg/dL (ref 0.40–1.50)
GFR: 129.8 mL/min (ref >60.00)
Glucose, Bld: 133 mg/dL — ABNORMAL HIGH (ref 70–99)
Potassium: 5.4 mEq/L — ABNORMAL HIGH (ref 3.5–5.1)
Sodium: 135 mEq/L (ref 135–145)
Total Bilirubin: 0.4 mg/dL (ref 0.2–1.2)
Total Protein: 6.8 g/dL (ref 6.0–8.3)

## 2019-04-11 LAB — CK: Total CK: 5953 U/L — ABNORMAL HIGH (ref 7–232)

## 2019-04-12 ENCOUNTER — Telehealth: Payer: Self-pay | Admitting: Family Medicine

## 2019-04-12 ENCOUNTER — Telehealth: Payer: Self-pay

## 2019-04-12 ENCOUNTER — Telehealth: Payer: Self-pay | Admitting: Neurology

## 2019-04-12 NOTE — Telephone Encounter (Signed)
Pt wife would like to speak to someone about her husband going to [place] to get the blood work done

## 2019-04-12 NOTE — Telephone Encounter (Signed)
CRM # [phone] Owner: None Status: Resolved Priority: Routine Created on: 04/12/2019 05:58 PM By: Jasper Loser, [place], Stanley Boyd (Patient) Stanley Boyd (Patient) Quick Communication - Lab Results (Clinic Use ONLY)  Called patient to inform them of 04/10/19 lab results. When patient returns call, triage nurse may disclose results.   04/12/2019 Lumin Lander:  Patient's wife returned call for lab results, NO [facility] RN available. Please call back to give results.

## 2019-04-12 NOTE — Telephone Encounter (Signed)
Lab results were scanned to patients son.  Spoke with patients wife and results have been received.  Will send results to be scanned and keep a copy in the office.  Patients wife also reported that the Gastroenterologist results were all benign at this time.

## 2019-04-16 ENCOUNTER — Other Ambulatory Visit: Payer: Self-pay

## 2019-04-16 ENCOUNTER — Ambulatory Visit (HOSPITAL_COMMUNITY): Payer: Medicare Other

## 2019-04-16 ENCOUNTER — Encounter: Payer: Self-pay | Admitting: Family Medicine

## 2019-04-16 ENCOUNTER — Encounter (HOSPITAL_COMMUNITY): Payer: Self-pay

## 2019-04-16 ENCOUNTER — Ambulatory Visit (HOSPITAL_COMMUNITY)
Admission: RE | Admit: 2019-04-16 | Discharge: 2019-04-16 | Disposition: A | Discharge status: Home / Self Care | Payer: Medicare Other | Source: Ambulatory Visit | Attending: Neurology | Admitting: Neurology

## 2019-04-16 DIAGNOSIS — G7249 Other inflammatory and immune myopathies, not elsewhere classified: Secondary | ICD-10-CM

## 2019-04-16 DIAGNOSIS — E876 Hypokalemia: Secondary | ICD-10-CM

## 2019-04-16 HISTORY — DX: Myoneural disorder, unspecified: G70.9

## 2019-04-16 MED ORDER — DEXTROSE 5 % IV SOLN
INTRAVENOUS | Status: DC
  Administered 2019-04-16: 08:00:00 via INTRAVENOUS

## 2019-04-16 MED ORDER — MYCOPHENOLATE MOFETIL 500 MG PO TABS: 500.0000 mg | ORAL_TABLET | Freq: Two times a day (BID) | ORAL | 3 refills | Status: DC

## 2019-04-16 MED ORDER — IMMUNE GLOBULIN (HUMAN) 5 GM/50ML IV SOLN
1.0000 g/kg | INTRAVENOUS | Status: DC
  Administered 2019-04-16: 75 g via INTRAVENOUS
  Filled 2019-04-16 (×2): qty 50

## 2019-04-16 MED ORDER — DIPHENHYDRAMINE HCL 25 MG PO CAPS
25.0000 mg | ORAL_CAPSULE | ORAL | Status: DC
  Administered 2019-04-16: 25 mg via ORAL
  Filled 2019-04-16: qty 1

## 2019-04-16 MED ORDER — ACETAMINOPHEN 325 MG PO TABS
325.0000 mg | ORAL_TABLET | ORAL | Status: DC
  Administered 2019-04-16: 325 mg via ORAL
  Filled 2019-04-16: qty 1

## 2019-04-16 NOTE — Telephone Encounter (Signed)
Responded via [employer] and phone call

## 2019-04-16 NOTE — Discharge Instructions (Signed)
Immune Globulin Injection What is this medicine? IMMUNE GLOBULIN (im MUNE GLOB yoo lin) helps to prevent or reduce the severity of certain infections in patients who are at risk. This medicine is collected from the pooled blood of many donors. It is used to treat immune system problems, thrombocytopenia, and Kawasaki syndrome. This medicine may be used for other purposes; ask your health care provider or pharmacist if you have questions. COMMON BRAND NAME(S): ASCENIV, Baygam, BIVIGAM, Carimune, Carimune NF, cutaquig, Cuvitru, Flebogamma, Flebogamma DIF, GamaSTAN, GamaSTAN S/D, Gamimune N, Gammagard, Gammagard S/D, Gammaked, Gammaplex, Gammar-P IV, Gamunex, Gamunex-C, Hizentra, Iveegam, Iveegam EN, Octagam, Panglobulin, Panglobulin NF, panzyga, Polygam S/D, Privigen, Sandoglobulin, Venoglobulin-S, Vigam, Vivaglobulin, Xembify What should I tell my health care provider before I take this medicine? They need to know if you have any of these conditions:   diabetes   extremely low or no immune antibodies in the blood   heart disease   history of blood clots   hyperprolinemia   infection in the blood, sepsis   kidney disease   taking medicine that may change kidney function - ask your health care provider about your medicine   an unusual or allergic reaction to human immune globulin, albumin, maltose, sucrose, polysorbate 80, other medicines, foods, dyes, or preservatives   pregnant or trying to get pregnant   breast-feeding How should I use this medicine? This medicine is for injection into a muscle or infusion into a vein or skin. It is usually given by a health care professional in a hospital or clinic setting. In rare cases, some brands of this medicine might be given at home. You will be taught how to give this medicine. Use exactly as directed. Take your medicine at regular intervals. Do not take your medicine more often than directed. Talk to your pediatrician regarding  the use of this medicine in children. Special care may be needed. Overdosage: If you think you have taken too much of this medicine contact a poison control center or emergency room at once. NOTE: This medicine is only for you. Do not share this medicine with others. What if I miss a dose? It is important not to miss your dose. Call your doctor or health care professional if you are unable to keep an appointment. If you give yourself the medicine and you miss a dose, take it as soon as you can. If it is almost time for your next dose, take only that dose. Do not take double or extra doses. What may interact with this medicine?  aspirin and aspirin-like medicines  cisplatin  cyclosporine  medicines for infection like acyclovir, adefovir, amphotericin B, bacitracin, cidofovir, foscarnet, ganciclovir, gentamicin, pentamidine, vancomycin  NSAIDS, medicines for pain and inflammation, like ibuprofen or naproxen  pamidronate  vaccines  zoledronic acid This list may not describe all possible interactions. Give your health care provider a list of all the medicines, herbs, non-prescription drugs, or dietary supplements you use. Also tell them if you smoke, drink alcohol, or use illegal drugs. Some items may interact with your medicine. What should I watch for while using this medicine? Your condition will be monitored carefully while you are receiving this medicine. This medicine is made from pooled blood donations of many different people. It may be possible to pass an infection in this medicine. However, the donors are screened for infections and all products are tested for HIV and hepatitis. The medicine is treated to kill most or all bacteria and viruses. Talk to your doctor about   the risks and benefits of this medicine. Do not have vaccinations for at least 14 days before, or until at least 3 months after receiving this medicine. What side effects may I notice from receiving this  medicine? Side effects that you should report to your doctor or health care professional as soon as possible:  allergic reactions like skin rash, itching or hives, swelling of the face, lips, or tongue  breathing problems  chest pain or tightness  fever, chills  headache with nausea, vomiting  neck pain or difficulty moving neck  pain when moving eyes  pain, swelling, warmth in the leg  problems with balance, talking, walking  sudden weight gain  swelling of the ankles, feet, hands  trouble passing urine or change in the amount of urine Side effects that usually do not require medical attention (report to your doctor or health care professional if they continue or are bothersome):  dizzy, drowsy  flushing  increased sweating  leg cramps  muscle aches and pains  pain at site where injected This list may not describe all possible side effects. Call your doctor for medical advice about side effects. You may report side effects to FDA at 1-800-FDA-1088. Where should I keep my medicine? Keep out of the reach of children. This drug is usually given in a hospital or clinic and will not be stored at home. In rare cases, some brands of this medicine may be given at home. If you are using this medicine at home, you will be instructed on how to store this medicine. Throw away any unused medicine after the expiration date on the label. NOTE: This sheet is a summary. It may not cover all possible information. If you have questions about this medicine, talk to your doctor, pharmacist, or health care provider.  2020 Elsevier/Gold Standard (2008-12-18 11:44:49)  

## 2019-04-16 NOTE — Telephone Encounter (Signed)
Start Cellcept 500mg  BID, check CBC and CMP weekly x 4 weeks, then monthly x 3, then q3 months.

## 2019-04-16 NOTE — Telephone Encounter (Signed)
Called pt and schedule lab visit for tomorrow. Lab order placed.

## 2019-04-17 ENCOUNTER — Other Ambulatory Visit (INDEPENDENT_AMBULATORY_CARE_PROVIDER_SITE_OTHER): Payer: Medicare Other

## 2019-04-17 ENCOUNTER — Ambulatory Visit (HOSPITAL_COMMUNITY)
Admission: RE | Admit: 2019-04-17 | Discharge: 2019-04-17 | Disposition: A | Discharge status: Home / Self Care | Payer: Medicare Other | Source: Ambulatory Visit | Attending: Neurology | Admitting: Neurology

## 2019-04-17 ENCOUNTER — Ambulatory Visit (HOSPITAL_COMMUNITY): Payer: Medicare Other

## 2019-04-17 DIAGNOSIS — G7249 Other inflammatory and immune myopathies, not elsewhere classified: Secondary | ICD-10-CM | POA: Diagnosis present

## 2019-04-17 DIAGNOSIS — E876 Hypokalemia: Secondary | ICD-10-CM | POA: Diagnosis not present

## 2019-04-17 MED ORDER — DEXTROSE 5 % IV SOLN
INTRAVENOUS | Status: DC
  Administered 2019-04-17: 10:00:00 via INTRAVENOUS

## 2019-04-17 MED ORDER — DIPHENHYDRAMINE HCL 25 MG PO CAPS
25.0000 mg | ORAL_CAPSULE | ORAL | Status: AC
  Administered 2019-04-17: 25 mg via ORAL
  Filled 2019-04-17: qty 1

## 2019-04-17 MED ORDER — ACETAMINOPHEN 325 MG PO TABS
325.0000 mg | ORAL_TABLET | ORAL | Status: AC
  Administered 2019-04-17: 325 mg via ORAL
  Filled 2019-04-17: qty 1

## 2019-04-17 MED ORDER — IMMUNE GLOBULIN (HUMAN) 5 GM/50ML IV SOLN
1.0000 g/kg | INTRAVENOUS | Status: AC
  Administered 2019-04-17: 75 g via INTRAVENOUS
  Filled 2019-04-17: qty 50

## 2019-04-18 LAB — BASIC METABOLIC PANEL
BUN: 20 mg/dL (ref 6–23)
CO2: 29 mEq/L (ref 19–32)
Calcium: 9.4 mg/dL (ref 8.4–10.5)
Chloride: 95 mEq/L — ABNORMAL LOW (ref 96–112)
Creatinine, Ser: 0.56 mg/dL (ref 0.40–1.50)
GFR: 145.97 mL/min (ref >60.00)
Glucose, Bld: 129 mg/dL — ABNORMAL HIGH (ref 70–99)
Potassium: 4.7 mEq/L (ref 3.5–5.1)
Sodium: 131 mEq/L — ABNORMAL LOW (ref 135–145)

## 2019-04-25 ENCOUNTER — Other Ambulatory Visit (HOSPITAL_COMMUNITY): Payer: Self-pay

## 2019-04-25 ENCOUNTER — Other Ambulatory Visit: Payer: Self-pay

## 2019-04-25 DIAGNOSIS — G7249 Other inflammatory and immune myopathies, not elsewhere classified: Secondary | ICD-10-CM

## 2019-04-25 DIAGNOSIS — R1310 Dysphagia, unspecified: Secondary | ICD-10-CM

## 2019-04-27 ENCOUNTER — Other Ambulatory Visit: Payer: Self-pay | Admitting: Neurology

## 2019-04-28 LAB — COMPREHENSIVE METABOLIC PANEL
ALT: 530 IU/L (ref 0–44)
AST: 319 IU/L — ABNORMAL HIGH (ref 0–40)
Albumin/Globulin Ratio: 1.1 — ABNORMAL LOW (ref 1.2–2.2)
Albumin: 4 g/dL (ref 3.8–4.8)
Alkaline Phosphatase: 67 IU/L (ref 39–117)
BUN/Creatinine Ratio: 28 — ABNORMAL HIGH (ref 10–24)
BUN: 16 mg/dL (ref 8–27)
Bilirubin Total: 0.3 mg/dL (ref 0.0–1.2)
CO2: 23 mmol/L (ref 20–29)
Calcium: 10 mg/dL (ref 8.6–10.2)
Chloride: 91 mmol/L — ABNORMAL LOW (ref 96–106)
Creatinine, Ser: 0.57 mg/dL — ABNORMAL LOW (ref 0.76–1.27)
GFR calc Af Amer: 124 mL/min/{1.73_m2} (ref >59)
GFR calc non Af Amer: 107 mL/min/{1.73_m2} (ref >59)
Globulin, Total: 3.5 g/dL (ref 1.5–4.5)
Glucose: 152 mg/dL — ABNORMAL HIGH (ref 65–99)
Potassium: 4.8 mmol/L (ref 3.5–5.2)
Sodium: 137 mmol/L (ref 134–144)
Total Protein: 7.5 g/dL (ref 6.0–8.5)

## 2019-04-28 LAB — CBC
Hematocrit: 46 % (ref 37.5–51.0)
Hemoglobin: 15.4 g/dL (ref 13.0–17.7)
MCH: 30.1 pg (ref 26.6–33.0)
MCHC: 33.5 g/dL (ref 31.5–35.7)
MCV: 90 fL (ref 79–97)
Platelets: 237 10*3/uL (ref 150–450)
RBC: 5.11 x10E6/uL (ref 4.14–5.80)
RDW: 14.5 % (ref 11.6–15.4)
WBC: 10.2 10*3/uL (ref 3.4–10.8)

## 2019-05-04 ENCOUNTER — Other Ambulatory Visit: Payer: Self-pay | Admitting: Neurology

## 2019-05-05 LAB — COMPREHENSIVE METABOLIC PANEL
ALT: 434 IU/L — ABNORMAL HIGH (ref 0–44)
AST: 274 IU/L — ABNORMAL HIGH (ref 0–40)
Albumin/Globulin Ratio: 1.4 (ref 1.2–2.2)
Albumin: 4.1 g/dL (ref 3.8–4.8)
Alkaline Phosphatase: 60 IU/L (ref 39–117)
BUN/Creatinine Ratio: 24 (ref 10–24)
BUN: 14 mg/dL (ref 8–27)
Bilirubin Total: 0.2 mg/dL (ref 0.0–1.2)
CO2: 24 mmol/L (ref 20–29)
Calcium: 9.9 mg/dL (ref 8.6–10.2)
Chloride: 94 mmol/L — ABNORMAL LOW (ref 96–106)
Creatinine, Ser: 0.58 mg/dL — ABNORMAL LOW (ref 0.76–1.27)
GFR calc Af Amer: 123 mL/min/{1.73_m2} (ref >59)
GFR calc non Af Amer: 106 mL/min/{1.73_m2} (ref >59)
Globulin, Total: 3 g/dL (ref 1.5–4.5)
Glucose: 141 mg/dL — ABNORMAL HIGH (ref 65–99)
Potassium: 5.3 mmol/L — ABNORMAL HIGH (ref 3.5–5.2)
Sodium: 136 mmol/L (ref 134–144)
Total Protein: 7.1 g/dL (ref 6.0–8.5)

## 2019-05-05 LAB — SPECIMEN STATUS REPORT

## 2019-05-09 ENCOUNTER — Ambulatory Visit (HOSPITAL_COMMUNITY)
Admission: RE | Admit: 2019-05-09 | Discharge: 2019-05-09 | Disposition: A | Discharge status: Home / Self Care | Payer: Medicare Other | Source: Ambulatory Visit | Attending: Neurology | Admitting: Neurology

## 2019-05-09 ENCOUNTER — Other Ambulatory Visit: Payer: Self-pay

## 2019-05-09 DIAGNOSIS — R1310 Dysphagia, unspecified: Secondary | ICD-10-CM | POA: Insufficient documentation

## 2019-05-09 DIAGNOSIS — G7249 Other inflammatory and immune myopathies, not elsewhere classified: Secondary | ICD-10-CM

## 2019-05-09 NOTE — Progress Notes (Signed)
Modified Barium Swallow Progress Note  Patient Details  Name: Stanley Boyd MRN: [id number] Date of Birth: May 03, 1951  Today's Date: 05/09/2019  Modified Barium Swallow completed.  Full report located under Chart Review in the Imaging Section.  Brief recommendations include the following:  Clinical Impression  Pt has a mild pharyngeal dysphagia with suspected esophageal dysphagia as well.  He has appropriate oral preparation and clearance, although he does use a posterior head tilt to propel a barium tablet. He has reduced anterior hyoid excursion, which combined with presence of cervical hardware, appears to mildly reduce pharyngeal clearance into the UES. Trace to mild residue is present, primarily at the valleculae, although also at the pyriform sinuses with thin liquids. Penetration in trace quantity occurred at least once on this thin liquid residue. Penetration also occurred during the swallow with thin liquids due to impaired timing for airway closure. Although penetrates did occasionaly reach the true vocal folds, it was always in trace amounts and he consistently elicited a reflexive throat clear or cough to clear his airway. Use of a chin tuck helped to reduce entrance into the airway, keeping the volume low but also resulting in more shallow penetration that cleared even more readily with a subsequent swallow. Although this is a likely source for pt's presenting symptoms, he did also have a column of barium in his esophagus upon scan (not confirmed by MD) and reported a globus sensation, which could also be contributing. Given that he has not had any concern for PNA or respiratory issues, recommend continuing regular solids and thin liquids with use of chin tuck as well as aspiration and esophageal precautions. Pt could consider f/u SLP therapy to maximize swallow function and cough.   Swallow Evaluation Recommendations       SLP Diet Recommendations: Regular solids;Thin liquid   Liquid  Administration via: Cup;Straw   Medication Administration: Whole meds with liquid   Supervision: Patient able to self feed   Compensations: Slow rate;Small sips/bites;Follow solids with liquid;Clear throat intermittently;Chin tuck   Postural Changes: Remain semi-upright after after feeds/meals (Comment);Seated upright at 90 degrees   Oral Care Recommendations: Oral care BID        Venita Sheffield Timera Windt 05/09/2019,12:58 PM   Pollyann Glen, M.A. [employer] education officer [phone] Office [phone]

## 2019-05-10 ENCOUNTER — Encounter: Payer: Self-pay | Admitting: Neurology

## 2019-05-10 ENCOUNTER — Ambulatory Visit (INDEPENDENT_AMBULATORY_CARE_PROVIDER_SITE_OTHER): Payer: Medicare Other | Admitting: Neurology

## 2019-05-10 ENCOUNTER — Other Ambulatory Visit (INDEPENDENT_AMBULATORY_CARE_PROVIDER_SITE_OTHER): Payer: Medicare Other

## 2019-05-10 VITALS — BP 130/84 | HR 64 | Ht 69.0 in | Wt 181.0 lb

## 2019-05-10 DIAGNOSIS — G7249 Other inflammatory and immune myopathies, not elsewhere classified: Secondary | ICD-10-CM

## 2019-05-10 NOTE — Progress Notes (Signed)
Follow-up Visit   Date: 05/10/19   Stanley Boyd MRN: [id number] DOB: 31-Jul-1951   Interim History: Stanley Boyd is a 68 y.o. 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  The patient was accompanied to the clinic by wife who also provides collateral information.    History of present illness: Early April 2020, he began having difficulty walking, especially raising his legs, such as when getting into a car.  This gradually progressed over the next 4 weeks to the point where he was unable to raise his leg to climb onto a step. Around the same time, he developed a skin rash of the chest and forearm.  He was evaluated by his PCP who noted 4-/5 muscle strength in the legs and [place] [address].  He was hospitalized at [facility] from 5/8 - 02/20/2019 for evaluation and hydration.  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  MRI left femur was most notable with diffuse, symmetric muscle edema and enhancement in the proximal hip girdle muscles. CK peaked at [address]. Left rectus femoris muscle biopsy and skin biopsy was performed.  He was emperically started on prednisone 61m due to high clinical suspicion for inflammatory myopathy, despite normal inflammatory/autoimmune markers (see below).  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  Skin biopsy showed benign lesion with fibrosis and mixed superficial and deep inflammation.  He has a history of CAD, followed by Dr. GEinar Gip  He was previously on statin therapy for ~ 2 years, but due to elevated liver enzymes, he was switched Praluent in 2019.  His LFTs have been elevated in the past,  [facility], [facility] 61*, ALT 90*, alk phos 126*.  He denies history of myalgias or cramps.    UPDATE 03/20/2019:  He is here for 2 week follow-up with his son, Dr. JThersa Boyd  He remains on prednisone 888mdaily and CK last week was elevated at 7703.  He continues to have weakness of the arms and legs, no significant improvement despite being on prednisone 8073mor almost a month. Weakness involves his upper arms and legs.  For example, applying deodorant is challenging because of inability to extend the arm and climb steps/stairs.  He is having greater difficulty walking, but is still walking unassisted. Fortunately, he has not suffered falls since his last visit.    UPDATE 04/10/2019:  He is here for 3 week follow-up visit.  He denies any significant change in degree of his weakness in the arms or legs since his last visit.  Specifically, he does not feel any symptoms have progressed.  His legs continue to feel very heavy and he suffered one fall and he was unable to stand up himself.  He is able to walk unassisted.  There has been delay in trying to get his IVIG approved due to his Medicare part B declining home infusion.  He is now scheduled to have this administered at the hospital next week.  He denies neck heaviness, dysphagia, or dysarthria.  [facility] HMGCR antibodies (see below).    UPDATE 05/10/2019:  He is here for follow-up  visit.  He received initiation dose of IVIG in early July and besides a headache, tolerated this well.  He also started CellCept 500 mg twice daily and tapered his prednisone down to 60 mg daily, which he has been on for the past 2 weeks.  He denies any worsening arm or leg weakness.  He feels that his arm strength is slowly improving, because it is easier for him to apply deodorant and walking endurance is better.  He tells me that he walked two  football fields today, unassisted.    He did see Dr. Tillman Abide at [facility] for second opinion, who agreed with my management plan.  As per their recommendation, swallow evaluation was ordered and shows mild aspiration risk.  He was recommended to use chin tuck position when swallowing thin liquids.   Medications:  Current Outpatient Medications on File Prior to Visit  Medication Sig Dispense Refill  . Alirocumab (PRALUENT) 150 MG/ML SOAJ Inject in to skin every 2 weeks Currently on hold    . aspirin 81 MG chewable tablet Chew 1 tablet (81 mg total) by mouth at bedtime.    . calcium-vitamin D (OSCAL WITH D) 500-200 MG-UNIT tablet Take 1 tablet by mouth 3 (three) times daily. 270 tablet 3  . cyanocobalamin (,VITAMIN B-12,) 1000 MCG/ML injection Inject IM daily for 7 days, then weekly for 4 weeks, then monthly 10 mL 0  . finasteride (PROSCAR) 5 MG tablet Take 5 mg by mouth at bedtime.     . finasteride (PROSCAR) 5 MG tablet     . fluticasone (FLONASE) 50 MCG/ACT nasal spray Place 2 sprays into both nostrils daily as needed for allergies or rhinitis.    . metFORMIN (GLUCOPHAGE) 500 MG tablet Take 1 tablet (500 mg total) by mouth 2 (two) times daily with a meal. 180 tablet 3  . mycophenolate (CELLCEPT) 500 MG tablet Take 1 tablet (500 mg total) by mouth 2 (two) times daily. 60 tablet 3  . nitroGLYCERIN (NITROSTAT) 0.4 MG SL tablet Place 1 tablet (0.4 mg total) under the tongue every 5 (five) minutes as needed for chest pain. 25 tablet 4  . pantoprazole (PROTONIX) 40 MG tablet Take 1 tablet (40 mg total) by mouth daily. 90 tablet 3  . Polyvinyl Alcohol-Povidone (REFRESH OP) Place 1 drop into both eyes daily.    Marland Kitchen PRALUENT 150 MG/ML SOAJ Inject in to skin every 2 weeks Currently on hold    . predniSONE (DELTASONE) 20 MG tablet Reduce to '70mg'$  (3.5 tab) for 2 weeks, then '60mg'$  daily. 120 tablet 3  . sulfamethoxazole-trimethoprim (BACTRIM DS) 800-160 MG tablet Take 1 tablet by  mouth 3 (three) times a week. 40 tablet 3  . Syringe/Needle, Disp, (SYRINGE 3CC/22GX1") 22G X 1" 3 ML MISC Use with B12 injections 50 each 1  . tamsulosin (FLOMAX) 0.4 MG CAPS capsule Take 0.4 mg by mouth at bedtime.     No current facility-administered medications on file prior to visit.     Allergies:  Allergies  Allergen Reactions  . Statins Other (See Comments)    Myositis Myositis    Review of Systems:  CONSTITUTIONAL: No fevers, chills, night sweats, or weight loss.  EYES: No visual changes or eye pain ENT: No hearing changes.  No history of nose bleeds.   RESPIRATORY: No cough, wheezing and shortness of breath.   CARDIOVASCULAR: Negative for chest pain, and palpitations.   GI: Negative for abdominal discomfort, blood in stools or black stools.  No recent  change in bowel habits.   GU:  No history of incontinence.   MUSCLOSKELETAL: No history of joint pain or swelling.  No myalgias.   SKIN: Negative for lesions, rash, and itching.   ENDOCRINE: Negative for cold or heat intolerance, polydipsia or goiter.   PSYCH:  No depression or anxiety symptoms.   NEURO: As Above.   Vital Signs:  BP 130/84   Pulse 64   Ht '5\' 9"'$  (1.753 m)   Wt 181 lb (82.1 kg)   SpO2 98%   BMI 26.73 kg/m   General Medical Exam:   General:  Well appearing, comfortable  Eyes/ENT: see cranial nerve examination.   Neck:   No carotid bruits. Respiratory:  Clear to auscultation, good air entry bilaterally.   Cardiac:  Regular rate and rhythm, no murmur.   Ext:  No edema  Neurological Exam: MENTAL STATUS including orientation to time, place, person, recent and remote memory, attention span and concentration, language, and fund of knowledge is normal.  Speech is sounds stronger, not as hypophonic as he previously was.  CRANIAL NERVES:   extra-ocular eye movements in all directions of gaze.  No ptosis.  Face is symmetric. Facial muscles are 5/5 bilaterally.  Palate elevates symmetrically.  Tongue is  midline and strength is 5/5.  MOTOR:  Mild proximal leg atrophy, No fasciculations or abnormal movements.  No pronator drift.  Neck flexion and extension is 5/5.   Upper Extremity:  Right  Left  Deltoid * 5/5   5/5   Biceps  5/5   5/5   Triceps * 5/5   5/5   Infraspinatus * 5-/5  5-/5  Medial pectoralis * 5-/5  5-/5  Wrist extensors  5-/5   5/5   Wrist flexors  5-/5   5/5   Finger extensors  4/5   5/5   Finger flexors  4/5   5/5   Dorsal interossei  4/5   5/5   Abductor pollicis  4/5   5/5   Tone (Ashworth scale)  0  0   Lower Extremity:  Right  Left  Hip flexors  2+/5   2+/5   Hip extensors  4/5   4/5   Adductor 3+/5  3+/5  Abductor * 4+/5  4+/5  Knee flexors * 5-/5   5-/5   Knee extensors * 5-/5   5-/5   Dorsiflexors  5/5   5/5   Plantarflexors  5/5   5/5   Toe extensors  5/5   5/5   Toe flexors  5/5   5/5   Tone (Ashworth scale)  0  0   MSRs:  Reflexes are 3+/4 throughout except 2+/4 bilateral Achilles  COORDINATION/GAIT:  He is able to stand up from chair without using arms to push off.  Waddling gait, improved.  Appears steady.  Data: MRI cervical spine without contrast 02/17/2019: 1. 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 2. Solid anterior fusion at C4-5 and C5-6 without spinal stenosis at these levels. 3. Adjacent segment degenerative changes with moderate spinal stenosis at C6-7, mild-to-moderate spinal stenosis at C3-4, and mild-to-moderate neural foraminal stenosis at both levels.  MRI lumbar spine 02/17/2019: 1. Relatively diffuse lumbar disc and facet degeneration without significant spinal stenosis. 2. Multilevel neural foraminal stenosis, moderate on the left at L5-S1. 3. Normal appearance of the conus medullaris and cauda equina.  MRI thoracic spine without contrast 02/17/2019: 1. Mild thoracic disc and facet degeneration without evidence of neural impingement. 2. Minimal prominence of the central canal  of the mid to lower thoracic spinal cord without syrinx.  MRI  left femur with and without contrast 02/18/2019: 1. Abnormal diffuse muscular edema and enhancement involving various muscles of the lower pelvis and thigh as detailed above. The coronal images indicate that this is a grossly symmetric process. I note that there is a plan for quadriceps muscle biopsy; I would recommend targeting the rectus femoris muscle and referring to today's MRI images given that the other quadriceps muscles demonstrate only minimal involvement. 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  CT chest w contrast 02/17/2019: 1. No CT evidence of malignancy in the chest, abdomen, or pelvis. 2. Innumerable tiny centrilobular pulmonary nodules in an apically predominant distribution, which may reflect sequelae of atypical infection or smoking-related respiratory bronchiolitis. There is an unchanged, benign somewhat bandlike consolidation in the right lung base. 3. Coronary artery disease. 4. Atherosclerosis. 5. Prostatomegaly.  Labs 02/17/2019:  Aldolase 122, anti-Jo neg, ANA neg, anti-Scl 70 neg, CRP 1.1, ESR 12, Ehrlichia antibody neg, Lyme neg, acute hepatitis panel neg, HIV neg, VGCC neg, paraneoplastic antibody panel neg 02/15/2019:  vitamin B12 162*, TSH 4.86*, free T4 0.78  Labs 08/2014 at [facility]:  CK 219, AST 61*, ALT 90*, alk phos 126*  Component      CK Total  Latest Ref Rng & Units      44 - 196 U/L  02/15/2019      14,781 (H)  02/16/2019     2:00 PM 16,516 (H)  02/16/2019     11:21 PM 17,630 (H)  02/17/2019      16,916 (H)  02/18/2019     12:42 AM 16,393 (H)  02/18/2019     12:15 PM 15,260 (H)  02/19/2019     12:12 AM 12,702 (H)  02/19/2019      7:01 AM 12,575 (H)  02/20/2019      11,953 (H)  02/26/2019      6,626 (H)  03/20/2019      7,372 (H)   Lab Results  Component Value Date   CKTOTAL 5,953 (H) 04/10/2019   CKMB 10 (H) 03/07/2019   TROPONINI 0.03 ([place]) 02/17/2019    Left rectus femorus muscle biopsy 02/20/2019:  Active necrotizing inflammatory myopathy. 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  He has been off statin therapy for ~1 year.  With his unexplained transaminitis over the past few years, it is likely that he had low grade muscle injury ongoing before he presented with fulminant rhabdomyolysis in May 2020.  He was started on IVIG and Cellcept in July 2020 and started prednisone taper.  He is on prednisone 82m daily currently.    Clinically, he has made dramatic improvement in strength in the upper extremities, but continues to have 2+/5 weakness with hip flexors bilaterally.  Knee extension and flexion has markedly improved.  Overall, I am very impressed at his improvement.  I will continue to slowly taper his prednisone while monitoring him closely for any relapse.   PLAN/RECOMMENDATIONS:  1.  Continue  IVIG 16m/kg every 21 days 2.  Continue Cellcept 5071mBID.  Monitor labs for weekly x 1, then monthly with CK. 3.  Reduce prednisone to 50101maily x 1 month.  Continue calcium, bactrim, and protonix 4.  Check CK, CMP, and CBC today. 5.  Continue home PT.  Will send to out-patient PT once he completed home therapy 6.  Continue monthly B12 injections  Return to clinic in 6 weeks  Thank you for allowing me to participate in patient's care.  If I can answer any additional questions, I would be pleased to do so.    Sincerely,     Donika K. PatPosey ProntoO

## 2019-05-10 NOTE — Patient Instructions (Addendum)
Check labs today, next week, and first week of September  Reduce prednisone to 50mg  daily  No changes to the other medications  When you are ready, call my office for a referral for out-patient physical therapy  Continue monthly B12 injection  Return to clinic in 9/17 at 3:20p

## 2019-05-11 LAB — COMPLETE METABOLIC PANEL WITH GFR
AG Ratio: 1.4 (calc) (ref 1.0–2.5)
ALT: 299 U/L — ABNORMAL HIGH (ref 9–46)
AST: 170 U/L — ABNORMAL HIGH (ref 10–35)
Albumin: 3.9 g/dL (ref 3.6–5.1)
Alkaline phosphatase (APISO): 43 U/L (ref 35–144)
BUN/Creatinine Ratio: 28 (calc) — ABNORMAL HIGH (ref 6–22)
BUN: 15 mg/dL (ref 7–25)
CO2: 31 mmol/L (ref 20–32)
Calcium: 9.9 mg/dL (ref 8.6–10.3)
Chloride: 97 mmol/L — ABNORMAL LOW (ref 98–110)
Creat: 0.54 mg/dL — ABNORMAL LOW (ref 0.70–1.25)
GFR, Est African American: 127 mL/min/{1.73_m2} (ref >=60)
GFR, Est Non African American: 109 mL/min/{1.73_m2} (ref >=60)
Globulin: 2.8 g/dL (calc) (ref 1.9–3.7)
Glucose, Bld: 129 mg/dL — ABNORMAL HIGH (ref 65–99)
Potassium: 5.2 mmol/L (ref 3.5–5.3)
Sodium: 136 mmol/L (ref 135–146)
Total Bilirubin: 0.4 mg/dL (ref 0.2–1.2)
Total Protein: 6.7 g/dL (ref 6.1–8.1)

## 2019-05-11 LAB — CBC WITH DIFFERENTIAL/PLATELET
Absolute Monocytes: 209 cells/uL (ref 200–950)
Basophils Absolute: 27 cells/uL (ref 0–200)
Basophils Relative: 0.3 %
Eosinophils Absolute: 0 cells/uL — ABNORMAL LOW (ref 15–500)
Eosinophils Relative: 0 %
HCT: 43.9 % (ref 38.5–50.0)
Hemoglobin: 14.5 g/dL (ref 13.2–17.1)
Lymphs Abs: 1028 cells/uL (ref 850–3900)
MCH: 30.1 pg (ref 27.0–33.0)
MCHC: 33 g/dL (ref 32.0–36.0)
MCV: 91.1 fL (ref 80.0–100.0)
MPV: 11.9 fL (ref 7.5–12.5)
Monocytes Relative: 2.3 %
Neutro Abs: 7835 cells/uL — ABNORMAL HIGH (ref 1500–7800)
Neutrophils Relative %: 86.1 %
Platelets: 231 10*3/uL (ref 140–400)
RBC: 4.82 10*6/uL (ref 4.20–5.80)
RDW: 14.7 % (ref 11.0–15.0)
Total Lymphocyte: 11.3 %
WBC: 9.1 10*3/uL (ref 3.8–10.8)

## 2019-05-11 LAB — CK: Total CK: 3494 U/L — ABNORMAL HIGH (ref 44–196)

## 2019-05-14 ENCOUNTER — Other Ambulatory Visit: Payer: Self-pay

## 2019-05-14 ENCOUNTER — Encounter (HOSPITAL_COMMUNITY): Payer: Self-pay

## 2019-05-14 ENCOUNTER — Encounter (HOSPITAL_COMMUNITY)
Admission: RE | Admit: 2019-05-14 | Discharge: 2019-05-14 | Disposition: A | Discharge status: Home / Self Care | Payer: Medicare Other | Source: Ambulatory Visit | Attending: Neurology | Admitting: Neurology

## 2019-05-14 DIAGNOSIS — D8989 Other specified disorders involving the immune mechanism, not elsewhere classified: Secondary | ICD-10-CM | POA: Diagnosis not present

## 2019-05-14 DIAGNOSIS — G7249 Other inflammatory and immune myopathies, not elsewhere classified: Secondary | ICD-10-CM | POA: Diagnosis present

## 2019-05-14 MED ORDER — DEXTROSE 5 % IV SOLN
INTRAVENOUS | Status: DC
  Administered 2019-05-14: 08:00:00 via INTRAVENOUS

## 2019-05-14 MED ORDER — DIPHENHYDRAMINE HCL 25 MG PO CAPS
25.0000 mg | ORAL_CAPSULE | ORAL | Status: AC
  Administered 2019-05-14: 08:00:00 25 mg via ORAL
  Filled 2019-05-14: qty 1

## 2019-05-14 MED ORDER — ACETAMINOPHEN 325 MG PO TABS
325.0000 mg | ORAL_TABLET | ORAL | Status: AC
  Administered 2019-05-14: 08:00:00 325 mg via ORAL
  Filled 2019-05-14: qty 1

## 2019-05-14 MED ORDER — IMMUNE GLOBULIN (HUMAN) 5 GM/50ML IV SOLN
1.0000 g/kg | INTRAVENOUS | Status: AC
  Administered 2019-05-14: 75 g via INTRAVENOUS
  Filled 2019-05-14: qty 50

## 2019-05-15 ENCOUNTER — Encounter (HOSPITAL_COMMUNITY)
Admission: RE | Admit: 2019-05-15 | Discharge: 2019-05-15 | Disposition: A | Discharge status: Home / Self Care | Payer: Medicare Other | Source: Ambulatory Visit | Attending: Neurology | Admitting: Neurology

## 2019-05-15 ENCOUNTER — Encounter (HOSPITAL_COMMUNITY): Payer: Self-pay

## 2019-05-15 DIAGNOSIS — G7249 Other inflammatory and immune myopathies, not elsewhere classified: Secondary | ICD-10-CM | POA: Diagnosis not present

## 2019-05-15 MED ORDER — DIPHENHYDRAMINE HCL 25 MG PO CAPS
25.0000 mg | ORAL_CAPSULE | ORAL | Status: AC
  Administered 2019-05-15: 25 mg via ORAL
  Filled 2019-05-15: qty 1

## 2019-05-15 MED ORDER — DEXTROSE 5 % IV SOLN
INTRAVENOUS | Status: DC
  Administered 2019-05-15: 08:00:00 via INTRAVENOUS

## 2019-05-15 MED ORDER — ACETAMINOPHEN 325 MG PO TABS
325.0000 mg | ORAL_TABLET | ORAL | Status: AC
  Administered 2019-05-15: 325 mg via ORAL
  Filled 2019-05-15: qty 1

## 2019-05-15 MED ORDER — IMMUNE GLOBULIN (HUMAN) 5 GM/50ML IV SOLN
1.0000 g/kg | INTRAVENOUS | Status: DC
  Administered 2019-05-15: 75 g via INTRAVENOUS
  Filled 2019-05-15: qty 50

## 2019-05-17 ENCOUNTER — Other Ambulatory Visit: Payer: Self-pay | Admitting: Neurology

## 2019-05-18 LAB — CBC/DIFF AMBIGUOUS DEFAULT
Basophils Absolute: 0 10*3/uL (ref 0.0–0.2)
Basos: 0 %
EOS (ABSOLUTE): 0 10*3/uL (ref 0.0–0.4)
Eos: 0 %
Hematocrit: 43.1 % (ref 37.5–51.0)
Hemoglobin: 14.6 g/dL (ref 13.0–17.7)
Immature Grans (Abs): 0.1 10*3/uL (ref 0.0–0.1)
Immature Granulocytes: 2 %
Lymphocytes Absolute: 1 10*3/uL (ref 0.7–3.1)
Lymphs: 15 %
MCH: 30.2 pg (ref 26.6–33.0)
MCHC: 33.9 g/dL (ref 31.5–35.7)
MCV: 89 fL (ref 79–97)
Monocytes Absolute: 0.2 10*3/uL (ref 0.1–0.9)
Monocytes: 4 %
Neutrophils Absolute: 5.3 10*3/uL (ref 1.4–7.0)
Neutrophils: 79 %
Platelets: 162 10*3/uL (ref 150–450)
RBC: 4.83 x10E6/uL (ref 4.14–5.80)
RDW: 15.3 % (ref 11.6–15.4)
WBC: 6.7 10*3/uL (ref 3.4–10.8)

## 2019-05-18 LAB — COMPREHENSIVE METABOLIC PANEL
ALT: 229 IU/L — ABNORMAL HIGH (ref 0–44)
AST: 167 IU/L — ABNORMAL HIGH (ref 0–40)
Albumin/Globulin Ratio: 0.8 — ABNORMAL LOW (ref 1.2–2.2)
Albumin: 3.7 g/dL — ABNORMAL LOW (ref 3.8–4.8)
Alkaline Phosphatase: 59 IU/L (ref 39–117)
BUN/Creatinine Ratio: 20 (ref 10–24)
BUN: 14 mg/dL (ref 8–27)
Bilirubin Total: 0.3 mg/dL (ref 0.0–1.2)
CO2: 24 mmol/L (ref 20–29)
Calcium: 9.9 mg/dL (ref 8.6–10.2)
Chloride: 94 mmol/L — ABNORMAL LOW (ref 96–106)
Creatinine, Ser: 0.69 mg/dL — ABNORMAL LOW (ref 0.76–1.27)
GFR calc Af Amer: 114 mL/min/{1.73_m2} (ref >59)
GFR calc non Af Amer: 99 mL/min/{1.73_m2} (ref >59)
Globulin, Total: 4.9 g/dL — ABNORMAL HIGH (ref 1.5–4.5)
Glucose: 136 mg/dL — ABNORMAL HIGH (ref 65–99)
Potassium: 4.8 mmol/L (ref 3.5–5.2)
Sodium: 133 mmol/L — ABNORMAL LOW (ref 134–144)
Total Protein: 8.6 g/dL — ABNORMAL HIGH (ref 6.0–8.5)

## 2019-05-18 LAB — SPECIMEN STATUS REPORT

## 2019-05-23 NOTE — Telephone Encounter (Signed)
Called [facility] for copy of results [phone]  Request results to be fax to [phone]

## 2019-06-04 ENCOUNTER — Telehealth: Payer: Self-pay

## 2019-06-04 NOTE — Telephone Encounter (Signed)
-  Sent order for outpatient physical therapy.  --[id number]

## 2019-06-05 ENCOUNTER — Other Ambulatory Visit: Payer: Self-pay

## 2019-06-05 ENCOUNTER — Telehealth: Payer: Self-pay | Admitting: Neurology

## 2019-06-05 ENCOUNTER — Telehealth: Payer: Self-pay

## 2019-06-05 DIAGNOSIS — R531 Weakness: Secondary | ICD-10-CM

## 2019-06-05 NOTE — Telephone Encounter (Signed)
Patient wife called and states that [facility] has not received referral yet please refax it to [phone]

## 2019-06-05 NOTE — Telephone Encounter (Signed)
Sent records over and refax the referral to [place] [facility] .

## 2019-06-07 ENCOUNTER — Ambulatory Visit (HOSPITAL_COMMUNITY)
Admission: RE | Admit: 2019-06-07 | Discharge: 2019-06-07 | Disposition: A | Discharge status: Home / Self Care | Payer: Medicare Other | Source: Ambulatory Visit | Attending: Neurology | Admitting: Neurology

## 2019-06-07 ENCOUNTER — Encounter: Payer: Medicare Other | Admitting: Family Medicine

## 2019-06-07 ENCOUNTER — Encounter (HOSPITAL_COMMUNITY): Payer: Self-pay

## 2019-06-07 ENCOUNTER — Other Ambulatory Visit: Payer: Self-pay

## 2019-06-07 DIAGNOSIS — G7249 Other inflammatory and immune myopathies, not elsewhere classified: Secondary | ICD-10-CM | POA: Insufficient documentation

## 2019-06-07 MED ORDER — ACETAMINOPHEN 325 MG PO TABS
325.0000 mg | ORAL_TABLET | ORAL | Status: DC
  Administered 2019-06-07: 325 mg via ORAL
  Filled 2019-06-07: qty 1

## 2019-06-07 MED ORDER — DEXTROSE 5 % IV SOLN
INTRAVENOUS | Status: DC
  Administered 2019-06-07: 08:00:00 via INTRAVENOUS

## 2019-06-07 MED ORDER — DIPHENHYDRAMINE HCL 25 MG PO CAPS
25.0000 mg | ORAL_CAPSULE | ORAL | Status: DC
  Administered 2019-06-07: 25 mg via ORAL
  Filled 2019-06-07: qty 1

## 2019-06-07 MED ORDER — IMMUNE GLOBULIN (HUMAN) 5 GM/50ML IV SOLN
1.0000 g/kg | INTRAVENOUS | Status: DC
  Administered 2019-06-07: 75 g via INTRAVENOUS
  Filled 2019-06-07: qty 50

## 2019-06-08 ENCOUNTER — Ambulatory Visit (HOSPITAL_COMMUNITY)
Admission: RE | Admit: 2019-06-08 | Discharge: 2019-06-08 | Disposition: A | Discharge status: Home / Self Care | Payer: Medicare Other | Source: Ambulatory Visit | Attending: Neurology | Admitting: Neurology

## 2019-06-08 ENCOUNTER — Ambulatory Visit: Payer: Self-pay | Admitting: Cardiology

## 2019-06-08 DIAGNOSIS — G7249 Other inflammatory and immune myopathies, not elsewhere classified: Secondary | ICD-10-CM | POA: Diagnosis not present

## 2019-06-08 MED ORDER — DIPHENHYDRAMINE HCL 25 MG PO CAPS
25.0000 mg | ORAL_CAPSULE | ORAL | Status: DC
  Administered 2019-06-08: 25 mg via ORAL
  Filled 2019-06-08: qty 1

## 2019-06-08 MED ORDER — ACETAMINOPHEN 325 MG PO TABS
325.0000 mg | ORAL_TABLET | ORAL | Status: DC
  Administered 2019-06-08: 325 mg via ORAL
  Filled 2019-06-08: qty 1

## 2019-06-08 MED ORDER — DEXTROSE 5 % IV SOLN
INTRAVENOUS | Status: DC
  Administered 2019-06-08: 08:00:00 via INTRAVENOUS

## 2019-06-08 MED ORDER — IMMUNE GLOBULIN (HUMAN) 5 GM/50ML IV SOLN
1.0000 g/kg | INTRAVENOUS | Status: DC
  Administered 2019-06-08: 85 g via INTRAVENOUS
  Filled 2019-06-08: qty 50

## 2019-06-11 ENCOUNTER — Other Ambulatory Visit: Payer: Self-pay

## 2019-06-11 ENCOUNTER — Other Ambulatory Visit: Payer: Self-pay | Admitting: Neurology

## 2019-06-11 DIAGNOSIS — G7249 Other inflammatory and immune myopathies, not elsewhere classified: Secondary | ICD-10-CM

## 2019-06-11 DIAGNOSIS — E538 Deficiency of other specified B group vitamins: Secondary | ICD-10-CM

## 2019-06-11 DIAGNOSIS — G729 Myopathy, unspecified: Secondary | ICD-10-CM

## 2019-06-11 DIAGNOSIS — R531 Weakness: Secondary | ICD-10-CM

## 2019-06-12 LAB — CBC WITH DIFFERENTIAL/PLATELET
Basophils Absolute: 0 10*3/uL (ref 0.0–0.2)
Basos: 1 %
EOS (ABSOLUTE): 0 10*3/uL (ref 0.0–0.4)
Eos: 0 %
Hematocrit: 43 % (ref 37.5–51.0)
Hemoglobin: 13.9 g/dL (ref 13.0–17.7)
Immature Grans (Abs): 0.2 10*3/uL — ABNORMAL HIGH (ref 0.0–0.1)
Immature Granulocytes: 2 %
Lymphocytes Absolute: 1.3 10*3/uL (ref 0.7–3.1)
Lymphs: 15 %
MCH: 30 pg (ref 26.6–33.0)
MCHC: 32.3 g/dL (ref 31.5–35.7)
MCV: 93 fL (ref 79–97)
Monocytes Absolute: 0.2 10*3/uL (ref 0.1–0.9)
Monocytes: 3 %
Neutrophils Absolute: 6.8 10*3/uL (ref 1.4–7.0)
Neutrophils: 79 %
Platelets: 310 10*3/uL (ref 150–450)
RBC: 4.63 x10E6/uL (ref 4.14–5.80)
RDW: 15.2 % (ref 11.6–15.4)
WBC: 8.5 10*3/uL (ref 3.4–10.8)

## 2019-06-12 LAB — COMPREHENSIVE METABOLIC PANEL
ALT: 147 IU/L — ABNORMAL HIGH (ref 0–44)
AST: 102 IU/L — ABNORMAL HIGH (ref 0–40)
Albumin/Globulin Ratio: 0.7 — ABNORMAL LOW (ref 1.2–2.2)
Albumin: 3.6 g/dL — ABNORMAL LOW (ref 3.8–4.8)
Alkaline Phosphatase: 109 IU/L (ref 39–117)
BUN/Creatinine Ratio: 23 (ref 10–24)
BUN: 17 mg/dL (ref 8–27)
Bilirubin Total: 0.2 mg/dL (ref 0.0–1.2)
CO2: 26 mmol/L (ref 20–29)
Calcium: 10.1 mg/dL (ref 8.6–10.2)
Chloride: 94 mmol/L — ABNORMAL LOW (ref 96–106)
Creatinine, Ser: 0.73 mg/dL — ABNORMAL LOW (ref 0.76–1.27)
GFR calc Af Amer: 112 mL/min/{1.73_m2} (ref >59)
GFR calc non Af Amer: 97 mL/min/{1.73_m2} (ref >59)
Globulin, Total: 5.5 g/dL — ABNORMAL HIGH (ref 1.5–4.5)
Glucose: 201 mg/dL — ABNORMAL HIGH (ref 65–99)
Potassium: 5.2 mmol/L (ref 3.5–5.2)
Sodium: 133 mmol/L — ABNORMAL LOW (ref 134–144)
Total Protein: 9.1 g/dL — ABNORMAL HIGH (ref 6.0–8.5)

## 2019-06-12 LAB — CK: Total CK: 1432 U/L (ref 41–331)

## 2019-06-13 ENCOUNTER — Encounter: Payer: Self-pay | Admitting: Family Medicine

## 2019-06-13 ENCOUNTER — Telehealth: Payer: Self-pay | Admitting: Neurology

## 2019-06-13 NOTE — Telephone Encounter (Signed)
Sent physical therapy note order to [place] at [id number].

## 2019-06-13 NOTE — Telephone Encounter (Signed)
[facility] called requesting a prescription be faxed for the patient's appointment this afternoon at 1:00pm.  Fax: [phone]

## 2019-06-14 ENCOUNTER — Telehealth: Payer: Self-pay

## 2019-06-14 NOTE — Telephone Encounter (Signed)
Wife said to call her back at [phone]. Thanks!

## 2019-06-14 NOTE — Telephone Encounter (Signed)
-----   Message from Alda Berthold, DO sent at 06/14/2019  9:37 AM EDT ----- Please inform patient that his muscle enzymes (CK) continues to nicely trend down as do his liver enzymes. Blood sugars remain elevated.  Instruct him to reduce prednisone down to 40mg  daily.  He is scheduled to see me on 9/17.  Thanks.

## 2019-06-14 NOTE — Telephone Encounter (Signed)
Patient advised.

## 2019-06-14 NOTE — Telephone Encounter (Signed)
No answer at 1007

## 2019-06-20 ENCOUNTER — Encounter: Payer: Self-pay | Admitting: Family Medicine

## 2019-06-20 DIAGNOSIS — E538 Deficiency of other specified B group vitamins: Secondary | ICD-10-CM

## 2019-06-20 DIAGNOSIS — R739 Hyperglycemia, unspecified: Secondary | ICD-10-CM

## 2019-06-20 DIAGNOSIS — R7989 Other specified abnormal findings of blood chemistry: Secondary | ICD-10-CM

## 2019-06-20 DIAGNOSIS — R748 Abnormal levels of other serum enzymes: Secondary | ICD-10-CM

## 2019-06-20 DIAGNOSIS — T380X5A Adverse effect of glucocorticoids and synthetic analogues, initial encounter: Secondary | ICD-10-CM

## 2019-06-20 DIAGNOSIS — E7800 Pure hypercholesterolemia, unspecified: Secondary | ICD-10-CM

## 2019-06-20 NOTE — Addendum Note (Signed)
Addended by: Jasper Loser on: 06/20/2019 02:56 PM   Modules accepted: Orders

## 2019-06-22 ENCOUNTER — Encounter: Payer: Medicare Other | Admitting: Family Medicine

## 2019-06-26 ENCOUNTER — Ambulatory Visit: Payer: Self-pay | Admitting: Cardiology

## 2019-06-28 ENCOUNTER — Ambulatory Visit (INDEPENDENT_AMBULATORY_CARE_PROVIDER_SITE_OTHER): Payer: Medicare Other

## 2019-06-28 ENCOUNTER — Other Ambulatory Visit: Payer: Self-pay

## 2019-06-28 ENCOUNTER — Ambulatory Visit (INDEPENDENT_AMBULATORY_CARE_PROVIDER_SITE_OTHER): Payer: Medicare Other | Admitting: Neurology

## 2019-06-28 ENCOUNTER — Other Ambulatory Visit (INDEPENDENT_AMBULATORY_CARE_PROVIDER_SITE_OTHER): Payer: Medicare Other

## 2019-06-28 ENCOUNTER — Encounter: Payer: Self-pay | Admitting: Neurology

## 2019-06-28 VITALS — BP 126/70 | HR 78 | Ht 69.0 in | Wt 186.0 lb

## 2019-06-28 DIAGNOSIS — R7989 Other specified abnormal findings of blood chemistry: Secondary | ICD-10-CM | POA: Diagnosis not present

## 2019-06-28 DIAGNOSIS — T380X5A Adverse effect of glucocorticoids and synthetic analogues, initial encounter: Secondary | ICD-10-CM | POA: Diagnosis not present

## 2019-06-28 DIAGNOSIS — E538 Deficiency of other specified B group vitamins: Secondary | ICD-10-CM | POA: Diagnosis not present

## 2019-06-28 DIAGNOSIS — G7249 Other inflammatory and immune myopathies, not elsewhere classified: Secondary | ICD-10-CM

## 2019-06-28 DIAGNOSIS — R739 Hyperglycemia, unspecified: Secondary | ICD-10-CM

## 2019-06-28 DIAGNOSIS — R748 Abnormal levels of other serum enzymes: Secondary | ICD-10-CM | POA: Diagnosis not present

## 2019-06-28 DIAGNOSIS — Z23 Encounter for immunization: Secondary | ICD-10-CM

## 2019-06-28 DIAGNOSIS — E7800 Pure hypercholesterolemia, unspecified: Secondary | ICD-10-CM

## 2019-06-28 LAB — LIPID PANEL
Cholesterol: 304 mg/dL — ABNORMAL HIGH (ref 0–200)
HDL: 52.1 mg/dL (ref >39.00)
NonHDL: 252.36
Total CHOL/HDL Ratio: 6
Triglycerides: 231 mg/dL — ABNORMAL HIGH (ref 0.0–149.0)
VLDL: 46.2 mg/dL — ABNORMAL HIGH (ref 0.0–40.0)

## 2019-06-28 LAB — COMPREHENSIVE METABOLIC PANEL
ALT: 80 U/L — ABNORMAL HIGH (ref 0–53)
AST: 66 U/L — ABNORMAL HIGH (ref 0–37)
Albumin: 3.6 g/dL (ref 3.5–5.2)
Alkaline Phosphatase: 48 U/L (ref 39–117)
BUN: 16 mg/dL (ref 6–23)
CO2: 30 mEq/L (ref 19–32)
Calcium: 9.6 mg/dL (ref 8.4–10.5)
Chloride: 99 mEq/L (ref 96–112)
Creatinine, Ser: 0.78 mg/dL (ref 0.40–1.50)
GFR: 99.53 mL/min (ref >60.00)
Glucose, Bld: 104 mg/dL — ABNORMAL HIGH (ref 70–99)
Potassium: 4 mEq/L (ref 3.5–5.1)
Sodium: 137 mEq/L (ref 135–145)
Total Bilirubin: 0.4 mg/dL (ref 0.2–1.2)
Total Protein: 6.6 g/dL (ref 6.0–8.3)

## 2019-06-28 LAB — CBC WITH DIFFERENTIAL/PLATELET
Basophils Absolute: 0 10*3/uL (ref 0.0–0.1)
Basophils Relative: 0.3 % (ref 0.0–3.0)
Eosinophils Absolute: 0.1 10*3/uL (ref 0.0–0.7)
Eosinophils Relative: 1.2 % (ref 0.0–5.0)
HCT: 40 % (ref 39.0–52.0)
Hemoglobin: 13.3 g/dL (ref 13.0–17.0)
Lymphocytes Relative: 31.1 % (ref 12.0–46.0)
Lymphs Abs: 2.3 10*3/uL (ref 0.7–4.0)
MCHC: 33.2 g/dL (ref 30.0–36.0)
MCV: 93.5 fl (ref 78.0–100.0)
Monocytes Absolute: 0.4 10*3/uL (ref 0.1–1.0)
Monocytes Relative: 4.9 % (ref 3.0–12.0)
Neutro Abs: 4.6 10*3/uL (ref 1.4–7.7)
Neutrophils Relative %: 62.5 % (ref 43.0–77.0)
Platelets: 195 10*3/uL (ref 150.0–400.0)
RBC: 4.27 Mil/uL (ref 4.22–5.81)
RDW: 15.8 % — ABNORMAL HIGH (ref 11.5–15.5)
WBC: 7.4 10*3/uL (ref 4.0–10.5)

## 2019-06-28 LAB — VITAMIN B12: Vitamin B-12: 692 pg/mL (ref 211–911)

## 2019-06-28 LAB — HEMOGLOBIN A1C: Hgb A1c MFr Bld: 6.9 % — ABNORMAL HIGH (ref 4.6–6.5)

## 2019-06-28 LAB — TSH: TSH: 4.52 u[IU]/mL — ABNORMAL HIGH (ref 0.35–4.50)

## 2019-06-28 LAB — LDL CHOLESTEROL, DIRECT: Direct LDL: 216 mg/dL

## 2019-06-28 LAB — CK: Total CK: 1376 U/L — ABNORMAL HIGH (ref 7–232)

## 2019-06-28 NOTE — Progress Notes (Signed)
Follow-up Visit   Date: 06/28/19   Stanley Boyd MRN: [id number] DOB: 1951-05-03   Interim History: Stanley Boyd is a 68 y.o. 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  The patient was accompanied to the clinic by wife who also provides collateral information.    History of present illness: Early April 2020, he began having difficulty walking, especially raising his legs, such as when getting into a car.  This gradually progressed over the next 4 weeks to the point where he was unable to raise his leg to climb onto a step. Around the same time, he developed a skin rash of the chest and forearm.  He was evaluated by his PCP who noted 4-/5 muscle strength in the legs and [place] [address].  He was hospitalized at [facility] from 5/8 - 02/20/2019 for evaluation and hydration.  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  MRI left femur was most notable with diffuse, symmetric muscle edema and enhancement in the proximal hip girdle muscles. CK peaked at [address]. Left rectus femoris muscle biopsy and skin biopsy was performed.  He was emperically started on prednisone '80mg'$  due to high clinical suspicion for inflammatory myopathy, despite normal inflammatory/autoimmune markers (see below).  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  Skin biopsy showed benign lesion with fibrosis and mixed superficial and deep inflammation.  He has a history of CAD, followed by Dr. Einar Gip.  He was previously on statin therapy for ~ 2 years, but due to elevated liver enzymes, he was switched Praluent in 2019.  His LFTs have been elevated in the past,  the oldest labs in Epic are from November 2015 at [facility] which showed CK 219, AST 61*, ALT 90*, alk phos 126*.  He denies history of myalgias or cramps.    UPDATE 03/20/2019:  He is here for 2 week follow-up with his son, Dr. Thersa Salt.  He remains on prednisone '80mg'$  daily and CK last week was elevated at 7703.  He continues to have weakness of the arms and legs, no significant improvement despite being on prednisone '80mg'$  for almost a month. Weakness involves his upper arms and legs.  For example, applying deodorant is challenging because of inability to extend the arm and climb steps/stairs.  He is having greater difficulty walking, but is still walking unassisted. Fortunately, he has not suffered falls since his last visit.    UPDATE 04/10/2019:  He is here for 3 week follow-up visit.  He denies any significant change in degree of his weakness in the arms or legs since his last visit.  Specifically, he does not feel any symptoms have progressed.  His legs continue to feel very heavy and he suffered one fall and he was unable to stand up himself.  He is able to walk unassisted.  There has been delay in trying to get his IVIG approved due to his Medicare part B declining home infusion.  He is now scheduled to have this administered at the hospital next week.  He denies neck heaviness, dysphagia, or dysarthria.  [facility] labs have returned markedly positive for HMGCR antibodies (see below).    UPDATE 05/10/2019:  He is here for follow-up  visit.  He received initiation dose of IVIG in early July and besides a headache, tolerated this well.  He also started CellCept 500 mg twice daily and tapered his prednisone down to 60 mg daily, which he has been on for the past 2 weeks.  He denies any worsening arm or leg weakness.  He feels that his arm strength is slowly improving, because it is easier for him to apply deodorant and walking endurance is better.  He tells me that he walked two  football fields today, unassisted.    He did see Dr. Tillman Abide at [facility] for second opinion, who agreed with my management plan.  As per their recommendation, swallow evaluation was ordered and shows mild aspiration risk.  He was recommended to use chin tuck position when swallowing thin liquids.  UPDATE 06/28/2019: He is here for follow-up visit with his wife.  His CKs continue to trend down 1432 and his feeling much stronger than he was previously.  He is able to stand up if he was crouching on the floor without any difficulty.  He can climb stairs without difficulty.  He has noticed improved arm strength and can raise his arms higher.  He is now going to outpatient physical therapy and doing very well.     Medications:  Current Outpatient Medications on File Prior to Visit  Medication Sig Dispense Refill  . Alirocumab (PRALUENT) 150 MG/ML SOAJ Inject in to skin every 2 weeks Currently on hold    . aspirin 81 MG chewable tablet Chew 1 tablet (81 mg total) by mouth at bedtime.    . calcium-vitamin D (OSCAL WITH D) 500-200 MG-UNIT tablet Take 1 tablet by mouth 3 (three) times daily. 270 tablet 3  . cyanocobalamin (,VITAMIN B-12,) 1000 MCG/ML injection Inject IM daily for 7 days, then weekly for 4 weeks, then monthly 10 mL 0  . finasteride (PROSCAR) 5 MG tablet Take 5 mg by mouth at bedtime.     . finasteride (PROSCAR) 5 MG tablet     . fluticasone (FLONASE) 50 MCG/ACT nasal spray Place 2 sprays into both nostrils daily as needed for allergies or rhinitis.    . Immune Globulin 10% (IMMUNE GLOBULIN 10%) 10G/19m (10,'000mg'$ /1040m SOLN Inject 1 g/kg into the vein every 24 hours x 2 doses. 75 g every 21 days for 2 consecutive days    . metFORMIN (GLUCOPHAGE) 500 MG tablet Take 1 tablet (500 mg total) by mouth 2 (two) times daily with a meal. 180 tablet 3  . mycophenolate (CELLCEPT) 500 MG tablet Take 1 tablet (500 mg total) by mouth 2 (two) times daily. 60 tablet 3  .  nitroGLYCERIN (NITROSTAT) 0.4 MG SL tablet Place 1 tablet (0.4 mg total) under the tongue every 5 (five) minutes as needed for chest pain. 25 tablet 4  . pantoprazole (PROTONIX) 40 MG tablet Take 1 tablet (40 mg total) by mouth daily. 90 tablet 3  . Polyvinyl Alcohol-Povidone (REFRESH OP) Place 1 drop into both eyes daily.    . Marland KitchenRALUENT 150 MG/ML SOAJ Inject in to skin every 2 weeks Currently on hold    . predniSONE (DELTASONE) 20 MG tablet Reduce to '70mg'$  (3.5 tab) for 2 weeks, then '60mg'$  daily. (Patient taking differently: 40 mg. ) 120 tablet 3  . sulfamethoxazole-trimethoprim (BACTRIM DS) 800-160 MG tablet Take 1 tablet by mouth 3 (three) times a week. 40 tablet 3  . Syringe/Needle, Disp, (SYRINGE 3CC/22GX1") 22G X 1" 3 ML MISC Use with B12 injections  50 each 1  . tamsulosin (FLOMAX) 0.4 MG CAPS capsule Take 0.4 mg by mouth at bedtime.     No current facility-administered medications on file prior to visit.     Allergies:  Allergies  Allergen Reactions  . Statins Other (See Comments)    Myositis Myositis    Review of Systems:  CONSTITUTIONAL: No fevers, chills, night sweats, or weight loss.  EYES: No visual changes or eye pain ENT: No hearing changes.  No history of nose bleeds.   RESPIRATORY: No cough, wheezing and shortness of breath.   CARDIOVASCULAR: Negative for chest pain, and palpitations.   GI: Negative for abdominal discomfort, blood in stools or black stools.  No recent change in bowel habits.   GU:  No history of incontinence.   MUSCLOSKELETAL: No history of joint pain or swelling.  No myalgias.   SKIN: Negative for lesions, rash, and itching.   ENDOCRINE: Negative for cold or heat intolerance, polydipsia or goiter.   PSYCH:  No depression or anxiety symptoms.   NEURO: As Above.   Vital Signs:  BP 126/70   Pulse 78   Ht '5\' 9"'$  (1.753 m)   Wt 186 lb (84.4 kg)   SpO2 98%   BMI 27.47 kg/m   General Medical Exam:   General:  Well appearing, comfortable  Eyes/ENT:  see cranial nerve examination.   Neck:   No carotid bruits. Respiratory:  Clear to auscultation, good air entry bilaterally.   Cardiac:  Regular rate and rhythm, no murmur.   Ext:  No edema  Neurological Exam: MENTAL STATUS including orientation to time, place, person, recent and remote memory, attention span and concentration, language, and fund of knowledge is normal.  Speech is sounds stronger.  CRANIAL NERVES:   extra-ocular eye movements in all directions of gaze.  No ptosis.  Face is symmetric. Facial muscles are 5/5 bilaterally.   MOTOR:  Mild proximal leg atrophy, No fasciculations or abnormal movements.  No pronator drift.   Upper Extremity:  Right  Left  Deltoid  5/5   5/5   Biceps  5/5   5/5   Triceps * 5/5   5/5   Infraspinatus * 5-/5  5-/5  Medial pectoralis * 5-/5  5-/5  Wrist extensors  5-/5   5/5   Wrist flexors  5-/5   5/5   Finger extensors  4/5   5/5   Finger flexors  4/5   5/5   Dorsal interossei  4/5   5/5   Abductor pollicis  4/5   5/5   Tone (Ashworth scale)  0  0   Lower Extremity:  Right  Left  Hip flexors * 4/5   4/5   Hip extensors  5-/5   5-/5   Adductor 4+/5  4+/5  Abductor * 4+/5  4+/5  Knee flexors * 5/5   5/5   Knee extensors * 55   5/5   Dorsiflexors  5/5   5/5   Plantarflexors  5/5   5/5   Toe extensors  5/5   5/5   Toe flexors  5/5   5/5   Tone (Ashworth scale)  0  0   MSRs:  Reflexes are 3+/4 throughout except 2+/4 bilateral Achilles  COORDINATION/GAIT:  He is able to stand up from chair without using arms to push off.  Waddling gait, improved.  Appears steady.  Data: MRI cervical spine without contrast 02/17/2019: 1. 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 2. Solid anterior fusion at C4-5  and C5-6 without spinal stenosis at these levels. 3. Adjacent segment degenerative changes with moderate spinal stenosis at C6-7, mild-to-moderate spinal stenosis at C3-4, and mild-to-moderate neural foraminal stenosis at both levels.  MRI lumbar spine  02/17/2019: 1. Relatively diffuse lumbar disc and facet degeneration without significant spinal stenosis. 2. Multilevel neural foraminal stenosis, moderate on the left at L5-S1. 3. Normal appearance of the conus medullaris and cauda equina.  MRI thoracic spine without contrast 02/17/2019: 1. Mild thoracic disc and facet degeneration without evidence of neural impingement. 2. Minimal prominence of the central canal of the mid to lower thoracic spinal cord without syrinx.  MRI left femur with and without contrast 02/18/2019: 1. Abnormal diffuse muscular edema and enhancement involving various muscles of the lower pelvis and thigh as detailed above. The coronal images indicate that this is a grossly symmetric process. I note that there is a plan for quadriceps muscle biopsy; I would recommend targeting the rectus femoris muscle and referring to today's MRI images given that the other quadriceps muscles demonstrate only minimal involvement. 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  CT chest w contrast 02/17/2019: 1. No CT evidence of malignancy in the chest, abdomen, or pelvis. 2. Innumerable tiny centrilobular pulmonary nodules in an apically predominant distribution, which may reflect sequelae of atypical infection or smoking-related respiratory bronchiolitis. There is an unchanged, benign somewhat bandlike consolidation in the right lung base. 3. Coronary artery disease. 4. Atherosclerosis. 5. Prostatomegaly.  Labs 02/17/2019:  Aldolase 122, anti-Jo neg, ANA neg, anti-Scl 70 neg, CRP 1.1, ESR 12, Ehrlichia antibody neg, Lyme neg, acute hepatitis panel neg, HIV neg, VGCC neg,  paraneoplastic antibody panel neg 02/15/2019:  vitamin B12 162*, TSH 4.86*, free T4 0.78  Labs 08/2014 at [facility]:  CK 219, AST 61*, ALT 90*, alk phos 126*  Component      CK Total  Latest Ref Rng & Units      44 - 196 U/L  02/15/2019      14,781 (H)  02/16/2019     2:00 PM 16,516 (H)  02/16/2019     11:21 PM 17,630 (H)  02/17/2019      16,916 (H)  02/18/2019     12:42 AM 16,393 (H)  02/18/2019     12:15 PM 15,260 (H)  02/19/2019     12:12 AM 12,702 (H)  02/19/2019     7:01 AM 12,575 (H)  02/20/2019      11,953 (H)  02/26/2019      6,626 (H)  03/20/2019      7,372 (H)   Lab Results  Component Value Date   CKTOTAL 1,376 (H) 06/28/2019   CKMB 10 (H) 03/07/2019   TROPONINI 0.03 ([place]) 02/17/2019    Left rectus femorus muscle biopsy 02/20/2019:  Active necrotizing inflammatory myopathy. 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  Diagnosis was confirmed by antibody testing and supported by pathology findings.  He has been off statin therapy for ~1 year.  He was started on IVIG and Cellcept in July 2020 and started prednisone taper.  He is on prednisone '40mg'$  daily and doing well on prednisone taper.  His CK has been trending down and motor strength has been improving.  On exam today, he is 4+ at the hip flexors, which is markedly improved from prior visit where he was 2+; he still has mild weakness with internal and external rotation of the shoulder but otherwise upper extremity strength is back to baseline.  His gait continues to have Trendelenburg pattern, but overall appears stable.   PLAN/RECOMMENDATIONS:  1.  Continue IVIG '1mg'$ /kg every 21 days, if he continues to do well, I will taper frequency to every 28 days, but only after he is on less  than 20 mg of prednisone. 2.  Continue Cellcept '500mg'$  BID.  Monitor labs for weekly x 1, then monthly with CK. 3.  Continue prednisone '40mg'$  daily x 2 weeks, then reduce to '30mg'$  daily. Continue calcium, bactrim, and protonix 4.  OK to restart Praluent  5.  Continue monthly B12 injections 6.  Continue outpatient physical therapy  Return to clinic in 6 weeks  Greater than 50% of this 25 minute visit was spent in counseling, explanation of diagnosis, planning of further management, and coordination of care.   Thank you for allowing me to participate in patient's care.  If I can answer any additional questions, I would be pleased to do so.    Sincerely,    Kirklin Mcduffee K. Posey Pronto, DO

## 2019-06-28 NOTE — Patient Instructions (Addendum)
Continue prednisone 40mg  daily for two more weeks.  As long as your CK is better than before, reduce to 30mg  daily.   Continue IVIG every 3 weeks  Continue Cellcept 500mg  BID  Continue physical therapy  Check CK one week prior to appointment   Return to clinic Friday, October 30th at 3:30p.

## 2019-07-02 ENCOUNTER — Encounter (HOSPITAL_COMMUNITY): Payer: Self-pay

## 2019-07-02 ENCOUNTER — Other Ambulatory Visit: Payer: Self-pay

## 2019-07-02 ENCOUNTER — Ambulatory Visit (HOSPITAL_COMMUNITY)
Admission: RE | Admit: 2019-07-02 | Discharge: 2019-07-02 | Disposition: A | Discharge status: Home / Self Care | Payer: Medicare Other | Source: Ambulatory Visit | Attending: Neurology | Admitting: Neurology

## 2019-07-02 DIAGNOSIS — G7249 Other inflammatory and immune myopathies, not elsewhere classified: Secondary | ICD-10-CM | POA: Diagnosis present

## 2019-07-02 MED ORDER — IMMUNE GLOBULIN (HUMAN) 5 GM/50ML IV SOLN
1.0000 g/kg | INTRAVENOUS | Status: DC
  Administered 2019-07-02: 85 g via INTRAVENOUS
  Filled 2019-07-02: qty 800

## 2019-07-02 MED ORDER — ACETAMINOPHEN 325 MG PO TABS
ORAL_TABLET | ORAL | Status: AC
  Filled 2019-07-02: qty 1

## 2019-07-02 MED ORDER — ACETAMINOPHEN 325 MG PO TABS
325.0000 mg | ORAL_TABLET | ORAL | Status: DC
  Administered 2019-07-02: 325 mg via ORAL

## 2019-07-02 MED ORDER — DIPHENHYDRAMINE HCL 25 MG PO CAPS
ORAL_CAPSULE | ORAL | Status: AC
  Filled 2019-07-02: qty 1

## 2019-07-02 MED ORDER — DIPHENHYDRAMINE HCL 25 MG PO CAPS
25.0000 mg | ORAL_CAPSULE | ORAL | Status: DC
  Administered 2019-07-02: 25 mg via ORAL

## 2019-07-02 MED ORDER — DEXTROSE 5 % IV SOLN
INTRAVENOUS | Status: DC
  Administered 2019-07-02: 08:00:00 via INTRAVENOUS

## 2019-07-03 ENCOUNTER — Ambulatory Visit (HOSPITAL_COMMUNITY)
Admission: RE | Admit: 2019-07-03 | Discharge: 2019-07-03 | Disposition: A | Discharge status: Home / Self Care | Payer: Medicare Other | Source: Ambulatory Visit | Attending: Neurology | Admitting: Neurology

## 2019-07-03 DIAGNOSIS — G7249 Other inflammatory and immune myopathies, not elsewhere classified: Secondary | ICD-10-CM | POA: Diagnosis present

## 2019-07-03 MED ORDER — DIPHENHYDRAMINE HCL 25 MG PO CAPS
ORAL_CAPSULE | ORAL | Status: AC
  Filled 2019-07-03: qty 1

## 2019-07-03 MED ORDER — DIPHENHYDRAMINE HCL 25 MG PO CAPS
25.0000 mg | ORAL_CAPSULE | ORAL | Status: DC
  Administered 2019-07-03: 25 mg via ORAL

## 2019-07-03 MED ORDER — ACETAMINOPHEN 325 MG PO TABS
ORAL_TABLET | ORAL | Status: AC
  Administered 2019-07-03: 325 mg via ORAL
  Filled 2019-07-03: qty 1

## 2019-07-03 MED ORDER — DEXTROSE 5 % IV SOLN
INTRAVENOUS | Status: DC
  Administered 2019-07-03: 08:00:00 via INTRAVENOUS

## 2019-07-03 MED ORDER — ACETAMINOPHEN 325 MG PO TABS
325.0000 mg | ORAL_TABLET | ORAL | Status: DC
  Administered 2019-07-03: 08:00:00 325 mg via ORAL

## 2019-07-03 MED ORDER — IMMUNE GLOBULIN (HUMAN) 5 GM/50ML IV SOLN
1.0000 g/kg | INTRAVENOUS | Status: DC
  Administered 2019-07-03: 85 g via INTRAVENOUS
  Filled 2019-07-03: qty 800

## 2019-07-09 ENCOUNTER — Encounter: Payer: Self-pay | Admitting: Family Medicine
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
## 2019-07-09 NOTE — Patient Instructions (Addendum)
Health Maintenance Due  Topic Date Due  . COLONOSCOPY -waiting until COVID calms down and strength improves 04/06/2019   Double check on shingrix with [facility]- we can log in computer if you had this  Get labs before next visit. Schedule visit in 6 months when you see Dr. Einar Gip

## 2019-07-13 ENCOUNTER — Encounter: Payer: Self-pay | Admitting: Cardiology

## 2019-07-13 ENCOUNTER — Other Ambulatory Visit: Payer: Self-pay

## 2019-07-13 ENCOUNTER — Ambulatory Visit (INDEPENDENT_AMBULATORY_CARE_PROVIDER_SITE_OTHER): Payer: Medicare Other | Admitting: Cardiology

## 2019-07-13 ENCOUNTER — Ambulatory Visit (INDEPENDENT_AMBULATORY_CARE_PROVIDER_SITE_OTHER): Payer: Medicare Other | Admitting: Family Medicine

## 2019-07-13 VITALS — BP 134/64 | HR 61 | Ht 69.0 in | Wt 190.5 lb

## 2019-07-13 VITALS — BP 138/82 | HR 58 | Temp 98.4°F | Ht 69.0 in | Wt 190.6 lb

## 2019-07-13 DIAGNOSIS — R945 Abnormal results of liver function studies: Secondary | ICD-10-CM

## 2019-07-13 DIAGNOSIS — E7800 Pure hypercholesterolemia, unspecified: Secondary | ICD-10-CM

## 2019-07-13 DIAGNOSIS — R739 Hyperglycemia, unspecified: Secondary | ICD-10-CM

## 2019-07-13 DIAGNOSIS — Z789 Other specified health status: Secondary | ICD-10-CM | POA: Diagnosis not present

## 2019-07-13 DIAGNOSIS — I2510 Atherosclerotic heart disease of native coronary artery without angina pectoris: Secondary | ICD-10-CM

## 2019-07-13 DIAGNOSIS — E538 Deficiency of other specified B group vitamins: Secondary | ICD-10-CM

## 2019-07-13 DIAGNOSIS — T380X5A Adverse effect of glucocorticoids and synthetic analogues, initial encounter: Secondary | ICD-10-CM

## 2019-07-13 DIAGNOSIS — G7249 Other inflammatory and immune myopathies, not elsewhere classified: Secondary | ICD-10-CM | POA: Diagnosis not present

## 2019-07-13 DIAGNOSIS — Z0000 Encounter for general adult medical examination without abnormal findings: Secondary | ICD-10-CM | POA: Diagnosis not present

## 2019-07-13 DIAGNOSIS — I69959 Hemiplegia and hemiparesis following unspecified cerebrovascular disease affecting unspecified side: Secondary | ICD-10-CM

## 2019-07-13 DIAGNOSIS — R7989 Other specified abnormal findings of blood chemistry: Secondary | ICD-10-CM

## 2019-07-13 MED ORDER — NITROGLYCERIN 0.4 MG SL SUBL: 0.4000 mg | SUBLINGUAL_TABLET | SUBLINGUAL | 4 refills | Status: DC | PRN
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
## 2019-07-26 ENCOUNTER — Other Ambulatory Visit: Payer: Self-pay

## 2019-07-26 ENCOUNTER — Encounter (HOSPITAL_COMMUNITY)
Admission: RE | Admit: 2019-07-26 | Discharge: 2019-07-26 | Disposition: A | Discharge status: Home / Self Care | Payer: Medicare Other | Source: Ambulatory Visit | Attending: Neurology | Admitting: Neurology

## 2019-07-26 ENCOUNTER — Encounter (HOSPITAL_COMMUNITY): Payer: Self-pay

## 2019-07-26 DIAGNOSIS — G7249 Other inflammatory and immune myopathies, not elsewhere classified: Secondary | ICD-10-CM | POA: Diagnosis present

## 2019-07-26 MED ORDER — ACETAMINOPHEN 325 MG PO TABS
325.0000 mg | ORAL_TABLET | ORAL | Status: DC
  Administered 2019-07-26: 325 mg via ORAL
  Filled 2019-07-26: qty 1

## 2019-07-26 MED ORDER — IMMUNE GLOBULIN (HUMAN) 5 GM/50ML IV SOLN
1.0000 g/kg | INTRAVENOUS | Status: DC
  Administered 2019-07-26: 85 g via INTRAVENOUS
  Filled 2019-07-26: qty 800

## 2019-07-26 MED ORDER — DIPHENHYDRAMINE HCL 25 MG PO CAPS
25.0000 mg | ORAL_CAPSULE | ORAL | Status: DC
  Administered 2019-07-26: 25 mg via ORAL
  Filled 2019-07-26: qty 1

## 2019-07-26 MED ORDER — DEXTROSE 5 % IV SOLN
INTRAVENOUS | Status: DC
  Administered 2019-07-26: 09:00:00 via INTRAVENOUS

## 2019-07-26 NOTE — Discharge Instructions (Signed)
Immune Globulin Injection What is this medicine? IMMUNE GLOBULIN (im MUNE GLOB yoo lin) helps to prevent or reduce the severity of certain infections in patients who are at risk. This medicine is collected from the pooled blood of many donors. It is used to treat immune system problems, thrombocytopenia, and Kawasaki syndrome. This medicine may be used for other purposes; ask your health care provider or pharmacist if you have questions. COMMON BRAND NAME(S): ASCENIV, Baygam, BIVIGAM, Carimune, Carimune NF, cutaquig, Cuvitru, Flebogamma, Flebogamma DIF, GamaSTAN, GamaSTAN S/D, Gamimune N, Gammagard, Gammagard S/D, Gammaked, Gammaplex, Gammar-P IV, Gamunex, Gamunex-C, Hizentra, Iveegam, Iveegam EN, Octagam, Panglobulin, Panglobulin NF, panzyga, Polygam S/D, Privigen, Sandoglobulin, Venoglobulin-S, Vigam, Vivaglobulin, Xembify What should I tell my health care provider before I take this medicine? They need to know if you have any of these conditions:   diabetes   extremely low or no immune antibodies in the blood   heart disease   history of blood clots   hyperprolinemia   infection in the blood, sepsis   kidney disease   taking medicine that may change kidney function - ask your health care provider about your medicine   an unusual or allergic reaction to human immune globulin, albumin, maltose, sucrose, polysorbate 80, other medicines, foods, dyes, or preservatives   pregnant or trying to get pregnant   breast-feeding How should I use this medicine? This medicine is for injection into a muscle or infusion into a vein or skin. It is usually given by a health care professional in a hospital or clinic setting. In rare cases, some brands of this medicine might be given at home. You will be taught how to give this medicine. Use exactly as directed. Take your medicine at regular intervals. Do not take your medicine more often than directed. Talk to your pediatrician regarding  the use of this medicine in children. Special care may be needed. Overdosage: If you think you have taken too much of this medicine contact a poison control center or emergency room at once. NOTE: This medicine is only for you. Do not share this medicine with others. What if I miss a dose? It is important not to miss your dose. Call your doctor or health care professional if you are unable to keep an appointment. If you give yourself the medicine and you miss a dose, take it as soon as you can. If it is almost time for your next dose, take only that dose. Do not take double or extra doses. What may interact with this medicine?  aspirin and aspirin-like medicines  cisplatin  cyclosporine  medicines for infection like acyclovir, adefovir, amphotericin B, bacitracin, cidofovir, foscarnet, ganciclovir, gentamicin, pentamidine, vancomycin  NSAIDS, medicines for pain and inflammation, like ibuprofen or naproxen  pamidronate  vaccines  zoledronic acid This list may not describe all possible interactions. Give your health care provider a list of all the medicines, herbs, non-prescription drugs, or dietary supplements you use. Also tell them if you smoke, drink alcohol, or use illegal drugs. Some items may interact with your medicine. What should I watch for while using this medicine? Your condition will be monitored carefully while you are receiving this medicine. This medicine is made from pooled blood donations of many different people. It may be possible to pass an infection in this medicine. However, the donors are screened for infections and all products are tested for HIV and hepatitis. The medicine is treated to kill most or all bacteria and viruses. Talk to your doctor about   the risks and benefits of this medicine. Do not have vaccinations for at least 14 days before, or until at least 3 months after receiving this medicine. What side effects may I notice from receiving this  medicine? Side effects that you should report to your doctor or health care professional as soon as possible:  allergic reactions like skin rash, itching or hives, swelling of the face, lips, or tongue  breathing problems  chest pain or tightness  fever, chills  headache with nausea, vomiting  neck pain or difficulty moving neck  pain when moving eyes  pain, swelling, warmth in the leg  problems with balance, talking, walking  sudden weight gain  swelling of the ankles, feet, hands  trouble passing urine or change in the amount of urine Side effects that usually do not require medical attention (report to your doctor or health care professional if they continue or are bothersome):  dizzy, drowsy  flushing  increased sweating  leg cramps  muscle aches and pains  pain at site where injected This list may not describe all possible side effects. Call your doctor for medical advice about side effects. You may report side effects to FDA at 1-800-FDA-1088. Where should I keep my medicine? Keep out of the reach of children. This drug is usually given in a hospital or clinic and will not be stored at home. In rare cases, some brands of this medicine may be given at home. If you are using this medicine at home, you will be instructed on how to store this medicine. Throw away any unused medicine after the expiration date on the label. NOTE: This sheet is a summary. It may not cover all possible information. If you have questions about this medicine, talk to your doctor, pharmacist, or health care provider.  2020 Elsevier/Gold Standard (2008-12-18 11:44:49)  

## 2019-07-27 ENCOUNTER — Encounter (HOSPITAL_COMMUNITY): Payer: Medicare Other

## 2019-07-27 ENCOUNTER — Ambulatory Visit (HOSPITAL_COMMUNITY)
Admission: RE | Admit: 2019-07-27 | Discharge: 2019-07-27 | Disposition: A | Discharge status: Home / Self Care | Payer: Medicare Other | Source: Ambulatory Visit | Attending: Neurology | Admitting: Neurology

## 2019-07-27 DIAGNOSIS — Z79899 Other long term (current) drug therapy: Secondary | ICD-10-CM | POA: Insufficient documentation

## 2019-07-27 DIAGNOSIS — E785 Hyperlipidemia, unspecified: Secondary | ICD-10-CM | POA: Diagnosis not present

## 2019-07-27 DIAGNOSIS — G7249 Other inflammatory and immune myopathies, not elsewhere classified: Secondary | ICD-10-CM | POA: Diagnosis present

## 2019-07-27 DIAGNOSIS — Z7982 Long term (current) use of aspirin: Secondary | ICD-10-CM | POA: Insufficient documentation

## 2019-07-27 DIAGNOSIS — I2510 Atherosclerotic heart disease of native coronary artery without angina pectoris: Secondary | ICD-10-CM | POA: Insufficient documentation

## 2019-07-27 MED ORDER — DEXTROSE 5 % IV SOLN
INTRAVENOUS | Status: DC
  Administered 2019-07-27: 08:00:00 via INTRAVENOUS

## 2019-07-27 MED ORDER — IMMUNE GLOBULIN (HUMAN) 5 GM/50ML IV SOLN
1.0000 g/kg | INTRAVENOUS | Status: DC
  Administered 2019-07-27: 85 g via INTRAVENOUS
  Filled 2019-07-27: qty 800

## 2019-07-27 MED ORDER — DEXTROSE 5 % IV SOLN: INTRAVENOUS | Status: DC

## 2019-07-27 MED ORDER — DIPHENHYDRAMINE HCL 25 MG PO CAPS: 25.0000 mg | ORAL_CAPSULE | ORAL | Status: DC

## 2019-07-27 MED ORDER — ACETAMINOPHEN 325 MG PO TABS: 325.0000 mg | ORAL_TABLET | ORAL | Status: DC

## 2019-07-27 MED ORDER — IMMUNE GLOBULIN (HUMAN) 5 GM/50ML IV SOLN: 1.0000 g/kg | INTRAVENOUS | Status: DC

## 2019-07-27 MED ORDER — ACETAMINOPHEN 325 MG PO TABS
325.0000 mg | ORAL_TABLET | ORAL | Status: DC
  Administered 2019-07-27: 325 mg via ORAL
  Filled 2019-07-27: qty 1

## 2019-07-27 MED ORDER — DIPHENHYDRAMINE HCL 25 MG PO CAPS
25.0000 mg | ORAL_CAPSULE | ORAL | Status: DC
  Administered 2019-07-27: 25 mg via ORAL
  Filled 2019-07-27: qty 1

## 2019-07-31 ENCOUNTER — Other Ambulatory Visit: Payer: Self-pay

## 2019-07-31 DIAGNOSIS — R531 Weakness: Secondary | ICD-10-CM

## 2019-07-31 DIAGNOSIS — E538 Deficiency of other specified B group vitamins: Secondary | ICD-10-CM

## 2019-07-31 DIAGNOSIS — G7249 Other inflammatory and immune myopathies, not elsewhere classified: Secondary | ICD-10-CM

## 2019-07-31 NOTE — Telephone Encounter (Signed)
Orders placed and faxed to lab number the pt provided.

## 2019-08-03 ENCOUNTER — Other Ambulatory Visit: Payer: Self-pay | Admitting: Neurology

## 2019-08-04 LAB — CBC/DIFF AMBIGUOUS DEFAULT
Basophils Absolute: 0 10*3/uL (ref 0.0–0.2)
Basos: 0 %
EOS (ABSOLUTE): 0 10*3/uL (ref 0.0–0.4)
Eos: 0 %
Hematocrit: 39.2 % (ref 37.5–51.0)
Hemoglobin: 13 g/dL (ref 13.0–17.7)
Immature Grans (Abs): 0 10*3/uL (ref 0.0–0.1)
Immature Granulocytes: 1 %
Lymphocytes Absolute: 1.3 10*3/uL (ref 0.7–3.1)
Lymphs: 21 %
MCH: 30.7 pg (ref 26.6–33.0)
MCHC: 33.2 g/dL (ref 31.5–35.7)
MCV: 93 fL (ref 79–97)
Monocytes Absolute: 0.4 10*3/uL (ref 0.1–0.9)
Monocytes: 6 %
Neutrophils Absolute: 4.7 10*3/uL (ref 1.4–7.0)
Neutrophils: 72 %
Platelets: 211 10*3/uL (ref 150–450)
RBC: 4.23 x10E6/uL (ref 4.14–5.80)
RDW: 13.8 % (ref 11.6–15.4)
WBC: 6.4 10*3/uL (ref 3.4–10.8)

## 2019-08-04 LAB — CK: Total CK: 739 U/L (ref 41–331)

## 2019-08-04 LAB — COMPREHENSIVE METABOLIC PANEL
ALT: 71 IU/L — ABNORMAL HIGH (ref 0–44)
AST: 54 IU/L — ABNORMAL HIGH (ref 0–40)
Albumin/Globulin Ratio: 0.9 — ABNORMAL LOW (ref 1.2–2.2)
Albumin: 4 g/dL (ref 3.8–4.8)
Alkaline Phosphatase: 54 IU/L (ref 39–117)
BUN/Creatinine Ratio: 19 (ref 10–24)
BUN: 16 mg/dL (ref 8–27)
Bilirubin Total: 0.2 mg/dL (ref 0.0–1.2)
CO2: 25 mmol/L (ref 20–29)
Calcium: 9.8 mg/dL (ref 8.6–10.2)
Chloride: 98 mmol/L (ref 96–106)
Creatinine, Ser: 0.85 mg/dL (ref 0.76–1.27)
GFR calc Af Amer: 105 mL/min/{1.73_m2} (ref >59)
GFR calc non Af Amer: 91 mL/min/{1.73_m2} (ref >59)
Globulin, Total: 4.6 g/dL — ABNORMAL HIGH (ref 1.5–4.5)
Glucose: 107 mg/dL — ABNORMAL HIGH (ref 65–99)
Potassium: 4.4 mmol/L (ref 3.5–5.2)
Sodium: 133 mmol/L — ABNORMAL LOW (ref 134–144)
Total Protein: 8.6 g/dL — ABNORMAL HIGH (ref 6.0–8.5)

## 2019-08-04 LAB — SPECIMEN STATUS REPORT

## 2019-08-08 IMAGING — US ULTRASOUND BIOPSY CORE LIVER
1 series · 11 of 11 positions shown · non-contrast
Comparison: CT the chest, abdomen and pelvis - 02/17/2019

INDICATION: Elevated LFTs of uncertain etiology. Please perform
ultrasound-guided liver biopsy tissue diagnostic purposes.

EXAM:
ULTRASOUND GUIDED LIVER BIOPSY

[Series 1: ultrasound biopsy core liver · 0.23mm/px · 11 of 11 slices shown]
[im 1/11]
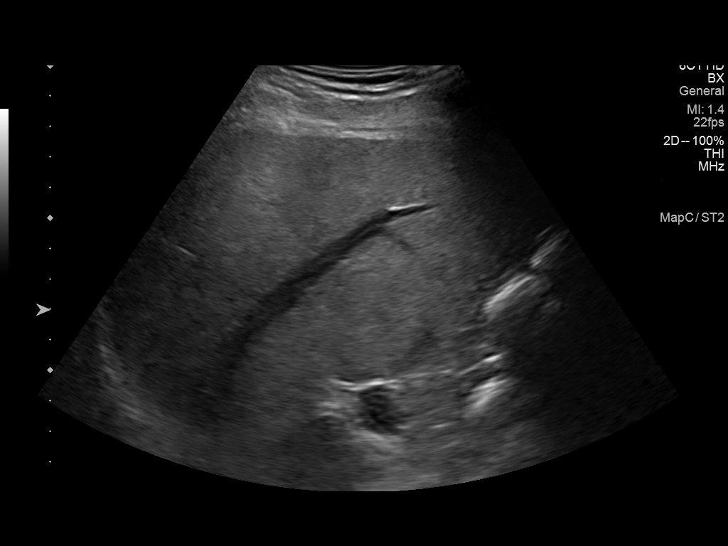
[im 2/11]
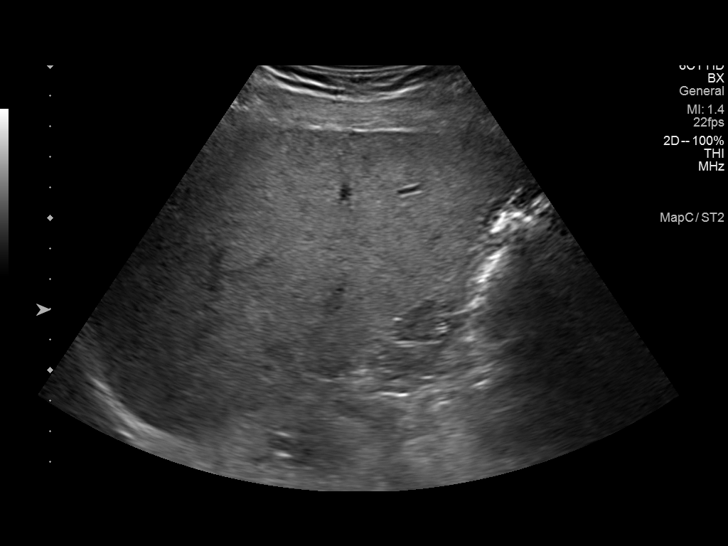
[im 3/11]
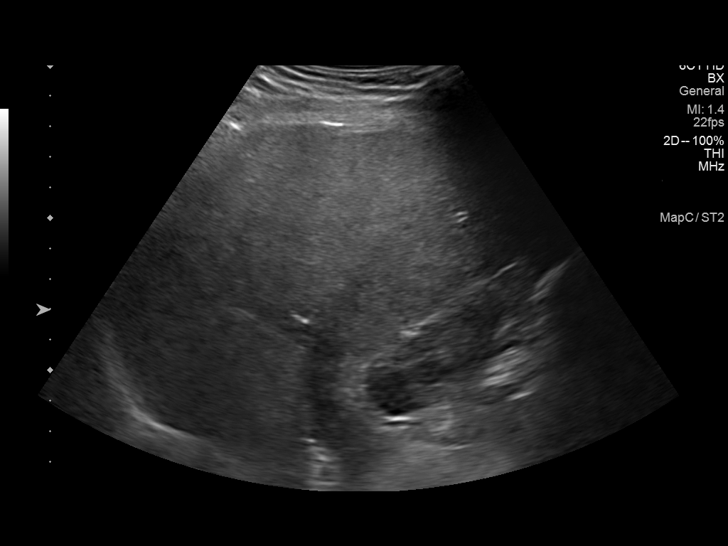
[im 4/11]
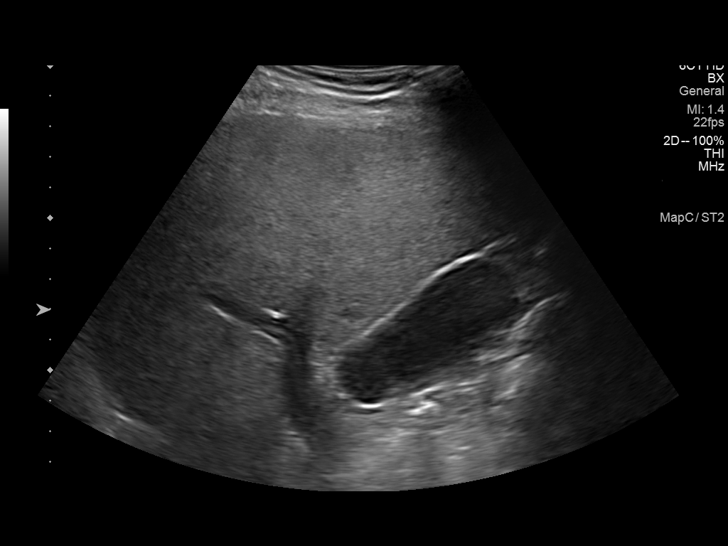
[im 5/11]
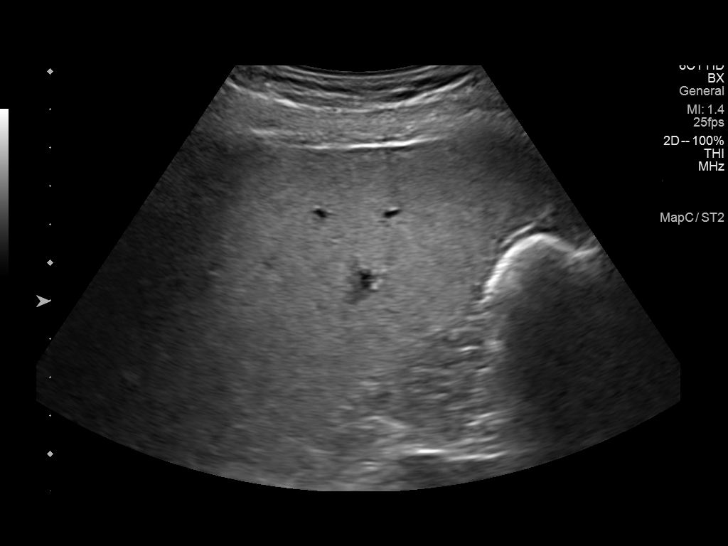
[im 6/11]
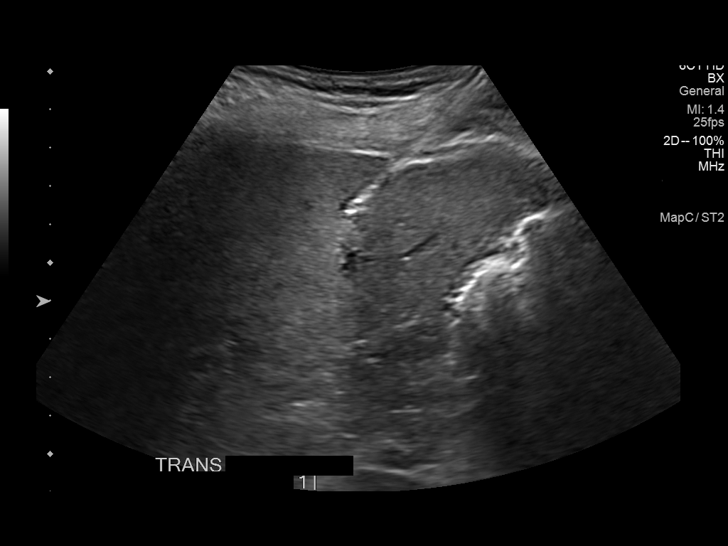
[im 7/11]
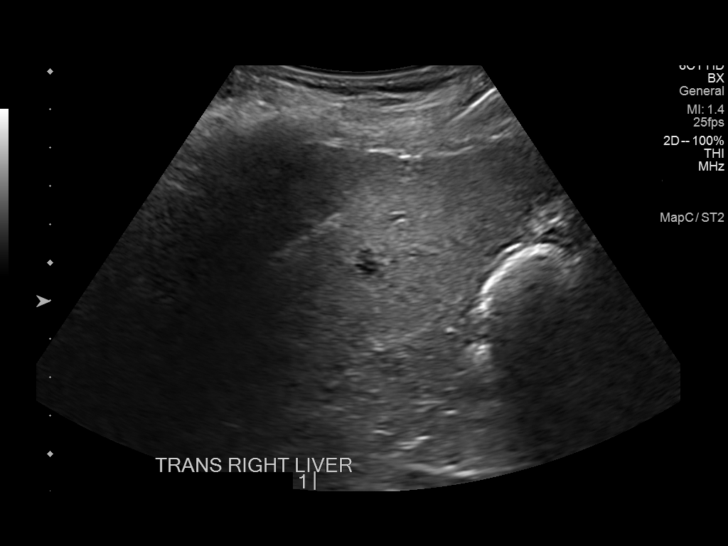
[im 8/11]
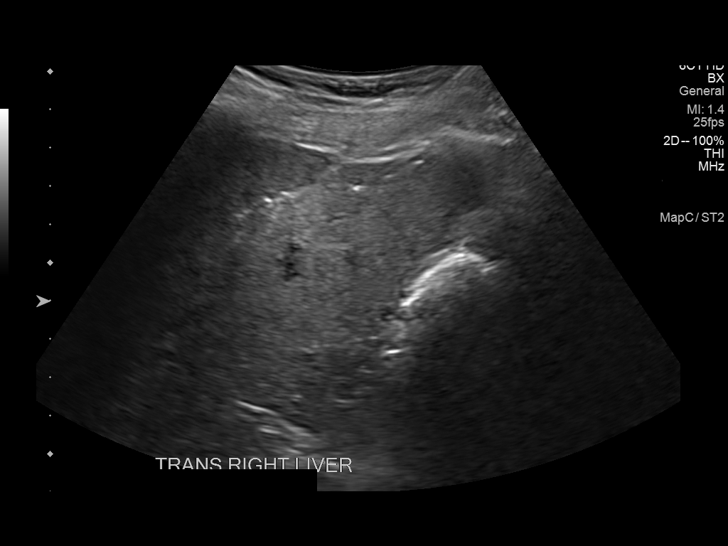
[im 9/11]
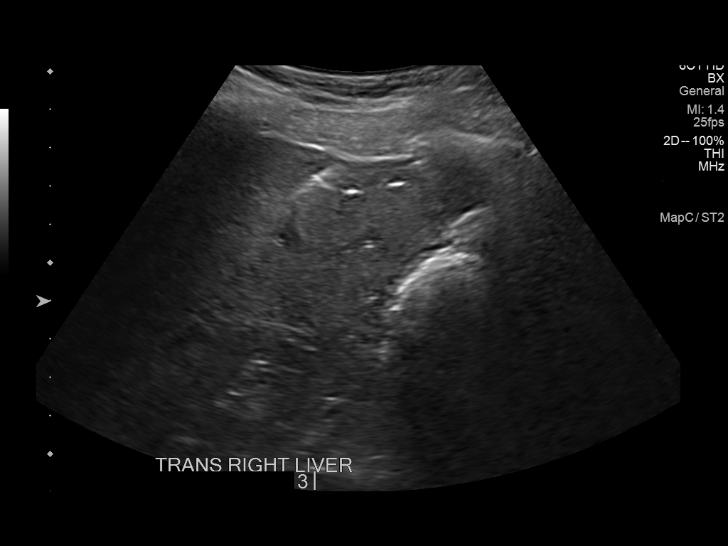
[im 10/11]
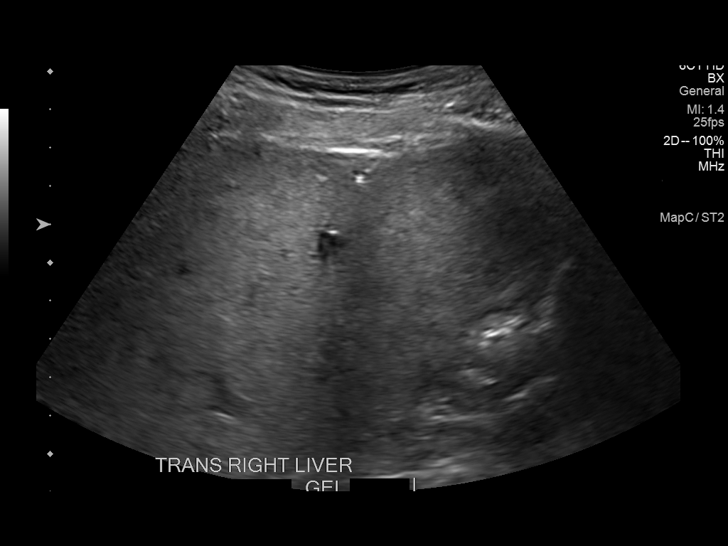
[im 11/11]
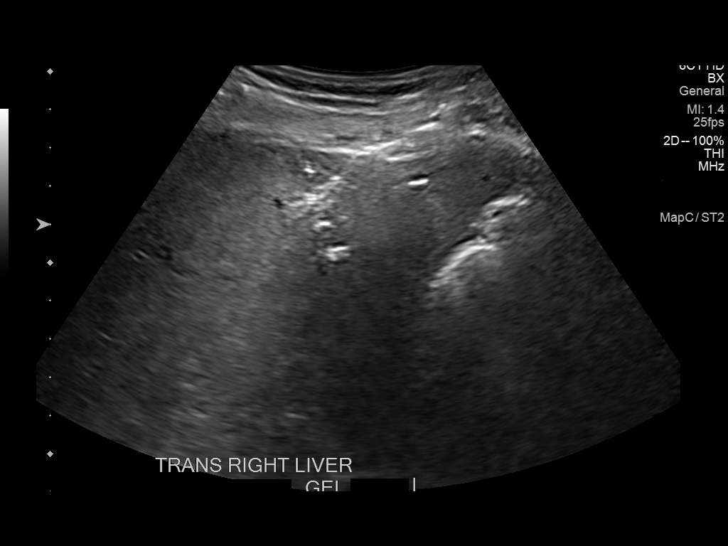

[11 of 11 positions shown; findings below may reference images not displayed]

MEDICATIONS:
None

ANESTHESIA/SEDATION:
Fentanyl 75 mcg IV; Versed 3 mg IV

Total Moderate Sedation time: 11 minutes; The patient was
continuously monitored during the procedure by the interventional
radiology nurse under my direct supervision.

COMPLICATIONS:
None immediate.

PROCEDURE:
Informed written consent was obtained from the patient after a
discussion of the risks, benefits and alternatives to treatment. The
patient understands and consents the procedure. A timeout was
performed prior to the initiation of the procedure.

Ultrasound scanning was performed of the right upper abdominal
quadrant and the procedure was planned. The right upper abdomen was
prepped and draped in the usual sterile fashion. The overlying soft
tissues were anesthetized with 1% lidocaine with epinephrine. A 17
gauge, 6.8 cm co-axial needle was advanced into a peripheral aspect
of the right lobe of the liver and 3 core biopsies were obtained
with an 18 gauge core device under direct ultrasound guidance.

The co-axial needle track was embolized with the administration of a
Gel-Foam slurry. Superficial hemostasis was obtained with manual
compression. Post procedural scanning was negative for definitive
area of hemorrhage. A dressing was placed. The patient tolerated the
procedure well without immediate post procedural complication.
IMPRESSION: Technically successful ultrasound guided liver biopsy.

## 2019-08-10 ENCOUNTER — Encounter: Payer: Self-pay | Admitting: Neurology

## 2019-08-10 ENCOUNTER — Other Ambulatory Visit: Payer: Self-pay

## 2019-08-10 ENCOUNTER — Ambulatory Visit (INDEPENDENT_AMBULATORY_CARE_PROVIDER_SITE_OTHER): Payer: Medicare Other | Admitting: Neurology

## 2019-08-10 VITALS — BP 112/82 | HR 75 | Ht 69.0 in | Wt 195.0 lb

## 2019-08-10 DIAGNOSIS — G7249 Other inflammatory and immune myopathies, not elsewhere classified: Secondary | ICD-10-CM | POA: Diagnosis not present

## 2019-08-10 MED ORDER — PREDNISONE 20 MG PO TABS: 20.0000 mg | ORAL_TABLET | Freq: Every day | ORAL | 3 refills | Status: DC

## 2019-08-10 NOTE — Patient Instructions (Addendum)
Reduce prednisone 20mg    Check labs in 1 month  Return to clinic on 12/11 at 11:10am.

## 2019-08-10 NOTE — Progress Notes (Signed)
Follow-up Visit   Date: 08/10/19   Stanley Boyd MRN: [id number] DOB: October 17, 1950   Interim History: Stanley Boyd is a 68 y.o. 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  The patient was accompanied to the clinic by wife who also provides collateral information.    History of present illness: Early April 2020, he began having difficulty walking, especially raising his legs, such as when getting into a car.  This gradually progressed over the next 4 weeks to the point where he was unable to raise his leg to climb onto a step. Around the same time, he developed a skin rash of the chest and forearm.  He was evaluated by his PCP who noted 4-/5 muscle strength in the legs and markedly [place] at [address].  He was hospitalized at [facility] from 5/8 - 02/20/2019 for evaluation and hydration.  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  MRI left femur was most notable with diffuse, symmetric muscle edema and enhancement in the proximal hip girdle muscles. CK peaked at [address]. Left rectus femoris muscle biopsy and skin biopsy was performed.  He was emperically started on prednisone 18m due to high clinical suspicion for inflammatory myopathy, despite normal inflammatory/autoimmune markers (see below).  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  Skin biopsy showed benign lesion with fibrosis and mixed superficial and deep inflammation.  He has a history of CAD, followed by Dr. GEinar Gip  He was previously on statin therapy for ~ 2 years, but due to elevated liver enzymes, he was switched Praluent in 2019.  His LFTs have been elevated in the past,  [facility] 219, AST 61*, ALT 90*, alk phos 126*.  He denies history of myalgias or cramps.    UPDATE 03/20/2019:  He is here for 2 week follow-up with his son, Dr. [name]  He remains on prednisone 862mdaily and CK last week was elevated at 7703.  He continues to have weakness of the arms and legs, no significant improvement despite being on prednisone 8058mor almost a month. Weakness involves his upper arms and legs.  For example, applying deodorant is challenging because of inability to extend the arm and climb steps/stairs.  He is having greater difficulty walking, but is still walking unassisted. Fortunately, he has not suffered falls since his last visit.    UPDATE 04/10/2019:  He is here for 3 week follow-up visit.  He denies any significant change in degree of his weakness in the arms or legs since his last visit.  Specifically, he does not feel any symptoms have progressed.  His legs continue to feel very heavy and he suffered one fall and he was unable to stand up himself.  He is able to walk unassisted.  There has been delay in trying to get his IVIG approved due to his Medicare part B declining home infusion.  He is now scheduled to have this administered at the hospital next week.  He denies neck heaviness, dysphagia, or dysarthria.  [facility] HMGCR [facility] (see below).    UPDATE 05/10/2019:  He is here for follow-up  visit.  He received initiation dose of IVIG in early July and besides a headache, tolerated this well.  He also started CellCept 500 mg twice daily and tapered his prednisone down to 60 mg daily, which he has been on for the past 2 weeks.  He denies any worsening arm or leg weakness.  He feels that his arm strength is slowly improving, because it is easier for him to apply deodorant and walking endurance is better.  He tells me that he walked two  football fields today, unassisted.    He did see Dr. Tillman Abide at [facility] for second opinion, who agreed with my management plan.  As per their recommendation, swallow evaluation was ordered and shows mild aspiration risk.  He was recommended to use chin tuck position when swallowing thin liquids.  UPDATE 06/28/2019: He is here for follow-up visit with his wife.  His CKs continue to trend down 1432 and his feeling much stronger than he was previously.  He is able to stand up if he was crouching on the floor without any difficulty.  He can climb stairs without difficulty.  He has noticed improved arm strength and can raise his arms higher.  He is now going to outpatient physical therapy and doing very well.    UPDATE 08/10/2019: He is here for follow-up visit with his wife.  He has been on prednisone 30 mg for the past month and his most recent CK was down to 736.  He completed physical therapy and continues to feel stronger in his arms and legs.  He is walking much better and able to raise his legs higher than before.  He is slowly starting to feel back to his prior baseline.   Medications:  Current Outpatient Medications on File Prior to Visit  Medication Sig Dispense Refill   aspirin 81 MG chewable tablet Chew 1 tablet (81 mg total) by mouth at bedtime.     calcium-vitamin D (OSCAL WITH D) 500-200 MG-UNIT tablet Take 1 tablet by mouth 3 (three) times daily. 270 tablet 3   cyanocobalamin (,VITAMIN B-12,) 1000 MCG/ML injection Inject IM daily for 7 days, then weekly for 4 weeks, then monthly 10 mL 0   finasteride (PROSCAR) 5 MG tablet Take 5 mg by mouth at bedtime.      fluticasone (FLONASE) 50 MCG/ACT nasal spray Place 2 sprays into both nostrils daily as needed for allergies or rhinitis.     Immune Globulin 10% (IMMUNE GLOBULIN 10%) 10G/121m (10,0079m100mL) SOLN Inject 1 g/kg into the vein every 24 hours x 2 doses. 75 g every 21 days for 2 consecutive days      metFORMIN (GLUCOPHAGE) 500 MG tablet Take 1 tablet (500 mg total) by mouth 2 (two) times daily with a meal. 180 tablet 3   mycophenolate (CELLCEPT) 500 MG tablet Take 1 tablet (500 mg total) by mouth 2 (two) times daily. 60 tablet 3   nitroGLYCERIN (NITROSTAT) 0.4 MG SL tablet Place 1 tablet (0.4 mg total) under the tongue every 5 (five) minutes as needed for chest pain. 25 tablet 4   NON FORMULARY IVIG every 3 weeks for 2 days     pantoprazole (PROTONIX) 40 MG tablet Take 1 tablet (40 mg total) by mouth daily. 90 tablet 3   Polyvinyl Alcohol-Povidone (REFRESH OP) Place 1 drop into both eyes daily.     PRALUENT 150 MG/ML SOAJ Inject in to skin every 2 weeks Currently on hold     predniSONE (DELTASONE) 20  MG tablet Reduce to '70mg'$  (3.5 tab) for 2 weeks, then '60mg'$  daily. (Patient taking differently: 30 mg. ) 120 tablet 3   sulfamethoxazole-trimethoprim (BACTRIM DS) 800-160 MG tablet Take 1 tablet by mouth 3 (three) times a week. 40 tablet 3   Syringe/Needle, Disp, (SYRINGE 3CC/22GX1") 22G X 1" 3 ML MISC Use with B12 injections 50 each 1   tamsulosin (FLOMAX) 0.4 MG CAPS capsule Take 0.4 mg by mouth at bedtime.     No current facility-administered medications on file prior to visit.     Allergies:  Allergies  Allergen Reactions   Statins Other (See Comments)    Myositis Myositis    Review of Systems:  CONSTITUTIONAL: No fevers, chills, night sweats, or weight loss.  EYES: No visual changes or eye pain ENT: No hearing changes.  No history of nose bleeds.   RESPIRATORY: No cough, wheezing and shortness of breath.   CARDIOVASCULAR: Negative for chest pain, and palpitations.   GI: Negative for abdominal discomfort, blood in stools or black stools.  No recent change in bowel habits.   GU:  No history of incontinence.   MUSCLOSKELETAL: No history of joint pain or swelling.  No myalgias.   SKIN: Negative for lesions, rash, and itching.   ENDOCRINE: Negative for cold or heat  intolerance, polydipsia or goiter.   PSYCH:  No depression or anxiety symptoms.   NEURO: As Above.   Vital Signs:  BP 112/82    Pulse 75    Ht '5\' 9"'$  (1.753 m)    Wt 195 lb (88.5 kg)    SpO2 98%    BMI 28.80 kg/m   Neurological Exam: MENTAL STATUS including orientation to time, place, person, recent and remote memory, attention span and concentration, language, and fund of knowledge is normal.  Speech is sounds stronger.  CRANIAL NERVES:   Extra-ocular eye movements in all directions of gaze.  No ptosis.  Face is symmetric.   MOTOR:  Mild proximal leg atrophy, No fasciculations or abnormal movements.  No pronator drift.   Upper Extremity:  Right  Left  Deltoid  5/5   5/5   Biceps  5/5   5/5   Triceps * 5/5   5/5   Infraspinatus * 5-/5  5-/5  Medial pectoralis * 5-/5  5-/5  Wrist extensors  5-/5   5/5   Wrist flexors  5-/5   5/5   Finger extensors  4/5   5/5   Finger flexors  4/5   5/5   Dorsal interossei  4/5   5/5   Abductor pollicis  4/5   5/5   Tone (Ashworth scale)  0  0   Lower Extremity:  Right  Left  Hip flexors * 5-/5   5-/5   Hip extensors  5-/5   5-/5   Adductor 5-/5  5-/5  Abductor * 5-/5  5-/5  Knee flexors * 5/5   5/5   Knee extensors * 55   5/5   Dorsiflexors  5/5   5/5   Plantarflexors  5/5   5/5   Toe extensors  5/5   5/5   Toe flexors  5/5   5/5   Tone (Ashworth scale)  0  0   COORDINATION/GAIT:  He is able to stand up from chair without using arms to push off.  Gait appears improved with significantly less trendelenburg.   Data: MRI cervical spine without contrast 02/17/2019: 1. 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 2. Solid anterior fusion at C4-5  and C5-6 without spinal stenosis at these levels. 3. Adjacent segment degenerative changes with moderate spinal stenosis at C6-7, mild-to-moderate spinal stenosis at C3-4, and mild-to-moderate neural foraminal stenosis at both levels.  MRI lumbar spine 02/17/2019: 1. Relatively diffuse lumbar disc and facet  degeneration without significant spinal stenosis. 2. Multilevel neural foraminal stenosis, moderate on the left at L5-S1. 3. Normal appearance of the conus medullaris and cauda equina.  MRI thoracic spine without contrast 02/17/2019: 1. Mild thoracic disc and facet degeneration without evidence of neural impingement. 2. Minimal prominence of the central canal of the mid to lower thoracic spinal cord without syrinx.  MRI left femur with and without contrast 02/18/2019: 1. Abnormal diffuse muscular edema and enhancement involving various muscles of the lower pelvis and thigh as detailed above. The coronal images indicate that this is a grossly symmetric process. I note that there is a plan for quadriceps muscle biopsy; I would recommend targeting the rectus femoris muscle and referring to today's MRI images given that the other quadriceps muscles demonstrate only minimal involvement. 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  CT chest w contrast 02/17/2019: 1. No CT evidence of malignancy in the chest, abdomen, or pelvis. 2. Innumerable tiny centrilobular pulmonary nodules in an apically predominant distribution, which may reflect sequelae of atypical infection or smoking-related respiratory bronchiolitis. There is an unchanged, benign somewhat bandlike consolidation in the right lung base. 3. Coronary artery disease. 4. Atherosclerosis. 5. Prostatomegaly.  Labs 02/17/2019:  Aldolase 122, anti-Jo neg, ANA neg, anti-Scl 70 neg, CRP 1.1, ESR 12, Ehrlichia antibody neg, Lyme neg, acute hepatitis panel neg, HIV neg, VGCC neg, paraneoplastic antibody panel neg 02/15/2019:  vitamin B12 162*,  TSH 4.86*, free T4 0.78  Labs 08/2014 at [facility]:  CK 219, AST 61*, ALT 90*, alk phos 126*   Lab Results  Component Value Date   CKTOTAL 739 ([facility]) 08/03/2019   CKMB 10 ([facility]) 03/07/2019   TROPONINI 0.03 ([place]) 02/17/2019    Left rectus femorus muscle biopsy 02/20/2019:  Active necrotizing inflammatory myopathy. 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  Diagnosis was confirmed by antibody testing and supported by pathology findings.  He has been off statin therapy for ~1 year.  He was started on IVIG and Cellcept in July 2020 and started prednisone taper.  He is on prednisone 72m for the past month and doing great.  Patient was again made aware of the risk of worsening weakness as we continue to reduce his prednisone and was instructed to monitor for these symptoms.   PLAN/RECOMMENDATIONS:  1.  Continue IVIG 178mkg every 21 days 2.  Continue Cellcept 50066mID.   3.  Reduce prednisone to 66m4mily, if next CK is lower, plan to reduce to 15mg74mly 4.  Check CMP, CBC, and CK during the 1st week of every month 5.  Continue calcium, bactrim, and protonix  Return to clinic in 6 weeks  Greater than 50% of this 20 minute visit was spent in counseling, explanation of diagnosis, planning of further management, and coordination of care.  Thank you for allowing me to participate in patient's care.  If I can answer any additional questions, I would be pleased to do so.    Sincerely,    Marlo Goodrich K. Posey Pronto, DO

## 2019-08-12 ENCOUNTER — Other Ambulatory Visit: Payer: Self-pay | Admitting: Neurology

## 2019-08-16 ENCOUNTER — Encounter (HOSPITAL_COMMUNITY)
Admission: RE | Admit: 2019-08-16 | Discharge: 2019-08-16 | Disposition: A | Discharge status: Home / Self Care | Payer: Medicare Other | Source: Ambulatory Visit | Attending: Neurology | Admitting: Neurology

## 2019-08-16 ENCOUNTER — Encounter (HOSPITAL_COMMUNITY): Payer: Self-pay

## 2019-08-16 ENCOUNTER — Other Ambulatory Visit: Payer: Self-pay

## 2019-08-16 DIAGNOSIS — D8989 Other specified disorders involving the immune mechanism, not elsewhere classified: Secondary | ICD-10-CM | POA: Diagnosis not present

## 2019-08-16 DIAGNOSIS — G7249 Other inflammatory and immune myopathies, not elsewhere classified: Secondary | ICD-10-CM | POA: Insufficient documentation

## 2019-08-16 MED ORDER — DEXTROSE 5 % IV SOLN
INTRAVENOUS | Status: DC
  Administered 2019-08-16: 08:00:00 via INTRAVENOUS

## 2019-08-16 MED ORDER — DIPHENHYDRAMINE HCL 25 MG PO CAPS
25.0000 mg | ORAL_CAPSULE | ORAL | Status: DC
  Administered 2019-08-16: 25 mg via ORAL
  Filled 2019-08-16: qty 1

## 2019-08-16 MED ORDER — IMMUNE GLOBULIN (HUMAN) 10 GM/100ML IV SOLN
1.0000 g/kg | INTRAVENOUS | Status: DC
  Administered 2019-08-16: 90 g via INTRAVENOUS
  Filled 2019-08-16: qty 900

## 2019-08-16 MED ORDER — ACETAMINOPHEN 325 MG PO TABS
325.0000 mg | ORAL_TABLET | ORAL | Status: DC
  Administered 2019-08-16: 325 mg via ORAL
  Filled 2019-08-16: qty 1

## 2019-08-16 MED ORDER — IMMUNE GLOBULIN (HUMAN) 5 GM/50ML IV SOLN
1.0000 g/kg | INTRAVENOUS | Status: DC
  Filled 2019-08-16: qty 850

## 2019-08-17 ENCOUNTER — Encounter (HOSPITAL_COMMUNITY)
Admission: RE | Admit: 2019-08-17 | Discharge: 2019-08-17 | Disposition: A | Discharge status: Home / Self Care | Payer: Medicare Other | Source: Ambulatory Visit | Attending: Neurology | Admitting: Neurology

## 2019-08-17 DIAGNOSIS — G7249 Other inflammatory and immune myopathies, not elsewhere classified: Secondary | ICD-10-CM | POA: Diagnosis not present

## 2019-08-17 MED ORDER — DEXTROSE 5 % IV SOLN
INTRAVENOUS | Status: DC
  Administered 2019-08-17: 08:00:00 via INTRAVENOUS

## 2019-08-17 MED ORDER — IMMUNE GLOBULIN (HUMAN) 10 GM/100ML IV SOLN
1.0000 g/kg | INTRAVENOUS | Status: AC
  Administered 2019-08-17: 90 g via INTRAVENOUS
  Filled 2019-08-17: qty 800

## 2019-08-17 MED ORDER — ACETAMINOPHEN 325 MG PO TABS
325.0000 mg | ORAL_TABLET | ORAL | Status: DC
  Administered 2019-08-17: 325 mg via ORAL
  Filled 2019-08-17: qty 1

## 2019-08-17 MED ORDER — DIPHENHYDRAMINE HCL 25 MG PO CAPS
25.0000 mg | ORAL_CAPSULE | ORAL | Status: DC
  Administered 2019-08-17: 25 mg via ORAL
  Filled 2019-08-17: qty 1

## 2019-08-23 ENCOUNTER — Encounter: Payer: Self-pay | Admitting: Gastroenterology

## 2019-09-03 ENCOUNTER — Other Ambulatory Visit: Payer: Self-pay

## 2019-09-03 DIAGNOSIS — G7249 Other inflammatory and immune myopathies, not elsewhere classified: Secondary | ICD-10-CM

## 2019-09-10 ENCOUNTER — Encounter (HOSPITAL_COMMUNITY)
Admission: RE | Admit: 2019-09-10 | Discharge: 2019-09-10 | Disposition: A | Discharge status: Home / Self Care | Payer: Medicare Other | Source: Ambulatory Visit | Attending: Neurology | Admitting: Neurology

## 2019-09-10 ENCOUNTER — Other Ambulatory Visit: Payer: Self-pay | Admitting: Neurology

## 2019-09-10 ENCOUNTER — Other Ambulatory Visit: Payer: Self-pay

## 2019-09-10 ENCOUNTER — Encounter (HOSPITAL_COMMUNITY): Payer: Self-pay

## 2019-09-10 DIAGNOSIS — G7249 Other inflammatory and immune myopathies, not elsewhere classified: Secondary | ICD-10-CM | POA: Diagnosis not present

## 2019-09-10 MED ORDER — DEXTROSE 5 % IV SOLN
INTRAVENOUS | Status: DC
  Administered 2019-09-10: 08:00:00 via INTRAVENOUS

## 2019-09-10 MED ORDER — ACETAMINOPHEN 325 MG PO TABS
325.0000 mg | ORAL_TABLET | ORAL | Status: DC
  Administered 2019-09-10: 325 mg via ORAL
  Filled 2019-09-10: qty 1

## 2019-09-10 MED ORDER — IMMUNE GLOBULIN (HUMAN) 10 GM/100ML IV SOLN
1.0000 g/kg | INTRAVENOUS | Status: AC
  Administered 2019-09-10: 90 g via INTRAVENOUS
  Filled 2019-09-10: qty 100

## 2019-09-10 MED ORDER — DIPHENHYDRAMINE HCL 25 MG PO CAPS
25.0000 mg | ORAL_CAPSULE | ORAL | Status: DC
  Administered 2019-09-10: 25 mg via ORAL
  Filled 2019-09-10: qty 1

## 2019-09-11 ENCOUNTER — Encounter (HOSPITAL_COMMUNITY)
Admission: RE | Admit: 2019-09-11 | Discharge: 2019-09-11 | Disposition: A | Discharge status: Home / Self Care | Payer: Medicare Other | Source: Ambulatory Visit | Attending: Neurology | Admitting: Neurology

## 2019-09-11 ENCOUNTER — Other Ambulatory Visit: Payer: Medicare Other

## 2019-09-11 DIAGNOSIS — G7249 Other inflammatory and immune myopathies, not elsewhere classified: Secondary | ICD-10-CM | POA: Diagnosis present

## 2019-09-11 DIAGNOSIS — D8989 Other specified disorders involving the immune mechanism, not elsewhere classified: Secondary | ICD-10-CM | POA: Insufficient documentation

## 2019-09-11 DIAGNOSIS — E538 Deficiency of other specified B group vitamins: Secondary | ICD-10-CM

## 2019-09-11 DIAGNOSIS — R531 Weakness: Secondary | ICD-10-CM

## 2019-09-11 MED ORDER — ACETAMINOPHEN 325 MG PO TABS
325.0000 mg | ORAL_TABLET | ORAL | Status: AC
  Administered 2019-09-11: 325 mg via ORAL

## 2019-09-11 MED ORDER — DIPHENHYDRAMINE HCL 25 MG PO CAPS
25.0000 mg | ORAL_CAPSULE | ORAL | Status: AC
  Administered 2019-09-11: 25 mg via ORAL

## 2019-09-11 MED ORDER — IMMUNE GLOBULIN (HUMAN) 10 GM/100ML IV SOLN
1.0000 g/kg | INTRAVENOUS | Status: AC
  Administered 2019-09-11: 90 g via INTRAVENOUS
  Filled 2019-09-11: qty 100

## 2019-09-11 MED ORDER — DIPHENHYDRAMINE HCL 25 MG PO CAPS
ORAL_CAPSULE | ORAL | Status: AC
  Filled 2019-09-11: qty 1

## 2019-09-11 MED ORDER — DEXTROSE 5 % IV SOLN
INTRAVENOUS | Status: AC
  Administered 2019-09-11: 08:00:00 via INTRAVENOUS

## 2019-09-11 MED ORDER — ACETAMINOPHEN 325 MG PO TABS
ORAL_TABLET | ORAL | Status: AC
  Filled 2019-09-11: qty 1

## 2019-09-12 LAB — COMPREHENSIVE METABOLIC PANEL
ALT: 51 IU/L — ABNORMAL HIGH (ref 0–44)
AST: 31 IU/L (ref 0–40)
Albumin/Globulin Ratio: 0.5 — ABNORMAL LOW (ref 1.2–2.2)
Albumin: 3.5 g/dL — ABNORMAL LOW (ref 3.8–4.8)
Alkaline Phosphatase: 60 IU/L (ref 39–117)
BUN/Creatinine Ratio: 19 (ref 10–24)
BUN: 15 mg/dL (ref 8–27)
Bilirubin Total: <0.2 mg/dL (ref 0.0–1.2)
CO2: 25 mmol/L (ref 20–29)
Calcium: 9.9 mg/dL (ref 8.6–10.2)
Chloride: 97 mmol/L (ref 96–106)
Creatinine, Ser: 0.81 mg/dL (ref 0.76–1.27)
GFR calc Af Amer: 107 mL/min/{1.73_m2} (ref >59)
GFR calc non Af Amer: 93 mL/min/{1.73_m2} (ref >59)
Globulin, Total: 6.6 g/dL — ABNORMAL HIGH (ref 1.5–4.5)
Glucose: 148 mg/dL — ABNORMAL HIGH (ref 65–99)
Potassium: 4.8 mmol/L (ref 3.5–5.2)
Sodium: 131 mmol/L — ABNORMAL LOW (ref 134–144)
Total Protein: 10.1 g/dL (ref 6.0–8.5)

## 2019-09-12 LAB — CBC
Hematocrit: 39.5 % (ref 37.5–51.0)
Hemoglobin: 13.2 g/dL (ref 13.0–17.7)
MCH: 31.1 pg (ref 26.6–33.0)
MCHC: 33.4 g/dL (ref 31.5–35.7)
MCV: 93 fL (ref 79–97)
Platelets: 199 10*3/uL (ref 150–450)
RBC: 4.25 x10E6/uL (ref 4.14–5.80)
RDW: 13.4 % (ref 11.6–15.4)
WBC: 5.6 10*3/uL (ref 3.4–10.8)

## 2019-09-12 LAB — CK: Total CK: 209 U/L (ref 41–331)

## 2019-09-17 ENCOUNTER — Other Ambulatory Visit: Payer: Self-pay | Admitting: Cardiology

## 2019-09-17 ENCOUNTER — Other Ambulatory Visit: Payer: Self-pay

## 2019-09-17 ENCOUNTER — Ambulatory Visit (INDEPENDENT_AMBULATORY_CARE_PROVIDER_SITE_OTHER): Payer: Medicare Other | Admitting: *Deleted

## 2019-09-17 DIAGNOSIS — Z86010 Personal history of colonic polyps: Secondary | ICD-10-CM

## 2019-09-17 MED ORDER — NA SULFATE-K SULFATE-MG SULF 17.5-3.13-1.6 GM/177ML PO SOLN: 1.0000 | Freq: Once | ORAL | 0 refills | Status: AC

## 2019-09-17 NOTE — Progress Notes (Addendum)
Gastroenterology Pre-Procedure Review  Request Date: 09/17/2019 Requesting Physician: 5 year recall, Last TCS 04/05/2014 done by Dr. Oneida Alar, tubular adenoma  PATIENT REVIEW QUESTIONS: The patient responded to the following health history questions as indicated:    1. Diabetes Melitis: yes 2. Joint replacements in the past 12 months: no 3. Major health problems in the past 3 months: yes, IVIG infusions 4. Has an artificial valve or MVP: no 5. Has a defibrillator: no 6. Has been advised in past to take antibiotics in advance of a procedure like teeth cleaning: yes 7. Family history of colon cancer: yes, grandmother age: 50's  8. Alcohol Use: no 9. Illicit drug Use: no 10. History of sleep apnea: no  11. History of coronary artery or other vascular stents placed within the last 12 months: no 12. History of any prior anesthesia complications: no 13. There is no height or weight on file to calculate BMI. ht: 5'9 wt: 195 lbs    MEDICATIONS & ALLERGIES:    Patient reports the following regarding taking any blood thinners:   Plavix? no Aspirin? yes Coumadin? no Brilinta? no Xarelto? no Eliquis? no Pradaxa? no Savaysa? no Effient? no  Patient confirms/reports the following medications:  Current Outpatient Medications  Medication Sig Dispense Refill  . aspirin 81 MG chewable tablet Chew 1 tablet (81 mg total) by mouth at bedtime.    . calcium-vitamin D (OSCAL WITH D) 500-200 MG-UNIT tablet Take 1 tablet by mouth 3 (three) times daily. 270 tablet 3  . cyanocobalamin (,VITAMIN B-12,) 1000 MCG/ML injection Inject IM daily for 7 days, then weekly for 4 weeks, then monthly 10 mL 0  . finasteride (PROSCAR) 5 MG tablet Take 5 mg by mouth at bedtime.     . fluticasone (FLONASE) 50 MCG/ACT nasal spray Place 2 sprays into both nostrils daily as needed for allergies or rhinitis.    . Immune Globulin 10% (IMMUNE GLOBULIN 10%) 10G/111mL (10,000mg /154mL) SOLN Inject 1 g/kg into the vein every 24 hours  x 2 doses. 75 g every 21 days for 2 consecutive days    . metFORMIN (GLUCOPHAGE) 500 MG tablet Take 1 tablet (500 mg total) by mouth 2 (two) times daily with a meal. 180 tablet 3  . mycophenolate (CELLCEPT) 500 MG tablet Take 1 tablet by mouth twice daily 60 tablet 0  . nitroGLYCERIN (NITROSTAT) 0.4 MG SL tablet Place 1 tablet (0.4 mg total) under the tongue every 5 (five) minutes as needed for chest pain. 25 tablet 4  . NON FORMULARY IVIG every 3 weeks for 2 days    . pantoprazole (PROTONIX) 40 MG tablet Take 1 tablet (40 mg total) by mouth daily. 90 tablet 3  . Polyvinyl Alcohol-Povidone (REFRESH OP) Place 1 drop into both eyes daily.    Marland Kitchen PRALUENT 150 MG/ML SOAJ Inject in to skin every 2 weeks Currently on hold    . predniSONE (DELTASONE) 20 MG tablet Take 1 tablet (20 mg total) by mouth daily with breakfast. (Patient taking differently: Take 15 mg by mouth daily with breakfast. ) 90 tablet 3  . sulfamethoxazole-trimethoprim (BACTRIM DS) 800-160 MG tablet Take 1 tablet by mouth 3 (three) times a week. 40 tablet 3  . Syringe/Needle, Disp, (SYRINGE 3CC/22GX1") 22G X 1" 3 ML MISC Use with B12 injections 50 each 1  . tamsulosin (FLOMAX) 0.4 MG CAPS capsule Take 0.4 mg by mouth at bedtime.     No current facility-administered medications for this visit.     Patient confirms/reports the following allergies:  Allergies  Allergen Reactions  . Statins Other (See Comments)    Myositis Myositis    No orders of the defined types were placed in this encounter.   AUTHORIZATION INFORMATION Primary Insurance: BCBS Medicare,  [place] #:[id number],  Group #: [id number] Pre-Cert / Josem Kaufmann required: No, not required  SCHEDULE INFORMATION: Procedure has been scheduled as follows:  Date:01/25/2020, Time: 9:30 Location: [facility] with Dr. Oneida Alar  This Gastroenterology Pre-Precedure Review Form is being routed to the following provider(s): Walden Field, NP

## 2019-09-17 NOTE — Patient Instructions (Addendum)
Stanley Boyd  [birth year] MRN: [id number]     Procedure Date: 01/25/2020 Time to register: 8:30 am Place to register: [facility] Procedure Time: 9:30 am Scheduled provider: Dr. Oneida Alar    PREPARATION FOR COLONOSCOPY WITH SUPREP BOWEL PREP KIT  Note: Suprep Bowel Prep Kit is a split-dose (2day) regimen. Consumption of BOTH 6-ounce bottles is required for a complete prep.  Please notify us immediately if you are diabetic, take iron supplements, or if you are on Coumadin or any other blood thinners.  Please hold the following medications:  See letter                                                                                                                                                 1 DAY BEFORE PROCEDURE:  DATE: 01/24/2020  DAY: Thursday Continue clear liquids the entire day - NO SOLID FOOD.   Diabetic medications adjustments for today: See letter  At 6:00pm: Complete steps 1 through 4 below, using ONE (1) 6-ounce bottle, before going to bed. Step 1:  Pour ONE (1) 6-ounce bottle of SUPREP liquid into the mixing container.  Step 2:  Add cool drinking water to the 16 ounce line on the container and mix.  Note: Dilute the solution concentrate as directed prior to use. Step 3:  DRINK ALL the liquid in the container. Step 4:  You MUST drink an additional two (2) or more 16 ounce containers of water over the next one (1) hour.   Continue clear liquids.  DAY OF PROCEDURE:   DATE: 01/25/2020   DAY: Friday If you take medications for your heart, blood pressure, or breathing, you may take these medications.  Diabetic medications adjustments for today: See letter  5 hours before your procedure at : 4:30 am Step 1:  Pour ONE (1) 6-ounce bottle of SUPREP liquid into the mixing container.  Step 2:  Add cool drinking water to the 16 ounce line on the container and mix.  Note: Dilute the solution concentrate as directed prior to use. Step 3:  DRINK ALL the liquid in the  container. Step 4:  You MUST drink an additional two (2) or more 16 ounce containers of water over the next one (1) hour. You MUST complete the final glass of water at least 3 hours before your colonoscopy. Nothing by mouth past 6:30 am  You may take your morning medications with sip of water unless we have instructed otherwise.    Please see below for Dietary Information.  CLEAR LIQUIDS INCLUDE:  Water Jello (NOT red in color)   Ice Popsicles (NOT red in color)   Tea (sugar ok, no milk/cream) Powdered fruit flavored drinks  Coffee (sugar ok, no milk/cream) Gatorade/ Lemonade/ Kool-Aid  (NOT red in color)   Juice: apple, white grape, white cranberry Soft drinks  Clear bullion, consomme,  broth (fat free beef/chicken/vegetable)  Carbonated beverages (any kind)  Strained chicken noodle soup Hard Candy   Remember: Clear liquids are liquids that will allow you to see your fingers on the other side of a clear glass. Be sure liquids are NOT red in color, and not cloudy, but CLEAR.  DO NOT EAT OR DRINK ANY OF THE FOLLOWING:  Dairy products of any kind   Cranberry juice Tomato juice / V8 juice   Grapefruit juice Orange juice     Red grape juice  Do not eat any solid foods, including such foods as: cereal, oatmeal, yogurt, fruits, vegetables, creamed soups, eggs, bread, crackers, pureed foods in a blender, etc.   HELPFUL HINTS FOR DRINKING PREP SOLUTION:   Make sure prep is extremely cold. Mix and refrigerate the the morning of the prep. You may also put in the freezer.   You may try mixing some Crystal Light or Country Time Lemonade if you prefer. Mix in small amounts; add more if necessary.  Try drinking through a straw  Rinse mouth with water or a mouthwash between glasses, to remove after-taste.  Try sipping on a cold beverage /ice/ popsicles between glasses of prep.  Place a piece of sugar-free hard candy in mouth between glasses.  If you become nauseated, try consuming smaller  amounts, or stretch out the time between glasses. Stop for 30-60 minutes, then slowly start back drinking.     OTHER INSTRUCTIONS  You will need a responsible adult at least 66 years of age to accompany you and drive you home. This person must remain in the waiting room during your procedure. The hospital will cancel your procedure if you do not have a responsible adult with you.   1. Wear loose fitting clothing that is easily removed. 2. Leave jewelry and other valuables at home.  3. Remove all body piercing jewelry and leave at home. 4. Total time from sign-in until discharge is approximately 2-3 hours. 5. You should go home directly after your procedure and rest. You can resume normal activities the day after your procedure. 6. The day of your procedure you should not:  Drive  Make legal decisions  Operate machinery  Drink alcohol  Return to work   You may call the office (Dept: [phone]) before 5:00pm, or page the doctor on call [phone]) after 5:00pm, for further instructions, if necessary.   Insurance Information YOU WILL NEED TO CHECK WITH YOUR INSURANCE COMPANY FOR THE BENEFITS OF COVERAGE YOU HAVE FOR THIS PROCEDURE.  UNFORTUNATELY, NOT ALL INSURANCE COMPANIES HAVE BENEFITS TO COVER ALL OR PART OF THESE TYPES OF PROCEDURES.  IT IS YOUR RESPONSIBILITY TO CHECK YOUR BENEFITS, HOWEVER, WE WILL BE GLAD TO ASSIST YOU WITH ANY CODES YOUR INSURANCE COMPANY MAY NEED.    PLEASE NOTE THAT MOST INSURANCE COMPANIES WILL NOT COVER A SCREENING COLONOSCOPY FOR PEOPLE UNDER THE AGE OF 50  IF YOU HAVE BCBS INSURANCE, YOU MAY HAVE BENEFITS FOR A SCREENING COLONOSCOPY BUT IF POLYPS ARE FOUND THE DIAGNOSIS WILL CHANGE AND THEN YOU MAY HAVE A DEDUCTIBLE THAT WILL NEED TO BE MET. SO PLEASE MAKE SURE YOU CHECK YOUR BENEFITS FOR A SCREENING COLONOSCOPY AS WELL AS A DIAGNOSTIC COLONOSCOPY.

## 2019-09-17 NOTE — Progress Notes (Signed)
Pt aware that I will call him once Apr procedure schedules are available.

## 2019-09-18 ENCOUNTER — Other Ambulatory Visit: Payer: Self-pay

## 2019-09-18 DIAGNOSIS — I2510 Atherosclerotic heart disease of native coronary artery without angina pectoris: Secondary | ICD-10-CM

## 2019-09-18 MED ORDER — PRALUENT 150 MG/ML ~~LOC~~ SOAJ: 150.0000 mL | SUBCUTANEOUS | 0 refills | Status: DC

## 2019-09-19 NOTE — Progress Notes (Signed)
Ok to schedule.  DM meds: Hold Metformin day of procedure.  On prep day: Check CBG ac and hs as well (if they normally check their blood sugar) and if the patient feels like their blood sugar is off. Can use soda, juice (that's in [place]) as needed for any low blood sugar.  Check CBG on arrival to endo unit.

## 2019-09-20 ENCOUNTER — Encounter: Payer: Self-pay | Admitting: *Deleted

## 2019-09-20 NOTE — Progress Notes (Addendum)
Called pt and scheduled him for 01/25/2020.  Pt is aware that I will mail him out prep instructions and Covid screening information. Also, included letter with diabetes medication adjustments.

## 2019-09-20 NOTE — Addendum Note (Signed)
Addended by: Metro Kung on: 09/20/2019 04:01 PM   Modules accepted: Orders, SmartSet

## 2019-09-21 ENCOUNTER — Other Ambulatory Visit: Payer: Self-pay

## 2019-09-21 ENCOUNTER — Encounter: Payer: Self-pay | Admitting: Neurology

## 2019-09-21 ENCOUNTER — Ambulatory Visit (INDEPENDENT_AMBULATORY_CARE_PROVIDER_SITE_OTHER): Payer: Medicare Other | Admitting: Neurology

## 2019-09-21 VITALS — BP 130/63 | HR 68 | Resp 16 | Ht 69.0 in | Wt 198.4 lb

## 2019-09-21 DIAGNOSIS — G7249 Other inflammatory and immune myopathies, not elsewhere classified: Secondary | ICD-10-CM | POA: Diagnosis not present

## 2019-09-21 MED ORDER — PREDNISONE 10 MG PO TABS: 15.0000 mg | ORAL_TABLET | Freq: Every day | ORAL | 2 refills | Status: DC

## 2019-09-21 NOTE — Patient Instructions (Addendum)
Continue prednisone 15mg  daily Continue IVIG every 21 days I will contact [facility] to see if you can get infusion locally Check labs during the first week of January   Return to clinic in February

## 2019-09-21 NOTE — Progress Notes (Signed)
Follow-up Visit   Date: 09/21/19   Andyn Sales MRN: [id number] DOB: [birth year]   Interim History: Christyan Reger is a 68 y.o. 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  The patient was accompanied to the clinic by wife who also provides collateral information.    History of present illness: Early April 2020, he began having difficulty walking, especially raising his legs, such as when getting into a car.  This gradually progressed over the next 4 weeks to the point where he was unable to raise his leg to climb onto a step. Around the same time, he developed a skin rash of the chest and forearm.  He was evaluated by his PCP who noted 4-/5 muscle strength in the legs and [place] at [address].  He was hospitalized at [facility] from 5/8 - 02/20/2019 for evaluation and hydration.  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  MRI left femur was most notable with diffuse, symmetric muscle edema and enhancement in the proximal hip girdle muscles. [place] peaked at [address]. Left rectus femoris muscle biopsy and skin biopsy was performed.  He was emperically started on prednisone 25m due to high clinical suspicion for inflammatory myopathy, despite normal inflammatory/autoimmune markers (see below).  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  Skin biopsy showed benign lesion with fibrosis and mixed superficial and deep inflammation.  He has a history of CAD, followed by Dr. GEinar Gip  He was previously on statin therapy for ~ 2 years, but due to elevated liver enzymes, he was switched Praluent in 2019.  His LFTs have been elevated in the past,  [facility], AST 61*, ALT 90*, alk phos 126*.  He denies history of myalgias or cramps.    In late May, he established care with me and [facility] HMGCR [facility] (see below).    He started IVIG in July and Cellcept 5034mBID, which has allowed safe tapering of prednisone.    [facility], who agreed with my management plan.  As per their recommendation, swallow evaluation was ordered and shows mild aspiration risk.  He was recommended to use chin tuck position when swallowing thin liquids.  UPDATE 09/21/2019:  Over the last several months, he has been on tapering dose of prednisone and his last CK was normal at 209.  Therefore, prednisone was reduced to 1521maily.  He has been doing great on this dose. He denies any new weakness in the arms or legs.  He is back to doing his usual activities.  He continues to get IVIG every 3 weeks and Cellcept 500m88mD. He can tell when IVIG is wearing off because by the 3rd week, he tends to be slightly weaker.  There has been denial of coverage for IVIG due to documentation, which my office is working to sort through.  I appreciate Mrs. Vukelich's diligent efforts in providing us [place] details on this. They would like to explore options closer to home for infusion.     Medications:  Current Outpatient  Medications on File Prior to Visit  Medication Sig Dispense Refill  . Alirocumab (PRALUENT) 150 MG/ML SOAJ Inject 150 mLs into the muscle every 14 (fourteen) days. 2 mL 0  . aspirin 81 MG chewable tablet Chew 1 tablet (81 mg total) by mouth at bedtime.    . calcium-vitamin D (OSCAL WITH D) 500-200 MG-UNIT tablet Take 1 tablet by mouth 3 (three) times daily. 270 tablet 3  . cyanocobalamin (,VITAMIN B-12,) 1000 MCG/ML injection Inject IM daily for 7  days, then weekly for 4 weeks, then monthly 10 mL 0  . finasteride (PROSCAR) 5 MG tablet Take 5 mg by mouth at bedtime.     . fluticasone (FLONASE) 50 MCG/ACT nasal spray Place 2 sprays into both nostrils daily as needed for allergies or rhinitis.    . Immune Globulin 10% (IMMUNE GLOBULIN 10%) 10G/135m (10,0036m100mL) SOLN Inject 1 g/kg into the vein every 24 hours x 2 doses. 75 g every 21 days for 2 consecutive days    . metFORMIN (GLUCOPHAGE) 500 MG tablet Take 1 tablet (500 mg total) by mouth 2 (two) times daily with a meal. 180 tablet 3  . mycophenolate (CELLCEPT) 500 MG tablet Take 1 tablet by mouth twice daily 60 tablet 0  . nitroGLYCERIN (NITROSTAT) 0.4 MG SL tablet Place 1 tablet (0.4 mg total) under the tongue every 5 (five) minutes as needed for chest pain. 25 tablet 4  . NON FORMULARY IVIG every 3 weeks for 2 days    . pantoprazole (PROTONIX) 40 MG tablet Take 1 tablet (40 mg total) by mouth daily. 90 tablet 3  . Polyvinyl Alcohol-Povidone (REFRESH OP) Place 1 drop into both eyes daily.    . predniSONE (DELTASONE) 20 MG tablet Take 1 tablet (20 mg total) by mouth daily with breakfast. (Patient taking differently: Take 15 mg by mouth daily with breakfast. ) 90 tablet 3  . sulfamethoxazole-trimethoprim (BACTRIM DS) 800-160 MG tablet Take 1 tablet by mouth 3 (three) times a week. 40 tablet 3  . Syringe/Needle, Disp, (SYRINGE 3CC/22GX1") 22G X 1" 3 ML MISC Use with B12 injections 50 each 1  . tamsulosin (FLOMAX) 0.4 MG CAPS capsule Take 0.4 mg by mouth at bedtime.     No current facility-administered medications on file prior to visit.    Allergies:  Allergies  Allergen Reactions  . Statins Other (See Comments)    Myositis Myositis    Review of Systems:  CONSTITUTIONAL: No fevers, chills, night sweats, or weight loss.  EYES: No visual changes or eye pain ENT: No hearing changes.  No history of nose bleeds.   RESPIRATORY: No cough, wheezing and shortness of breath.     CARDIOVASCULAR: Negative for chest pain, and palpitations.   GI: Negative for abdominal discomfort, blood in stools or black stools.  No recent change in bowel habits.   GU:  No history of incontinence.   MUSCLOSKELETAL: No history of joint pain or swelling.  No myalgias.   SKIN: Negative for lesions, rash, and itching.   ENDOCRINE: Negative for cold or heat intolerance, polydipsia or goiter.   PSYCH:  No depression or anxiety symptoms.   NEURO: As Above.   Vital Signs:  BP 130/63 (BP Location: Left Arm, Patient Position: Sitting)   Pulse 68   Resp 16   Ht _0  (1.753 m)   Wt 198 lb 6.4 oz (90 kg)   SpO2 96%   BMI 29.30 kg/m   Neurological Exam: MENTAL STATUS including orientation to time, place, person,  recent and remote memory, attention span and concentration, language, and fund of knowledge is normal.  Speech is sounds strong, no dysarthria.  CRANIAL NERVES:   Extra-ocular eye movements in all directions of gaze.  No ptosis.  Face is symmetric.   MOTOR:  Mild proximal leg atrophy, No fasciculations or abnormal movements.  No pronator drift.   Upper Extremity:  Right  Left  Deltoid  5/5   5/5   Biceps  5/5   5/5   Triceps  5/5   5/5   Wrist extensors  5-/5   5/5   Wrist flexors  5-/5   5/5   Finger extensors  4/5   5/5   Finger flexors  4/5   5/5   Dorsal interossei  4/5   5/5   Abductor pollicis  4/5   5/5   Tone (Ashworth scale)  0  0   Lower Extremity:  Right  Left  Hip flexors * 5/5   5/5   Hip extensors  5/5   5/5   Adductor 5/5  5/5  Abductor * 5/5  5/5  Knee flexors * 5/5   5/5   Knee extensors * 55   5/5   Dorsiflexors  5/5   5/5   Plantarflexors  5/5   5/5   Toe extensors  5/5   5/5   Toe flexors  5/5   5/5   Tone (Ashworth scale)  0  0   COORDINATION/GAIT:  He is able to stand up from chair without using arms to push off. He can easily perform knee squat and stand without difficulty.  Gait appears improved with less trendelenburg.   Data: MRI  cervical spine without contrast 02/17/2019: 1. 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 2. Solid anterior fusion at C4-5 and C5-6 without spinal stenosis at these levels. 3. Adjacent segment degenerative changes with moderate spinal stenosis at C6-7, mild-to-moderate spinal stenosis at C3-4, and mild-to-moderate neural foraminal stenosis at both levels.  MRI lumbar spine 02/17/2019: 1. Relatively diffuse lumbar disc and facet degeneration without significant spinal stenosis. 2. Multilevel neural foraminal stenosis, moderate on the left at L5-S1. 3. Normal appearance of the conus medullaris and cauda equina.  MRI thoracic spine without contrast 02/17/2019: 1. Mild thoracic disc and facet degeneration without evidence of neural impingement. 2. Minimal prominence of the central canal of the mid to lower thoracic spinal cord without syrinx.  MRI left femur with and without contrast 02/18/2019: 1. Abnormal diffuse muscular edema and enhancement involving various muscles of the lower pelvis and thigh as detailed above. The coronal images indicate that this is a grossly symmetric process. I note that there is a plan for quadriceps muscle biopsy; I would recommend targeting the rectus femoris muscle and referring to today's MRI images given that the other quadriceps muscles demonstrate only minimal involvement. 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  CT chest w contrast 02/17/2019: 1. No CT evidence of malignancy in the chest, abdomen, or pelvis. 2. Innumerable tiny centrilobular pulmonary nodules in an apically predominant distribution, which may reflect sequelae of atypical infection or smoking-related  respiratory bronchiolitis. There is an unchanged, benign somewhat bandlike consolidation in the right lung base. 3. Coronary artery disease. 4. Atherosclerosis. 5. Prostatomegaly.  Labs 02/17/2019:  Aldolase  122, anti-Jo neg, ANA neg, anti-Scl 70 neg, CRP 1.1, ESR 12, Ehrlichia antibody neg, Lyme neg, acute hepatitis panel neg, HIV neg, VGCC neg, paraneoplastic antibody panel neg 02/15/2019:  vitamin B12 162*, TSH 4.86*, free T4 0.78  Labs 08/2014 at [facility]:  CK 219, AST 61*, ALT 90*, alk phos 126*   Lab Results  Component Value Date   CKTOTAL 209 09/11/2019   CKMB 10 (H) 03/07/2019   TROPONINI 0.03 ([place]) 02/17/2019    Left rectus femorus muscle biopsy 02/20/2019:  Active necrotizing inflammatory myopathy. 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  Diagnosis was confirmed by antibody testing and supported by pathology findings.  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  I am very pleased with his clinical response.  At this time, I will keep him on his current medication plan as noted below.  He does report mild weakness towards the end of his 3rd week of IVIG, and therefore will hold off on reducing the frequency of IVIG at this time.  PLAN/RECOMMENDATIONS:  1.  Continue IVIG 142mkg every 21 days.  My [place] will [place] [facility] [place] [phone] see if we can transition infusion therapy locally.   2.  Continue Cellcept 50092mID.   3.  Continue prednisone 22m28maily 4.  Check CMP, CBC, and CK during the 1st week of every month 5.  Continue calcium, bactrim, and protonix  Return to clinic in 6 weeks  Greater than 50% of this 20 minute visit was spent in counseling, explanation of diagnosis, planning of further management, and coordination of care.   Thank you for allowing me to participate in patient's care.  If I can answer any additional questions, I would be pleased to do so.    Sincerely,    Dayle Sherpa K. PatePosey Pronto

## 2019-10-01 ENCOUNTER — Other Ambulatory Visit: Payer: Self-pay

## 2019-10-01 ENCOUNTER — Encounter (HOSPITAL_COMMUNITY)
Admission: RE | Admit: 2019-10-01 | Discharge: 2019-10-01 | Disposition: A | Discharge status: Home / Self Care | Payer: Medicare Other | Source: Ambulatory Visit | Attending: Neurology | Admitting: Neurology

## 2019-10-01 DIAGNOSIS — G7249 Other inflammatory and immune myopathies, not elsewhere classified: Secondary | ICD-10-CM | POA: Diagnosis not present

## 2019-10-01 MED ORDER — DEXTROSE 5 % IV SOLN: INTRAVENOUS | Status: DC

## 2019-10-01 MED ORDER — ACETAMINOPHEN 325 MG PO TABS
325.0000 mg | ORAL_TABLET | ORAL | Status: DC
  Administered 2019-10-01: 325 mg via ORAL
  Filled 2019-10-01: qty 1

## 2019-10-01 MED ORDER — IMMUNE GLOBULIN (HUMAN) 10 GM/100ML IV SOLN
1.0000 g/kg | INTRAVENOUS | Status: DC
  Administered 2019-10-01: 90 g via INTRAVENOUS
  Filled 2019-10-01: qty 800

## 2019-10-01 MED ORDER — DIPHENHYDRAMINE HCL 25 MG PO CAPS
25.0000 mg | ORAL_CAPSULE | ORAL | Status: DC
  Administered 2019-10-01: 25 mg via ORAL
  Filled 2019-10-01: qty 1

## 2019-10-01 NOTE — Discharge Instructions (Signed)
Immune Globulin Injection What is this medicine? IMMUNE GLOBULIN (im MUNE GLOB yoo lin) helps to prevent or reduce the severity of certain infections in patients who are at risk. This medicine is collected from the pooled blood of many donors. It is used to treat immune system problems, thrombocytopenia, and Kawasaki syndrome. This medicine may be used for other purposes; ask your health care provider or pharmacist if you have questions. COMMON BRAND NAME(S): ASCENIV, Baygam, BIVIGAM, Carimune, Carimune NF, cutaquig, Cuvitru, Flebogamma, Flebogamma DIF, GamaSTAN, GamaSTAN S/D, Gamimune N, Gammagard, Gammagard S/D, Gammaked, Gammaplex, Gammar-P IV, Gamunex, Gamunex-C, Hizentra, Iveegam, Iveegam EN, Octagam, Panglobulin, Panglobulin NF, panzyga, Polygam S/D, Privigen, Sandoglobulin, Venoglobulin-S, Vigam, Vivaglobulin, Xembify What should I tell my health care provider before I take this medicine? They need to know if you have any of these conditions:   diabetes   extremely low or no immune antibodies in the blood   heart disease   history of blood clots   hyperprolinemia   infection in the blood, sepsis   kidney disease   taking medicine that may change kidney function - ask your health care provider about your medicine   an unusual or allergic reaction to human immune globulin, albumin, maltose, sucrose, polysorbate 80, other medicines, foods, dyes, or preservatives   pregnant or trying to get pregnant   breast-feeding How should I use this medicine? This medicine is for injection into a muscle or infusion into a vein or skin. It is usually given by a health care professional in a hospital or clinic setting. In rare cases, some brands of this medicine might be given at home. You will be taught how to give this medicine. Use exactly as directed. Take your medicine at regular intervals. Do not take your medicine more often than directed. Talk to your pediatrician regarding  the use of this medicine in children. Special care may be needed. Overdosage: If you think you have taken too much of this medicine contact a poison control center or emergency room at once. NOTE: This medicine is only for you. Do not share this medicine with others. What if I miss a dose? It is important not to miss your dose. Call your doctor or health care professional if you are unable to keep an appointment. If you give yourself the medicine and you miss a dose, take it as soon as you can. If it is almost time for your next dose, take only that dose. Do not take double or extra doses. What may interact with this medicine?  aspirin and aspirin-like medicines  cisplatin  cyclosporine  medicines for infection like acyclovir, adefovir, amphotericin B, bacitracin, cidofovir, foscarnet, ganciclovir, gentamicin, pentamidine, vancomycin  NSAIDS, medicines for pain and inflammation, like ibuprofen or naproxen  pamidronate  vaccines  zoledronic acid This list may not describe all possible interactions. Give your health care provider a list of all the medicines, herbs, non-prescription drugs, or dietary supplements you use. Also tell them if you smoke, drink alcohol, or use illegal drugs. Some items may interact with your medicine. What should I watch for while using this medicine? Your condition will be monitored carefully while you are receiving this medicine. This medicine is made from pooled blood donations of many different people. It may be possible to pass an infection in this medicine. However, the donors are screened for infections and all products are tested for HIV and hepatitis. The medicine is treated to kill most or all bacteria and viruses. Talk to your doctor about   the risks and benefits of this medicine. Do not have vaccinations for at least 14 days before, or until at least 3 months after receiving this medicine. What side effects may I notice from receiving this  medicine? Side effects that you should report to your doctor or health care professional as soon as possible:  allergic reactions like skin rash, itching or hives, swelling of the face, lips, or tongue  breathing problems  chest pain or tightness  fever, chills  headache with nausea, vomiting  neck pain or difficulty moving neck  pain when moving eyes  pain, swelling, warmth in the leg  problems with balance, talking, walking  sudden weight gain  swelling of the ankles, feet, hands  trouble passing urine or change in the amount of urine Side effects that usually do not require medical attention (report to your doctor or health care professional if they continue or are bothersome):  dizzy, drowsy  flushing  increased sweating  leg cramps  muscle aches and pains  pain at site where injected This list may not describe all possible side effects. Call your doctor for medical advice about side effects. You may report side effects to FDA at 1-800-FDA-1088. Where should I keep my medicine? Keep out of the reach of children. This drug is usually given in a hospital or clinic and will not be stored at home. In rare cases, some brands of this medicine may be given at home. If you are using this medicine at home, you will be instructed on how to store this medicine. Throw away any unused medicine after the expiration date on the label. NOTE: This sheet is a summary. It may not cover all possible information. If you have questions about this medicine, talk to your doctor, pharmacist, or health care provider.  2020 Elsevier/Gold Standard (2008-12-18 11:44:49)  

## 2019-10-02 ENCOUNTER — Encounter (HOSPITAL_COMMUNITY)
Admission: RE | Admit: 2019-10-02 | Discharge: 2019-10-02 | Disposition: A | Discharge status: Home / Self Care | Payer: Medicare Other | Source: Ambulatory Visit | Attending: Neurology | Admitting: Neurology

## 2019-10-02 ENCOUNTER — Encounter (HOSPITAL_COMMUNITY): Payer: Self-pay

## 2019-10-02 DIAGNOSIS — G7249 Other inflammatory and immune myopathies, not elsewhere classified: Secondary | ICD-10-CM | POA: Diagnosis not present

## 2019-10-02 MED ORDER — DEXTROSE 5 % IV SOLN: INTRAVENOUS | Status: DC

## 2019-10-02 MED ORDER — IMMUNE GLOBULIN (HUMAN) 10 GM/100ML IV SOLN
1.0000 g/kg | INTRAVENOUS | Status: DC
  Administered 2019-10-02: 90 g via INTRAVENOUS
  Filled 2019-10-02: qty 100

## 2019-10-02 MED ORDER — DIPHENHYDRAMINE HCL 25 MG PO CAPS
25.0000 mg | ORAL_CAPSULE | ORAL | Status: DC
  Administered 2019-10-02: 25 mg via ORAL
  Filled 2019-10-02: qty 1

## 2019-10-02 MED ORDER — ACETAMINOPHEN 325 MG PO TABS
325.0000 mg | ORAL_TABLET | ORAL | Status: DC
  Administered 2019-10-02: 325 mg via ORAL
  Filled 2019-10-02: qty 1

## 2019-10-08 ENCOUNTER — Other Ambulatory Visit: Payer: Self-pay | Admitting: Neurology

## 2019-10-08 ENCOUNTER — Encounter: Payer: Self-pay | Admitting: Neurology

## 2019-10-19 LAB — CBC
Hematocrit: 46.6 % (ref 37.5–51.0)
Hemoglobin: 15.3 g/dL (ref 13.0–17.7)
MCH: 30.2 pg (ref 26.6–33.0)
MCHC: 32.8 g/dL (ref 31.5–35.7)
MCV: 92 fL (ref 79–97)
Platelets: 158 10*3/uL (ref 150–450)
RBC: 5.06 x10E6/uL (ref 4.14–5.80)
RDW: 13.6 % (ref 11.6–15.4)
WBC: 4.1 10*3/uL (ref 3.4–10.8)

## 2019-10-19 LAB — COMPREHENSIVE METABOLIC PANEL
ALT: 63 IU/L — ABNORMAL HIGH (ref 0–44)
AST: 49 IU/L — ABNORMAL HIGH (ref 0–40)
Albumin/Globulin Ratio: 0.9 — ABNORMAL LOW (ref 1.2–2.2)
Albumin: 3.8 g/dL (ref 3.8–4.8)
Alkaline Phosphatase: 78 IU/L (ref 39–117)
BUN/Creatinine Ratio: 14 (ref 10–24)
BUN: 14 mg/dL (ref 8–27)
Bilirubin Total: 0.3 mg/dL (ref 0.0–1.2)
CO2: 25 mmol/L (ref 20–29)
Calcium: 10.2 mg/dL (ref 8.6–10.2)
Chloride: 92 mmol/L — ABNORMAL LOW (ref 96–106)
Creatinine, Ser: 0.99 mg/dL (ref 0.76–1.27)
GFR calc Af Amer: 91 mL/min/{1.73_m2} (ref >59)
GFR calc non Af Amer: 79 mL/min/{1.73_m2} (ref >59)
Globulin, Total: 4.4 g/dL (ref 1.5–4.5)
Glucose: 126 mg/dL — ABNORMAL HIGH (ref 65–99)
Potassium: 4.9 mmol/L (ref 3.5–5.2)
Sodium: 133 mmol/L — ABNORMAL LOW (ref 134–144)
Total Protein: 8.2 g/dL (ref 6.0–8.5)

## 2019-10-19 LAB — CK: Total CK: 267 U/L (ref 41–331)

## 2019-10-24 ENCOUNTER — Other Ambulatory Visit: Payer: Self-pay

## 2019-10-24 ENCOUNTER — Encounter (HOSPITAL_COMMUNITY): Payer: Self-pay

## 2019-10-24 ENCOUNTER — Encounter (HOSPITAL_COMMUNITY)
Admission: RE | Admit: 2019-10-24 | Discharge: 2019-10-24 | Disposition: A | Discharge status: Home / Self Care | Payer: Medicare Other | Source: Ambulatory Visit | Attending: Neurology | Admitting: Neurology

## 2019-10-24 DIAGNOSIS — D8989 Other specified disorders involving the immune mechanism, not elsewhere classified: Secondary | ICD-10-CM | POA: Insufficient documentation

## 2019-10-24 DIAGNOSIS — G7249 Other inflammatory and immune myopathies, not elsewhere classified: Secondary | ICD-10-CM | POA: Diagnosis present

## 2019-10-24 MED ORDER — DIPHENHYDRAMINE HCL 25 MG PO CAPS
25.0000 mg | ORAL_CAPSULE | ORAL | Status: DC
  Administered 2019-10-24: 25 mg via ORAL
  Filled 2019-10-24: qty 1

## 2019-10-24 MED ORDER — DEXTROSE 5 % IV SOLN: INTRAVENOUS | Status: DC

## 2019-10-24 MED ORDER — IMMUNE GLOBULIN (HUMAN) 10 GM/100ML IV SOLN
90.0000 g | INTRAVENOUS | Status: DC
  Administered 2019-10-24: 09:00:00 90 g via INTRAVENOUS
  Filled 2019-10-24: qty 100

## 2019-10-24 MED ORDER — ACETAMINOPHEN 325 MG PO TABS
325.0000 mg | ORAL_TABLET | ORAL | Status: DC
  Administered 2019-10-24: 09:00:00 325 mg via ORAL
  Filled 2019-10-24: qty 1

## 2019-10-25 ENCOUNTER — Ambulatory Visit (HOSPITAL_COMMUNITY)
Admission: RE | Admit: 2019-10-25 | Discharge: 2019-10-25 | Disposition: A | Discharge status: Home / Self Care | Payer: Medicare Other | Source: Ambulatory Visit | Attending: Neurology | Admitting: Neurology

## 2019-10-25 ENCOUNTER — Encounter (HOSPITAL_COMMUNITY): Payer: Self-pay

## 2019-10-25 DIAGNOSIS — G7249 Other inflammatory and immune myopathies, not elsewhere classified: Secondary | ICD-10-CM | POA: Insufficient documentation

## 2019-10-25 MED ORDER — DIPHENHYDRAMINE HCL 25 MG PO CAPS
ORAL_CAPSULE | ORAL | Status: AC
  Administered 2019-10-25: 25 mg via ORAL
  Filled 2019-10-25: qty 1

## 2019-10-25 MED ORDER — ACETAMINOPHEN 325 MG PO TABS
ORAL_TABLET | ORAL | Status: AC
  Administered 2019-10-25: 325 mg via ORAL
  Filled 2019-10-25: qty 1

## 2019-10-25 MED ORDER — ACETAMINOPHEN 325 MG PO TABS: 325.0000 mg | ORAL_TABLET | ORAL | Status: DC

## 2019-10-25 MED ORDER — IMMUNE GLOBULIN (HUMAN) 10 GM/100ML IV SOLN
90.0000 g | INTRAVENOUS | Status: DC
  Administered 2019-10-25: 09:00:00 90 g via INTRAVENOUS
  Filled 2019-10-25: qty 100

## 2019-10-25 MED ORDER — DIPHENHYDRAMINE HCL 25 MG PO CAPS: 25.0000 mg | ORAL_CAPSULE | ORAL | Status: DC

## 2019-10-25 MED ORDER — DEXTROSE 5 % IV SOLN: INTRAVENOUS | Status: DC

## 2019-10-26 ENCOUNTER — Encounter: Payer: Self-pay | Admitting: Neurology

## 2019-10-30 ENCOUNTER — Telehealth: Payer: Self-pay

## 2019-10-30 NOTE — Telephone Encounter (Signed)
Letter to request appeal for IV IG therapy Gammagard faxed to [phone] today per Dr. Posey Pronto.

## 2019-11-04 ENCOUNTER — Other Ambulatory Visit: Payer: Self-pay | Admitting: Neurology

## 2019-11-04 ENCOUNTER — Other Ambulatory Visit: Payer: Self-pay | Admitting: Cardiology

## 2019-11-04 DIAGNOSIS — I2510 Atherosclerotic heart disease of native coronary artery without angina pectoris: Secondary | ICD-10-CM

## 2019-11-09 ENCOUNTER — Ambulatory Visit: Payer: Medicare Other

## 2019-11-10 ENCOUNTER — Encounter: Payer: Self-pay | Admitting: Family Medicine

## 2019-11-14 ENCOUNTER — Encounter (HOSPITAL_COMMUNITY)
Admission: RE | Admit: 2019-11-14 | Discharge: 2019-11-14 | Disposition: A | Discharge status: Home / Self Care | Payer: Medicare Other | Source: Ambulatory Visit | Attending: Neurology | Admitting: Neurology

## 2019-11-14 ENCOUNTER — Other Ambulatory Visit: Payer: Self-pay

## 2019-11-14 ENCOUNTER — Encounter (HOSPITAL_COMMUNITY): Payer: Self-pay

## 2019-11-14 DIAGNOSIS — G7249 Other inflammatory and immune myopathies, not elsewhere classified: Secondary | ICD-10-CM | POA: Insufficient documentation

## 2019-11-14 DIAGNOSIS — D8989 Other specified disorders involving the immune mechanism, not elsewhere classified: Secondary | ICD-10-CM | POA: Diagnosis not present

## 2019-11-14 MED ORDER — DEXTROSE 5 % IV SOLN: INTRAVENOUS | Status: DC

## 2019-11-14 MED ORDER — IMMUNE GLOBULIN (HUMAN) 10 GM/100ML IV SOLN
90.0000 g | INTRAVENOUS | Status: DC
  Administered 2019-11-14: 09:00:00 90 g via INTRAVENOUS
  Filled 2019-11-14: qty 800

## 2019-11-14 MED ORDER — DIPHENHYDRAMINE HCL 25 MG PO CAPS
25.0000 mg | ORAL_CAPSULE | ORAL | Status: DC
  Administered 2019-11-14: 08:00:00 25 mg via ORAL
  Filled 2019-11-14: qty 1

## 2019-11-14 MED ORDER — ACETAMINOPHEN 325 MG PO TABS
325.0000 mg | ORAL_TABLET | ORAL | Status: DC
  Administered 2019-11-14: 325 mg via ORAL
  Filled 2019-11-14: qty 1

## 2019-11-14 NOTE — Discharge Instructions (Signed)
Immune Globulin Injection What is this medicine? IMMUNE GLOBULIN (im MUNE GLOB yoo lin) helps to prevent or reduce the severity of certain infections in patients who are at risk. This medicine is collected from the pooled blood of many donors. It is used to treat immune system problems, thrombocytopenia, and Kawasaki syndrome. This medicine may be used for other purposes; ask your health care provider or pharmacist if you have questions. COMMON BRAND NAME(S): ASCENIV, Baygam, BIVIGAM, Carimune, Carimune NF, cutaquig, Cuvitru, Flebogamma, Flebogamma DIF, GamaSTAN, GamaSTAN S/D, Gamimune N, Gammagard, Gammagard S/D, Gammaked, Gammaplex, Gammar-P IV, Gamunex, Gamunex-C, Hizentra, Iveegam, Iveegam EN, Octagam, Panglobulin, Panglobulin NF, panzyga, Polygam S/D, Privigen, Sandoglobulin, Venoglobulin-S, Vigam, Vivaglobulin, Xembify What should I tell my health care provider before I take this medicine? They need to know if you have any of these conditions:  diabetes  extremely low or no immune antibodies in the blood  heart disease  history of blood clots  hyperprolinemia  infection in the blood, sepsis  kidney disease  recently received or scheduled to receive a vaccination  an unusual or allergic reaction to human immune globulin, albumin, maltose, sucrose, other medicines, foods, dyes, or preservatives  pregnant or trying to get pregnant  breast-feeding How should I use this medicine? This medicine is for injection into a muscle or infusion into a vein or skin. It is usually given by a health care professional in a hospital or clinic setting. In rare cases, some brands of this medicine might be given at home. You will be taught how to give this medicine. Use exactly as directed. Take your medicine at regular intervals. Do not take your medicine more often than directed. Talk to your pediatrician regarding the use of this medicine in children. While this drug may be prescribed for selected  conditions, precautions do apply. Overdosage: If you think you have taken too much of this medicine contact a poison control center or emergency room at once. NOTE: This medicine is only for you. Do not share this medicine with others. What if I miss a dose? It is important not to miss your dose. Call your doctor or health care professional if you are unable to keep an appointment. If you give yourself the medicine and you miss a dose, take it as soon as you can. If it is almost time for your next dose, take only that dose. Do not take double or extra doses. What may interact with this medicine?  aspirin and aspirin-like medicines  cisplatin  cyclosporine  medicines for infection like acyclovir, adefovir, amphotericin B, bacitracin, cidofovir, foscarnet, ganciclovir, gentamicin, pentamidine, vancomycin  NSAIDS, medicines for pain and inflammation, like ibuprofen or naproxen  pamidronate  vaccines  zoledronic acid This list may not describe all possible interactions. Give your health care provider a list of all the medicines, herbs, non-prescription drugs, or dietary supplements you use. Also tell them if you smoke, drink alcohol, or use illegal drugs. Some items may interact with your medicine. What should I watch for while using this medicine? Your condition will be monitored carefully while you are receiving this medicine. This medicine is made from pooled blood donations of many different people. It may be possible to pass an infection in this medicine. However, the donors are screened for infections and all products are tested for HIV and hepatitis. The medicine is treated to kill most or all bacteria and viruses. Talk to your doctor about the risks and benefits of this medicine. Do not have vaccinations for at least   14 days before, or until at least 3 months after receiving this medicine. What side effects may I notice from receiving this medicine? Side effects that you should report  to your doctor or health care professional as soon as possible:  allergic reactions like skin rash, itching or hives, swelling of the face, lips, or tongue  blue colored lips or skin  breathing problems  chest pain or tightness  fever  signs and symptoms of aseptic meningitis such as stiff neck; sensitivity to light; headache; drowsiness; fever; nausea; vomiting; rash  signs and symptoms of a blood clot such as chest pain; shortness of breath; pain, swelling, or warmth in the leg  signs and symptoms of hemolytic anemia such as fast heartbeat; tiredness; dark yellow or brown urine; or yellowing of the eyes or skin  signs and symptoms of kidney injury like trouble passing urine or change in the amount of urine  sudden weight gain  swelling of the ankles, feet, hands Side effects that usually do not require medical attention (report to your doctor or health care professional if they continue or are bothersome):  diarrhea  flushing  headache  increased sweating  joint pain  muscle cramps  muscle pain  nausea  pain, redness, or irritation at site where injected  tiredness This list may not describe all possible side effects. Call your doctor for medical advice about side effects. You may report side effects to FDA at 1-800-FDA-1088. Where should I keep my medicine? Keep out of the reach of children. This drug is usually given in a hospital or clinic and will not be stored at home. In rare cases, some brands of this medicine may be given at home. If you are using this medicine at home, you will be instructed on how to store this medicine. Throw away any unused medicine after the expiration date on the label. NOTE: This sheet is a summary. It may not cover all possible information. If you have questions about this medicine, talk to your doctor, pharmacist, or health care provider.  2020 Elsevier/Gold Standard (2019-05-02 12:51:14)  

## 2019-11-15 ENCOUNTER — Encounter (HOSPITAL_COMMUNITY)
Admission: RE | Admit: 2019-11-15 | Discharge: 2019-11-15 | Disposition: A | Discharge status: Home / Self Care | Payer: Medicare Other | Source: Ambulatory Visit | Attending: Neurology | Admitting: Neurology

## 2019-11-15 ENCOUNTER — Encounter (HOSPITAL_COMMUNITY): Payer: Self-pay

## 2019-11-15 DIAGNOSIS — G7249 Other inflammatory and immune myopathies, not elsewhere classified: Secondary | ICD-10-CM | POA: Diagnosis not present

## 2019-11-15 MED ORDER — DIPHENHYDRAMINE HCL 25 MG PO CAPS
ORAL_CAPSULE | ORAL | Status: AC
  Filled 2019-11-15: qty 1

## 2019-11-15 MED ORDER — DIPHENHYDRAMINE HCL 25 MG PO CAPS
25.0000 mg | ORAL_CAPSULE | ORAL | Status: DC
  Administered 2019-11-15: 25 mg via ORAL

## 2019-11-15 MED ORDER — DEXTROSE 5 % IV SOLN: INTRAVENOUS | Status: DC

## 2019-11-15 MED ORDER — ACETAMINOPHEN 325 MG PO TABS
325.0000 mg | ORAL_TABLET | ORAL | Status: DC
  Administered 2019-11-15: 325 mg via ORAL

## 2019-11-15 MED ORDER — ACETAMINOPHEN 325 MG PO TABS
ORAL_TABLET | ORAL | Status: AC
  Filled 2019-11-15: qty 1

## 2019-11-15 MED ORDER — IMMUNE GLOBULIN (HUMAN) 10 GM/100ML IV SOLN
90.0000 g | INTRAVENOUS | Status: DC
  Administered 2019-11-15: 90 g via INTRAVENOUS
  Filled 2019-11-15: qty 800

## 2019-11-15 NOTE — Discharge Instructions (Signed)
Immune Globulin Injection What is this medicine? IMMUNE GLOBULIN (im MUNE GLOB yoo lin) helps to prevent or reduce the severity of certain infections in patients who are at risk. This medicine is collected from the pooled blood of many donors. It is used to treat immune system problems, thrombocytopenia, and Kawasaki syndrome. This medicine may be used for other purposes; ask your health care provider or pharmacist if you have questions. COMMON BRAND NAME(S): ASCENIV, Baygam, BIVIGAM, Carimune, Carimune NF, cutaquig, Cuvitru, Flebogamma, Flebogamma DIF, GamaSTAN, GamaSTAN S/D, Gamimune N, Gammagard, Gammagard S/D, Gammaked, Gammaplex, Gammar-P IV, Gamunex, Gamunex-C, Hizentra, Iveegam, Iveegam EN, Octagam, Panglobulin, Panglobulin NF, panzyga, Polygam S/D, Privigen, Sandoglobulin, Venoglobulin-S, Vigam, Vivaglobulin, Xembify What should I tell my health care provider before I take this medicine? They need to know if you have any of these conditions:  diabetes  extremely low or no immune antibodies in the blood  heart disease  history of blood clots  hyperprolinemia  infection in the blood, sepsis  kidney disease  recently received or scheduled to receive a vaccination  an unusual or allergic reaction to human immune globulin, albumin, maltose, sucrose, other medicines, foods, dyes, or preservatives  pregnant or trying to get pregnant  breast-feeding How should I use this medicine? This medicine is for injection into a muscle or infusion into a vein or skin. It is usually given by a health care professional in a hospital or clinic setting. In rare cases, some brands of this medicine might be given at home. You will be taught how to give this medicine. Use exactly as directed. Take your medicine at regular intervals. Do not take your medicine more often than directed. Talk to your pediatrician regarding the use of this medicine in children. While this drug may be prescribed for selected  conditions, precautions do apply. Overdosage: If you think you have taken too much of this medicine contact a poison control center or emergency room at once. NOTE: This medicine is only for you. Do not share this medicine with others. What if I miss a dose? It is important not to miss your dose. Call your doctor or health care professional if you are unable to keep an appointment. If you give yourself the medicine and you miss a dose, take it as soon as you can. If it is almost time for your next dose, take only that dose. Do not take double or extra doses. What may interact with this medicine?  aspirin and aspirin-like medicines  cisplatin  cyclosporine  medicines for infection like acyclovir, adefovir, amphotericin B, bacitracin, cidofovir, foscarnet, ganciclovir, gentamicin, pentamidine, vancomycin  NSAIDS, medicines for pain and inflammation, like ibuprofen or naproxen  pamidronate  vaccines  zoledronic acid This list may not describe all possible interactions. Give your health care provider a list of all the medicines, herbs, non-prescription drugs, or dietary supplements you use. Also tell them if you smoke, drink alcohol, or use illegal drugs. Some items may interact with your medicine. What should I watch for while using this medicine? Your condition will be monitored carefully while you are receiving this medicine. This medicine is made from pooled blood donations of many different people. It may be possible to pass an infection in this medicine. However, the donors are screened for infections and all products are tested for HIV and hepatitis. The medicine is treated to kill most or all bacteria and viruses. Talk to your doctor about the risks and benefits of this medicine. Do not have vaccinations for at least   14 days before, or until at least 3 months after receiving this medicine. What side effects may I notice from receiving this medicine? Side effects that you should report  to your doctor or health care professional as soon as possible:  allergic reactions like skin rash, itching or hives, swelling of the face, lips, or tongue  blue colored lips or skin  breathing problems  chest pain or tightness  fever  signs and symptoms of aseptic meningitis such as stiff neck; sensitivity to light; headache; drowsiness; fever; nausea; vomiting; rash  signs and symptoms of a blood clot such as chest pain; shortness of breath; pain, swelling, or warmth in the leg  signs and symptoms of hemolytic anemia such as fast heartbeat; tiredness; dark yellow or brown urine; or yellowing of the eyes or skin  signs and symptoms of kidney injury like trouble passing urine or change in the amount of urine  sudden weight gain  swelling of the ankles, feet, hands Side effects that usually do not require medical attention (report to your doctor or health care professional if they continue or are bothersome):  diarrhea  flushing  headache  increased sweating  joint pain  muscle cramps  muscle pain  nausea  pain, redness, or irritation at site where injected  tiredness This list may not describe all possible side effects. Call your doctor for medical advice about side effects. You may report side effects to FDA at 1-800-FDA-1088. Where should I keep my medicine? Keep out of the reach of children. This drug is usually given in a hospital or clinic and will not be stored at home. In rare cases, some brands of this medicine may be given at home. If you are using this medicine at home, you will be instructed on how to store this medicine. Throw away any unused medicine after the expiration date on the label. NOTE: This sheet is a summary. It may not cover all possible information. If you have questions about this medicine, talk to your doctor, pharmacist, or health care provider.  2020 Elsevier/Gold Standard (2019-05-02 12:51:14)  

## 2019-11-26 ENCOUNTER — Other Ambulatory Visit: Payer: Self-pay

## 2019-11-26 ENCOUNTER — Other Ambulatory Visit: Payer: Self-pay | Admitting: Neurology

## 2019-11-26 DIAGNOSIS — G729 Myopathy, unspecified: Secondary | ICD-10-CM

## 2019-11-26 DIAGNOSIS — G7249 Other inflammatory and immune myopathies, not elsewhere classified: Secondary | ICD-10-CM

## 2019-11-27 LAB — CBC/DIFF AMBIGUOUS DEFAULT
Basophils Absolute: 0 10*3/uL (ref 0.0–0.2)
Basos: 1 %
EOS (ABSOLUTE): 0 10*3/uL (ref 0.0–0.4)
Eos: 0 %
Hematocrit: 43.7 % (ref 37.5–51.0)
Hemoglobin: 14.3 g/dL (ref 13.0–17.7)
Immature Grans (Abs): 0 10*3/uL (ref 0.0–0.1)
Immature Granulocytes: 1 %
Lymphocytes Absolute: 1.7 10*3/uL (ref 0.7–3.1)
Lymphs: 28 %
MCH: 30.2 pg (ref 26.6–33.0)
MCHC: 32.7 g/dL (ref 31.5–35.7)
MCV: 92 fL (ref 79–97)
Monocytes Absolute: 0.3 10*3/uL (ref 0.1–0.9)
Monocytes: 5 %
Neutrophils Absolute: 3.9 10*3/uL (ref 1.4–7.0)
Neutrophils: 65 %
Platelets: 205 10*3/uL (ref 150–450)
RBC: 4.74 x10E6/uL (ref 4.14–5.80)
RDW: 13.5 % (ref 11.6–15.4)
WBC: 6 10*3/uL (ref 3.4–10.8)

## 2019-11-27 LAB — COMPREHENSIVE METABOLIC PANEL
ALT: 133 IU/L — ABNORMAL HIGH (ref 0–44)
AST: 75 IU/L — ABNORMAL HIGH (ref 0–40)
Albumin/Globulin Ratio: 0.8 — ABNORMAL LOW (ref 1.2–2.2)
Albumin: 3.9 g/dL (ref 3.8–4.8)
Alkaline Phosphatase: 86 IU/L (ref 39–117)
BUN/Creatinine Ratio: 14 (ref 10–24)
BUN: 12 mg/dL (ref 8–27)
Bilirubin Total: 0.2 mg/dL (ref 0.0–1.2)
CO2: 24 mmol/L (ref 20–29)
Calcium: 10.1 mg/dL (ref 8.6–10.2)
Chloride: 96 mmol/L (ref 96–106)
Creatinine, Ser: 0.83 mg/dL (ref 0.76–1.27)
GFR calc Af Amer: 106 mL/min/{1.73_m2} (ref >59)
GFR calc non Af Amer: 92 mL/min/{1.73_m2} (ref >59)
Globulin, Total: 4.9 g/dL — ABNORMAL HIGH (ref 1.5–4.5)
Glucose: 253 mg/dL — ABNORMAL HIGH (ref 65–99)
Potassium: 5.1 mmol/L (ref 3.5–5.2)
Sodium: 137 mmol/L (ref 134–144)
Total Protein: 8.8 g/dL — ABNORMAL HIGH (ref 6.0–8.5)

## 2019-11-27 LAB — CK: Total CK: 318 U/L (ref 41–331)

## 2019-11-27 LAB — SPECIMEN STATUS REPORT

## 2019-11-30 ENCOUNTER — Ambulatory Visit: Payer: Medicare Other | Admitting: Neurology

## 2019-11-30 ENCOUNTER — Ambulatory Visit: Payer: Medicare Other

## 2019-12-01 ENCOUNTER — Other Ambulatory Visit: Payer: Self-pay | Admitting: Cardiology

## 2019-12-01 DIAGNOSIS — I2510 Atherosclerotic heart disease of native coronary artery without angina pectoris: Secondary | ICD-10-CM

## 2019-12-05 ENCOUNTER — Encounter (HOSPITAL_COMMUNITY): Payer: Medicare Other

## 2019-12-06 ENCOUNTER — Encounter (HOSPITAL_COMMUNITY): Payer: Medicare Other

## 2019-12-10 ENCOUNTER — Other Ambulatory Visit: Payer: Self-pay

## 2019-12-10 ENCOUNTER — Ambulatory Visit: Payer: Medicare Other | Admitting: Neurology

## 2019-12-10 ENCOUNTER — Encounter: Payer: Self-pay | Admitting: Neurology

## 2019-12-10 ENCOUNTER — Telehealth: Payer: Self-pay | Admitting: Neurology

## 2019-12-10 VITALS — BP 118/74 | HR 74 | Resp 18 | Ht 69.0 in | Wt 200.0 lb

## 2019-12-10 DIAGNOSIS — G7249 Other inflammatory and immune myopathies, not elsewhere classified: Secondary | ICD-10-CM

## 2019-12-10 DIAGNOSIS — E538 Deficiency of other specified B group vitamins: Secondary | ICD-10-CM

## 2019-12-10 MED ORDER — CYANOCOBALAMIN 1000 MCG/ML IJ SOLN: INTRAMUSCULAR | 5 refills | Status: DC

## 2019-12-10 MED ORDER — "SYRINGE 22G X 1"" 3 ML MISC": 0 refills | Status: DC

## 2019-12-10 NOTE — Telephone Encounter (Signed)
Patient's pharmacy called requesting clarification on the patient's B-12 prescription.

## 2019-12-10 NOTE — Progress Notes (Signed)
Follow-up Visit   Date: 12/10/19   Stanley Boyd MRN: [id number] DOB: 1951/06/28   Interim History: Stanley Boyd is a 69 y.o. 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  The patient was accompanied to the clinic by wife who also provides collateral information.    History of present illness: Early April 2020, he began having difficulty walking, especially raising his legs, such as when getting into a car.  This gradually progressed over the next 4 weeks to the point where he was unable to raise his leg to climb onto a step. Around the same time, he developed a skin rash of the chest and forearm.  He was evaluated by his PCP who noted 4-/5 muscle strength in the legs and markedly [place] at [address].  He was hospitalized at [facility] from 5/8 - 02/20/2019 for evaluation and hydration.  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  MRI left femur was most notable with diffuse, symmetric muscle edema and enhancement in the proximal hip girdle muscles. CK peaked at [address]. Left rectus femoris muscle biopsy and skin biopsy was performed.  He was emperically started on prednisone '80mg'$  due to high clinical suspicion for inflammatory myopathy, despite normal inflammatory/autoimmune markers (see below).  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  Skin biopsy showed benign lesion with fibrosis and mixed superficial and deep inflammation.  He has a history of CAD, followed by Dr. Einar Gip.  He was previously on statin therapy for ~ 2 years, but due to elevated liver enzymes, he was switched Praluent in 2019.  His LFTs have been elevated in the past,  the oldest labs in Epic are from November 2015 at [facility] which showed CK 219, AST 61*, ALT 90*, alk phos 126*.  He denies history of myalgias or cramps.    In late May, he established care with me and I checked labs through  [facility] which returned markedly positive for HMGCR antibodies (see below).    He started IVIG in July and Cellcept '500mg'$  BID, which has allowed safe tapering of prednisone.    He did see Dr. Tillman Abide at [facility] for second opinion, who agreed with my management plan.  As per their recommendation, swallow evaluation was ordered and shows mild aspiration risk.  He was recommended to use chin tuck position when swallowing thin liquids.  Since the summer,  he has been on tapering dose of prednisone and on '15mg'$  daily.  He has been doing great on this dose.  UPDATE 12/10/2019:  He is here for follow-up visit and is doing well.  His muscle enzymes remain within normal limits.  He gets tired easily, but is able to perform nearly all activities that he would like.  He is compliant with Cellcept '500mg'$  BID, prednisone '15mg'$  daily, and IVIG every 21 days. No new complaints.      Medications:  Current Outpatient Medications on File Prior to Visit  Medication Sig Dispense Refill  . aspirin 81 MG chewable tablet Chew 1 tablet (81 mg total) by mouth at bedtime.    . cyanocobalamin (,VITAMIN B-12,) 1000 MCG/ML injection Inject IM daily for 7 days, then weekly for 4 weeks, then monthly 10 mL  0  . finasteride (PROSCAR) 5 MG tablet Take 5 mg by mouth at bedtime.     . fluticasone (FLONASE) 50 MCG/ACT nasal spray Place 2 sprays into both nostrils daily as needed for allergies or rhinitis.    . Immune Globulin 10% (IMMUNE GLOBULIN 10%) 10G/176m (10,'000mg'$ /1024m SOLN Inject 1 g/kg into the vein every 24 hours x 2 doses. 75 g every 21 days for 2 consecutive days    . metFORMIN (GLUCOPHAGE) 500 MG tablet Take 1 tablet (500 mg total) by mouth 2  (two) times daily with a meal. 180 tablet 3  . mycophenolate (CELLCEPT) 500 MG tablet Take 1 tablet by mouth twice daily 180 tablet 3  . nitroGLYCERIN (NITROSTAT) 0.4 MG SL tablet Place 1 tablet (0.4 mg total) under the tongue every 5 (five) minutes as needed for chest pain. 25 tablet 4  . NON FORMULARY IVIG every 3 weeks for 2 days    . pantoprazole (PROTONIX) 40 MG tablet Take 1 tablet (40 mg total) by mouth daily. 90 tablet 3  . Polyvinyl Alcohol-Povidone (REFRESH OP) Place 1 drop into both eyes daily.    . Marland KitchenRALUENT 150 MG/ML SOAJ INJECT CONTENTS OF 1 PEN SUBCUTANEOUSLY EVERY TWO WEEKS 2 mL 6  . predniSONE (DELTASONE) 10 MG tablet Take 1.5 tablets (15 mg total) by mouth daily with breakfast. 150 tablet 2  . sulfamethoxazole-trimethoprim (BACTRIM DS) 800-160 MG tablet Take 1 tablet by mouth 3 (three) times a week. 40 tablet 3  . Syringe/Needle, Disp, (SYRINGE 3CC/22GX1") 22G X 1" 3 ML MISC Use with B12 injections 50 each 1  . tamsulosin (FLOMAX) 0.4 MG CAPS capsule Take 0.4 mg by mouth at bedtime.     No current facility-administered medications on file prior to visit.    Allergies:  Allergies  Allergen Reactions  . Statins Other (See Comments)    Myositis Myositis    Review of Systems:  CONSTITUTIONAL: No fevers, chills, night sweats, or weight loss.  EYES: No visual changes or eye pain ENT: No hearing changes.  No history of nose bleeds.   RESPIRATORY: No cough, wheezing and shortness of breath.   CARDIOVASCULAR: Negative for chest pain, and palpitations.   GI: Negative for abdominal discomfort, blood in stools or black stools.  No recent change in bowel habits.   GU:  No history of incontinence.   MUSCLOSKELETAL: No history of joint pain or swelling.  No myalgias.   SKIN: Negative for lesions, rash, and itching.   ENDOCRINE: Negative for cold or heat intolerance, polydipsia or goiter.   PSYCH:  No depression or anxiety symptoms.   NEURO: As Above.   Vital Signs:  BP 118/74    Pulse 74   Resp 18   Ht '5\' 9"'$  (1.753 m)   Wt 200 lb (90.7 kg)   SpO2 96%   BMI 29.53 kg/m   Neurological Exam: MENTAL STATUS including orientation to time, place, person, recent and remote memory, attention span and concentration, language, and fund of knowledge is normal.  Speech is sounds strong, no dysarthria.  CRANIAL NERVES:  Face is symmetric.   MOTOR:  No leg atrophy, No fasciculations or abnormal movements.  No pronator drift.   Upper Extremity:  Right  Left  Deltoid  5/5   5/5   Biceps  5/5   5/5   Triceps  5/5   5/5   Wrist extensors  5-/5   5/5   Wrist flexors  5-/5   5/5   Finger extensors  4/5   5/5   Finger flexors  4/5   5/5   Dorsal interossei  4/5   5/5   Abductor pollicis  4/5   5/5   Tone (Ashworth scale)  0  0   Lower Extremity:  Right  Left  Hip flexors  5/5   5/5   Hip extensors  5/5   5/5   Adductor 5/5  5/5  Abductor  5/5  5/5  Knee flexors  5/5   5/5   Knee extensors  5/5   5/5   Dorsiflexors  5/5   5/5   Plantarflexors  5/5   5/5   Toe extensors  5/5   5/5   Toe flexors  5/5   5/5   Tone (Ashworth scale)  0  0   COORDINATION/GAIT:  He is able to stand up from chair without using arms to push off. Gait is normal, no trendelenburg.   Data: MRI cervical spine without contrast 02/17/2019: 1. 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 2. Solid anterior fusion at C4-5 and C5-6 without spinal stenosis at these levels. 3. Adjacent segment degenerative changes with moderate spinal stenosis at C6-7, mild-to-moderate spinal stenosis at C3-4, and mild-to-moderate neural foraminal stenosis at both levels.  MRI lumbar spine 02/17/2019: 1. Relatively diffuse lumbar disc and facet degeneration without significant spinal stenosis. 2. Multilevel neural foraminal stenosis, moderate on the left at L5-S1. 3. Normal appearance of the conus medullaris and cauda equina.  MRI thoracic spine without contrast 02/17/2019: 1. Mild thoracic disc and facet degeneration without  evidence of neural impingement. 2. Minimal prominence of the central canal of the mid to lower thoracic spinal cord without syrinx.  MRI left femur with and without contrast 02/18/2019: 1. Abnormal diffuse muscular edema and enhancement involving various muscles of the lower pelvis and thigh as detailed above. The coronal images indicate that this is a grossly symmetric process. I note that there is a plan for quadriceps muscle biopsy; I would recommend targeting the rectus femoris muscle and referring to today's MRI images given that the other quadriceps muscles demonstrate only minimal involvement. 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  CT chest w contrast 02/17/2019: 1. No CT evidence of malignancy in the chest, abdomen, or pelvis. 2. Innumerable tiny centrilobular pulmonary nodules in an apically predominant distribution, which may reflect sequelae of atypical infection or smoking-related respiratory bronchiolitis. There is an unchanged, benign somewhat bandlike consolidation in the right lung base. 3. Coronary artery disease. 4. Atherosclerosis. 5. Prostatomegaly.  Labs 02/17/2019:  Aldolase 122, anti-Jo neg, ANA neg, anti-Scl 70 neg, CRP 1.1, ESR 12, Ehrlichia antibody neg, Lyme neg, acute hepatitis panel neg, HIV neg, VGCC neg, paraneoplastic antibody panel neg 02/15/2019:  vitamin B12 162*, TSH 4.86*, free T4 0.78  Labs 08/2014 at [facility]:  CK 219, AST 61*, ALT 90*, alk phos 126*   Lab Results  Component Value Date   CKTOTAL 318 11/26/2019   CKMB 10 (H) 03/07/2019   TROPONINI 0.03 ([place]) 02/17/2019    Left rectus femorus muscle biopsy 02/20/2019:   Active necrotizing inflammatory myopathy. Right chest wall skin biopsy:  Benign skin with fibrosis and mixed superficial and deep inflammation  Neuromuscular Lab - [facility] 03/20/2019:  Myopathy 2 panel - HMGCR 36,000 (  normal <2500)  IMPRESSION/PLAN: 1. 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  Diagnosis was confirmed by antibody testing and supported by pathology findings.  His most recent CK from February remains normal. 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  The goal is to slowly taper his IVIG to every 28 days.  He has been on Cellcept for over 9 months now and the therapeutic benefit can be appreciated.  If there is relapse going forward, his prednisone can be increased to '20mg'$  daily and Cellcept to '750mg'$  BID.  2. Vitamin B12 deficiency, continue monthly injections.   PLAN/RECOMMENDATIONS:  1.  Reduce IVIG to 1g/kg every 28 days. 2.  Continue Cellcept '500mg'$  BID 3.  Continue prednisone '15mg'$  daily 4.  OK to stop bactrim since he is on less than '20mg'$  of prednisone.  Continue protonix and calcium 5.  Check CBC, CMP, and CK the 15th of every month.  As long as CK is < 750 and he is asymptomatic, no need to make medication changes.  6.  Continue vitamin B12 1068mg daily x 6 months, then recheck levsl  Return to clinic in 6 weeks   Thank you for allowing me to participate in patient's care.  If I can answer any additional questions, I would be pleased to do so.    Sincerely,    Vinette Crites K. PPosey Pronto DO

## 2019-12-10 NOTE — Patient Instructions (Addendum)
Adjust IVIG to 1mg /kg one day every 28 years Stop bactrim Continue prednisone 15mg  daily Continue Cellcept 500mg  twice daily Contiue vitamin B12 1058mcg monthly Check labs around the 15th of each month Return to clinic on 4/23 at 11:10a

## 2019-12-10 NOTE — Telephone Encounter (Signed)
Called pharmacy

## 2019-12-11 ENCOUNTER — Other Ambulatory Visit: Payer: Self-pay | Admitting: Cardiology

## 2019-12-11 ENCOUNTER — Other Ambulatory Visit: Payer: Self-pay

## 2019-12-11 ENCOUNTER — Ambulatory Visit: Payer: Self-pay

## 2019-12-11 DIAGNOSIS — I2510 Atherosclerotic heart disease of native coronary artery without angina pectoris: Secondary | ICD-10-CM

## 2019-12-11 MED ORDER — CYANOCOBALAMIN 1000 MCG/ML IJ SOLN: INTRAMUSCULAR | 0 refills | Status: DC

## 2019-12-11 NOTE — Telephone Encounter (Signed)
Please call pharmacy directly about this. Pharmacist called back and said the patient's wife came in and said the patient was seen yesterday by Dr. Posey Pronto and will only need to take the medication monthly. Please confirm.

## 2019-12-11 NOTE — Telephone Encounter (Signed)
Patient's pharmacy called again and said the amount 6 ML doesn't seem correct. They need to know how much to inject for each dose. And, should the Rx be changed back to 20 ML.

## 2019-12-11 NOTE — Telephone Encounter (Signed)
Sherry from the pharmacy called again requesting to speak with Alyse Low about this prescription. They need a quantity.

## 2019-12-11 NOTE — Telephone Encounter (Signed)
Called and discussed Rx as noted previously directly with pharmacist.

## 2019-12-11 NOTE — Telephone Encounter (Signed)
They are questioning the quantity, please advise

## 2019-12-11 NOTE — Telephone Encounter (Signed)
New Rx sent for 74mL, 0 refills.

## 2019-12-11 NOTE — Telephone Encounter (Signed)
Please confirm

## 2019-12-12 ENCOUNTER — Encounter (HOSPITAL_COMMUNITY): Payer: Medicare Other

## 2019-12-13 ENCOUNTER — Other Ambulatory Visit: Payer: Self-pay

## 2019-12-13 ENCOUNTER — Encounter (HOSPITAL_COMMUNITY): Payer: Self-pay

## 2019-12-13 ENCOUNTER — Ambulatory Visit (HOSPITAL_COMMUNITY)
Admission: RE | Admit: 2019-12-13 | Discharge: 2019-12-13 | Disposition: A | Discharge status: Home / Self Care | Payer: Medicare Other | Source: Ambulatory Visit | Attending: Neurology | Admitting: Neurology

## 2019-12-13 ENCOUNTER — Other Ambulatory Visit (HOSPITAL_COMMUNITY): Payer: Self-pay | Admitting: General Practice

## 2019-12-13 DIAGNOSIS — G7249 Other inflammatory and immune myopathies, not elsewhere classified: Secondary | ICD-10-CM | POA: Insufficient documentation

## 2019-12-13 MED ORDER — DEXTROSE 5 % IV SOLN: INTRAVENOUS | Status: DC

## 2019-12-13 MED ORDER — IMMUNE GLOBULIN (HUMAN) 10 GM/100ML IV SOLN
1.0000 g/kg | INTRAVENOUS | Status: DC
  Administered 2019-12-13: 95 g via INTRAVENOUS
  Filled 2019-12-13: qty 950

## 2019-12-13 MED ORDER — ACETAMINOPHEN 325 MG PO TABS
325.0000 mg | ORAL_TABLET | ORAL | Status: DC
  Administered 2019-12-13: 325 mg via ORAL
  Filled 2019-12-13: qty 1

## 2019-12-13 MED ORDER — DIPHENHYDRAMINE HCL 25 MG PO CAPS
25.0000 mg | ORAL_CAPSULE | ORAL | Status: DC
  Administered 2019-12-13: 25 mg via ORAL
  Filled 2019-12-13: qty 1

## 2019-12-13 MED ORDER — IMMUNE GLOBULIN HUMAN NICU IV SYRINGE 100 MG/ML: 1000.0000 mg/kg | INTRAMUSCULAR | Status: DC

## 2019-12-13 MED ORDER — IMMUNE GLOBULIN (HUMAN) 10 GM/100ML IV SOLN
1.0000 g/kg | INTRAVENOUS | Status: DC
  Filled 2019-12-13: qty 1000

## 2019-12-24 ENCOUNTER — Other Ambulatory Visit: Payer: Self-pay | Admitting: Neurology

## 2019-12-25 LAB — COMPREHENSIVE METABOLIC PANEL
ALT: 90 IU/L — ABNORMAL HIGH (ref 0–44)
AST: 47 IU/L — ABNORMAL HIGH (ref 0–40)
Albumin/Globulin Ratio: 1 — ABNORMAL LOW (ref 1.2–2.2)
Albumin: 4 g/dL (ref 3.8–4.8)
Alkaline Phosphatase: 76 IU/L (ref 39–117)
BUN/Creatinine Ratio: 15 (ref 10–24)
BUN: 13 mg/dL (ref 8–27)
Bilirubin Total: 0.3 mg/dL (ref 0.0–1.2)
CO2: 23 mmol/L (ref 20–29)
Calcium: 10.3 mg/dL — ABNORMAL HIGH (ref 8.6–10.2)
Chloride: 96 mmol/L (ref 96–106)
Creatinine, Ser: 0.89 mg/dL (ref 0.76–1.27)
GFR calc Af Amer: 103 mL/min/{1.73_m2} (ref >59)
GFR calc non Af Amer: 89 mL/min/{1.73_m2} (ref >59)
Globulin, Total: 4 g/dL (ref 1.5–4.5)
Glucose: 259 mg/dL — ABNORMAL HIGH (ref 65–99)
Potassium: 5 mmol/L (ref 3.5–5.2)
Sodium: 134 mmol/L (ref 134–144)
Total Protein: 8 g/dL (ref 6.0–8.5)

## 2019-12-25 LAB — CBC/DIFF AMBIGUOUS DEFAULT
Basophils Absolute: 0.1 10*3/uL (ref 0.0–0.2)
Basos: 1 %
EOS (ABSOLUTE): 0 10*3/uL (ref 0.0–0.4)
Eos: 0 %
Hematocrit: 44.6 % (ref 37.5–51.0)
Hemoglobin: 14.6 g/dL (ref 13.0–17.7)
Immature Grans (Abs): 0 10*3/uL (ref 0.0–0.1)
Immature Granulocytes: 1 %
Lymphocytes Absolute: 2.2 10*3/uL (ref 0.7–3.1)
Lymphs: 28 %
MCH: 30.2 pg (ref 26.6–33.0)
MCHC: 32.7 g/dL (ref 31.5–35.7)
MCV: 92 fL (ref 79–97)
Monocytes Absolute: 0.3 10*3/uL (ref 0.1–0.9)
Monocytes: 4 %
Neutrophils Absolute: 5.1 10*3/uL (ref 1.4–7.0)
Neutrophils: 66 %
Platelets: 224 10*3/uL (ref 150–450)
RBC: 4.84 x10E6/uL (ref 4.14–5.80)
RDW: 14.5 % (ref 11.6–15.4)
WBC: 7.7 10*3/uL (ref 3.4–10.8)

## 2019-12-25 LAB — CK: Total CK: 175 U/L (ref 41–331)

## 2019-12-25 LAB — SPECIMEN STATUS REPORT

## 2019-12-26 ENCOUNTER — Encounter (HOSPITAL_COMMUNITY): Payer: Medicare Other

## 2019-12-27 ENCOUNTER — Encounter (HOSPITAL_COMMUNITY): Payer: Medicare Other

## 2020-01-02 ENCOUNTER — Encounter (HOSPITAL_COMMUNITY): Payer: Medicare Other

## 2020-01-03 ENCOUNTER — Encounter (HOSPITAL_COMMUNITY): Payer: Medicare Other

## 2020-01-07 ENCOUNTER — Ambulatory Visit: Payer: Medicare Other | Admitting: Neurology

## 2020-01-09 ENCOUNTER — Other Ambulatory Visit: Payer: Self-pay | Admitting: Family Medicine

## 2020-01-09 ENCOUNTER — Encounter: Payer: Self-pay | Admitting: Family Medicine

## 2020-01-09 LAB — CBC AND DIFFERENTIAL
HCT: 42 (ref 41–53)
Hemoglobin: 13.9 (ref 13.5–17.5)
Neutrophils Absolute: 55
Platelets: 228 (ref 150–399)
WBC: 6.9

## 2020-01-09 LAB — HEPATIC FUNCTION PANEL
ALT: 54 — AB (ref 10–40)
AST: 38 (ref 14–40)
Alkaline Phosphatase: 59 (ref 25–125)
Bilirubin, Total: 0.4

## 2020-01-09 LAB — COMPREHENSIVE METABOLIC PANEL
Albumin: 4.3 (ref 3.5–5.0)
Calcium: 9.7 (ref 8.7–10.7)
GFR calc Af Amer: 104
GFR calc non Af Amer: 90
Globulin: 3.1

## 2020-01-09 LAB — BASIC METABOLIC PANEL
BUN: 13 (ref 4–21)
CO2: 26 — AB (ref 13–22)
Chloride: 98 — AB (ref 99–108)
Creatinine: 0.9 (ref 0.6–1.3)
Glucose: 132
Potassium: 4.3 (ref 3.4–5.3)
Sodium: 138 (ref 137–147)

## 2020-01-09 LAB — LIPID PANEL
Cholesterol: 211 — AB (ref 0–200)
HDL: 45 (ref 35–70)
LDL Cholesterol: 43
Triglycerides: 245 — AB (ref 40–160)

## 2020-01-09 LAB — HEMOGLOBIN A1C: Hemoglobin A1C: 7.4

## 2020-01-09 LAB — CBC: RBC: 4.52 (ref 3.87–5.11)

## 2020-01-09 LAB — TSH: TSH: 4.26 (ref 0.41–5.90)

## 2020-01-10 ENCOUNTER — Encounter (HOSPITAL_COMMUNITY): Payer: Self-pay

## 2020-01-10 ENCOUNTER — Other Ambulatory Visit: Payer: Self-pay

## 2020-01-10 ENCOUNTER — Ambulatory Visit (HOSPITAL_COMMUNITY)
Admission: RE | Admit: 2020-01-10 | Discharge: 2020-01-10 | Disposition: A | Discharge status: Home / Self Care | Payer: Medicare Other | Source: Ambulatory Visit | Attending: Neurology | Admitting: Neurology

## 2020-01-10 DIAGNOSIS — G7249 Other inflammatory and immune myopathies, not elsewhere classified: Secondary | ICD-10-CM | POA: Diagnosis present

## 2020-01-10 LAB — COMPREHENSIVE METABOLIC PANEL
ALT: 54 IU/L — ABNORMAL HIGH (ref 0–44)
AST: 38 IU/L (ref 0–40)
Albumin/Globulin Ratio: 1.4 (ref 1.2–2.2)
Albumin: 4.3 g/dL (ref 3.8–4.8)
Alkaline Phosphatase: 59 IU/L (ref 39–117)
BUN/Creatinine Ratio: 15 (ref 10–24)
BUN: 13 mg/dL (ref 8–27)
Bilirubin Total: 0.4 mg/dL (ref 0.0–1.2)
CO2: 26 mmol/L (ref 20–29)
Calcium: 9.7 mg/dL (ref 8.6–10.2)
Chloride: 98 mmol/L (ref 96–106)
Creatinine, Ser: 0.86 mg/dL (ref 0.76–1.27)
GFR calc Af Amer: 104 mL/min/{1.73_m2} (ref >59)
GFR calc non Af Amer: 90 mL/min/{1.73_m2} (ref >59)
Globulin, Total: 3.1 g/dL (ref 1.5–4.5)
Glucose: 132 mg/dL — ABNORMAL HIGH (ref 65–99)
Potassium: 4.3 mmol/L (ref 3.5–5.2)
Sodium: 138 mmol/L (ref 134–144)
Total Protein: 7.4 g/dL (ref 6.0–8.5)

## 2020-01-10 LAB — CBC/DIFF AMBIGUOUS DEFAULT
Basophils Absolute: 0.1 10*3/uL (ref 0.0–0.2)
Basos: 1 %
EOS (ABSOLUTE): 0.1 10*3/uL (ref 0.0–0.4)
Eos: 1 %
Hematocrit: 41.9 % (ref 37.5–51.0)
Hemoglobin: 13.9 g/dL (ref 13.0–17.7)
Immature Grans (Abs): 0 10*3/uL (ref 0.0–0.1)
Immature Granulocytes: 0 %
Lymphocytes Absolute: 2.4 10*3/uL (ref 0.7–3.1)
Lymphs: 35 %
MCH: 30.8 pg (ref 26.6–33.0)
MCHC: 33.2 g/dL (ref 31.5–35.7)
MCV: 93 fL (ref 79–97)
Monocytes Absolute: 0.6 10*3/uL (ref 0.1–0.9)
Monocytes: 8 %
Neutrophils Absolute: 3.8 10*3/uL (ref 1.4–7.0)
Neutrophils: 55 %
Platelets: 228 10*3/uL (ref 150–450)
RBC: 4.52 x10E6/uL (ref 4.14–5.80)
RDW: 14.9 % (ref 11.6–15.4)
WBC: 6.9 10*3/uL (ref 3.4–10.8)

## 2020-01-10 LAB — TSH: TSH: 4.26 u[IU]/mL (ref 0.450–4.500)

## 2020-01-10 LAB — LIPID PANEL W/O CHOL/HDL RATIO
Cholesterol, Total: 211 mg/dL — ABNORMAL HIGH (ref 100–199)
HDL: 45 mg/dL (ref >39)
LDL Chol Calc (NIH): 123 mg/dL — ABNORMAL HIGH (ref 0–99)
Triglycerides: 245 mg/dL — ABNORMAL HIGH (ref 0–149)
VLDL Cholesterol Cal: 43 mg/dL — ABNORMAL HIGH (ref 5–40)

## 2020-01-10 LAB — AMBIG ABBREV CMP13 DEFAULT

## 2020-01-10 LAB — HGB A1C W/O EAG: Hgb A1c MFr Bld: 7.4 % — ABNORMAL HIGH (ref 4.8–5.6)

## 2020-01-10 LAB — CK: Total CK: 454 U/L — ABNORMAL HIGH (ref 41–331)

## 2020-01-10 LAB — SPECIMEN STATUS REPORT

## 2020-01-10 MED ORDER — DIPHENHYDRAMINE HCL 25 MG PO CAPS
25.0000 mg | ORAL_CAPSULE | ORAL | Status: DC
  Administered 2020-01-10: 25 mg via ORAL

## 2020-01-10 MED ORDER — IMMUNE GLOBULIN (HUMAN) 10 GM/100ML IV SOLN
1.0000 g/kg | INTRAVENOUS | Status: DC
  Administered 2020-01-10: 95 g via INTRAVENOUS
  Filled 2020-01-10: qty 800

## 2020-01-10 MED ORDER — DEXTROSE 5 % IV SOLN: INTRAVENOUS | Status: DC

## 2020-01-10 MED ORDER — ACETAMINOPHEN 325 MG PO TABS
325.0000 mg | ORAL_TABLET | ORAL | Status: DC
  Administered 2020-01-10: 325 mg via ORAL

## 2020-01-10 MED ORDER — DIPHENHYDRAMINE HCL 25 MG PO CAPS
ORAL_CAPSULE | ORAL | Status: AC
  Filled 2020-01-10: qty 1

## 2020-01-10 MED ORDER — ACETAMINOPHEN 325 MG PO TABS
ORAL_TABLET | ORAL | Status: AC
  Filled 2020-01-10: qty 1

## 2020-01-11 ENCOUNTER — Ambulatory Visit: Payer: Medicare Other | Admitting: Cardiology

## 2020-01-16 ENCOUNTER — Encounter: Payer: Self-pay | Admitting: Family Medicine

## 2020-01-18 ENCOUNTER — Ambulatory Visit: Payer: Medicare Other | Admitting: Cardiology

## 2020-01-18 ENCOUNTER — Encounter: Payer: Self-pay | Admitting: Family Medicine

## 2020-01-18 ENCOUNTER — Ambulatory Visit (INDEPENDENT_AMBULATORY_CARE_PROVIDER_SITE_OTHER): Payer: Medicare Other

## 2020-01-18 ENCOUNTER — Other Ambulatory Visit: Payer: Self-pay

## 2020-01-18 ENCOUNTER — Ambulatory Visit (INDEPENDENT_AMBULATORY_CARE_PROVIDER_SITE_OTHER): Payer: Medicare Other | Admitting: Family Medicine

## 2020-01-18 ENCOUNTER — Encounter: Payer: Self-pay | Admitting: Cardiology

## 2020-01-18 VITALS — BP 120/76 | HR 66 | Temp 98.2°F | Ht 69.0 in | Wt 203.0 lb

## 2020-01-18 VITALS — BP 122/64 | HR 52 | Temp 98.0°F | Resp 14 | Ht 69.0 in | Wt 200.8 lb

## 2020-01-18 DIAGNOSIS — T380X5A Adverse effect of glucocorticoids and synthetic analogues, initial encounter: Secondary | ICD-10-CM

## 2020-01-18 DIAGNOSIS — Z0000 Encounter for general adult medical examination without abnormal findings: Secondary | ICD-10-CM

## 2020-01-18 DIAGNOSIS — E7800 Pure hypercholesterolemia, unspecified: Secondary | ICD-10-CM

## 2020-01-18 DIAGNOSIS — I69959 Hemiplegia and hemiparesis following unspecified cerebrovascular disease affecting unspecified side: Secondary | ICD-10-CM

## 2020-01-18 DIAGNOSIS — I2510 Atherosclerotic heart disease of native coronary artery without angina pectoris: Secondary | ICD-10-CM

## 2020-01-18 DIAGNOSIS — N400 Enlarged prostate without lower urinary tract symptoms: Secondary | ICD-10-CM

## 2020-01-18 DIAGNOSIS — R739 Hyperglycemia, unspecified: Secondary | ICD-10-CM | POA: Diagnosis not present

## 2020-01-18 DIAGNOSIS — R7401 Elevation of levels of liver transaminase levels: Secondary | ICD-10-CM

## 2020-01-18 DIAGNOSIS — G7249 Other inflammatory and immune myopathies, not elsewhere classified: Secondary | ICD-10-CM

## 2020-01-18 DIAGNOSIS — E538 Deficiency of other specified B group vitamins: Secondary | ICD-10-CM

## 2020-01-18 DIAGNOSIS — E119 Type 2 diabetes mellitus without complications: Secondary | ICD-10-CM

## 2020-01-18 MED ORDER — ICOSAPENT ETHYL 1 G PO CAPS: 2.0000 g | ORAL_CAPSULE | Freq: Two times a day (BID) | ORAL | 6 refills | Status: DC

## 2020-01-18 MED ORDER — METFORMIN HCL 500 MG PO TABS: 500.0000 mg | ORAL_TABLET | Freq: Two times a day (BID) | ORAL | 3 refills | Status: DC

## 2020-01-18 NOTE — Patient Instructions (Signed)
Stanley Boyd , Thank you for taking time to come for your Medicare Wellness Visit. I appreciate your ongoing commitment to your health goals. Please review the following plan we discussed and let me know if I can assist you in the future.   Screening recommendations/referrals: Colorectal Screening: last colonosocopy 04/05/14; scheduled for repeat with Dr. Oneida Alar   Vision and Dental Exams: Recommended annual ophthalmology exams for early detection of glaucoma and other disorders of the eye Recommended annual dental exams for proper oral hygiene  Diabetic Exams: Diabetic Eye Exam: recommended yearly Diabetic Foot Exam: recommended yearly  Vaccinations: Influenza vaccine: completed 06/28/19 Pneumococcal vaccine: up to date; last 12/03/16 Tdap vaccine: recommended every 10 years; Please call your insurance company to determine your out of pocket expense. You also receive this vaccine at your local pharmacy or Health Dept. Shingles vaccine: You may receive this vaccine at your local pharmacy. (see handout)   Advanced directives: Please bring a copy of your POA (Power of Attorney) and/or Living Will to your next appointment.  Goals: Recommend to drink at least 6-8 8oz glasses of water per day and consume a balanced diet rich in fresh fruits and vegetables.   Next appointment: Please schedule your Annual Wellness Visit with your Nurse Health Advisor in one year.  Preventive Care 65 Years and Older, Male Preventive care refers to lifestyle choices and visits with your health care provider that can promote health and wellness. What does preventive care include?  A yearly physical exam. This is also called an annual well check.  Dental exams once or twice a year.  Routine eye exams. Ask your health care provider how often you should have your eyes checked.  Personal lifestyle choices, including:  Daily care of your teeth and gums.  Regular physical activity.  Eating a healthy  diet.  Avoiding tobacco and drug use.  Limiting alcohol use.  Practicing safe sex.  Taking low doses of aspirin every day if recommended by your health care provider..  Taking vitamin and mineral supplements as recommended by your health care provider. What happens during an annual well check? The services and screenings done by your health care provider during your annual well check will depend on your age, overall health, lifestyle risk factors, and family history of disease. Counseling  Your health care provider may ask you questions about your:  Alcohol use.  Tobacco use.  Drug use.  Emotional well-being.  Home and relationship well-being.  Sexual activity.  Eating habits.  History of falls.  Memory and ability to understand (cognition).  Work and work Statistician. Screening  You may have the following tests or measurements:  Height, weight, and BMI.  Blood pressure.  Lipid and cholesterol levels. These may be checked every 5 years, or more frequently if you are over 62 years old.  Skin check.  Lung cancer screening. You may have this screening every year starting at age 68 if you have a 30-pack-year history of smoking and currently smoke or have quit within the past 15 years.  Fecal occult blood test (FOBT) of the stool. You may have this test every year starting at age 53.  Flexible sigmoidoscopy or colonoscopy. You may have a sigmoidoscopy every 5 years or a colonoscopy every 10 years starting at age 30.  Prostate cancer screening. Recommendations will vary depending on your family history and other risks.  Hepatitis C blood test.  Hepatitis B blood test.  Sexually transmitted disease (STD) testing.  Diabetes screening. This is done  by checking your blood sugar (glucose) after you have not eaten for a while (fasting). You may have this done every 1-3 years.  Abdominal aortic aneurysm (AAA) screening. You may need this if you are a current or former  smoker.  Osteoporosis. You may be screened starting at age 57 if you are at high risk. Talk with your health care provider about your test results, treatment options, and if necessary, the need for more tests. Vaccines  Your health care provider may recommend certain vaccines, such as:  Influenza vaccine. This is recommended every year.  Tetanus, diphtheria, and acellular pertussis (Tdap, Td) vaccine. You may need a Td booster every 10 years.  Zoster vaccine. You may need this after age 52.  Pneumococcal 13-valent conjugate (PCV13) vaccine. One dose is recommended after age 87.  Pneumococcal polysaccharide (PPSV23) vaccine. One dose is recommended after age 33. Talk to your health care provider about which screenings and vaccines you need and how often you need them. This information is not intended to replace advice given to you by your health care provider. Make sure you discuss any questions you have with your health care provider. Document Released: 10/24/2015 Document Revised: 06/16/2016 Document Reviewed: 07/29/2015 Elsevier Interactive Patient Education  2017 [employer] Prevention in the Home Falls can cause injuries. They can happen to people of all ages. There are many things you can do to make your home safe and to help prevent falls. What can I do on the outside of my home?  Regularly fix the edges of walkways and driveways and fix any cracks.  Remove anything that might make you trip as you walk through a door, such as a raised step or threshold.  Trim any bushes or trees on the path to your home.  Use bright outdoor lighting.  Clear any walking paths of anything that might make someone trip, such as rocks or tools.  Regularly check to see if handrails are loose or broken. Make sure that both sides of any steps have handrails.  Any raised decks and porches should have guardrails on the edges.  Have any leaves, snow, or ice cleared regularly.  Use sand or  salt on walking paths during winter.  Clean up any spills in your garage right away. This includes oil or grease spills. What can I do in the bathroom?  Use night lights.  Install grab bars by the toilet and in the tub and shower. Do not use towel bars as grab bars.  Use non-skid mats or decals in the tub or shower.  If you need to sit down in the shower, use a plastic, non-slip stool.  Keep the floor dry. Clean up any water that spills on the floor as soon as it happens.  Remove soap buildup in the tub or shower regularly.  Attach bath mats securely with double-sided non-slip rug tape.  Do not have throw rugs and other things on the floor that can make you trip. What can I do in the bedroom?  Use night lights.  Make sure that you have a light by your bed that is easy to reach.  Do not use any sheets or blankets that are too big for your bed. They should not hang down onto the floor.  Have a firm chair that has side arms. You can use this for support while you get dressed.  Do not have throw rugs and other things on the floor that can make you trip. What can  I do in the kitchen?  Clean up any spills right away.  Avoid walking on wet floors.  Keep items that you use a lot in easy-to-reach places.  If you need to reach something above you, use a strong step stool that has a grab bar.  Keep electrical cords out of the way.  Do not use floor polish or wax that makes floors slippery. If you must use wax, use non-skid floor wax.  Do not have throw rugs and other things on the floor that can make you trip. What can I do with my stairs?  Do not leave any items on the stairs.  Make sure that there are handrails on both sides of the stairs and use them. Fix handrails that are broken or loose. Make sure that handrails are as long as the stairways.  Check any carpeting to make sure that it is firmly attached to the stairs. Fix any carpet that is loose or worn.  Avoid having  throw rugs at the top or bottom of the stairs. If you do have throw rugs, attach them to the floor with carpet tape.  Make sure that you have a light switch at the top of the stairs and the bottom of the stairs. If you do not have them, ask someone to add them for you. What else can I do to help prevent falls?  Wear shoes that:  Do not have high heels.  Have rubber bottoms.  Are comfortable and fit you well.  Are closed at the toe. Do not wear sandals.  If you use a stepladder:  Make sure that it is fully opened. Do not climb a closed stepladder.  Make sure that both sides of the stepladder are locked into place.  Ask someone to hold it for you, if possible.  Clearly mark and make sure that you can see:  Any grab bars or handrails.  First and last steps.  Where the edge of each step is.  Use tools that help you move around (mobility aids) if they are needed. These include:  Canes.  Walkers.  Scooters.  Crutches.  Turn on the lights when you go into a dark area. Replace any light bulbs as soon as they burn out.  Set up your furniture so you have a clear path. Avoid moving your furniture around.  If any of your floors are uneven, fix them.  If there are any pets around you, be aware of where they are.  Review your medicines with your doctor. Some medicines can make you feel dizzy. This can increase your chance of falling. Ask your doctor what other things that you can do to help prevent falls. This information is not intended to replace advice given to you by your health care provider. Make sure you discuss any questions you have with your health care provider. Document Released: 07/24/2009 Document Revised: 03/04/2016 Document Reviewed: 11/01/2014 Elsevier Interactive Patient Education  2017 [employer].

## 2020-01-18 NOTE — Patient Instructions (Addendum)
Health Maintenance Due  Topic Date Due  . COLONOSCOPY  Has app for it next week  04/06/2019  . TETANUS/TDAP- due for tdap- if you get a cut/scrape you need to get this or you can get that up dated- at pharmacy is cheaper usually. No stepping on nails!   Also due for shingrix (ok to hold off with prior reaction to covid) at pharmacy 08/29/2019   For steroid related high blood sugar- a1c is up some as of 10 days ago- we are going to increase metformin to 1000mg  in Am and 500 in evening and follow up in 14 weeks for recheck.   - try to limit/cut out sweet tea  No changes other than the metformin as above   Team- please print off labs for future labs- they are going to take to [facility] before next labs  Follow up in 14 weeks or so- at least so 91 days passes between labs
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
## 2020-01-20 MED ORDER — EZETIMIBE 10 MG PO TABS: 10.0000 mg | ORAL_TABLET | Freq: Every day | ORAL | 2 refills | Status: DC

## 2020-01-22 ENCOUNTER — Other Ambulatory Visit: Payer: Self-pay

## 2020-01-22 ENCOUNTER — Other Ambulatory Visit (HOSPITAL_COMMUNITY)
Admission: RE | Admit: 2020-01-22 | Discharge: 2020-01-22 | Disposition: A | Discharge status: Home / Self Care | Payer: Medicare Other | Source: Ambulatory Visit | Attending: Gastroenterology | Admitting: Gastroenterology

## 2020-01-22 DIAGNOSIS — Z20822 Contact with and (suspected) exposure to covid-19: Secondary | ICD-10-CM | POA: Insufficient documentation

## 2020-01-22 DIAGNOSIS — Z01812 Encounter for preprocedural laboratory examination: Secondary | ICD-10-CM | POA: Insufficient documentation

## 2020-01-23 ENCOUNTER — Other Ambulatory Visit: Payer: Self-pay

## 2020-01-23 ENCOUNTER — Telehealth: Payer: Self-pay | Admitting: Family Medicine

## 2020-01-23 ENCOUNTER — Encounter: Payer: Self-pay | Admitting: Family Medicine

## 2020-01-23 ENCOUNTER — Other Ambulatory Visit (HOSPITAL_COMMUNITY): Payer: Medicare Other

## 2020-01-23 DIAGNOSIS — H4710 Unspecified papilledema: Secondary | ICD-10-CM

## 2020-01-23 DIAGNOSIS — H4711 Papilledema associated with increased intracranial pressure: Secondary | ICD-10-CM

## 2020-01-23 LAB — SARS CORONAVIRUS 2 (TAT 6-24 HRS): SARS Coronavirus 2: NEGATIVE

## 2020-01-23 NOTE — Telephone Encounter (Signed)
Spoke with Dr. Lysle Rubens optho  Patient noted with black spot left visual field vision 20/80 in left eye reduced from right eye. Noted papilledema left greater than right. Concern for possible  idiopathic intracranial hypertension. No headache.   He asks for neuroimaging- CT or MRI- I think I can get CT quicker so I am ordering that stat. I am ok with changing to wo contrast if radiology or Dr. Posey Pronto thinks adequate but I ordered with and without contrast. CT is stat  Dr. Rosalita Chessman asks for patient to be seen sooner by Dr. Posey Pronto and for consideration of lumbar puncture- I will forward this to her. He also asks about seeing neuro-ophthalmology potentially -I told him we do not have that available in [place] that I am aware of but that Dr. Posey Pronto would probably be more knowledgeable.  Spoke with son Thersa Salt, MD- he will communicate plan with family.

## 2020-01-24 ENCOUNTER — Ambulatory Visit
Admission: RE | Admit: 2020-01-24 | Discharge: 2020-01-24 | Disposition: A | Discharge status: Home / Self Care | Payer: Medicare Other | Source: Ambulatory Visit | Attending: Family Medicine | Admitting: Family Medicine

## 2020-01-24 ENCOUNTER — Other Ambulatory Visit: Payer: Self-pay

## 2020-01-24 ENCOUNTER — Inpatient Hospital Stay: Admission: RE | Admit: 2020-01-24 | Payer: Medicare Other | Source: Ambulatory Visit

## 2020-01-24 DIAGNOSIS — H4711 Papilledema associated with increased intracranial pressure: Secondary | ICD-10-CM | POA: Insufficient documentation

## 2020-01-24 DIAGNOSIS — H4710 Unspecified papilledema: Secondary | ICD-10-CM

## 2020-01-24 MED ORDER — GADOBUTROL 1 MMOL/ML IV SOLN
9.0000 mL | Freq: Once | INTRAVENOUS | Status: AC | PRN
  Administered 2020-01-24: 9 mL via INTRAVENOUS

## 2020-01-24 NOTE — Telephone Encounter (Signed)
See below

## 2020-01-24 NOTE — Addendum Note (Signed)
Addended by: Marin Olp on: 01/24/2020 10:54 AM   Modules accepted: Orders

## 2020-01-24 NOTE — Telephone Encounter (Signed)
Congrats! Thanks for update as well. We ended up getting MRI as could get this quickly with [facility] per our referral coordinator. Let me know who you would want me to forward future correspondence too. Thanks so much

## 2020-01-24 NOTE — Telephone Encounter (Signed)
This has been added per [place].

## 2020-01-24 NOTE — Telephone Encounter (Signed)
Team- can we add the MRV to the MRI- I ordered this as stat- hopefully they can add at the visit

## 2020-01-24 NOTE — Telephone Encounter (Signed)
Can you add MRV (venogram) to the imaging studies since he is already going for MRI?  With his papilledema, need to make sure there is no venous sinus thrombosis, especially being on IVIG.   I'll keep an eye on his studies. We offered an appointment tomorrow, but he preferred to see me on Monday.  I'll keep you posted. Thanks!

## 2020-01-25 ENCOUNTER — Encounter (HOSPITAL_COMMUNITY)
Admission: RE | Disposition: A | Discharge status: Home / Self Care | Payer: Self-pay | Source: Home / Self Care | Attending: Gastroenterology

## 2020-01-25 ENCOUNTER — Other Ambulatory Visit: Payer: Self-pay

## 2020-01-25 ENCOUNTER — Encounter (HOSPITAL_COMMUNITY): Payer: Self-pay | Admitting: Gastroenterology

## 2020-01-25 ENCOUNTER — Other Ambulatory Visit: Payer: Self-pay | Admitting: Neurology

## 2020-01-25 ENCOUNTER — Ambulatory Visit (HOSPITAL_COMMUNITY)
Admission: RE | Admit: 2020-01-25 | Discharge: 2020-01-25 | Disposition: A | Discharge status: Home / Self Care | Payer: Medicare Other | Attending: Gastroenterology | Admitting: Gastroenterology

## 2020-01-25 DIAGNOSIS — Z820 Family history of epilepsy and other diseases of the nervous system: Secondary | ICD-10-CM | POA: Insufficient documentation

## 2020-01-25 DIAGNOSIS — Z803 Family history of malignant neoplasm of breast: Secondary | ICD-10-CM | POA: Diagnosis not present

## 2020-01-25 DIAGNOSIS — Z7952 Long term (current) use of systemic steroids: Secondary | ICD-10-CM | POA: Insufficient documentation

## 2020-01-25 DIAGNOSIS — Z7984 Long term (current) use of oral hypoglycemic drugs: Secondary | ICD-10-CM | POA: Insufficient documentation

## 2020-01-25 DIAGNOSIS — Z87442 Personal history of urinary calculi: Secondary | ICD-10-CM | POA: Diagnosis not present

## 2020-01-25 DIAGNOSIS — N400 Enlarged prostate without lower urinary tract symptoms: Secondary | ICD-10-CM | POA: Diagnosis not present

## 2020-01-25 DIAGNOSIS — Z7982 Long term (current) use of aspirin: Secondary | ICD-10-CM | POA: Insufficient documentation

## 2020-01-25 DIAGNOSIS — K648 Other hemorrhoids: Secondary | ICD-10-CM | POA: Diagnosis not present

## 2020-01-25 DIAGNOSIS — Z79899 Other long term (current) drug therapy: Secondary | ICD-10-CM | POA: Insufficient documentation

## 2020-01-25 DIAGNOSIS — E785 Hyperlipidemia, unspecified: Secondary | ICD-10-CM | POA: Diagnosis not present

## 2020-01-25 DIAGNOSIS — G8929 Other chronic pain: Secondary | ICD-10-CM | POA: Insufficient documentation

## 2020-01-25 DIAGNOSIS — Z86010 Personal history of colonic polyps: Secondary | ICD-10-CM | POA: Diagnosis not present

## 2020-01-25 DIAGNOSIS — Q438 Other specified congenital malformations of intestine: Secondary | ICD-10-CM | POA: Diagnosis not present

## 2020-01-25 DIAGNOSIS — Z1211 Encounter for screening for malignant neoplasm of colon: Secondary | ICD-10-CM | POA: Diagnosis not present

## 2020-01-25 DIAGNOSIS — K644 Residual hemorrhoidal skin tags: Secondary | ICD-10-CM | POA: Diagnosis not present

## 2020-01-25 DIAGNOSIS — K635 Polyp of colon: Secondary | ICD-10-CM

## 2020-01-25 DIAGNOSIS — R519 Headache, unspecified: Secondary | ICD-10-CM | POA: Insufficient documentation

## 2020-01-25 DIAGNOSIS — Z800 Family history of malignant neoplasm of digestive organs: Secondary | ICD-10-CM | POA: Diagnosis not present

## 2020-01-25 DIAGNOSIS — M549 Dorsalgia, unspecified: Secondary | ICD-10-CM | POA: Diagnosis not present

## 2020-01-25 DIAGNOSIS — Z888 Allergy status to other drugs, medicaments and biological substances status: Secondary | ICD-10-CM | POA: Diagnosis not present

## 2020-01-25 DIAGNOSIS — Z955 Presence of coronary angioplasty implant and graft: Secondary | ICD-10-CM | POA: Diagnosis not present

## 2020-01-25 DIAGNOSIS — G7289 Other specified myopathies: Secondary | ICD-10-CM | POA: Diagnosis not present

## 2020-01-25 DIAGNOSIS — Z981 Arthrodesis status: Secondary | ICD-10-CM | POA: Diagnosis not present

## 2020-01-25 DIAGNOSIS — I2510 Atherosclerotic heart disease of native coronary artery without angina pectoris: Secondary | ICD-10-CM | POA: Insufficient documentation

## 2020-01-25 DIAGNOSIS — G7249 Other inflammatory and immune myopathies, not elsewhere classified: Secondary | ICD-10-CM

## 2020-01-25 HISTORY — PX: POLYPECTOMY: SHX5525

## 2020-01-25 HISTORY — PX: COLONOSCOPY: SHX5424

## 2020-01-25 LAB — GLUCOSE, CAPILLARY: Glucose-Capillary: 139 mg/dL — ABNORMAL HIGH (ref 70–99)

## 2020-01-25 SURGERY — COLONOSCOPY
Anesthesia: Moderate Sedation

## 2020-01-25 MED ORDER — STERILE WATER FOR IRRIGATION IR SOLN
Status: DC | PRN
  Administered 2020-01-25: 2.5 mL

## 2020-01-25 MED ORDER — SODIUM CHLORIDE 0.9 % IV SOLN: INTRAVENOUS | Status: DC

## 2020-01-25 MED ORDER — MIDAZOLAM HCL 5 MG/5ML IJ SOLN
INTRAMUSCULAR | Status: AC
  Filled 2020-01-25: qty 10

## 2020-01-25 MED ORDER — MIDAZOLAM HCL 5 MG/5ML IJ SOLN
INTRAMUSCULAR | Status: DC | PRN
  Administered 2020-01-25 (×2): 2 mg via INTRAVENOUS

## 2020-01-25 MED ORDER — MEPERIDINE HCL 100 MG/ML IJ SOLN
INTRAMUSCULAR | Status: AC
  Filled 2020-01-25: qty 2

## 2020-01-25 MED ORDER — MEPERIDINE HCL 100 MG/ML IJ SOLN
INTRAMUSCULAR | Status: DC | PRN
  Administered 2020-01-25 (×2): 25 mg via INTRAVENOUS

## 2020-01-25 NOTE — Discharge Instructions (Signed)
You had 1 small polyp removed. You have external and internal hemorrhoids.   DRINK WATER TO KEEP YOUR URINE LIGHT YELLOW.  FOLLOW A HIGH FIBER DIET. AVOID ITEMS THAT CAUSE BLOATING & GAS. SEE INFO BELOW.   USE PREPARATION H FOUR TIMES  A DAY IF NEEDED TO RELIEVE RECTAL PAIN/PRESSURE/BLEEDING.   YOUR BIOPSY RESULTS WILL BE BACK IN 5 BUSINESS DAYS.  Next colonoscopy in 5-10 years.    Colonoscopy Care After Read the instructions outlined below and refer to this sheet in the next week. These discharge instructions provide you with general information on caring for yourself after you leave the hospital. While your treatment has been planned according to the most current medical practices available, unavoidable complications occasionally occur. If you have any problems or questions after discharge, call DR. FIELDS, [phone].  ACTIVITY  You may resume your regular activity, but move at a slower pace for the next 24 hours.   Take frequent rest periods for the next 24 hours.   Walking will help get rid of the air and reduce the bloated feeling in your belly (abdomen).   No driving for 24 hours (because of the medicine (anesthesia) used during the test).   You may shower.   Do not sign any important legal documents or operate any machinery for 24 hours (because of the anesthesia used during the test).    NUTRITION  Drink plenty of fluids.   You may resume your normal diet as instructed by your doctor.   Begin with a light meal and progress to your normal diet. Heavy or fried foods are harder to digest and may make you feel sick to your stomach (nauseated).   Avoid alcoholic beverages for 24 hours or as instructed.    MEDICATIONS  You may resume your normal medications.   WHAT YOU CAN EXPECT TODAY  Some feelings of bloating in the abdomen.   Passage of more gas than usual.   Spotting of blood in your stool or on the toilet paper  .  IF YOU HAD POLYPS REMOVED  DURING THE COLONOSCOPY:  Eat a soft diet IF YOU HAVE NAUSEA, BLOATING, ABDOMINAL PAIN, OR VOMITING.    FINDING OUT THE RESULTS OF YOUR TEST Not all test results are available during your visit. DR. Oneida Alar WILL CALL YOU WITHIN 14 DAYS OF YOUR PROCEDUE WITH YOUR RESULTS. Do not assume everything is normal if you have not heard from DR. FIELDS, CALL HER OFFICE AT [phone].  SEEK IMMEDIATE MEDICAL ATTENTION AND CALL THE OFFICE: [phone] IF:  You have more than a spotting of blood in your stool.   Your belly is swollen (abdominal distention).   You are nauseated or vomiting.   You have a temperature over 101F.   You have abdominal pain or discomfort that is severe or gets worse throughout the day.   High-Fiber Diet A high-fiber diet changes your normal diet to include more whole grains, legumes, fruits, and vegetables. Changes in the diet involve replacing refined carbohydrates with unrefined foods. The calorie level of the diet is essentially unchanged. The Dietary Reference Intake (recommended amount) for adult males is 38 grams per day. For adult females, it is 25 grams per day. Pregnant and lactating women should consume 28 grams of fiber per day. Fiber is the intact part of a plant that is not broken down during digestion. Functional fiber is fiber that has been isolated from the plant to provide a beneficial effect in the body.  PURPOSE Increase stool  bulk.  Ease and regulate bowel movements.  Lower cholesterol.  REDUCE RISK OF COLON CANCER  INDICATIONS THAT YOU NEED MORE FIBER Constipation and hemorrhoids.  Uncomplicated diverticulosis (intestine condition) and irritable bowel syndrome.  Weight management.  As a protective measure against hardening of the arteries (atherosclerosis), diabetes, and cancer.   GUIDELINES FOR INCREASING FIBER IN THE DIET Start adding fiber to the diet slowly. A gradual increase of about 5 more grams (2 servings of most fruits or vegetables)  per day is best. Too rapid an increase in fiber may result in constipation, flatulence, and bloating.  Drink enough water and fluids to keep your urine clear or pale yellow. Water, juice, or caffeine-free drinks are recommended. Not drinking enough fluid may cause constipation.  Eat a variety of high-fiber foods rather than one type of fiber.  Try to increase your intake of fiber through using high-fiber foods rather than fiber pills or supplements that contain small amounts of fiber.  The goal is to change the types of food eaten. Do not supplement your present diet with high-fiber foods, but replace foods in your present diet.    Polyps, Colon  A polyp is extra tissue that grows inside your body. Colon polyps grow in the large intestine. The large intestine, also called the colon, is part of your digestive system. It is a long, hollow tube at the end of your digestive tract where your body makes and stores stool. Most polyps are not dangerous. They are benign. This means they are not cancerous. But over time, some types of polyps can turn into cancer. Polyps that are smaller than a pea are usually not harmful. But larger polyps could someday become or may already be cancerous. To be safe, doctors remove all polyps and test them.   WHO GETS POLYPS? Anyone can get polyps, but certain people are more likely than others. You may have a greater chance of getting polyps if:  You are over 50.   You have had polyps before.   Someone in your family has had polyps.   Someone in your family has had cancer of the large intestine.   Find out if someone in your family has had polyps. You may also be more likely to get polyps if you:   Eat a lot of fatty foods   Smoke   Drink alcohol   Do not exercise  Eat too much   PREVENTION There is not one sure way to prevent polyps. You might be able to lower your risk of getting them if you:  Eat more fruits and vegetables and less fatty food.   Do not  smoke.   Avoid alcohol.   Exercise every day.   Lose weight if you are overweight.   Eating more calcium and folate can also lower your risk of getting polyps. Some foods that are rich in calcium are milk, cheese, and broccoli. Some foods that are rich in folate are chickpeas, kidney beans, and spinach.
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
## 2020-01-25 NOTE — Op Note (Signed)
[facility] Patient Name: Stanley Boyd Procedure Date: 01/25/2020 9:26 AM MRN: [id number] Date of Birth: 1951-02-23 Attending MD: Barney Drain MD, MD CSN: [id number] Age: 69 Admit Type: Outpatient Procedure:                Colonoscopy WITH COLD SNARE POLYPECTOMY Indications:              Personal history of colonic polyps Providers:                Barney Drain MD, MD, Lurline Del, RN, Nelma Rothman,                            Technician Referring MD:             Brayton Mars. Hunter MD Medicines:                Meperidine 50 mg IV, Midazolam 4 mg IV Complications:            No immediate complications. Estimated Blood Loss:     Estimated blood loss was minimal. Procedure:                Pre-Anesthesia Assessment:                           - Prior to the procedure, a History and Physical                            was performed, and patient medications and                            allergies were reviewed. The patient's tolerance of                            previous anesthesia was also reviewed. The risks                            and benefits of the procedure and the sedation                            options and risks were discussed with the patient.                            All questions were answered, and informed consent                            was obtained. Prior Anticoagulants: The patient has                            taken no previous anticoagulant or antiplatelet                            agents except for aspirin. ASA Grade Assessment: II                            - A patient with mild systemic disease. After  reviewing the risks and benefits, the patient was                            deemed in satisfactory condition to undergo the                            procedure. After obtaining informed consent, the                            colonoscope was passed under direct vision.                            Throughout the procedure, the patient's  blood                            pressure, pulse, and oxygen saturations were                            monitored continuously. The [id number]                            scope was introduced through the anus and advanced                            to the the cecum, identified by appendiceal orifice                            and ileocecal valve. The colonoscopy was somewhat                            difficult due to a tortuous colon. Successful                            completion of the procedure was aided by                            straightening and shortening the scope to obtain                            bowel loop reduction and COLOWRAP. The patient                            tolerated the procedure well. The quality of the                            bowel preparation was good. The ileocecal valve,                            appendiceal orifice, and rectum were photographed. Scope In: 9:43:27 AM Scope Out: 9:57:32 AM Scope Withdrawal Time: 0 hours 12 minutes 15 seconds  Total Procedure Duration: 0 hours 14 minutes 5 seconds  Findings:      A 3 mm polyp was found in the sigmoid colon. The polyp was sessile. The  polyp was removed with a cold snare. Resection and retrieval were       complete.      External and internal hemorrhoids were found.      The recto-sigmoid colon and sigmoid colon were mildly tortuous. Impression:               - One 3 mm polyp in the sigmoid colon, removed with                            a cold snare. Resected and retrieved.                           - External and internal hemorrhoids.                           - Tortuous colon. Moderate Sedation:      Moderate (conscious) sedation was administered by the endoscopy nurse       and supervised by the endoscopist. The following parameters were       monitored: oxygen saturation, heart rate, blood pressure, and response       to care. Total physician intraservice time was 26  minutes. Recommendation:           - Patient has a contact number available for                            emergencies. The signs and symptoms of potential                            delayed complications were discussed with the                            patient. Return to normal activities tomorrow.                            Written discharge instructions were provided to the                            patient.                           - High fiber diet.                           - Continue present medications.                           - Await pathology results.                           - Repeat colonoscopy in 5-10 years for surveillance. Procedure Code(s):        --- Professional ---                           [phone], Colonoscopy, flexible; with removal of  tumor(s), polyp(s), or other lesion(s) by snare                            technique                           99153, Moderate sedation; each additional 15                            minutes intraservice time                           G0500, Moderate sedation services provided by the                            same physician or other qualified health care                            professional performing a gastrointestinal                            endoscopic service that sedation supports,                            requiring the presence of an independent trained                            observer to assist in the monitoring of the                            patient's level of consciousness and physiological                            status; initial 15 minutes of intra-service time;                            patient age 83 years or older (additional time may                            be reported with [id number], as appropriate) Diagnosis Code(s):        --- Professional ---                           K63.5, Polyp of colon                           K64.8, Other hemorrhoids                            Z86.010, Personal history of colonic polyps                           Q43.8, Other specified congenital malformations of                            intestine CPT copyright 2019 American  Medical Association. All rights reserved. The codes documented in this report are preliminary and upon coder review may  be revised to meet current compliance requirements. Barney Drain, MD Barney Drain MD, MD 01/25/2020 10:07:22 AM This report has been signed electronically. Number of Addenda: 0

## 2020-01-26 LAB — CK: Total CK: 352 U/L — ABNORMAL HIGH (ref 41–331)

## 2020-01-28 ENCOUNTER — Ambulatory Visit (INDEPENDENT_AMBULATORY_CARE_PROVIDER_SITE_OTHER): Payer: Medicare Other | Admitting: Neurology

## 2020-01-28 ENCOUNTER — Other Ambulatory Visit: Payer: Self-pay

## 2020-01-28 ENCOUNTER — Encounter: Payer: Self-pay | Admitting: Neurology

## 2020-01-28 VITALS — Resp 20 | Ht 71.0 in | Wt 206.0 lb

## 2020-01-28 DIAGNOSIS — E538 Deficiency of other specified B group vitamins: Secondary | ICD-10-CM | POA: Diagnosis not present

## 2020-01-28 DIAGNOSIS — G7249 Other inflammatory and immune myopathies, not elsewhere classified: Secondary | ICD-10-CM

## 2020-01-28 DIAGNOSIS — H4710 Unspecified papilledema: Secondary | ICD-10-CM

## 2020-01-28 LAB — SURGICAL PATHOLOGY

## 2020-01-28 NOTE — Progress Notes (Signed)
Follow-up Visit   Date: 01/28/20   Dock Baccam MRN: [id number] DOB: June 15, 1951   Interim History: Stanley Boyd is a 69 y.o. 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  The patient was accompanied to the clinic by wife who also provides collateral information.    History of present illness: Early April 2020, he began having difficulty walking, which progressed into inability to raise his legs even to climb stairs. Around the same time, he developed a skin rash of the chest and forearm.  He was evaluated by his PCP who noted 4-/5 muscle strength in the legs and markedly elevated [place] at [address].  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  MRI left femur was most notable with diffuse, symmetric muscle edema and enhancement in the proximal hip girdle muscles. CK peaked at [address]. Left rectus femoris muscle biopsy and skin biopsy was performed.  He was emperically started on prednisone 21m due to high clinical suspicion for inflammatory myopathy, despite normal inflammatory/autoimmune markers (see below).  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  Skin biopsy showed benign lesion with fibrosis and mixed superficial and deep inflammation.  He has a history of CAD, followed by Dr. GEinar Gip  He was previously on statin therapy for ~ 2 years, but due to elevated liver enzymes, he was switched Praluent in 2019.  His LFTs have been elevated in the past, [facility], [facility] 61*, ALT 90*, alk  phos 126*.  He denies history of myalgias or cramps.    In late May, he established care with me and checked HMGCR antibodies which returned markedly elevated. In July, he started IVIG nd Cellcept 5040mBID, which has allowed tapering of prednisone.    [facility], who agreed with management plan.  . Marland KitchenUPDATE 01/28/2020:  He is here with new complaints of bilateral papilledema.  He saw his eye doctor with complaints of a black spot in his vision and was noted to have bilateral papilledema, mild and worse on the left, along with visual field deficits involving the left eye.  MRI/V brain wwo contrast is normal. He denies headache, worsening vision changes, dizziness, or double vision. The only recent medication changes include adding Zetia and Vascepa for hyperlipidemia a week prior to symptom onset.   With respect to his myopathy, he does not report any weakness.  His CK are slightly higher, but stable (454 > 352). He has baseline right hand weakness from cervical surgery.  He remains on prednisone 1551mCellcept 500m57mD, and IVIG 1g/kg every 28 days.      Medications:  Current Outpatient Medications on File Prior to Visit  Medication Sig Dispense Refill  . aspirin 81 MG chewable tablet Chew 1 tablet (81 mg total) by mouth at bedtime.    . Carboxymethylcellulose Sodium (REFRESH TEARS OP) Apply 1 drop to eye.    . cyanocobalamin (,VITAMIN B-12,) 1000 MCG/ML injection Inject IM daily for 7 days, then weekly for 4 weeks, then monthly (Patient taking differently:  Inject 1,000 mcg into the muscle every 30 (thirty) days. ) 6 mL 0  . ezetimibe (ZETIA) 10 MG tablet Take 1 tablet (10 mg total) by mouth daily after supper. 30 tablet 2  . finasteride (PROSCAR) 5 MG tablet Take 5 mg by mouth at bedtime.     . fluticasone (FLONASE) 50 MCG/ACT nasal spray Place 2 sprays into both nostrils daily as needed for allergies or rhinitis.    Marland Kitchen  icosapent Ethyl (VASCEPA) 1 g capsule Take 2 capsules (2 g total) by mouth 2 (two) times daily. 120 capsule 6  . Immune Globulin 10% (IMMUNE GLOBULIN 10%) 10G/19m (10,0073m100mL) SOLN Inject 1 g/kg into the vein once. 75 g every 28 days for 1 consecutive days     . metFORMIN (GLUCOPHAGE) 500 MG tablet Take 1-2 tablets (500-1,000 mg total) by mouth 2 (two) times daily with a meal. Take 100040mn morning with breakfast and 500m58mth dinner 270 tablet 3  . mycophenolate (CELLCEPT) 500 MG tablet Take 1 tablet by mouth twice daily (Patient taking differently: Take 500 mg by mouth 2 (two) times daily. ) 180 tablet 3  . nitroGLYCERIN (NITROSTAT) 0.4 MG SL tablet Place 1 tablet (0.4 mg total) under the tongue every 5 (five) minutes as needed for chest pain. 25 tablet 4  . pantoprazole (PROTONIX) 40 MG tablet Take 1 tablet (40 mg total) by mouth daily. 90 tablet 3  . PRALUENT 150 MG/ML SOAJ INJECT CONTENTS OF 1 PEN SUBCUTANEOUSLY EVERY TWO WEEKS (Patient taking differently: Inject 150 mg into the skin every 14 (fourteen) days. ) 2 mL 6  . predniSONE (DELTASONE) 10 MG tablet Take 1.5 tablets (15 mg total) by mouth daily with breakfast. (Patient taking differently: Take 15 mg by mouth daily. ) 150 tablet 2  . Syringe/Needle, Disp, (SYRINGE 3CC/22GX1") 22G X 1" 3 ML MISC Use with B12 injections 10 each 0  . tamsulosin (FLOMAX) 0.4 MG CAPS capsule Take 0.4 mg by mouth at bedtime.     No current facility-administered medications on file prior to visit.    Allergies:  Allergies  Allergen Reactions  . Statins Other (See Comments)    Myositis Myositis    Review of Systems:  CONSTITUTIONAL: No fevers, chills, night sweats, or weight loss.  EYES: +visual changes or eye pain ENT: No hearing changes.  No history of nose bleeds.   RESPIRATORY: No cough, wheezing and shortness of breath.   CARDIOVASCULAR: Negative for chest pain, and palpitations.   GI: Negative for abdominal discomfort, blood in stools or  black stools.  No recent change in bowel habits.   GU:  No history of incontinence.   MUSCLOSKELETAL: No history of joint pain or swelling.  No myalgias.   SKIN: Negative for lesions, rash, and itching.   ENDOCRINE: Negative for cold or heat intolerance, polydipsia or goiter.   PSYCH:  No depression or anxiety symptoms.   NEURO: As Above.   Vital Signs:  Resp 20   Ht 5' 11" (1.803 m)   Wt 206 lb (93.4 kg)   SpO2 97%   BMI 28.73 kg/m   Neurological Exam: MENTAL STATUS including orientation to time, place, person, recent and remote memory, attention span and concentration, language, and fund of knowledge is normal.  Speech is not dysarthric.  CRANIAL NERVES: Fundoscopic exam shows mild bilateral papilledema.  There is left-central visual field deficit in the left eye only.  Visual field in the right eye is normal Pupils equal round and reactive to light.  Normal conjugate, extra-ocular eye movements in all directions of gaze.  No ptosis. Face is symmetric. Facial muscles are intact 5/5.  MOTOR:  No atrophy, fasciculations or abnormal movements.  No pronator drift.  Neck flexion and extension is 5/5. Upper Extremity:  Right  Left  Deltoid  5/5   5/5   Biceps  5/5   5/5   Triceps  5/5   5/5   Infraspinatus 5/5  5/5  Medial pectoralis 5/5  5/5  Wrist extensors  5-/5   5/5   Wrist flexors  5-/5   5/5   Finger extensors  4/5   5/5   Finger flexors  4/5   5/5   Dorsal interossei  4/5   5/5   Abductor pollicis  4/5   5/5   Tone (Ashworth scale)  0  0   Lower Extremity:  Right  Left  Hip flexors  5/5   5/5   Hip extensors  5/5   5/5   Adductor 5/5  5/5  Abductor 5/5  5/5  Knee flexors  5/5   5/5   Knee extensors  5/5   5/5   Dorsiflexors  5/5   5/5   Plantarflexors  5/5   5/5   Toe extensors  5/5   5/5   Toe flexors  5/5   5/5   Tone (Ashworth scale)  0  0   COORDINATION/GAIT:   Gait narrow based and stable. He is easily able to perform squat and stand up without  difficulty.  Data: MRI cervical spine without contrast 02/17/2019: 1. 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 2. Solid anterior fusion at C4-5 and C5-6 without spinal stenosis at these levels. 3. Adjacent segment degenerative changes with moderate spinal stenosis at C6-7, mild-to-moderate spinal stenosis at C3-4, and mild-to-moderate neural foraminal stenosis at both levels.  MRI lumbar spine 02/17/2019: 1. Relatively diffuse lumbar disc and facet degeneration without significant spinal stenosis. 2. Multilevel neural foraminal stenosis, moderate on the left at L5-S1. 3. Normal appearance of the conus medullaris and cauda equina.  MRI thoracic spine without contrast 02/17/2019: 1. Mild thoracic disc and facet degeneration without evidence of neural impingement. 2. Minimal prominence of the central canal of the mid to lower thoracic spinal cord without syrinx.  MRI left femur with and without contrast 02/18/2019: 1. Abnormal diffuse muscular edema and enhancement involving various muscles of the lower pelvis and thigh as detailed above. The coronal images indicate that this is a grossly symmetric process.  Labs 02/17/2019:  Aldolase 122, anti-Jo neg, ANA neg, anti-Scl 70 neg, CRP 1.1, ESR 12, Ehrlichia antibody neg, Lyme neg, acute hepatitis panel neg, HIV neg, VGCC neg, paraneoplastic antibody panel neg 02/15/2019:  vitamin B12 162*, TSH 4.86*, free T4 0.78  Labs 08/2014 at [facility]:  CK 219, AST 61*, ALT 90*, alk phos 126*  Left rectus femorus muscle biopsy 02/20/2019:  Active necrotizing inflammatory myopathy. Right chest wall skin biopsy:  Benign skin with fibrosis and mixed superficial and deep inflammation  Neuromuscular Lab - [facility] 03/20/2019:  Myopathy 2 panel - HMGCR 36,000 (normal <2500)  Component CK Total  Latest Ref Rng & Units 41 - 331 U/L  09/11/2019 209  10/18/2019 267  11/26/2019 318  12/24/2019 175  01/09/2020 454 (H)    01/25/2020         352*   IMPRESSION/PLAN: 1.  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  No evidence of structural intracranial pathology on MRI/V brain.  Medication-induced intracranial hypertension is considered, although I have not seen it with corticosteroid use.  Literature search shows variable data and says that steroid-induced intracranial hypertension can occur with the steroid withdrawal as well as medication itself.  I have discussed management options with patient's wife and son (Dr. Thersa Salt) and addressed pros and cons of each.  At this point, his myopathy is very stable and goal is to preserve vision. Therefore, I will slowly taper his prednisone, in the event, this is contributing to his papilledema.  He will monitor for signs of weakness.  - Check LP for opening pressure, if elevated, start diamox 539m BID   [facility]illed[facility] and visual field testing in 2-3 weeks    - Taper prednisone as noted below in #2  2.  Anti-HMGCR necrotizing immune-mediated myopathy (dx 02/2019), clinically stable with no signs of weakness.  CK have slightly trended up into 300-400s, and I expect there to be some fluctuating in his CKs as his medications are being adjusted.  As long as his CK stays < 1000 and there is no weakness, OK to continue to monitor.  If however, CK trend > 1000 and/or he develops weakness, IVIG will need to be adjusted to every 21 days (currently q28d).  Because of papilledema and possibility of corticosteroids causing this, I will adjust his medications as noted below: 1. Increase Cellcept to 7543mtwice daily (take 1.5 tab twice daily) 2. Taper prednisione to 1031m 1 week, then 5mg48m1 week, then 2.5mg 80m week, then 2.5mg M61mx 1 week, then stop.   3. My concern with tapering prednisone is that his myopathy may get worse and CK start to rise.  Hopefully, increasing Cellcept will help; however, if his CKs do start to rise > 1000 OR he gets weaker,  then his IVIG will need to be adjusted to every 21 days. 4. Continue to monitor CBC, CMP, and CK on the 15th of each month. 5. If his myopathy gets markedly worse, he will need loading dose of IVIG 2g/kg and continue maintenance of 1g/kg q21   3. Vitamin B12 deficiency, continue monthly injections.  Return to clinic in 4 months  Thank you for allowing me to participate in patient's care.  If I can answer any additional questions, I would be pleased to do so.    Sincerely,    Donika K. Patel,Posey Pronto

## 2020-01-28 NOTE — Patient Instructions (Addendum)
We will refer you for lumbar puncture and to [facility]  Lumbar Puncture A lumbar puncture, also called a spinal tap, is a procedure that is done to remove a small amount of the fluid that surrounds the brain and spinal cord (cerebrospinal fluid, CSF). The fluid is then examined in the lab. This procedure may be done to:  Help diagnose various problems, such as meningitis, encephalitis, multiple sclerosis, and other infections.  Remove fluid and relieve pressure that occurs with certain types of headaches.  Look for bleeding within the brain and spinal cord areas (central nervous system).  Place medicine into the spinal fluid. Tell a health care provider about:  Any allergies you have.  All medicines you are taking, including vitamins, herbs, eye drops, creams, and over-the-counter medicines like aspirin or NSAIDs.  Any problems you or family members have had with anesthetic medicines.  Any blood disorders you have.  Any surgeries you have had.  Any medical conditions you have.  Whether you are pregnant or may be pregnant. What are the risks? Generally, this is a safe procedure. However, problems may occur, including:  Infection at the insertion site that can spread to the bone or spinal fluid.  Bleeding.  Spinal headache. This is a severe headache that occurs when there is a leak of spinal fluid.  Leakage of CSF.  Allergic reactions to medicines or dyes.  Damage to other structures or organs. What happens before the procedure? Staying hydrated Follow instructions from your health care provider about hydration, which may include:  Up to 2 hours before the procedure - you may continue to drink clear liquids, such as water, clear fruit juice, black coffee, and plain tea. Eating and drinking restrictions Follow instructions from your health care provider about eating and drinking. If you will be given a medicine to help you relax (sedative), these  instructions may include:  8 hours before the procedure - stop eating heavy meals or foods such as meat, fried foods, or fatty foods.  6 hours before the procedure - stop eating light meals or foods, such as toast or cereal.  6 hours before the procedure - stop drinking milk or drinks that contain milk.  2 hours before the procedure - stop drinking clear liquids. Medicines  Ask your health care provider about: ? Changing or stopping your regular medicines. This is especially important if you are taking diabetes medicines or blood thinners. ? Taking medicines such as aspirin and ibuprofen. These medicines can thin your blood. Do not take these medicines before your procedure if your health care provider instructs you not to.  You may be given antibiotic medicine to help prevent infection. If so, take the antibiotic as told by your health care provider. General instructions  You may have a blood sample taken.  You may be asked to shower with a germ-killing soap.  Ask your health care provider how your surgical site will be marked or identified.  Plan to have someone take you home from the hospital or clinic. This is especially important if you will be given a sedative.  If you will be going home right after the procedure, plan to have someone with you for 24 hours. What happens during the procedure?   You may lie down on your side with your knees bent, or you may sit with your head resting on a pillow on your lap. ? How you are positioned depends on your age and size. ? You will be positioned so that  the spaces between the bones of the spine (vertebrae) are as wide as possible. This will make it easier to pass the needle into the spinal canal.  The skin on your lower back (lumbar region) will be cleaned.  You will be given an injection of medicine to numb your lower back area (local anesthetic).  You may be given pain medicine or a sedative.  A small needle will be inserted into  your lower back until it enters the space that contains the cerebrospinal fluid. The needle will not enter the spinal cord.  Fluid will be collected into tubes. It will be sent to a lab for examination.  The needle will be withdrawn, and a bandage (dressing) will be placed over the area. The procedure may vary among health care providers and hospitals. What happens after the procedure?  Your blood pressure, heart rate, breathing rate, and blood oxygen level will be monitored until any medicines you were given have worn off.  You may need to stay lying down for a while.  You will need to drink plenty of fluids and caffeine to help prevent a headache.  Do not drive for 24 hours if you received a sedative. Summary  A lumbar puncture, also called a spinal tap, is a procedure that is done to remove a small amount of the cerebrospinal fluid, CSF. This may be done to help diagnose a wide variety of conditions.  Before the procedure, tell your health care provider about all medicines you are taking, including vitamins, herbs, eye drops, creams, and over-the-counter medicines.  Before the procedure, ask your health care provider about changing or stopping your regular medicines. This is especially important if you are taking blood thinners.  Your lower back will be numbed with an injection before the needle is placed into your spinal canal.  After the procedure, you will lie down for a while and you will drink plenty of fluids. This information is not intended to replace advice given to you by your health care provider. Make sure you discuss any questions you have with your health care provider. Document Revised: 11/10/2016 Document Reviewed: 11/10/2016 Elsevier Patient Education  2020 [employer].

## 2020-01-30 ENCOUNTER — Other Ambulatory Visit: Payer: Self-pay

## 2020-01-30 DIAGNOSIS — G7249 Other inflammatory and immune myopathies, not elsewhere classified: Secondary | ICD-10-CM

## 2020-01-30 DIAGNOSIS — R202 Paresthesia of skin: Secondary | ICD-10-CM

## 2020-01-30 DIAGNOSIS — H4710 Unspecified papilledema: Secondary | ICD-10-CM

## 2020-01-30 MED ORDER — MYCOPHENOLATE MOFETIL 500 MG PO TABS: 750.0000 mg | ORAL_TABLET | Freq: Two times a day (BID) | ORAL | 3 refills | Status: DC

## 2020-01-30 NOTE — Progress Notes (Signed)
Orders placed and referral sent to Dr.Bradley Bowen

## 2020-02-01 ENCOUNTER — Other Ambulatory Visit: Payer: Self-pay

## 2020-02-01 ENCOUNTER — Ambulatory Visit
Admission: RE | Admit: 2020-02-01 | Discharge: 2020-02-01 | Disposition: A | Discharge status: Home / Self Care | Payer: Medicare Other | Source: Ambulatory Visit | Attending: Neurology | Admitting: Neurology

## 2020-02-01 ENCOUNTER — Telehealth: Payer: Self-pay | Admitting: Neurology

## 2020-02-01 ENCOUNTER — Ambulatory Visit: Payer: Medicare Other | Admitting: Neurology

## 2020-02-01 VITALS — BP 122/48 | HR 54

## 2020-02-01 DIAGNOSIS — H4710 Unspecified papilledema: Secondary | ICD-10-CM

## 2020-02-01 NOTE — Discharge Instructions (Signed)

## 2020-02-01 NOTE — Telephone Encounter (Signed)
Called and informed patient that his CSF opening pressure was normal (74mmHg).  Prelim CSF results are also within normal limits, except protein is mildly elevated at 65 (normal < 60).  IgG index, OCB, and CSF ACE is pending.  Given normal pressure and lack of intracranial pathology on imaging, I wonder if he has true papilledema or if this could be pseudopapilledema, but it still would not explain his visual field deficits.  He is scheduled to see ophthalmology (Dr. Valetta Close at [facility] ophthalmology) in two weeks.  If his vision changes, he will need sooner appointment.   I will continue the plan to taper his prednisone as previously outlined.  He will remain on Cellcept 750mg  BID and IVIG 1g/kg every 28 days. Follow-up with remaining CSF studies No indication for diamox given normal CSF opening pressure  Latrell Potempa K. Posey Pronto, DO

## 2020-02-01 NOTE — Progress Notes (Signed)
Blood withdrawn from pts Right AC for blood work to go with LP. 1 attempt.  Pt tolerated well. Gauze dressing applied after.

## 2020-02-04 ENCOUNTER — Telehealth: Payer: Self-pay | Admitting: Neurology

## 2020-02-04 NOTE — Telephone Encounter (Signed)
Faxed all information

## 2020-02-04 NOTE — Telephone Encounter (Signed)
Note that Dr. Posey Pronto was the on call physician who received the page of the results at the time.

## 2020-02-04 NOTE — Telephone Encounter (Signed)
Left message with the after hour service on 02-02-20 @ 12:13 pm  Stat results for patient   Stanley Boyd from [place]   Reference number [id number] N   On call Dr Paged   [facility] to obtain the Stat results asked for Stanley Boyd but Tawni Carnes states that she was unavailable according to [facility] that the CSF draw was done on 02-01-20 @ 8:45 the glucose was 72 and the protein total was 654 the cell count and diff were listed as colorless and clear  Values read back and verified. When asled if any of the other values were listed as critical Tawni Carnes state that they were not listed as critical

## 2020-02-05 LAB — PROTEIN, CSF: Total Protein, CSF: 65 mg/dL — ABNORMAL HIGH (ref 15–60)

## 2020-02-05 LAB — CNS IGG SYNTHESIS RATE, CSF+BLOOD
Albumin Serum: 4.1 g/dL (ref 3.2–4.6)
Albumin, CSF: 36.2 mg/dL (ref 8.0–42.0)
CNS-IgG Synthesis Rate: 2.7 mg/24 h (ref <=3.3)
IgG (Immunoglobin G), Serum: 1330 mg/dL (ref 600–1540)
IgG Total CSF: 6.7 mg/dL (ref 0.8–7.7)
IgG-Index: 0.57 (ref <0.66)

## 2020-02-05 LAB — CSF CELL COUNT WITH DIFFERENTIAL
RBC Count, CSF: 0 cells/uL
WBC, CSF: 4 cells/uL (ref 0–5)

## 2020-02-05 LAB — GLUCOSE, CSF: Glucose, CSF: 72 mg/dL (ref 40–80)

## 2020-02-05 LAB — ANGIOTENSIN CONVERTING ENZYME, CSF: ACE, CSF: 5 U/L (ref <=15)

## 2020-02-05 LAB — MYELIN BASIC PROTEIN, CSF: Myelin Basic Protein: <2 mcg/L (ref 2.0–4.0)

## 2020-02-07 ENCOUNTER — Ambulatory Visit (HOSPITAL_COMMUNITY): Payer: Medicare Other

## 2020-02-11 ENCOUNTER — Telehealth: Payer: Self-pay | Admitting: Family Medicine

## 2020-02-11 DIAGNOSIS — H4710 Unspecified papilledema: Secondary | ICD-10-CM

## 2020-02-11 NOTE — Telephone Encounter (Signed)
Spoke with son. Papilledema stable despite coming off prednisone. Vision worse. He is going to get his father to ER at [place].   I will place neurooptho referral in case needs not addressed by route of ER

## 2020-02-12 ENCOUNTER — Telehealth: Payer: Self-pay | Admitting: Neurology

## 2020-02-12 DIAGNOSIS — H539 Unspecified visual disturbance: Secondary | ICD-10-CM | POA: Insufficient documentation

## 2020-02-12 NOTE — Telephone Encounter (Signed)
I called [facility], gave them all the information over the phone. They will call him within the next hour or two and get him worked in today or tomorrow.

## 2020-02-12 NOTE — Telephone Encounter (Signed)
Perfect thanks

## 2020-02-12 NOTE — Telephone Encounter (Signed)
Received call from ophthalmologist, Dr. Valetta Close regarding [name], a patient of my colleague Dr. Posey Pronto.  Patient with bilateral papilledema.  MRI and MRV of head unremarkable.  LP showed normal opening pressure.  However, he continues to have worsening papilledema and visual decline.  Dr. Valetta Close spoke with Dr. Jolyn Nap, neuro-ophthalmologist at [facility] who recommended hospital admission for IV steroids and further workup.  Agree with plan.

## 2020-02-13 ENCOUNTER — Telehealth: Payer: Self-pay | Admitting: Neurology

## 2020-02-13 MED ORDER — SULFAMETHOXAZOLE-TRIMETHOPRIM 800-160 MG PO TABS
1.00 | ORAL_TABLET | ORAL | Status: DC
Start: 2020-02-14 — End: 2020-02-13

## 2020-02-13 MED ORDER — GENERIC EXTERNAL MEDICATION
1000.00 | Status: DC
Start: 2020-02-15 — End: 2020-02-13

## 2020-02-13 MED ORDER — GLUCOSE 40 % PO GEL
15.00 | ORAL | Status: DC
Start: ? — End: 2020-02-13

## 2020-02-13 MED ORDER — ASPIRIN 81 MG PO CHEW
81.00 | CHEWABLE_TABLET | ORAL | Status: DC
Start: 2020-02-15 — End: 2020-02-13

## 2020-02-13 MED ORDER — MYCOPHENOLATE MOFETIL 250 MG PO CAPS
750.00 | ORAL_CAPSULE | ORAL | Status: DC
Start: 2020-02-14 — End: 2020-02-13

## 2020-02-13 MED ORDER — INSULIN LISPRO 100 UNIT/ML ~~LOC~~ SOLN
2.00 | SUBCUTANEOUS | Status: DC
Start: 2020-02-14 — End: 2020-02-13

## 2020-02-13 MED ORDER — TAMSULOSIN HCL 0.4 MG PO CAPS
0.40 | ORAL_CAPSULE | ORAL | Status: DC
Start: 2020-02-14 — End: 2020-02-13

## 2020-02-13 MED ORDER — DEXTROSE 10 % IV SOLN
125.00 | INTRAVENOUS | Status: DC
Start: ? — End: 2020-02-13

## 2020-02-13 MED ORDER — HEPARIN SODIUM (PORCINE) 5000 UNIT/ML IJ SOLN
5000.00 | INTRAMUSCULAR | Status: DC
Start: 2020-02-13 — End: 2020-02-13

## 2020-02-13 NOTE — Telephone Encounter (Signed)
Patient was admitted at [facility]

## 2020-02-13 NOTE — Telephone Encounter (Signed)
Pt son would like to speak to someone about a order for the IVIG  Treatment that patient is suppose to have done. Patient is in the hospital and will not be discharged tomorrow so he will not be able to make that appt  Please call

## 2020-02-13 NOTE — Telephone Encounter (Signed)
Spoke with Dr. Lacinda Axon, he is going to talk to the attending Physician at [facility].

## 2020-02-14 ENCOUNTER — Ambulatory Visit (HOSPITAL_COMMUNITY): Admission: RE | Admit: 2020-02-14 | Payer: Medicare Other | Source: Ambulatory Visit

## 2020-02-14 ENCOUNTER — Telehealth: Payer: Self-pay

## 2020-02-14 ENCOUNTER — Other Ambulatory Visit: Payer: Self-pay

## 2020-02-14 DIAGNOSIS — H469 Unspecified optic neuritis: Secondary | ICD-10-CM | POA: Insufficient documentation

## 2020-02-14 DIAGNOSIS — G7249 Other inflammatory and immune myopathies, not elsewhere classified: Secondary | ICD-10-CM

## 2020-02-14 NOTE — Telephone Encounter (Signed)
[facility] called an dicharged patient, continue IVIG as directed by Dr.Patel. Ordered IVIG contacted [facility], they will contact patient.

## 2020-02-15 ENCOUNTER — Other Ambulatory Visit (HOSPITAL_COMMUNITY): Payer: Self-pay

## 2020-02-15 MED ORDER — ENOXAPARIN SODIUM 40 MG/0.4ML ~~LOC~~ SOLN
40.00 | SUBCUTANEOUS | Status: DC
Start: 2020-02-15 — End: 2020-02-15

## 2020-02-21 ENCOUNTER — Ambulatory Visit (HOSPITAL_COMMUNITY)
Admission: RE | Admit: 2020-02-21 | Discharge: 2020-02-21 | Disposition: A | Discharge status: Home / Self Care | Payer: Medicare Other | Source: Ambulatory Visit | Attending: Neurology | Admitting: Neurology

## 2020-02-21 ENCOUNTER — Encounter (HOSPITAL_COMMUNITY): Payer: Self-pay

## 2020-02-21 ENCOUNTER — Other Ambulatory Visit: Payer: Self-pay

## 2020-02-21 DIAGNOSIS — G7249 Other inflammatory and immune myopathies, not elsewhere classified: Secondary | ICD-10-CM | POA: Insufficient documentation

## 2020-02-21 MED ORDER — IMMUNE GLOBULIN HUMAN NICU IV SYRINGE 100 MG/ML: 1000.0000 mg/kg | INTRAMUSCULAR | Status: DC

## 2020-02-21 MED ORDER — ACETAMINOPHEN 325 MG PO TABS
325.0000 mg | ORAL_TABLET | ORAL | Status: DC
  Administered 2020-02-21: 325 mg via ORAL
  Filled 2020-02-21: qty 1

## 2020-02-21 MED ORDER — DIPHENHYDRAMINE HCL 25 MG PO CAPS
25.0000 mg | ORAL_CAPSULE | ORAL | Status: DC
  Administered 2020-02-21: 25 mg via ORAL
  Filled 2020-02-21: qty 1

## 2020-02-21 MED ORDER — IMMUNE GLOBULIN (HUMAN) 10 GM/100ML IV SOLN
1.0000 g/kg | INTRAVENOUS | Status: DC
  Administered 2020-02-21: 95 g via INTRAVENOUS
  Filled 2020-02-21: qty 50

## 2020-02-21 MED ORDER — DEXTROSE 5 % IV SOLN: INTRAVENOUS | Status: DC

## 2020-02-27 LAB — LIPID PANEL WITH LDL/HDL RATIO
Cholesterol, Total: 137 mg/dL (ref 100–199)
HDL: 47 mg/dL (ref >39)
LDL Chol Calc (NIH): 57 mg/dL (ref 0–99)
LDL/HDL Ratio: 1.2 ratio (ref 0.0–3.6)
Triglycerides: 204 mg/dL — ABNORMAL HIGH (ref 0–149)
VLDL Cholesterol Cal: 33 mg/dL (ref 5–40)

## 2020-02-27 LAB — LDL CHOLESTEROL, DIRECT: LDL Direct: 58 mg/dL (ref 0–99)

## 2020-03-06 ENCOUNTER — Ambulatory Visit (HOSPITAL_COMMUNITY): Payer: Medicare Other

## 2020-03-13 ENCOUNTER — Encounter (HOSPITAL_COMMUNITY): Payer: Medicare Other

## 2020-03-14 ENCOUNTER — Telehealth: Payer: Self-pay | Admitting: Neurology

## 2020-03-14 NOTE — Telephone Encounter (Signed)
Spoke with patients son, Dr. Thersa Salt, via phone.  We discussed the dilemma and the r/b/se of being on steroids vs those of tapering.  Ultimately, we decided on the slow taper.  Dr. Lacinda Axon would like [place] to f/u with him in the office later this month before Dr. Posey Pronto comes back to check on the patient.  Feels most comfortable with team here.

## 2020-03-17 ENCOUNTER — Other Ambulatory Visit: Payer: Self-pay

## 2020-03-17 ENCOUNTER — Other Ambulatory Visit: Payer: Self-pay | Admitting: Neurology

## 2020-03-17 DIAGNOSIS — G7249 Other inflammatory and immune myopathies, not elsewhere classified: Secondary | ICD-10-CM

## 2020-03-17 NOTE — Telephone Encounter (Signed)
Patient's wife called to check on the status of the lab orders. She scheduled an appointment for the patient on 03/27/20 at 1:30 for a work in slot with Dr. Carles Collet.  Please respond through [place]

## 2020-03-18 NOTE — Telephone Encounter (Signed)
Good morning! I thought Alyse Low was giving the approval when I looked at it, I guess. Will he need rescheduled?  Lavella Hammock

## 2020-03-20 ENCOUNTER — Other Ambulatory Visit: Payer: Self-pay

## 2020-03-20 ENCOUNTER — Telehealth: Payer: Self-pay | Admitting: Neurology

## 2020-03-20 ENCOUNTER — Ambulatory Visit (HOSPITAL_COMMUNITY)
Admission: RE | Admit: 2020-03-20 | Discharge: 2020-03-20 | Disposition: A | Discharge status: Home / Self Care | Payer: Medicare Other | Source: Ambulatory Visit | Attending: Neurology | Admitting: Neurology

## 2020-03-20 ENCOUNTER — Encounter (HOSPITAL_COMMUNITY): Payer: Self-pay

## 2020-03-20 DIAGNOSIS — G7249 Other inflammatory and immune myopathies, not elsewhere classified: Secondary | ICD-10-CM | POA: Diagnosis present

## 2020-03-20 MED ORDER — ACETAMINOPHEN 325 MG PO TABS
ORAL_TABLET | ORAL | Status: AC
  Administered 2020-03-20: 325 mg via ORAL
  Filled 2020-03-20: qty 1

## 2020-03-20 MED ORDER — DEXTROSE 5 % IV SOLN: INTRAVENOUS | Status: DC

## 2020-03-20 MED ORDER — DIPHENHYDRAMINE HCL 25 MG PO CAPS: 25.0000 mg | ORAL_CAPSULE | ORAL | Status: DC

## 2020-03-20 MED ORDER — ACETAMINOPHEN 325 MG PO TABS: 325.0000 mg | ORAL_TABLET | ORAL | Status: DC

## 2020-03-20 MED ORDER — IMMUNE GLOBULIN (HUMAN) 10 GM/100ML IV SOLN
1.0000 g/kg | INTRAVENOUS | Status: DC
  Administered 2020-03-20: 95 g via INTRAVENOUS
  Filled 2020-03-20: qty 50

## 2020-03-20 MED ORDER — DIPHENHYDRAMINE HCL 25 MG PO CAPS
ORAL_CAPSULE | ORAL | Status: AC
  Administered 2020-03-20: 25 mg via ORAL
  Filled 2020-03-20: qty 1

## 2020-03-20 NOTE — Telephone Encounter (Signed)
No answer at 1058am

## 2020-03-20 NOTE — Telephone Encounter (Signed)
I got a note from neuro immunologist at [facility] that she wanted to change the patient's IVIG dosing to "2 sessions every 3 weeks" from every 28 days.  I cannot tell if I actually ordered this, but do see that the patient is currently getting his IVIG therapy.  If we were the ones to order this, I am doubtful that [facility] changed the order.  However, Alyse Low, can you call the patient and/or son find out if the dosing frequency was changed.

## 2020-03-20 NOTE — Telephone Encounter (Signed)
Patient will discuss treatment with Dr. Carles Collet on next visit.

## 2020-03-20 NOTE — Progress Notes (Signed)
Spoke with patients wife last week via phone regarding the closure of Medical Day at [facility] after July 12,2021.  Explained that we would see patient through July 8 but his next appointment after that along with all future appointments will be at the [facility].  Gave patient an appointment print out along with the new information regarding the new location for his IVIG infusions after July 12.   Reminded patient his next appointment on July 8 is at [facility], but that will be the last appointment with [place].   Patient voiced understanding to all information.

## 2020-03-20 NOTE — Discharge Instructions (Signed)
[facility] will no longer be doing medical infusions after April 21, 2020.  Your future appointments after July 12 will all be at the new [facility].  Your last appointment at [facility] will be April 17, 2020 at 08:00.  We will provide you with your next appointment, which will be at [facility], when you come on July 8.   We just wanted to make you aware of this upcoming change.  When you do start going to [facility] you will enter from [facility] and proceed to the main entrance (also know as Entrance A or the [employer]) and proceed to admitting as you walk in.  They will then show you where to go from there.  Valet parking is also available.   We miss having you as a patient here at [facility] but we wish you well at the new location.

## 2020-03-21 ENCOUNTER — Other Ambulatory Visit: Payer: Self-pay | Admitting: Neurology

## 2020-03-25 LAB — CK ISO,SERUM,FRAC.ONLY

## 2020-03-25 LAB — COMPREHENSIVE METABOLIC PANEL
ALT: 66 IU/L — ABNORMAL HIGH (ref 0–44)
AST: 49 IU/L — ABNORMAL HIGH (ref 0–40)
Albumin/Globulin Ratio: 0.8 — ABNORMAL LOW (ref 1.2–2.2)
Albumin: 3.9 g/dL (ref 3.8–4.8)
Alkaline Phosphatase: 61 IU/L (ref 48–121)
BUN/Creatinine Ratio: 15 (ref 10–24)
BUN: 16 mg/dL (ref 8–27)
Bilirubin Total: 0.3 mg/dL (ref 0.0–1.2)
CO2: 22 mmol/L (ref 20–29)
Calcium: 9.7 mg/dL (ref 8.6–10.2)
Chloride: 101 mmol/L (ref 96–106)
Creatinine, Ser: 1.04 mg/dL (ref 0.76–1.27)
GFR calc Af Amer: 86 mL/min/{1.73_m2} (ref >59)
GFR calc non Af Amer: 74 mL/min/{1.73_m2} (ref >59)
Globulin, Total: 4.8 g/dL — ABNORMAL HIGH (ref 1.5–4.5)
Glucose: 167 mg/dL — ABNORMAL HIGH (ref 65–99)
Potassium: 4.1 mmol/L (ref 3.5–5.2)
Sodium: 137 mmol/L (ref 134–144)
Total Protein: 8.7 g/dL — ABNORMAL HIGH (ref 6.0–8.5)

## 2020-03-25 LAB — CBC WITH DIFFERENTIAL/PLATELET
Basophils Absolute: 0 10*3/uL (ref 0.0–0.2)
Basos: 1 %
EOS (ABSOLUTE): 0.1 10*3/uL (ref 0.0–0.4)
Eos: 1 %
Hematocrit: 41.3 % (ref 37.5–51.0)
Hemoglobin: 13.9 g/dL (ref 13.0–17.7)
Immature Grans (Abs): 0 10*3/uL (ref 0.0–0.1)
Immature Granulocytes: 0 %
Lymphocytes Absolute: 1.3 10*3/uL (ref 0.7–3.1)
Lymphs: 30 %
MCH: 31 pg (ref 26.6–33.0)
MCHC: 33.7 g/dL (ref 31.5–35.7)
MCV: 92 fL (ref 79–97)
Monocytes Absolute: 0.4 10*3/uL (ref 0.1–0.9)
Monocytes: 9 %
Neutrophils Absolute: 2.7 10*3/uL (ref 1.4–7.0)
Neutrophils: 59 %
Platelets: 212 10*3/uL (ref 150–450)
RBC: 4.48 x10E6/uL (ref 4.14–5.80)
RDW: 13.1 % (ref 11.6–15.4)
WBC: 4.5 10*3/uL (ref 3.4–10.8)

## 2020-03-25 LAB — CK REFLEX: Total CK: 534 U/L (ref 41–331)

## 2020-03-25 LAB — SPECIMEN STATUS REPORT

## 2020-03-27 ENCOUNTER — Ambulatory Visit: Payer: Medicare Other | Admitting: Neurology
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
## 2020-03-28 ENCOUNTER — Other Ambulatory Visit: Payer: Self-pay

## 2020-03-28 ENCOUNTER — Telehealth: Payer: Self-pay

## 2020-03-28 ENCOUNTER — Encounter: Payer: Self-pay | Admitting: Neurology

## 2020-03-28 ENCOUNTER — Ambulatory Visit (INDEPENDENT_AMBULATORY_CARE_PROVIDER_SITE_OTHER): Payer: Medicare Other | Admitting: Neurology

## 2020-03-28 ENCOUNTER — Other Ambulatory Visit (HOSPITAL_COMMUNITY): Payer: Self-pay

## 2020-03-28 VITALS — BP 138/67 | HR 72 | Ht 69.0 in | Wt 203.0 lb

## 2020-03-28 DIAGNOSIS — G7249 Other inflammatory and immune myopathies, not elsewhere classified: Secondary | ICD-10-CM

## 2020-03-28 DIAGNOSIS — R739 Hyperglycemia, unspecified: Secondary | ICD-10-CM | POA: Diagnosis not present

## 2020-03-28 DIAGNOSIS — H469 Unspecified optic neuritis: Secondary | ICD-10-CM

## 2020-03-28 NOTE — Telephone Encounter (Signed)
Infusions will now be at [facility] next infusion date 04/10/2020 at 8am , July 11/2019 at [place]. Then July 23 at [place], pt notified.

## 2020-03-28 NOTE — Patient Instructions (Signed)
1.  Continue prednisone taper as directed 2.  We will increase IVIG to two infusions every 3 weeks. 3.  Repeat CK, CBC with diff, CMP on/approximately 04/20/2020 4.  Monitor for worsening vision or increased weakness. 5.  Keep appointment with Dr. Posey Pronto on 8/6

## 2020-04-03 ENCOUNTER — Ambulatory Visit (HOSPITAL_COMMUNITY): Payer: Medicare Other

## 2020-04-10 ENCOUNTER — Telehealth: Payer: Self-pay | Admitting: Neurology

## 2020-04-10 ENCOUNTER — Other Ambulatory Visit: Payer: Self-pay

## 2020-04-10 ENCOUNTER — Encounter (HOSPITAL_COMMUNITY): Payer: Medicare Other

## 2020-04-10 ENCOUNTER — Encounter (HOSPITAL_COMMUNITY)
Admission: RE | Admit: 2020-04-10 | Discharge: 2020-04-10 | Disposition: A | Discharge status: Home / Self Care | Payer: Medicare Other | Source: Ambulatory Visit | Attending: Neurology | Admitting: Neurology

## 2020-04-10 DIAGNOSIS — G7249 Other inflammatory and immune myopathies, not elsewhere classified: Secondary | ICD-10-CM | POA: Insufficient documentation

## 2020-04-10 DIAGNOSIS — D8989 Other specified disorders involving the immune mechanism, not elsewhere classified: Secondary | ICD-10-CM | POA: Insufficient documentation

## 2020-04-10 MED ORDER — IMMUNE GLOBULIN (HUMAN) 10 GM/100ML IV SOLN
1.0000 g/kg | INTRAVENOUS | Status: DC
  Administered 2020-04-10: 90 g via INTRAVENOUS
  Filled 2020-04-10: qty 800

## 2020-04-10 NOTE — Telephone Encounter (Signed)
Patient's wife called to request more orders for the patient's IVIG treatment.

## 2020-04-11 ENCOUNTER — Other Ambulatory Visit: Payer: Self-pay

## 2020-04-11 ENCOUNTER — Encounter (HOSPITAL_COMMUNITY)
Admission: RE | Admit: 2020-04-11 | Discharge: 2020-04-11 | Disposition: A | Discharge status: Home / Self Care | Payer: Medicare Other | Source: Ambulatory Visit | Attending: Neurology | Admitting: Neurology

## 2020-04-11 DIAGNOSIS — G7249 Other inflammatory and immune myopathies, not elsewhere classified: Secondary | ICD-10-CM | POA: Diagnosis not present

## 2020-04-11 MED ORDER — IMMUNE GLOBULIN (HUMAN) 10 GM/100ML IV SOLN
1.0000 g/kg | INTRAVENOUS | Status: DC
  Administered 2020-04-11: 90 g via INTRAVENOUS
  Filled 2020-04-11: qty 800

## 2020-04-12 ENCOUNTER — Other Ambulatory Visit: Payer: Self-pay | Admitting: Cardiology

## 2020-04-12 DIAGNOSIS — I2510 Atherosclerotic heart disease of native coronary artery without angina pectoris: Secondary | ICD-10-CM

## 2020-04-17 ENCOUNTER — Encounter (HOSPITAL_COMMUNITY): Payer: Medicare Other

## 2020-04-17 NOTE — Telephone Encounter (Signed)
Has this been completed?  Sending to clinical staff for review: Okay to sign/close encounter or is further follow up needed? 

## 2020-04-18 ENCOUNTER — Other Ambulatory Visit: Payer: Self-pay | Admitting: Family Medicine

## 2020-04-19 LAB — COMPREHENSIVE METABOLIC PANEL
ALT: 72 IU/L — ABNORMAL HIGH (ref 0–44)
AST: 53 IU/L — ABNORMAL HIGH (ref 0–40)
Albumin/Globulin Ratio: 0.9 — ABNORMAL LOW (ref 1.2–2.2)
Albumin: 4.1 g/dL (ref 3.8–4.8)
Alkaline Phosphatase: 66 IU/L (ref 48–121)
BUN/Creatinine Ratio: 15 (ref 10–24)
BUN: 14 mg/dL (ref 8–27)
Bilirubin Total: 0.4 mg/dL (ref 0.0–1.2)
CO2: 27 mmol/L (ref 20–29)
Calcium: 10 mg/dL (ref 8.6–10.2)
Chloride: 98 mmol/L (ref 96–106)
Creatinine, Ser: 0.96 mg/dL (ref 0.76–1.27)
GFR calc Af Amer: 94 mL/min/{1.73_m2} (ref >59)
GFR calc non Af Amer: 81 mL/min/{1.73_m2} (ref >59)
Globulin, Total: 4.6 g/dL — ABNORMAL HIGH (ref 1.5–4.5)
Glucose: 112 mg/dL — ABNORMAL HIGH (ref 65–99)
Potassium: 4.5 mmol/L (ref 3.5–5.2)
Sodium: 136 mmol/L (ref 134–144)
Total Protein: 8.7 g/dL — ABNORMAL HIGH (ref 6.0–8.5)

## 2020-04-19 LAB — CBC
Hematocrit: 40.6 % (ref 37.5–51.0)
Hemoglobin: 13.8 g/dL (ref 13.0–17.7)
MCH: 31.1 pg (ref 26.6–33.0)
MCHC: 34 g/dL (ref 31.5–35.7)
MCV: 91 fL (ref 79–97)
Platelets: 209 10*3/uL (ref 150–450)
RBC: 4.44 x10E6/uL (ref 4.14–5.80)
RDW: 13.3 % (ref 11.6–15.4)
WBC: 4.7 10*3/uL (ref 3.4–10.8)

## 2020-04-19 LAB — LIPID PANEL W/O CHOL/HDL RATIO
Cholesterol, Total: 117 mg/dL (ref 100–199)
HDL: 40 mg/dL (ref >39)
LDL Chol Calc (NIH): 51 mg/dL (ref 0–99)
Triglycerides: 155 mg/dL — ABNORMAL HIGH (ref 0–149)
VLDL Cholesterol Cal: 26 mg/dL (ref 5–40)

## 2020-04-19 LAB — HGB A1C W/O EAG: Hgb A1c MFr Bld: 7.4 % — ABNORMAL HIGH (ref 4.8–5.6)

## 2020-04-19 LAB — CK: Total CK: 361 U/L — ABNORMAL HIGH (ref 41–331)

## 2020-04-22 ENCOUNTER — Encounter: Payer: Self-pay | Admitting: Family Medicine

## 2020-04-25 ENCOUNTER — Ambulatory Visit: Payer: Medicare Other | Admitting: Family Medicine

## 2020-05-01 ENCOUNTER — Ambulatory Visit (HOSPITAL_COMMUNITY): Payer: Medicare Other

## 2020-05-01 ENCOUNTER — Other Ambulatory Visit: Payer: Self-pay

## 2020-05-01 ENCOUNTER — Encounter (HOSPITAL_COMMUNITY)
Admission: RE | Admit: 2020-05-01 | Discharge: 2020-05-01 | Disposition: A | Discharge status: Home / Self Care | Payer: Medicare Other | Source: Ambulatory Visit | Attending: Neurology | Admitting: Neurology

## 2020-05-01 ENCOUNTER — Telehealth: Payer: Self-pay | Admitting: Neurology

## 2020-05-01 DIAGNOSIS — G7249 Other inflammatory and immune myopathies, not elsewhere classified: Secondary | ICD-10-CM | POA: Diagnosis not present

## 2020-05-01 MED ORDER — IMMUNE GLOBULIN (HUMAN) 10 GM/100ML IV SOLN
1.0000 g/kg | Freq: Once | INTRAVENOUS | Status: AC
  Administered 2020-05-01: 90 g via INTRAVENOUS
  Filled 2020-05-01: qty 800

## 2020-05-01 NOTE — Telephone Encounter (Signed)
Returned call to [place] at [facility] who was verifying IVIG schedule, confirmed that pt to receive every 21 days x 2 doses 24 hrs apart.

## 2020-05-01 NOTE — Telephone Encounter (Signed)
Shelton Silvas from [place] called in wanting to double check on some orders. She needs to know how the patient's IV IG should be done.

## 2020-05-02 ENCOUNTER — Other Ambulatory Visit: Payer: Self-pay

## 2020-05-02 ENCOUNTER — Encounter (HOSPITAL_COMMUNITY)
Admission: RE | Admit: 2020-05-02 | Discharge: 2020-05-02 | Disposition: A | Discharge status: Home / Self Care | Payer: Medicare Other | Source: Ambulatory Visit | Attending: Neurology | Admitting: Neurology

## 2020-05-02 DIAGNOSIS — G7249 Other inflammatory and immune myopathies, not elsewhere classified: Secondary | ICD-10-CM | POA: Diagnosis not present

## 2020-05-02 MED ORDER — IMMUNE GLOBULIN (HUMAN) 10 GM/100ML IV SOLN
1.0000 g/kg | INTRAVENOUS | Status: DC
  Administered 2020-05-02: 90 g via INTRAVENOUS
  Filled 2020-05-02: qty 800

## 2020-05-05 ENCOUNTER — Other Ambulatory Visit: Payer: Self-pay

## 2020-05-05 DIAGNOSIS — H469 Unspecified optic neuritis: Secondary | ICD-10-CM

## 2020-05-05 DIAGNOSIS — Z5181 Encounter for therapeutic drug level monitoring: Secondary | ICD-10-CM

## 2020-05-05 NOTE — Progress Notes (Signed)
Lab orders entered and faxed to [facility] [phone] Pt notified.

## 2020-05-06 ENCOUNTER — Ambulatory Visit: Payer: Medicare Other | Admitting: Cardiology

## 2020-05-06 ENCOUNTER — Other Ambulatory Visit: Payer: Self-pay | Admitting: Neurology

## 2020-05-07 LAB — COMPREHENSIVE METABOLIC PANEL
ALT: 59 IU/L — ABNORMAL HIGH (ref 0–44)
AST: 46 IU/L — ABNORMAL HIGH (ref 0–40)
Albumin/Globulin Ratio: 0.7 — ABNORMAL LOW (ref 1.2–2.2)
Albumin: 3.9 g/dL (ref 3.8–4.8)
Alkaline Phosphatase: 59 IU/L (ref 48–121)
BUN/Creatinine Ratio: 18 (ref 10–24)
BUN: 18 mg/dL (ref 8–27)
Bilirubin Total: 0.4 mg/dL (ref 0.0–1.2)
CO2: 24 mmol/L (ref 20–29)
Calcium: 9.7 mg/dL (ref 8.6–10.2)
Chloride: 99 mmol/L (ref 96–106)
Creatinine, Ser: 1 mg/dL (ref 0.76–1.27)
GFR calc Af Amer: 90 mL/min/{1.73_m2} (ref >59)
GFR calc non Af Amer: 78 mL/min/{1.73_m2} (ref >59)
Globulin, Total: 5.5 g/dL — ABNORMAL HIGH (ref 1.5–4.5)
Glucose: 96 mg/dL (ref 65–99)
Potassium: 4.4 mmol/L (ref 3.5–5.2)
Sodium: 135 mmol/L (ref 134–144)
Total Protein: 9.4 g/dL — ABNORMAL HIGH (ref 6.0–8.5)

## 2020-05-07 LAB — CBC/DIFF AMBIGUOUS DEFAULT
Basophils Absolute: 0 10*3/uL (ref 0.0–0.2)
Basos: 1 %
EOS (ABSOLUTE): 0 10*3/uL (ref 0.0–0.4)
Eos: 0 %
Hematocrit: 40.7 % (ref 37.5–51.0)
Hemoglobin: 13.4 g/dL (ref 13.0–17.7)
Immature Grans (Abs): 0 10*3/uL (ref 0.0–0.1)
Immature Granulocytes: 1 %
Lymphocytes Absolute: 2.3 10*3/uL (ref 0.7–3.1)
Lymphs: 58 %
MCH: 29.6 pg (ref 26.6–33.0)
MCHC: 32.9 g/dL (ref 31.5–35.7)
MCV: 90 fL (ref 79–97)
Monocytes Absolute: 0.5 10*3/uL (ref 0.1–0.9)
Monocytes: 12 %
Neutrophils Absolute: 1.1 10*3/uL — ABNORMAL LOW (ref 1.4–7.0)
Neutrophils: 28 %
Platelets: 205 10*3/uL (ref 150–450)
RBC: 4.53 x10E6/uL (ref 4.14–5.80)
RDW: 13.5 % (ref 11.6–15.4)
WBC: 4 10*3/uL (ref 3.4–10.8)

## 2020-05-07 LAB — CK: Total CK: 331 U/L (ref 41–331)

## 2020-05-07 LAB — SPECIMEN STATUS REPORT

## 2020-05-09 ENCOUNTER — Encounter: Payer: Self-pay | Admitting: Cardiology

## 2020-05-09 ENCOUNTER — Other Ambulatory Visit: Payer: Self-pay

## 2020-05-09 ENCOUNTER — Ambulatory Visit: Payer: Medicare Other | Admitting: Cardiology

## 2020-05-09 VITALS — BP 120/74 | HR 61 | Ht 69.0 in | Wt 204.6 lb

## 2020-05-09 DIAGNOSIS — R945 Abnormal results of liver function studies: Secondary | ICD-10-CM

## 2020-05-09 DIAGNOSIS — E119 Type 2 diabetes mellitus without complications: Secondary | ICD-10-CM

## 2020-05-09 DIAGNOSIS — E7800 Pure hypercholesterolemia, unspecified: Secondary | ICD-10-CM

## 2020-05-09 DIAGNOSIS — Z789 Other specified health status: Secondary | ICD-10-CM

## 2020-05-09 DIAGNOSIS — G7249 Other inflammatory and immune myopathies, not elsewhere classified: Secondary | ICD-10-CM

## 2020-05-09 DIAGNOSIS — I2510 Atherosclerotic heart disease of native coronary artery without angina pectoris: Secondary | ICD-10-CM

## 2020-05-09 DIAGNOSIS — I208 Other forms of angina pectoris: Secondary | ICD-10-CM

## 2020-05-09 DIAGNOSIS — Z955 Presence of coronary angioplasty implant and graft: Secondary | ICD-10-CM

## 2020-05-09 DIAGNOSIS — R7989 Other specified abnormal findings of blood chemistry: Secondary | ICD-10-CM
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
## 2020-05-09 NOTE — Progress Notes (Signed)
Primary Physician/Referring:  Marin Olp, MD  Patient ID: Stanley Boyd, male    DOB: 1951/07/09, 69 y.o.   MRN: [id number]  Chief Complaint  Patient presents with  . Coronary Artery Disease  . Follow-up    C/O fatigue   HPI:    Stanley Boyd  is a 69 y.o. Caucasian male with CAD,staged PCI, first on 07/08/2015 with 3.0x38 & 3.0x74mm Resolute DES to prox and mid LAD, then on 07/17/2015 with stenting of the bifurcating large OM1 and proximal circumflex with implantation of 3.0 x 28 mm in the circumflex and 3.0 x 24 mm Synergy DES with [employer]. 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  Patient now presents to the office with a chief complaint of "fatigue and diaphoresis with activity."  Patient is accompanied by his son who is a family physician and provides majority of history of present illness.  Patient has known history of extensive coronary artery disease as described above.  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  Patient has also been restarted on Praluent since July 06, 2019.  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  The patient now presented to the office with symptoms of generalized fatigue and exertional diaphoresis.  Patient does not have any chest pain or shortness of breath at rest or with effort related activities.  The patient's son his anginal equivalent has always been exertional diaphoresis.  Patient states that the last episode was approximately 2 days ago he was working out in the yard.  He was mowing around the cedar trees at which time he started having exertional diaphoresis.  Since then he has been feeling significant tired and fatigue is  similar to what he says felt in the past prior to his coronary interventions.  They asked for an urgent appointment given his underlying symptoms.  Past Medical History:  Diagnosis Date  . BPH (benign prostatic hyperplasia)   . Bradycardia    40s to low 50s  . Chronic back pain    multiple surgeries following fall from a deer stand  . Coronary artery disease    5 stents  . Headache    "probably monthly" (07/08/2015)  . Hyperlipidemia    on injectable medication  . Kidney stones    "years ago; never had OR/scopes"  . 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 Neuromuscular disorder ([place])   . Pneumonia 08/2014   Past Surgical History:  Procedure Laterality Date  . ANTERIOR CERVICAL DECOMP/DISCECTOMY FUSION  78; 29; [place]    . BIOPSY PROSTATE  2017  . CARDIAC CATHETERIZATION N/A 07/08/2015   Procedure: Left Heart Cath and Coronary Angiography;  Surgeon: Adrian Prows, MD;  Location: [facility];  Service: Cardiovascular;  Laterality: N/A;  . CARDIAC CATHETERIZATION N/A 07/17/2015   Procedure: Coronary Stent Intervention;  Surgeon: Adrian Prows, MD;  Location: [facility];  Service: Cardiovascular;  Laterality: N/A;  . CARDIAC CATHETERIZATION N/A 04/16/2016   Procedure: Left Heart Cath and Coronary Angiography;  Surgeon: Adrian Prows, MD;  Location: [facility];  Service: Cardiovascular;  Laterality: N/A;  . CARDIAC CATHETERIZATION N/A 04/16/2016   Procedure: Coronary Stent Intervention;  Surgeon: Adrian Prows,  MD;  Location: [facility];  Service: Cardiovascular;  Laterality: N/A;  . COLONOSCOPY N/A 04/05/2014   Procedure: COLONOSCOPY;  Surgeon: Danie Binder, MD;  [facility];  Service: Endoscopy;  Laterality: N/A;  10:30 AM  . COLONOSCOPY N/A 01/25/2020   Procedure: COLONOSCOPY;  Surgeon: Danie Binder, MD;  [facility];  Service: Endoscopy;  Laterality: N/A;  9:30  . CORONARY ANGIOPLASTY WITH STENT PLACEMENT   07/08/2015   "2 stents"  . CORONARY STENT PLACEMENT  04/16/2016   PTCA and stenting of the ostial LAD with implantation of a 3.0 x 12 mm resolute integrity DES, stenosis reduced from 80% to 0% with maintenance of TIMI-3 flow. Ostial OM1 in-stent restenosis reduced to less than 20% with a 2.0 x 12 mm emerge balloon at 14 atmospheric pressure  . LIVER BIOPSY  03/2019  . [place] SURGERY  1984  . MUSCLE BIOPSY N/A 02/20/2019   Procedure: LEFT QUADRICEP MUSCLE  BIOPSY AND PUNCH BIOPSY OF RIGHT CHEST;  Surgeon: Ileana Roup, MD;  Location: [facility];  Service: General;  Laterality: N/A;  . POLYPECTOMY  01/25/2020   Procedure: POLYPECTOMY;  Surgeon: Danie Binder, MD;  [facility];  Service: Endoscopy;;  sigmoid  . PTCA  07/17/2015   OMI    DES   Social History   Tobacco Use  . Smoking status: Never Smoker  . Smokeless tobacco: Never Used  Substance Use Topics  . Alcohol use: No    Alcohol/week: 0.0 standard drinks   Marital Status: Married   ROS  Review of Systems  Constitutional: Positive for malaise/fatigue.  Cardiovascular: Negative for chest pain, dyspnea on exertion, leg swelling, palpitations and syncope.       Exertional diaphoresis.   Gastrointestinal: Negative for melena.    Objective   Vitals with BMI 05/09/2020 05/02/2020 05/02/2020  Height 5\' 9"  - -  Weight 204 lbs 10 oz - -  BMI 18.8 - -  Systolic 416 606 301  Diastolic 74 77 73  Pulse 61 58 54    Physical Exam Cardiovascular:     Rate and Rhythm: Normal rate and regular rhythm.     Pulses: Intact distal pulses.     Heart sounds: Normal heart sounds. No murmur heard.  No gallop.      Comments: No leg edema, no JVD. Pulmonary:     Effort: Pulmonary effort is normal.     Breath sounds: Normal breath sounds.  Abdominal:     General: Bowel sounds are normal.     Palpations: Abdomen is soft.     Radiology: No results found.  Laboratory examination:   Recent Labs    03/21/20 1123  04/18/20 1017 05/06/20 1355  NA 137 136 135  K 4.1 4.5 4.4  CL 101 98 99  CO2 22 27 24   GLUCOSE 167* 112* 96  BUN 16 14 18   CREATININE 1.04 0.96 1.00  CALCIUM 9.7 10.0 9.7  GFRNONAA 74 81 78  GFRAA 86 94 90   CMP Latest Ref Rng & Units 05/06/2020 04/18/2020 03/21/2020  Glucose 65 - 99 mg/dL 96 112(H) 167(H)  BUN 8 - 27 mg/dL 18 14 16   Creatinine 0.76 - 1.27 mg/dL 1.00 0.96 1.04  Sodium 134 - 144 mmol/L 135 136 137  Potassium 3.5 - 5.2 mmol/L 4.4 4.5 4.1  Chloride 96 - 106 mmol/L 99 98 101  CO2 20 - 29 mmol/L 24 27 22   Calcium 8.6 - 10.2  mg/dL 9.7 10.0 9.7  Total Protein 6.0 - 8.5 g/dL 9.4(H) 8.7(H) 8.7(H)  Total Bilirubin 0.0 - 1.2 mg/dL 0.4 0.4 0.3  Alkaline Phos 48 - 121 IU/L 59 66 61  AST 0 - 40 IU/L 46(H) 53(H) 49(H)  ALT 0 - 44 IU/L 59(H) 72(H) 66(H)   CBC Latest Ref Rng & Units 05/06/2020 04/18/2020 03/21/2020  WBC 3.4 - 10.8 x10E3/uL 4.0 4.7 4.5  Hemoglobin 13.0 - 17.7 g/dL 13.4 13.8 13.9  Hematocrit 37.5 - 51.0 % 40.7 40.6 41.3  Platelets 150 - 450 x10E3/uL 205 209 212   Lipid Panel     Component Value Date/Time   CHOL 117 04/18/2020 1017   TRIG 155 (H) 04/18/2020 1017   HDL 40 04/18/2020 1017   CHOLHDL 6 06/28/2019 0933   VLDL 46.2 (H) 06/28/2019 0933   LDLCALC 51 04/18/2020 1017   LDLDIRECT 58 02/26/2020 1026   LDLDIRECT 216.0 06/28/2019 0933   HEMOGLOBIN A1C Lab Results  Component Value Date   HGBA1C 7.4 (H) 04/18/2020   MPG 154 03/20/2019   TSH Recent Labs    06/28/19 0933 01/09/20 0000 01/09/20 1131  TSH 4.52* 4.26 4.260   Medications and allergies   Allergies  Allergen Reactions  . Other     Other reaction(s): Other (See Comments) Myositis Myositis Myositis  . Statins Other (See Comments)    Myositis Myositis     Current Outpatient Medications  Medication Instructions  . aspirin 81 mg, Oral, Daily at bedtime  . Carboxymethylcellulose Sodium (REFRESH TEARS OP) 1 drop, Ophthalmic  . cyanocobalamin (,VITAMIN B-12,) 1000 MCG/ML  injection Inject IM daily for 7 days, then weekly for 4 weeks, then monthly  . ezetimibe (ZETIA) 10 MG tablet TAKE 1 TABLET BY MOUTH ONCE DAILY AFTER SUPPER.  . finasteride (PROSCAR) 5 mg, Oral, Daily at bedtime  . fluticasone (FLONASE) 50 MCG/ACT nasal spray 2 sprays, Each Nare, Daily PRN  . icosapent Ethyl (VASCEPA) 2 g, Oral, 2 times daily  . Immune Globulin 10% (IMMUNE GLOBULIN 10%) 10G/165mL (10,000mg /126mL) SOLN 1 g/kg, Intravenous,  Once, 75 g every 28 days for 1 consecutive days  . metFORMIN (GLUCOPHAGE) 500-1,000 mg, Oral, 2 times daily with meals, Take 1000mg  in morning with breakfast and 500mg  with dinner  . mycophenolate (CELLCEPT) 750 mg, Oral, 2 times daily  . nitroGLYCERIN (NITROSTAT) 0.4 mg, Sublingual, Every 5 min PRN  . PRALUENT 150 MG/ML SOAJ INJECT CONTENTS OF 1 PEN SUBCUTANEOUSLY EVERY TWO WEEKS  . Syringe/Needle, Disp, (SYRINGE 3CC/22GX1") 22G X 1" 3 ML MISC Use with B12 injections  . tamsulosin (FLOMAX) 0.4 mg, Oral, Daily at bedtime   Cardiac Studies:   Coronary angiogram 04/16/16: Proximal Cx/OM1 bifurcating  (3.0 x 28 mm in the circumflex and 3.0 x 24 mm  Synergy DES). 07/08/2015: PCI to proximal and mid LAD with 3.0x38 and 3.5x15 mm Resolute DES (07/08/15).  Presented with near syncope Focal OM restenosis S/P Balloon PTCA  Echocardiogram 04/092020 :   1. The left ventricle has normal systolic function, with an ejection fraction of 60-65%. The cavity size was normal. Left ventricular diastolic Doppler parameters are consistent with impaired relaxation.  2. The right ventricle has normal systolc function. The cavity was normal. There is no increase in right ventricular wall thickness.  EKG: 05/09/2020: Sinus bradycardia, 50 bpm, normal axis, nonspecific T wave abnormality, without underlying injury pattern.  No significant change compared to prior ECG.   Assessment     ICD-10-CM   1. 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 Coronary artery disease involving  native coronary artery of native heart without angina pectoris  I25.10 EKG 12-Lead  3. History of coronary angioplasty with insertion of stent  Z95.5   4. Hypercholesteremia  E78.00   5. 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 Controlled type 2 diabetes mellitus without complication, without long-term current use of insulin (HCC)  E11.9   7. Abnormal LFTs  R94.5   8. Statin intolerance  Z78.9     Recommendations:   Yale Golla  is a 69 y.o. Caucasian male with CAD,staged PCI, first on 07/08/2015 with 3.0x38 & 3.0x8mm Resolute DES to prox and mid LAD, then on 07/17/2015 with stenting of the bifurcating large OM1 and proximal circumflex with implantation of 3.0 x 28 mm in the circumflex and 3.0 x 24 mm Synergy DES with [employer]. 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  Patient states that when he was diagnosed with coronary artery disease he had exertional diaphoresis which is exactly what he has been experiencing for the last 2 days.  He does not have any active chest pain or shortness of breath at rest or with effort related activities.  He has been compliant with his medical therapy.  EKG shows sinus bradycardia without underlying ischemia or injury pattern.  Had a detailed discussion with the son who is a family physician in regards to undergoing an ischemic evaluation.  We discussed considering nuclear stress test to evaluate for reversible ischemia versus invasive angiography.  This shared decision between the patient and his son was to proceed with left heart catheterization with possible intervention as his symptoms are exactly what they have have been in the past when he is needed coronary intervention.  They deny any active discomfort.   Plan of care was also discussed  with his primary cardiologist Dr. Einar Gip at today's office visit.   Patient has had recent blood work done as of May 06, 2020.  He will be scheduled for left heart catheterization with possible intervention. The left heart catheterization procedure was explained to the patient and his son Dr. Lacinda Axon in detail. The indication, alternatives, risks and benefits were reviewed. Complications including but not limited to bleeding, infection, acute kidney injury, blood transfusion, heart rhythm disturbances, contrast (dye) reaction, damage to the arteries or nerves in the legs or hands, cerebrovascular accident, myocardial infarction, need for emergent bypass surgery, blood clots in the legs, possible need for emergent blood transfusion, and rarely death were reviewed and discussed with the patient and his son. The patient and his son voices understanding and wishes to proceed.   Total time spent: 48 minutes as I was seeing the patient for the first time.  Reviewed prior records, explanation of diagnoses, formulating plan of care, and coordination of care.    Rex Kras, [place], [facility]  Pager: [phone] Office: [phone]

## 2020-05-13 ENCOUNTER — Other Ambulatory Visit: Payer: Self-pay

## 2020-05-13 ENCOUNTER — Ambulatory Visit (HOSPITAL_COMMUNITY)
Admission: RE | Admit: 2020-05-13 | Discharge: 2020-05-13 | Disposition: A | Discharge status: Home / Self Care | Payer: Medicare Other | Attending: Cardiology | Admitting: Cardiology

## 2020-05-13 ENCOUNTER — Encounter (HOSPITAL_COMMUNITY)
Admission: RE | Disposition: A | Discharge status: Home / Self Care | Payer: Self-pay | Source: Home / Self Care | Attending: Cardiology

## 2020-05-13 DIAGNOSIS — Z79899 Other long term (current) drug therapy: Secondary | ICD-10-CM | POA: Insufficient documentation

## 2020-05-13 DIAGNOSIS — G8929 Other chronic pain: Secondary | ICD-10-CM | POA: Insufficient documentation

## 2020-05-13 DIAGNOSIS — M549 Dorsalgia, unspecified: Secondary | ICD-10-CM | POA: Insufficient documentation

## 2020-05-13 DIAGNOSIS — E785 Hyperlipidemia, unspecified: Secondary | ICD-10-CM | POA: Diagnosis not present

## 2020-05-13 DIAGNOSIS — N400 Enlarged prostate without lower urinary tract symptoms: Secondary | ICD-10-CM | POA: Insufficient documentation

## 2020-05-13 DIAGNOSIS — E119 Type 2 diabetes mellitus without complications: Secondary | ICD-10-CM | POA: Insufficient documentation

## 2020-05-13 DIAGNOSIS — E7800 Pure hypercholesterolemia, unspecified: Secondary | ICD-10-CM | POA: Diagnosis not present

## 2020-05-13 DIAGNOSIS — Z955 Presence of coronary angioplasty implant and graft: Secondary | ICD-10-CM | POA: Insufficient documentation

## 2020-05-13 DIAGNOSIS — Z7982 Long term (current) use of aspirin: Secondary | ICD-10-CM | POA: Insufficient documentation

## 2020-05-13 DIAGNOSIS — I25119 Atherosclerotic heart disease of native coronary artery with unspecified angina pectoris: Secondary | ICD-10-CM | POA: Diagnosis not present

## 2020-05-13 DIAGNOSIS — I2510 Atherosclerotic heart disease of native coronary artery without angina pectoris: Secondary | ICD-10-CM | POA: Diagnosis present

## 2020-05-13 DIAGNOSIS — G7249 Other inflammatory and immune myopathies, not elsewhere classified: Secondary | ICD-10-CM | POA: Insufficient documentation

## 2020-05-13 HISTORY — PX: LEFT HEART CATH AND CORONARY ANGIOGRAPHY: CATH118249

## 2020-05-13 LAB — GLUCOSE, CAPILLARY
Glucose-Capillary: 107 mg/dL — ABNORMAL HIGH (ref 70–99)
Glucose-Capillary: 82 mg/dL (ref 70–99)

## 2020-05-13 SURGERY — LEFT HEART CATH AND CORONARY ANGIOGRAPHY
Anesthesia: LOCAL

## 2020-05-13 MED ORDER — IOHEXOL 350 MG/ML SOLN
INTRAVENOUS | Status: DC | PRN
  Administered 2020-05-13: 45 mL

## 2020-05-13 MED ORDER — ONDANSETRON HCL 4 MG/2ML IJ SOLN: 4.0000 mg | Freq: Four times a day (QID) | INTRAMUSCULAR | Status: DC | PRN

## 2020-05-13 MED ORDER — SODIUM CHLORIDE 0.9% FLUSH: 3.0000 mL | INTRAVENOUS | Status: DC | PRN

## 2020-05-13 MED ORDER — MIDAZOLAM HCL 2 MG/2ML IJ SOLN
INTRAMUSCULAR | Status: AC
  Filled 2020-05-13: qty 2

## 2020-05-13 MED ORDER — SODIUM CHLORIDE 0.9 % WEIGHT BASED INFUSION: 1.0000 mL/kg/h | INTRAVENOUS | Status: DC

## 2020-05-13 MED ORDER — ACETAMINOPHEN 325 MG PO TABS: 650.0000 mg | ORAL_TABLET | ORAL | Status: DC | PRN

## 2020-05-13 MED ORDER — SODIUM CHLORIDE 0.9 % WEIGHT BASED INFUSION
3.0000 mL/kg/h | INTRAVENOUS | Status: DC
  Administered 2020-05-13: 3 mL/kg/h via INTRAVENOUS

## 2020-05-13 MED ORDER — LIDOCAINE HCL (PF) 1 % IJ SOLN
INTRAMUSCULAR | Status: DC | PRN
  Administered 2020-05-13: 2 mL

## 2020-05-13 MED ORDER — SODIUM CHLORIDE 0.9 % IV SOLN: 250.0000 mL | INTRAVENOUS | Status: DC | PRN

## 2020-05-13 MED ORDER — HYDRALAZINE HCL 20 MG/ML IJ SOLN: 10.0000 mg | INTRAMUSCULAR | Status: DC | PRN

## 2020-05-13 MED ORDER — LIDOCAINE HCL (PF) 1 % IJ SOLN
INTRAMUSCULAR | Status: AC
  Filled 2020-05-13: qty 30

## 2020-05-13 MED ORDER — HEPARIN SODIUM (PORCINE) 1000 UNIT/ML IJ SOLN
INTRAMUSCULAR | Status: AC
  Filled 2020-05-13: qty 1

## 2020-05-13 MED ORDER — SODIUM CHLORIDE 0.9% FLUSH: 3.0000 mL | Freq: Two times a day (BID) | INTRAVENOUS | Status: DC

## 2020-05-13 MED ORDER — HEPARIN (PORCINE) IN NACL 1000-0.9 UT/500ML-% IV SOLN
INTRAVENOUS | Status: DC | PRN
  Administered 2020-05-13 (×2): 500 mL

## 2020-05-13 MED ORDER — MIDAZOLAM HCL 2 MG/2ML IJ SOLN
INTRAMUSCULAR | Status: DC | PRN
  Administered 2020-05-13: 2 mg via INTRAVENOUS

## 2020-05-13 MED ORDER — VERAPAMIL HCL 2.5 MG/ML IV SOLN
INTRAVENOUS | Status: AC
  Filled 2020-05-13: qty 2

## 2020-05-13 MED ORDER — VERAPAMIL HCL 2.5 MG/ML IV SOLN
INTRAVENOUS | Status: DC | PRN
  Administered 2020-05-13: 5 mL via INTRA_ARTERIAL

## 2020-05-13 MED ORDER — ASPIRIN 81 MG PO CHEW: 81.0000 mg | CHEWABLE_TABLET | ORAL | Status: DC

## 2020-05-13 MED ORDER — FENTANYL CITRATE (PF) 100 MCG/2ML IJ SOLN
INTRAMUSCULAR | Status: AC
  Filled 2020-05-13: qty 2

## 2020-05-13 MED ORDER — HEPARIN SODIUM (PORCINE) 1000 UNIT/ML IJ SOLN
INTRAMUSCULAR | Status: DC | PRN
  Administered 2020-05-13: 4500 [IU] via INTRAVENOUS

## 2020-05-13 MED ORDER — FENTANYL CITRATE (PF) 100 MCG/2ML IJ SOLN
INTRAMUSCULAR | Status: DC | PRN
  Administered 2020-05-13: 25 ug via INTRAVENOUS

## 2020-05-13 SURGICAL SUPPLY — 9 items
CATH OPTITORQUE TIG 4.0 5F (CATHETERS) ×2 IMPLANT
DEVICE RAD COMP TR BAND LRG (VASCULAR PRODUCTS) ×2 IMPLANT
GLIDESHEATH SLEND SS 6F .021 (SHEATH) ×2 IMPLANT
GUIDEWIRE INQWIRE 1.5J.035X260 (WIRE) ×1 IMPLANT
INQWIRE 1.5J .035X260CM (WIRE) ×2
KIT HEART LEFT (KITS) ×2 IMPLANT
PACK CARDIAC CATHETERIZATION (CUSTOM PROCEDURE TRAY) ×2 IMPLANT
TRANSDUCER W/STOPCOCK (MISCELLANEOUS) ×2 IMPLANT
TUBING CIL FLEX 10 FLL-RA (TUBING) ×2 IMPLANT

## 2020-05-13 NOTE — Discharge Instructions (Signed)
Radial Site Care  This sheet gives you information about how to care for yourself after your procedure. Your health care provider may also give you more specific instructions. If you have problems or questions, contact your health care provider. What can I expect after the procedure? After the procedure, it is common to have:  Bruising and tenderness at the catheter insertion area. Follow these instructions at home: Medicines  Take over-the-counter and prescription medicines only as told by your health care provider. Insertion site care  Follow instructions from your health care provider about how to take care of your insertion site. Make sure you: ? Wash your hands with soap and water before you change your bandage (dressing). If soap and water are not available, use hand sanitizer. ? Change your dressing as told by your health care provider. ? Leave stitches (sutures), skin glue, or adhesive strips in place. These skin closures may need to stay in place for 2 weeks or longer. If adhesive strip edges start to loosen and curl up, you may trim the loose edges. Do not remove adhesive strips completely unless your health care provider tells you to do that.  Check your insertion site every day for signs of infection. Check for: ? Redness, swelling, or pain. ? Fluid or blood. ? Pus or a bad smell. ? Warmth.  Do not take baths, swim, or use a hot tub until your health care provider approves.  You may shower 24-48 hours after the procedure, or as directed by your health care provider. ? Remove the dressing and gently wash the site with plain soap and water. ? Pat the area dry with a clean towel. ? Do not rub the site. That could cause bleeding.  Do not apply powder or lotion to the site. Activity   For 24 hours after the procedure, or as directed by your health care provider: ? Do not flex or bend the affected arm. ? Do not push or pull heavy objects with the affected arm. ? Do not  drive yourself home from the hospital or clinic. You may drive 24 hours after the procedure unless your health care provider tells you not to. ? Do not operate machinery or power tools.  Do not lift anything that is heavier than 10 lb (4.5 kg), or the limit that you are told, until your health care provider says that it is safe.  Ask your health care provider when it is okay to: ? Return to work or school. ? Resume usual physical activities or sports. ? Resume sexual activity. General instructions  If the catheter site starts to bleed, raise your arm and put firm pressure on the site. If the bleeding does not stop, get help right away. This is a medical emergency.  If you went home on the same day as your procedure, a responsible adult should be with you for the first 24 hours after you arrive home.  Keep all follow-up visits as told by your health care provider. This is important. Contact a health care provider if:  You have a fever.  You have redness, swelling, or yellow drainage around your insertion site. Get help right away if:  You have unusual pain at the radial site.  The catheter insertion area swells very fast.  The insertion area is bleeding, and the bleeding does not stop when you hold steady pressure on the area.  Your arm or hand becomes pale, cool, tingly, or numb. These symptoms may represent a serious problem   that is an emergency. Do not wait to see if the symptoms will go away. Get medical help right away. Call your local emergency services (911 in the [place] Do not drive yourself to the hospital. Summary  After the procedure, it is common to have bruising and tenderness at the site.  Follow instructions from your health care provider about how to take care of your radial site wound. Check the wound every day for signs of infection.  Do not lift anything that is heavier than 10 lb (4.5 kg), or the limit that you are told, until your health care provider says  that it is safe. This information is not intended to replace advice given to you by your health care provider. Make sure you discuss any questions you have with your health care provider. Document Revised: 11/02/2017 Document Reviewed: 11/02/2017 Elsevier Patient Education  2020 [employer]  

## 2020-05-13 NOTE — Interval H&P Note (Signed)
History and Physical Interval Note:  05/13/2020 3:42 PM  Stanley Boyd  has presented today for surgery, with the diagnosis of CAD.  The various methods of treatment have been discussed with the patient and family. After consideration of risks, benefits and other options for treatment, the patient has consented to  Procedure(s): LEFT HEART CATH AND CORONARY ANGIOGRAPHY (N/A) and possible angioplasty as a surgical intervention.  The patient's history has been reviewed, patient examined, no change in status, stable for surgery.  I have reviewed the patient's chart and labs.  Questions were answered to the patient's satisfaction.   Cath Lab Visit (complete for each Cath Lab visit)  Clinical Evaluation Leading to the Procedure:   ACS: No.  Non-ACS:    Anginal Classification: CCS III  Anti-ischemic medical therapy: No Therapy  Non-Invasive Test Results: No non-invasive testing performed  Prior CABG: No previous CABG  Patient has atypical presentation with ACS and CAD. Stress test un yielding. Patient not on BB or ACEi due to low BP. Patient prefers coronary angiogram.  Adrian Prows

## 2020-05-14 ENCOUNTER — Encounter (HOSPITAL_COMMUNITY): Payer: Self-pay | Admitting: Cardiology

## 2020-05-15 ENCOUNTER — Encounter (HOSPITAL_COMMUNITY): Payer: Medicare Other

## 2020-05-16 ENCOUNTER — Encounter: Payer: Self-pay | Admitting: Neurology

## 2020-05-16 ENCOUNTER — Other Ambulatory Visit (INDEPENDENT_AMBULATORY_CARE_PROVIDER_SITE_OTHER): Payer: Medicare Other

## 2020-05-16 ENCOUNTER — Other Ambulatory Visit: Payer: Self-pay

## 2020-05-16 ENCOUNTER — Ambulatory Visit: Payer: Medicare Other | Admitting: Neurology

## 2020-05-16 ENCOUNTER — Ambulatory Visit (INDEPENDENT_AMBULATORY_CARE_PROVIDER_SITE_OTHER): Payer: Medicare Other | Admitting: Neurology

## 2020-05-16 VITALS — BP 129/73 | HR 57 | Ht 69.0 in | Wt 204.0 lb

## 2020-05-16 DIAGNOSIS — Z5181 Encounter for therapeutic drug level monitoring: Secondary | ICD-10-CM | POA: Diagnosis not present

## 2020-05-16 DIAGNOSIS — H469 Unspecified optic neuritis: Secondary | ICD-10-CM

## 2020-05-16 DIAGNOSIS — G7249 Other inflammatory and immune myopathies, not elsewhere classified: Secondary | ICD-10-CM | POA: Diagnosis not present

## 2020-05-16 LAB — SEDIMENTATION RATE: Sed Rate: 38 mm/hr — ABNORMAL HIGH (ref 0–20)

## 2020-05-16 LAB — TSH: TSH: 4.7 u[IU]/mL — ABNORMAL HIGH (ref 0.35–4.50)

## 2020-05-16 LAB — C-REACTIVE PROTEIN: CRP: <1 mg/dL (ref 0.5–20.0)

## 2020-05-16 NOTE — Patient Instructions (Addendum)
Start vitamin B12 1080mcg daily.  Stop injections  Adjust IVIG to 1 day only every 21 days

## 2020-05-16 NOTE — Progress Notes (Signed)
Follow-up Visit   Date: 05/16/20   Stanley Boyd MRN: [id number] DOB: 1950-10-20   Interim History: Stanley Boyd is a 69 y.o. 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  The patient was accompanied to the clinic by wife who also provides collateral information.    History of present illness: Early April 2020, he began having difficulty walking, which progressed into inability to raise his legs even to climb stairs. Around the same time, he developed a skin rash of the chest and forearm.  He was evaluated by his PCP who noted 4-/5 muscle strength in the legs and [place] [address].  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  MRI left femur was most notable with diffuse, symmetric muscle edema and enhancement in the proximal hip girdle muscles. CK peaked at [address]. Left rectus femoris muscle biopsy and skin biopsy was performed.  He was emperically started on prednisone 72m due to high clinical suspicion for inflammatory myopathy, despite normal inflammatory/autoimmune markers (see below).  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  Skin biopsy showed benign lesion with fibrosis and mixed superficial and deep inflammation.  He has a history of CAD, followed by Dr. GEinar Gip  He was previously on statin therapy for ~ 2 years, but due to elevated liver enzymes, he was switched Praluent in 2019.  His LFTs have been elevated in the past, [facility], [facility] 61*, ALT 90*, alk phos 126*.  He  denies history of myalgias or cramps.    In late May, he established care with me and checked HMGCR antibodies which returned markedly elevated. In July, he started IVIG nd Cellcept 5074mBID, which has allowed tapering of prednisone.    [facility], who agreed with management plan.  . Marland Kitchen UPDATE 05/16/2020:  [facility] had extensive evaluation with MRI/V brain, LP, and autoimmune labs which were normal.  Lumbar puncture demonstrated normal opening pressure of 15 with cell count 0, protein 65, glucose 72,  IgG index 0.57, myelin basic protein <2, and ACE 5.  Labs were unremarkable, including NMO/AQP4 negative, anti-MOG negative, sed rate 30, CRP <5, B12 975, folate 20.4, B1 106, copper 1.05, vit E 9.8 and D 25-hydroxy 30. Hgb A1c was 7.3.  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  His prednisone was slowly tapered and he has been off prednisone for the past two weeks.  His vision remains impaired in the left eye with field cut sparing only a small nasal portion of vision. Earlier this week, he began having a haze over the right eye, similar to what he experienced in the left eye in the spring. Understandably, he is very concerned and [facility].   Because of ongoing fatigue, he also underwent cardiac catherization this week  which showed mild CAD, nothing which needed intervention or explain his symptoms.  With respect to his myopathy, symptoms are stable and there is no new weakness. CK has normalized. He was started on IVIG 38m/kg x 2 doses every 21 days over the summer, thinking that this may help an autoimmune process, if causing his optic neuropathy, but he has not  appreciated any difference, only feeling more fatigued.      Medications:  Current Outpatient Medications on File Prior to Visit  Medication Sig Dispense Refill  . aspirin 81 MG chewable tablet Chew 1 tablet (81 mg total) by mouth at bedtime.    . Carboxymethylcellulose Sodium (REFRESH TEARS OP) Place 1 drop into both eyes daily as needed (dry eyes).     . cyanocobalamin (,VITAMIN B-12,) 1000 MCG/ML injection Inject IM daily for 7 days, then weekly for 4 weeks, then monthly (Patient taking differently: Inject 1,000 mcg into the muscle every 30 (thirty) days. ) 6 mL 0  . ezetimibe (ZETIA) 10 MG tablet TAKE 1 TABLET BY MOUTH ONCE DAILY AFTER SUPPER. (Patient taking differently: Take 10 mg by mouth daily. ) 30 tablet 0  . finasteride (PROSCAR) 5 MG tablet Take 5 mg by mouth at bedtime.     . fluticasone (FLONASE) 50 MCG/ACT nasal spray Place 2 sprays into both nostrils daily as needed for allergies or rhinitis.    .Marland Kitchenicosapent Ethyl (VASCEPA) 1 g capsule Take 2 capsules (2 g total) by mouth 2 (two) times daily. 120 capsule 6  . Immune Globulin 10% (IMMUNE GLOBULIN 10%) 10G/105m(10,00075m00mL) SOLN Inject 1 g/kg into the vein See admin instructions. 75 g every 21 days for 1 consecutive days    . metFORMIN (GLUCOPHAGE) 500 MG tablet Take 1-2 tablets (500-1,000 mg total) by mouth 2 (two) times daily with a meal. Take 1000m48m morning with breakfast and 500mg31mh dinner (Patient taking differently: Take 500 mg by mouth 2 (two) times daily with a meal. ) 270 tablet 3  . mycophenolate (CELLCEPT) 500 MG tablet Take 1.5 tablets (750 mg total) by mouth 2 (two) times daily. 270 tablet 3  . nitroGLYCERIN (NITROSTAT) 0.4 MG SL tablet Place 1 tablet (0.4 mg total) under the tongue every 5 (five) minutes as needed for chest pain. 25 tablet 4  . PRALUENT 150 MG/ML SOAJ INJECT CONTENTS OF 1 PEN SUBCUTANEOUSLY EVERY TWO WEEKS (Patient taking differently: Inject 150 mg into the skin every 14 (fourteen) days. ) 2 mL  6  . Syringe/Needle, Disp, (SYRINGE 3CC/22GX1") 22G X 1" 3 ML MISC Use with B12 injections 10 each 0  . tamsulosin (FLOMAX) 0.4 MG CAPS capsule Take 0.4 mg by mouth at bedtime.     No current facility-administered medications on file prior to visit.    Allergies:  Allergies  Allergen Reactions  . Statins Other (See Comments)    Myositis Myositis    Vital Signs:  BP 129/73 (BP Location: Left Arm, Patient Position: Sitting, Cuff Size: Large)   Pulse (!) 57   Ht _0  (1.753 m)   Wt 204 lb (92.5 kg)   SpO2 98%   BMI 30.13 kg/m   Neurological Exam: MENTAL STATUS including orientation to time, place, person, recent and remote memory, attention span and concentration, language, and fund of knowledge is normal.  Speech is not dysarthric.  CRANIAL NERVES: Fundoscopic exam shows R papilledema.  There is left-central visual field deficit in the left eye .  Visual field in the right eye is normal,  although he reports reduced acuity Pupils equal round and reactive to light.  Normal conjugate, extra-ocular eye movements in all directions of gaze.  No ptosis. Face is symmetric. Facial muscles are intact 5/5.  MOTOR:  No atrophy, fasciculations or abnormal movements.  No pronator drift.   Upper Extremity:  Right  Left  Deltoid  5/5   5/5   Biceps  5/5   5/5   Triceps  5/5   5/5   Infraspinatus 5/5  5/5  Medial pectoralis 5/5  5/5  Wrist extensors  5-/5   5/5   Wrist flexors  5-/5   5/5   Finger extensors  4/5   5/5   Finger flexors  4/5   5/5   Dorsal interossei  4/5   5/5   Abductor pollicis  4/5   5/5   Tone (Ashworth scale)  0  0   Lower Extremity:  Right  Left  Hip flexors  5/5   5/5   Hip extensors  5/5   5/5   Adductor 5/5  5/5  Abductor 5/5  5/5  Knee flexors  5/5   5/5   Knee extensors  5/5   5/5   Dorsiflexors  5/5   5/5   Plantarflexors  5/5   5/5   Toe extensors  5/5   5/5   Toe flexors  5/5   5/5   Tone (Ashworth scale)  0  0   COORDINATION/GAIT:   Gait narrow  based and stable.   Data: MRI cervical spine without contrast 02/17/2019: 1. 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 2. Solid anterior fusion at C4-5 and C5-6 without spinal stenosis at these levels. 3. Adjacent segment degenerative changes with moderate spinal stenosis at C6-7, mild-to-moderate spinal stenosis at C3-4, and mild-to-moderate neural foraminal stenosis at both levels.  MRI lumbar spine 02/17/2019: 1. Relatively diffuse lumbar disc and facet degeneration without significant spinal stenosis. 2. Multilevel neural foraminal stenosis, moderate on the left at L5-S1. 3. Normal appearance of the conus medullaris and cauda equina.  MRI thoracic spine without contrast 02/17/2019: 1. Mild thoracic disc and facet degeneration without evidence of neural impingement. 2. Minimal prominence of the central canal of the mid to lower thoracic spinal cord without syrinx.   Left rectus femorus muscle biopsy 02/20/2019:  Active necrotizing inflammatory myopathy. Right chest wall skin biopsy:  Benign skin with fibrosis and mixed superficial and deep inflammation  Neuromuscular Lab - [facility] 03/20/2019:  Myopathy 2 panel - HMGCR 36,000 (normal <2500)  Lab Results  Component Value Date   CKTOTAL 331 05/06/2020   CKMB CANCELED 03/21/2020   TROPONINI 0.03 (HH) 02/17/2019    Lab Results  Component Value Date   HGBA1C 7.4 (H) 04/18/2020     IMPRESSION/PLAN: 1.  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  Clinically stable.  Exam without motor deficits and CK is 361 > 331.  He has been off prednisone and remains asymptomatic from myopathy standpoint, suggesting disease is being controlled on Cellcept and IVIG.  I do not think higher dose of IVIG is playing a therapeutic role, so will scale back the dose to 46m/kg every 21 days.  - Reduce IVIG to 174mkg every 21 days  - Continue Cellcept 75049mwice daily  - Check CK monthly  2.  Optic neuropathy OS > OD, now with new visual  complaints in the right eye.    - [facility] which has not revealed etiology.  There has  been concern whether this is autoimmune (especially given associated myopathy) vs ischemic, however since he has been on adequate immunotherapy, this technically should treat any autoimmune-mediated process, so diagnosis was favoring ischemic process.     - He is scheduled to have urgent evaluation with [facility] today to evaluate symptoms  - Check ESR, CRP, and TSH  3.  Vitamin B12 deficiency, transition to oral 1051mg daily.  Stop injections.    Return to clinic in 6 weeks  Thank you for allowing me to participate in patient's care.  If I can answer any additional questions, I would be pleased to do so.    Sincerely,    Juanna Pudlo K. PPosey Pronto DO

## 2020-05-19 ENCOUNTER — Inpatient Hospital Stay (HOSPITAL_COMMUNITY): Admission: RE | Admit: 2020-05-19 | Payer: Medicare Other | Source: Ambulatory Visit

## 2020-05-19 ENCOUNTER — Other Ambulatory Visit: Payer: Self-pay

## 2020-05-19 DIAGNOSIS — G7249 Other inflammatory and immune myopathies, not elsewhere classified: Secondary | ICD-10-CM

## 2020-05-19 LAB — T4: T4, Total: 7.6 ug/dL (ref 4.9–10.5)

## 2020-05-19 LAB — T3: T3, Total: 121 ng/dL (ref 76–181)

## 2020-05-20 ENCOUNTER — Inpatient Hospital Stay (HOSPITAL_COMMUNITY): Admission: RE | Admit: 2020-05-20 | Payer: Medicare Other | Source: Ambulatory Visit

## 2020-05-20 ENCOUNTER — Other Ambulatory Visit: Payer: Self-pay | Admitting: Cardiology

## 2020-05-20 DIAGNOSIS — I2510 Atherosclerotic heart disease of native coronary artery without angina pectoris: Secondary | ICD-10-CM

## 2020-05-22 ENCOUNTER — Ambulatory Visit (HOSPITAL_COMMUNITY): Payer: Medicare Other

## 2020-05-23 NOTE — Telephone Encounter (Signed)
For review from patient

## 2020-05-26 ENCOUNTER — Inpatient Hospital Stay (HOSPITAL_COMMUNITY): Admission: RE | Admit: 2020-05-26 | Payer: Medicare Other | Source: Ambulatory Visit

## 2020-05-26 DIAGNOSIS — H53453 Other localized visual field defect, bilateral: Secondary | ICD-10-CM | POA: Insufficient documentation

## 2020-05-26 DIAGNOSIS — H4710 Unspecified papilledema: Secondary | ICD-10-CM | POA: Insufficient documentation

## 2020-05-27 IMAGING — MR MR HEAD WO/W CM
15 series · 48 of 48 positions shown · IV contrast (gadavist)
Comparison: Concurrently performed MR venography of the head.

CLINICAL DATA: Papilledema. Rule out IIH; focal neuro deficit,
greater than 6 hours, stroke suspected. Additional history provided
by scanning [HOSPITAL]week of seeing black spots in left eye,
papilledema associated with intracranial pressure.

EXAM:
MRI HEAD WITHOUT AND WITH CONTRAST
TECHNIQUE: Multiplanar, multiecho pulse sequences of the brain and surrounding
structures were obtained without and with intravenous contrast.
CONTRAST:  9mL GADAVIST GADOBUTROL 1 MMOL/ML IV SOLN

[Series 5: ax dwi_tracew · axial · 3.0mm · 0.60mm/px · z∈[-56,+98]mm · 3 of 48 slices shown]
[im 1/48]
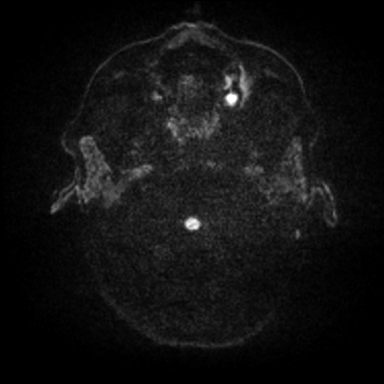
[im 24/48]
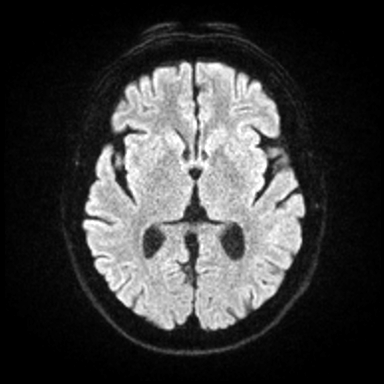
[im 48/48]
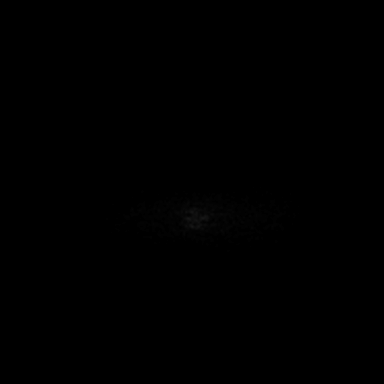

[Series 6: ax dwi_adc · axial · 3.0mm · 0.60mm/px · z∈[-56,+95]mm · 3 of 47 slices shown]
[im 1/47]
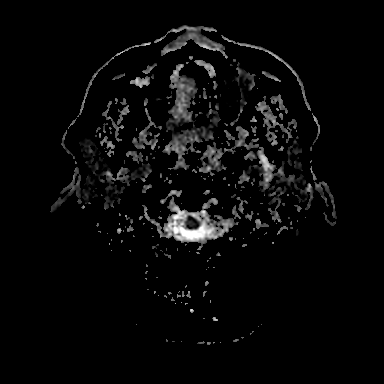
[im 24/47]
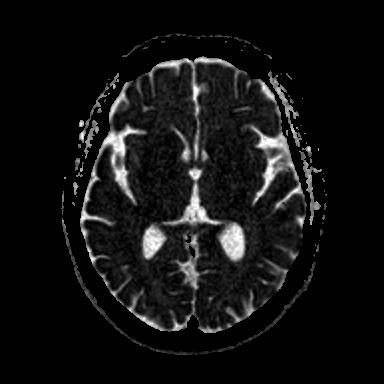
[im 47/47]
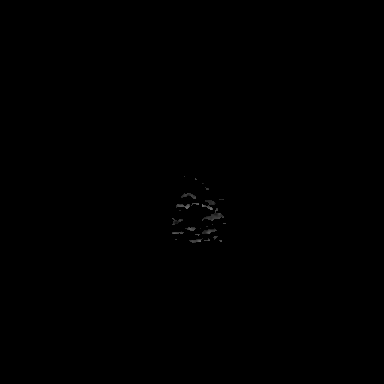

[Series 7: cor dwi_tracew · coronal · 5.0mm · 0.60mm/px · 2 of 40 slices shown]
[im 1/40]
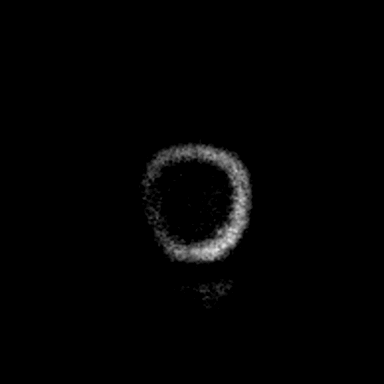
[im 40/40]
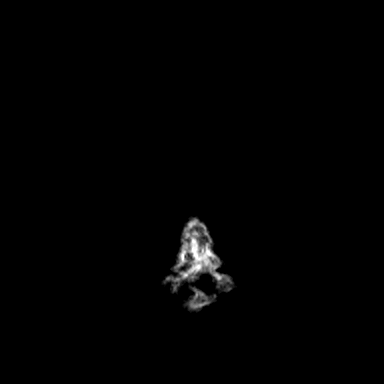

[Series 8: cor dwi_adc · coronal · 5.0mm · 0.60mm/px · 2 of 40 slices shown]
[im 1/40]
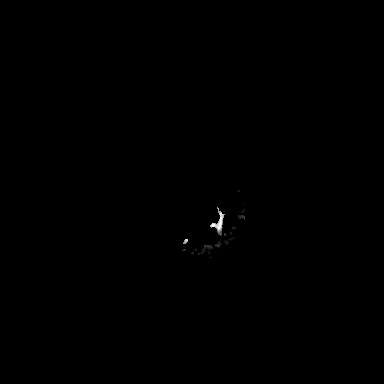
[im 40/40]
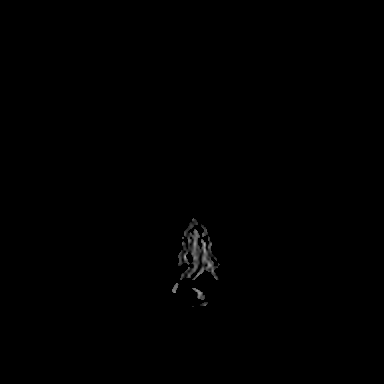

[Series 9: T1 · sagittal · 5.0mm · 0.62mm/px · 1 of 25 slices shown (1 of 2)]
[im 1/25]
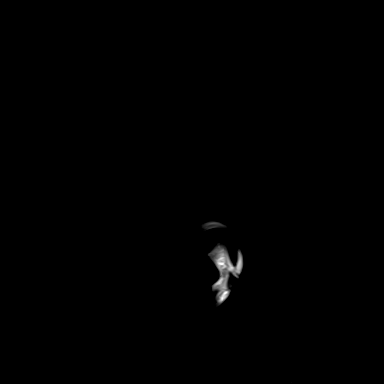

[Series 10: T2 · axial · 5.0mm · 0.53mm/px · 1 of 26 slices shown]
[im 1/26]
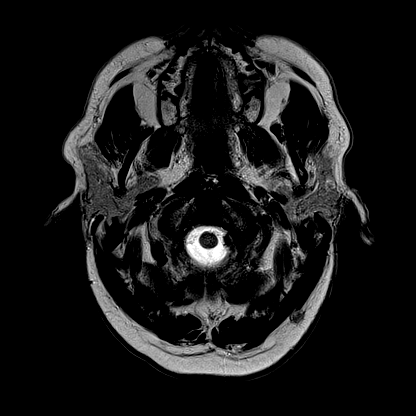

[Series 12: pha_images · axial · 3.0mm · 0.90mm/px · z∈[-68,+105]mm · 2 of 58 slices shown]
[im 1/58]
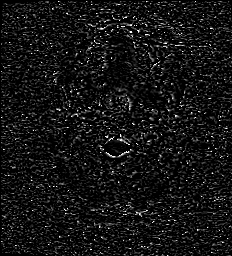
[im 58/58]
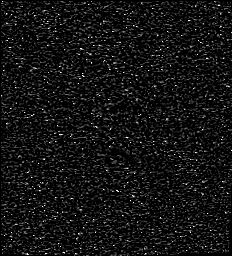

[Series 13: swi_images · axial · 3.0mm · 0.90mm/px · z∈[-68,+108]mm · 3 of 60 slices shown]
[im 1/60]
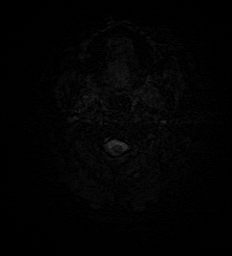
[im 30/60]
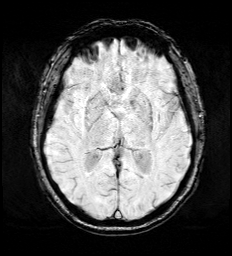
[im 60/60]
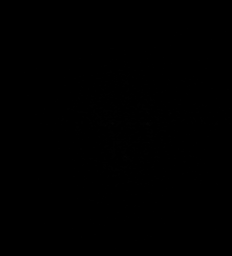

[Series 15: FLAIR · axial · 3.0mm · 0.53mm/px · z∈[-60,+101]mm · 2 of 55 slices shown]
[im 1/55]
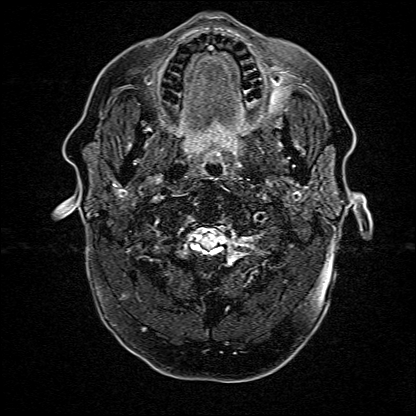
[im 55/55]
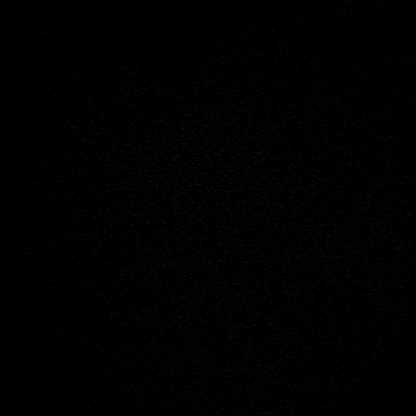

[Series 16: T1 · axial · 1.0mm · 0.98mm/px · z∈[-65,+108]mm · 7 of 175 slices shown (2 of 2)]
[im 1/175]
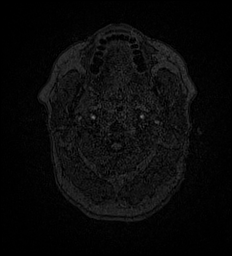
[im 30/175]
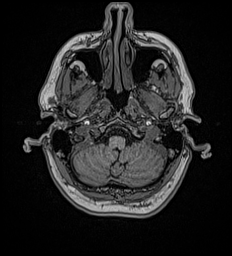
[im 59/175]
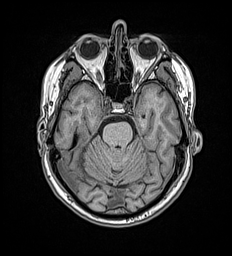
[im 88/175]
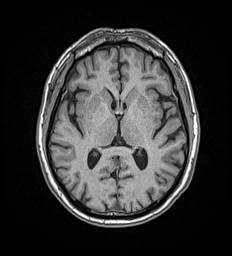
[im 117/175]
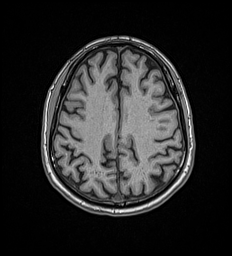
[im 146/175]
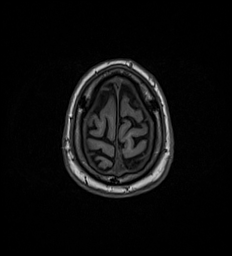
[im 175/175]
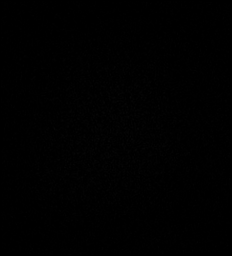

[Series 17: tof_2d_paracor · coronal · 2.5mm · 0.98mm/px · 5 of 128 slices shown]
[im 1/128]
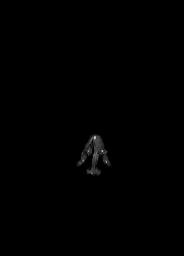
[im 32/128]
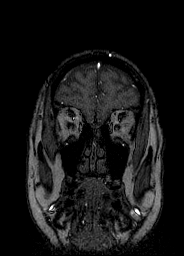
[im 64/128]
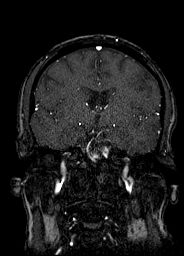
[im 96/128]
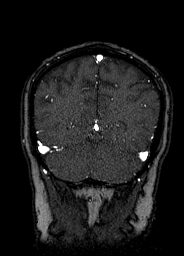
[im 128/128]
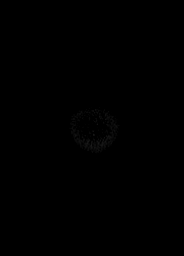

[Series 21: t1_mprage_sag_p2_iso · sagittal · 1.0mm · 0.98mm/px · 8 of 192 slices shown]
[im 1/192]
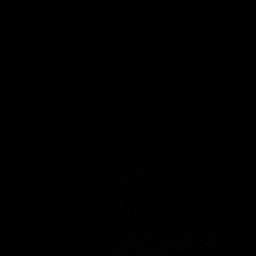
[im 28/192]
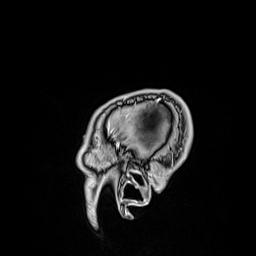
[im 55/192]
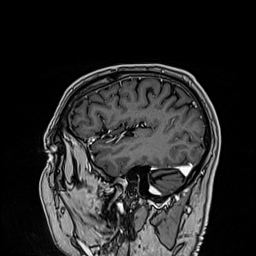
[im 82/192]
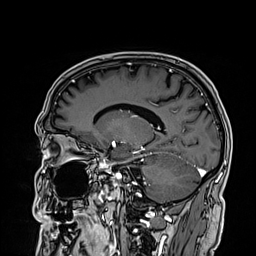
[im 110/192]
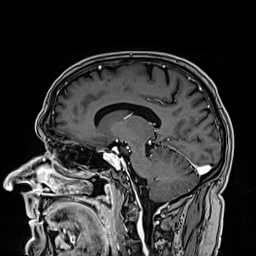
[im 137/192]
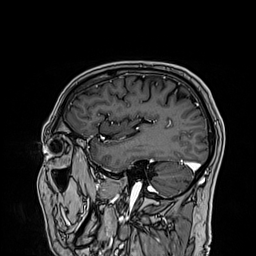
[im 164/192]
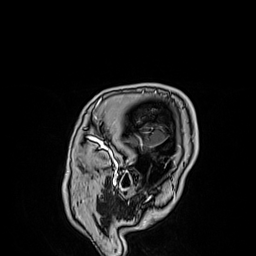
[im 192/192]
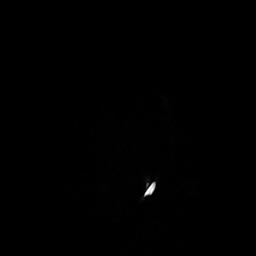

[Series 22: T2 post-contrast · coronal · 5.0mm · 0.57mm/px · 1 of 29 slices shown]
[im 1/29]
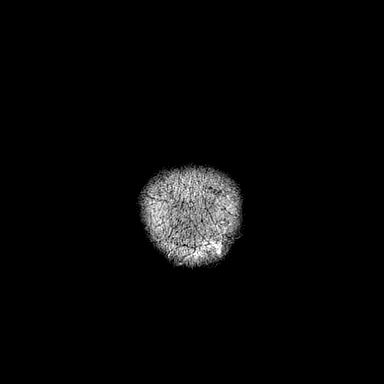

[Series 23: T1 post-contrast · axial · 1.0mm · 0.98mm/px · z∈[-65,+108]mm · 7 of 176 slices shown (1 of 2)]
[im 1/176]
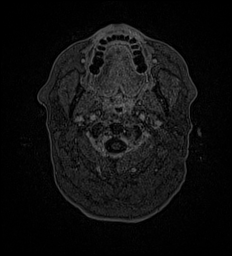
[im 30/176]
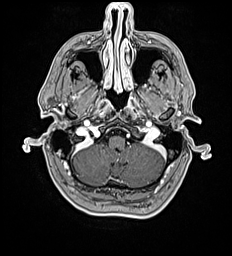
[im 59/176]
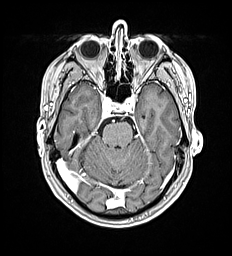
[im 88/176]
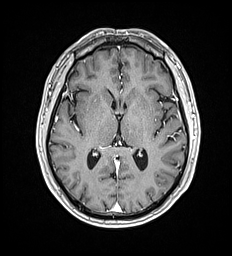
[im 117/176]
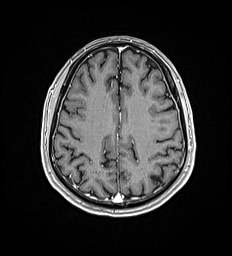
[im 146/176]
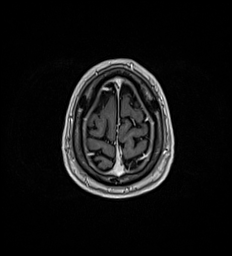
[im 176/176]
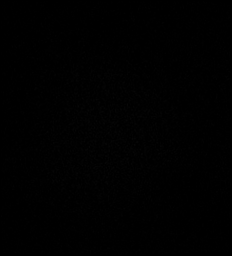

[Series 24: T1 post-contrast · coronal · 5.0mm · 0.57mm/px · 1 of 29 slices shown (2 of 2)]
[im 1/29]
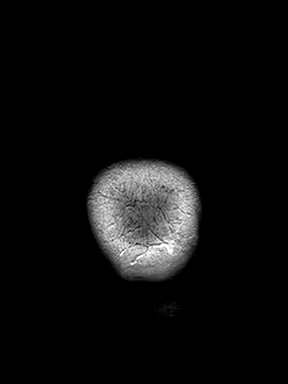

[48 of 48 positions shown; findings below may reference images not displayed]

FINDINGS: Brain:

There is no evidence of acute infarct.

No evidence of intracranial mass.

No midline shift or extra-axial fluid collection.

No chronic intracranial blood products.

No significant white matter disease for age.

No abnormal intracranial enhancement is demonstrated.

Cerebral volume is normal for age.

Vascular: Flow voids maintained within the proximal large arterial
vessels. Non-dominant intracranial right vertebral artery.

Skull and upper cervical spine: Normal marrow signal.

Sinuses/Orbits: Visualized orbits demonstrate no acute abnormality.
Trace ethmoid sinus mucosal thickening. No significant mastoid
effusion.
IMPRESSION: Unremarkable MRI appearance of the brain for age. No evidence of
intracranial mass or acute intracranial abnormality.

No specific findings to suggest idiopathic intracranial
hypertension. Consider lumbar puncture with opening pressure for
further evaluation.

Trace ethmoid sinus mucosal thickening.

## 2020-06-02 ENCOUNTER — Ambulatory Visit (HOSPITAL_COMMUNITY)
Admission: RE | Admit: 2020-06-02 | Discharge: 2020-06-02 | Disposition: A | Discharge status: Home / Self Care | Payer: Medicare Other | Source: Ambulatory Visit | Attending: Neurology | Admitting: Neurology

## 2020-06-02 ENCOUNTER — Other Ambulatory Visit: Payer: Self-pay

## 2020-06-02 DIAGNOSIS — G7249 Other inflammatory and immune myopathies, not elsewhere classified: Secondary | ICD-10-CM | POA: Insufficient documentation

## 2020-06-02 MED ORDER — IMMUNE GLOBULIN (HUMAN) 10 GM/100ML IV SOLN
1.0000 g/kg | INTRAVENOUS | Status: DC
  Administered 2020-06-02: 90 g via INTRAVENOUS
  Filled 2020-06-02: qty 800

## 2020-06-06 ENCOUNTER — Encounter: Payer: Self-pay | Admitting: Cardiology

## 2020-06-06 ENCOUNTER — Other Ambulatory Visit: Payer: Self-pay

## 2020-06-06 ENCOUNTER — Ambulatory Visit: Payer: Medicare Other | Admitting: Cardiology

## 2020-06-06 VITALS — BP 136/68 | HR 62 | Resp 16 | Ht 69.0 in | Wt 197.0 lb

## 2020-06-06 DIAGNOSIS — E7800 Pure hypercholesterolemia, unspecified: Secondary | ICD-10-CM

## 2020-06-06 DIAGNOSIS — I2510 Atherosclerotic heart disease of native coronary artery without angina pectoris: Secondary | ICD-10-CM

## 2020-06-06 DIAGNOSIS — H4720 Unspecified optic atrophy: Secondary | ICD-10-CM

## 2020-06-06 DIAGNOSIS — G7249 Other inflammatory and immune myopathies, not elsewhere classified: Secondary | ICD-10-CM

## 2020-06-06 NOTE — Progress Notes (Signed)
Primary Physician/Referring:  Marin Olp, MD  Patient ID: Stanley Boyd, male    DOB: 1951-03-16, 69 y.o.   MRN: [id number]  Chief Complaint  Patient presents with  . Follow-up    4 week  . Post Cath   HPI:    Stanley Boyd  is a 69 y.o. Caucasian male with CAD,staged PCI, first on 07/08/2015 with 3.0x38 & 3.0x64mm Resolute DES to prox and mid LAD, then on 07/17/2015 with stenting of the bifurcating large OM1 and proximal circumflex with implantation of 3.0 x 28 mm in the circumflex and 3.0 x 24 mm Synergy DES with [employer]otte [employer]. 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  He has not been on statins for a long time even before his presentation.  Praluent was discontinued.  He underwent extensive evaluation by neurology, he was eventually diagnosed with having HMGCR antibodies by Dr. Narda Amber, neurology. He is now being treated with IV Ig and steroids and Cellcept. He is now back Praluent since 07/06/2019.  He has had atypical presentation with his coronary artery disease in the past, recently has been having worsening dyspnea, marked fatigue and hence with high suspicion that he may have progression of coronary disease underwent cardiac catheterization on 05/13/2020 fortunately revealing widely patent stents.  States that he has returned back to activity and is started to exercise at the [facility] and has noticed gradual improvement in his energy levels.  Past Medical History:  Diagnosis Date  . BPH (benign prostatic hyperplasia)   . Bradycardia    40s to low 50s  . Chronic back pain    multiple surgeries following fall from a deer stand  . Coronary artery disease    5 stents  . Headache    "probably monthly" (07/08/2015)  . Hyperlipidemia    on injectable medication  .  Kidney stones    "years ago; never had OR/scopes"  . 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 Neuromuscular disorder ([place])   . Pneumonia 08/2014   Past Surgical History:  Procedure Laterality Date  . ANTERIOR CERVICAL DECOMP/DISCECTOMY FUSION  59; 55; [place]    . BIOPSY PROSTATE  2017  . CARDIAC CATHETERIZATION N/A 07/08/2015   Procedure: Left Heart Cath and Coronary Angiography;  Surgeon: Adrian Prows, MD;  Location: [place] [facility];  Service: Cardiovascular;  Laterality: N/A;  . CARDIAC CATHETERIZATION N/A 07/17/2015   Procedure: Coronary Stent Intervention;  Surgeon: Adrian Prows, MD;  Location: [place] [facility];  Service: Cardiovascular;  Laterality: N/A;  . CARDIAC CATHETERIZATION N/A 04/16/2016   Procedure: Left Heart Cath and Coronary Angiography;  Surgeon: Adrian Prows, MD;  Location: [place] [facility];  Service: Cardiovascular;  Laterality: N/A;  . CARDIAC CATHETERIZATION N/A 04/16/2016   Procedure: Coronary Stent Intervention;  Surgeon: Adrian Prows, MD;  Location: [place] [facility];  Service: Cardiovascular;  Laterality: N/A;  . COLONOSCOPY N/A 04/05/2014   Procedure: COLONOSCOPY;  Surgeon: Danie Binder, MD;  [facility];  Service: Endoscopy;  Laterality: N/A;  10:30 AM  . COLONOSCOPY N/A 01/25/2020   Procedure: COLONOSCOPY;  Surgeon: Danie Binder, MD;  [facility];  Service: Endoscopy;  Laterality: N/A;  9:30  . CORONARY ANGIOPLASTY WITH STENT PLACEMENT  07/08/2015   "2 stents"  .  CORONARY STENT PLACEMENT  04/16/2016   PTCA and stenting of the ostial LAD with implantation of a 3.0 x 12 mm resolute integrity DES, stenosis reduced from 80% to 0% with maintenance of TIMI-3 flow. Ostial OM1 in-stent restenosis reduced to less than 20% with a 2.0 x 12 mm emerge balloon at 14 atmospheric pressure  . LEFT HEART CATH AND CORONARY ANGIOGRAPHY N/A 05/13/2020   Procedure: LEFT HEART CATH AND CORONARY ANGIOGRAPHY;  Surgeon: Adrian Prows, MD;   Location: [place] [facility];  Service: Cardiovascular;  Laterality: N/A;  . LIVER BIOPSY  03/2019  . [place] SURGERY  1984  . MUSCLE BIOPSY N/A 02/20/2019   Procedure: LEFT QUADRICEP MUSCLE  BIOPSY AND PUNCH BIOPSY OF RIGHT CHEST;  Surgeon: Ileana Roup, MD;  Location: [place];  Service: General;  Laterality: N/A;  . POLYPECTOMY  01/25/2020   Procedure: POLYPECTOMY;  Surgeon: Danie Binder, MD;  [facility];  Service: Endoscopy;;  sigmoid  . PTCA  07/17/2015   OMI    DES   Social History   Tobacco Use  . Smoking status: Never Smoker  . Smokeless tobacco: Never Used  Substance Use Topics  . Alcohol use: No    Alcohol/week: 0.0 standard drinks   Marital Status: Married   ROS  Review of Systems  Cardiovascular: Negative for chest pain, dyspnea on exertion and leg swelling.  Gastrointestinal: Negative for melena.    Objective   Vitals with BMI 06/06/2020 06/02/2020 06/02/2020  Height 5\' 9"  - -  Weight 197 lbs - -  BMI 76.19 - -  Systolic 509 326 712  Diastolic 68 66 65  Pulse 62 58 49    Blood pressure 136/68, pulse 62, resp. rate 16, height 5\' 9"  (1.753 m), weight 197 lb (89.4 kg), SpO2 96 %. Body mass index is 29.09 kg/m.   Physical Exam Cardiovascular:     Rate and Rhythm: Normal rate and regular rhythm.     Pulses: Intact distal pulses.     Heart sounds: Normal heart sounds. No murmur heard.  No gallop.      Comments: No leg edema, no JVD. Pulmonary:     Effort: Pulmonary effort is normal.     Breath sounds: Normal breath sounds.  Abdominal:     General: Bowel sounds are normal.     Palpations: Abdomen is soft.     Radiology: No results found.  Laboratory examination:   Recent Labs    03/21/20 1123 04/18/20 1017 05/06/20 1355  NA 137 136 135  K 4.1 4.5 4.4  CL 101 98 99  CO2 22 27 24   GLUCOSE 167* 112* 96  BUN 16 14 18   CREATININE 1.04 0.96 1.00  CALCIUM 9.7 10.0 9.7  GFRNONAA 74 81 78  GFRAA 86 94 90   CMP Latest Ref Rng &  Units 05/06/2020 04/18/2020 03/21/2020  Glucose 65 - 99 mg/dL 96 112(H) 167(H)  BUN 8 - 27 mg/dL 18 14 16   Creatinine 0.76 - 1.27 mg/dL 1.00 0.96 1.04  Sodium 134 - 144 mmol/L 135 136 137  Potassium 3.5 - 5.2 mmol/L 4.4 4.5 4.1  Chloride 96 - 106 mmol/L 99 98 101  CO2 20 - 29 mmol/L 24 27 22   Calcium 8.6 - 10.2 mg/dL 9.7 10.0 9.7  Total Protein 6.0 - 8.5 g/dL 9.4(H) 8.7(H) 8.7(H)  Total Bilirubin 0.0 - 1.2 mg/dL 0.4 0.4 0.3  Alkaline Phos 48 - 121 IU/L 59 66 61  AST 0 -  40 IU/L 46(H) 53(H) 49(H)  ALT 0 - 44 IU/L 59(H) 72(H) 66(H)   CBC Latest Ref Rng & Units 05/06/2020 04/18/2020 03/21/2020  WBC 3.4 - 10.8 x10E3/uL 4.0 4.7 4.5  Hemoglobin 13.0 - 17.7 g/dL 13.4 13.8 13.9  Hematocrit 37.5 - 51.0 % 40.7 40.6 41.3  Platelets 150 - 450 x10E3/uL 205 209 212   Lipid Panel     Component Value Date/Time   CHOL 117 04/18/2020 1017   TRIG 155 (H) 04/18/2020 1017   HDL 40 04/18/2020 1017   CHOLHDL 6 06/28/2019 0933   VLDL 46.2 (H) 06/28/2019 0933   LDLCALC 51 04/18/2020 1017   LDLDIRECT 58 02/26/2020 1026   LDLDIRECT 216.0 06/28/2019 0933   HEMOGLOBIN A1C Lab Results  Component Value Date   HGBA1C 7.4 (H) 04/18/2020   MPG 154 03/20/2019   TSH Recent Labs    01/09/20 0000 01/09/20 1131 05/16/20 1418  TSH 4.26 4.260 4.70*   Medications and allergies   Allergies  Allergen Reactions  . Statins Other (See Comments)    Myositis Myositis     Current Outpatient Medications  Medication Instructions  . aspirin 81 mg, Oral, Daily at bedtime  . brimonidine (ALPHAGAN) 0.2 % ophthalmic solution INSTILL 1 DROP INTO RIGHT EYE THREE TIMES DAILY  . Carboxymethylcellulose Sodium (REFRESH TEARS OP) 1 drop, Both Eyes, Daily PRN  . cyanocobalamin (,VITAMIN B-12,) 1000 MCG/ML injection Inject IM daily for 7 days, then weekly for 4 weeks, then monthly  . ezetimibe (ZETIA) 10 MG tablet TAKE 1 TABLET BY MOUTH ONCE DAILY AFTER SUPPER  . finasteride (PROSCAR) 5 mg, Oral, Daily at bedtime  .  fluticasone (FLONASE) 50 MCG/ACT nasal spray 2 sprays, Each Nare, Daily PRN  . icosapent Ethyl (VASCEPA) 2 g, Oral, 2 times daily  . Immune Globulin 10% (IMMUNE GLOBULIN 10%) 10G/143mL (10,000mg /128mL) SOLN 1 g/kg, Intravenous, See admin instructions, 75 g every 21 days for 1 consecutive days  . metFORMIN (GLUCOPHAGE) 500-1,000 mg, Oral, 2 times daily with meals, Take 1000mg  in morning with breakfast and 500mg  with dinner  . mycophenolate (CELLCEPT) 750 mg, Oral, 2 times daily  . nitroGLYCERIN (NITROSTAT) 0.4 mg, Sublingual, Every 5 min PRN  . pantoprazole (PROTONIX) 40 mg, Oral, Daily  . PRALUENT 150 MG/ML SOAJ INJECT CONTENTS OF 1 PEN SUBCUTANEOUSLY EVERY TWO WEEKS  . predniSONE (DELTASONE) 30 mg, Oral, Daily  . Syringe/Needle, Disp, (SYRINGE 3CC/22GX1") 22G X 1" 3 ML MISC Use with B12 injections  . tamsulosin (FLOMAX) 0.4 mg, Oral, Daily at bedtime   Cardiac Studies:   Coronary angiogram 04/16/16: Proximal Cx/OM1 bifurcating  (3.0 x 28 mm in the circumflex and 3.0 x 24 mm  Synergy DES). 07/08/2015: PCI to proximal and mid LAD with 3.0x38 and 3.5x15 mm Resolute DES (07/08/15).  Presented with near syncope Focal OM restenosis S/P Balloon PTCA  Echocardiogram 04/092020 :   1. The left ventricle has normal systolic function, with an ejection fraction of 60-65%. The cavity size was normal. Left ventricular diastolic Doppler parameters are consistent with impaired relaxation.  2. The right ventricle has normal systolc function. The cavity was normal. There is no increase in right ventricular wall thickness.  Left Heart Catheterization 05/13/20:  Normal LV systolic function, normal LVEDP. Very mild in-stent restenosis in the distal segment of the LAD stent.  Otherwise minimal disease in the LAD and circumflex coronary artery.  Left main is widely patent. Right coronary artery has about a 30% stenosis in the midsegment with mild contrast hung up  proximal to the lesion however this is also evident on  prior angiography.  Rec: Evaluation for noncardiac causes for his fatigue and dyspnea as indicated.  Suspect his symptoms are probably related to generalized deconditioning.  EKG:  EKG 01/18/2020: Marked sinus bradycardia rate of 46 bpm, normal axis, no evidence of ischemia.  No significant change from 07/13/2019.   Assessment     ICD-10-CM   1. Coronary artery disease involving native coronary artery of native heart without angina pectoris  I25.10   2. Autoimmune necrotizing myopathy  G72.49   3. 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 Caucasian male with CAD,staged PCI, first on 07/08/2015 with 3.0x38 & 3.0x75mm Resolute DES to prox and mid LAD, then on 07/17/2015 with stenting of the bifurcating large OM1 and proximal circumflex with implantation of 3.0 x 28 mm in the circumflex and 3.0 x 24 mm Synergy DES with [employer]otte Technique. 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  He has had atypical presentation with his coronary artery disease in the past, recently has been having worsening dyspnea, marked fatigue and hence with high suspicion that he may have progression of coronary disease underwent cardiac catheterization on 05/13/2020 fortunately revealing widely patent stents.  Patient is accompanied by his wife today and states that his energy level is gradually started to improve.  He is now developing visual disturbances and is being evaluated at [facility] for optic neuritis.  He is now on appropriate medical therapy from cardiac standpoint.  No changes were done by me today and I have reviewed the angiogram with the patient and his wife.  I will see him back on annual basis.   Adrian Prows, MD, [facility] 06/08/2020, 3:20 PM Office: [phone]

## 2020-06-08 ENCOUNTER — Other Ambulatory Visit: Payer: Self-pay | Admitting: Cardiology

## 2020-06-08 ENCOUNTER — Encounter: Payer: Self-pay | Admitting: Cardiology

## 2020-06-08 DIAGNOSIS — I2510 Atherosclerotic heart disease of native coronary artery without angina pectoris: Secondary | ICD-10-CM

## 2020-06-09 ENCOUNTER — Encounter (HOSPITAL_COMMUNITY): Payer: Medicare Other

## 2020-06-09 ENCOUNTER — Other Ambulatory Visit: Payer: Self-pay

## 2020-06-09 DIAGNOSIS — G7249 Other inflammatory and immune myopathies, not elsewhere classified: Secondary | ICD-10-CM

## 2020-06-10 ENCOUNTER — Encounter (HOSPITAL_COMMUNITY): Payer: Medicare Other

## 2020-06-10 ENCOUNTER — Other Ambulatory Visit: Payer: Self-pay | Admitting: Neurology

## 2020-06-11 LAB — CK: Total CK: 97 U/L (ref 41–331)

## 2020-06-12 ENCOUNTER — Encounter (HOSPITAL_COMMUNITY): Payer: Medicare Other

## 2020-06-19 ENCOUNTER — Encounter: Payer: Self-pay | Admitting: Neurology

## 2020-06-19 ENCOUNTER — Ambulatory Visit (INDEPENDENT_AMBULATORY_CARE_PROVIDER_SITE_OTHER): Payer: Medicare Other | Admitting: Neurology

## 2020-06-19 ENCOUNTER — Other Ambulatory Visit: Payer: Self-pay

## 2020-06-19 VITALS — BP 132/83 | HR 68 | Ht 69.0 in | Wt 197.0 lb

## 2020-06-19 DIAGNOSIS — E538 Deficiency of other specified B group vitamins: Secondary | ICD-10-CM

## 2020-06-19 DIAGNOSIS — H469 Unspecified optic neuritis: Secondary | ICD-10-CM | POA: Diagnosis not present

## 2020-06-19 DIAGNOSIS — G7249 Other inflammatory and immune myopathies, not elsewhere classified: Secondary | ICD-10-CM

## 2020-06-19 NOTE — Patient Instructions (Addendum)
Reduce frequency of IVIG to every 28 days x 1, then every 6 weeks x 1, then 8 weeks. Continue Cellcept 750mg  twice daily Check CK monthly Check CBC and CMP in November  Start over the counter vitamin B12 1033mcg daily  Return to clinic first week of December

## 2020-06-19 NOTE — Progress Notes (Signed)
Follow-up Visit   Date: 06/19/20   Stanley Boyd MRN: [id number] DOB: [birth year]   Interim History: Stanley Boyd is a 69 y.o. 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  The patient was accompanied to the clinic by wife who also provides collateral information.    His IVIG was reduced to 1mg /kg over 1 day and he reports feeling much better with this dose adjustments.  Most recent CK has been normal and he denies any new weakness. He has ongoing papilledema and optic neuropathy in the eyes and has transitioned his care to neurophthalmology to [facility]. So far his evaluation has been unremarkable and there is concern whether his optic neuritis may be been vaccine related. He is on prednisone 20mg  and also received localized injection to the eye.  Unfortunately, he has not appreciated any improvement in his vision.     Medications:  Current Outpatient Medications on File Prior to Visit  Medication Sig Dispense Refill  . aspirin 81 MG chewable tablet Chew 1 tablet (81 mg total) by mouth at bedtime.    . brimonidine (ALPHAGAN) 0.2 % ophthalmic solution INSTILL 1 DROP INTO RIGHT EYE THREE TIMES DAILY    . Carboxymethylcellulose Sodium (REFRESH TEARS OP) Place 1 drop into both eyes daily as needed (dry eyes).     . cyanocobalamin (,VITAMIN B-12,) 1000 MCG/ML injection Inject IM daily for 7 days, then weekly for 4 weeks, then monthly (Patient taking differently: Inject 1,000 mcg into the muscle every 30 (thirty) days. ) 6 mL 0  . ezetimibe (ZETIA) 10 MG tablet TAKE 1 TABLET BY MOUTH ONCE DAILY AFTER SUPPER 30 tablet 6  . finasteride (PROSCAR) 5 MG tablet Take 5 mg by mouth at bedtime.     . fluticasone (FLONASE) 50 MCG/ACT nasal spray Place 2 sprays into  both nostrils daily as needed for allergies or rhinitis.    Marland Kitchen icosapent Ethyl (VASCEPA) 1 g capsule Take 2 capsules (2 g total) by mouth 2 (two) times daily. 120 capsule 6  . Immune Globulin 10% (IMMUNE GLOBULIN 10%) 10G/137mL (10,000mg /155mL) SOLN Inject 1 g/kg into the vein See admin instructions. 75 g every 21 days for 1 consecutive days    . metFORMIN (GLUCOPHAGE) 500 MG tablet Take 1-2 tablets (500-1,000 mg total) by mouth 2 (two) times daily with a meal. Take 1000mg  in morning with breakfast and 500mg  with dinner (Patient taking differently: Take 500 mg by mouth 2 (two) times daily with a meal. ) 270 tablet 3  . mycophenolate (CELLCEPT) 500 MG tablet Take 1.5 tablets (750 mg total) by mouth 2 (two) times daily. 270 tablet 3  . nitroGLYCERIN (NITROSTAT) 0.4 MG SL tablet Place 1 tablet (0.4 mg total) under the tongue every 5 (five) minutes as needed for chest pain. 25 tablet 4  . pantoprazole (PROTONIX) 40 MG tablet Take 40 mg by mouth daily.    Marland Kitchen PRALUENT 150 MG/ML SOAJ INJECT THE CONTENTS OF 1 PEN SUBCUTANEOUSLY EVERY 2 WEEKS. 2 mL 3  . predniSONE (DELTASONE) 20 MG tablet Take 20 mg by mouth daily. Take One tablet once daily    . Syringe/Needle, Disp, (SYRINGE 3CC/22GX1") 22G X 1" 3 ML MISC Use with B12 injections 10 each 0  . tamsulosin (FLOMAX) 0.4 MG CAPS capsule Take 0.4 mg by mouth at bedtime.  No current facility-administered medications on file prior to visit.    Allergies:  Allergies  Allergen Reactions  . Statins Other (See Comments)    Myositis Myositis    Vital Signs:  BP 132/83   Pulse 68   Ht 5\' 9"  (1.753 m)   Wt 197 lb (89.4 kg)   SpO2 98%   BMI 29.09 kg/m   Neurological Exam: MENTAL STATUS including orientation to time, place, person, recent and remote memory, attention span and concentration, language, and fund of knowledge is normal.  Speech is not dysarthric.  CRANIAL NERVES: Normal conjugate, extra-ocular eye movements in all directions of gaze.  No  ptosis. Face is symmetric.   MOTOR:  Motor strength is 5/5 proximally, R hand is 74/5 (old from stroke).No atrophy, fasciculations or abnormal movements.  No pronator drift.   COORDINATION/GAIT:   Gait narrow based and stable.   Data: MRI cervical spine without contrast 02/17/2019: 1. 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 2. Solid anterior fusion at C4-5 and C5-6 without spinal stenosis at these levels. 3. Adjacent segment degenerative changes with moderate spinal stenosis at C6-7, mild-to-moderate spinal stenosis at C3-4, and mild-to-moderate neural foraminal stenosis at both levels.  MRI lumbar spine 02/17/2019: 1. Relatively diffuse lumbar disc and facet degeneration without significant spinal stenosis. 2. Multilevel neural foraminal stenosis, moderate on the left at L5-S1. 3. Normal appearance of the conus medullaris and cauda equina.  MRI thoracic spine without contrast 02/17/2019: 1. Mild thoracic disc and facet degeneration without evidence of neural impingement. 2. Minimal prominence of the central canal of the mid to lower thoracic spinal cord without syrinx.   Left rectus femorus muscle biopsy 02/20/2019:  Active necrotizing inflammatory myopathy. Right chest wall skin biopsy:  Benign skin with fibrosis and mixed superficial and deep inflammation  Neuromuscular Lab - [facility] 03/20/2019:  Myopathy 2 panel - HMGCR 36,000 (normal <2500)  Lab Results  Component Value Date   CKTOTAL 97 06/10/2020   CKMB CANCELED 03/21/2020   TROPONINI 0.03 (HH) 02/17/2019    Lab Results  Component Value Date   HGBA1C 7.4 (H) 04/18/2020   Lab Results  Component Value Date   CKTOTAL 97 06/10/2020   CKMB CANCELED 03/21/2020   TROPONINI 0.03 (HH) 02/17/2019   Component CK Total  Latest Ref Rng & Units 41.0 - 331.0 U/L  01/25/2020 352 (H)  04/18/2020 361 (H)  05/06/2020 331  06/10/2020 97    IMPRESSION/PLAN: 1.  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  Clinically stable.   Exam without motor deficits and CK is 361 > 331 > 97.   - Reduce frequency of IVIG to every 28 days x 1, then every 6 weeks x 1, then 8 weeks.  - Continue Cellcept 750mg  twice daily  - Check CK monthly  - Check CBC and CMP in November  2.  Optic neuropathy OU, followed at [facility] neurophthalmology   3.  Vitamin B12 deficiency, transition to oral 1077mcg daily.     Return to clinic in 2 months  Thank you for allowing me to participate in patient's care.  If I can answer any additional questions, I would be pleased to do so.    Sincerely,    Kelly Ranieri K. Posey Pronto, DO

## 2020-06-20 ENCOUNTER — Other Ambulatory Visit: Payer: Self-pay

## 2020-06-20 ENCOUNTER — Other Ambulatory Visit: Payer: Self-pay | Admitting: Neurology

## 2020-06-23 ENCOUNTER — Other Ambulatory Visit: Payer: Self-pay

## 2020-06-23 ENCOUNTER — Ambulatory Visit (HOSPITAL_COMMUNITY)
Admission: RE | Admit: 2020-06-23 | Discharge: 2020-06-23 | Disposition: A | Discharge status: Home / Self Care | Payer: Medicare Other | Source: Ambulatory Visit | Attending: Neurology | Admitting: Neurology

## 2020-06-23 DIAGNOSIS — G7249 Other inflammatory and immune myopathies, not elsewhere classified: Secondary | ICD-10-CM | POA: Diagnosis not present

## 2020-06-23 MED ORDER — IMMUNE GLOBULIN (HUMAN) 10 GM/100ML IV SOLN
1.0000 g/kg | INTRAVENOUS | Status: DC
  Administered 2020-06-23: 90 g via INTRAVENOUS
  Filled 2020-06-23: qty 800

## 2020-06-25 ENCOUNTER — Other Ambulatory Visit: Payer: Self-pay | Admitting: Neurology

## 2020-06-30 DIAGNOSIS — H47292 Other optic atrophy, left eye: Secondary | ICD-10-CM | POA: Insufficient documentation

## 2020-07-14 ENCOUNTER — Ambulatory Visit (HOSPITAL_COMMUNITY): Payer: Medicare Other

## 2020-07-16 ENCOUNTER — Other Ambulatory Visit: Payer: Self-pay

## 2020-07-16 DIAGNOSIS — I2510 Atherosclerotic heart disease of native coronary artery without angina pectoris: Secondary | ICD-10-CM

## 2020-07-16 DIAGNOSIS — E7800 Pure hypercholesterolemia, unspecified: Secondary | ICD-10-CM

## 2020-07-16 MED ORDER — ICOSAPENT ETHYL 1 G PO CAPS: 2.0000 g | ORAL_CAPSULE | Freq: Two times a day (BID) | ORAL | 6 refills | Status: DC

## 2020-07-21 ENCOUNTER — Ambulatory Visit (HOSPITAL_COMMUNITY)
Admission: RE | Admit: 2020-07-21 | Discharge: 2020-07-21 | Disposition: A | Discharge status: Home / Self Care | Payer: Medicare Other | Source: Ambulatory Visit | Attending: Neurology | Admitting: Neurology

## 2020-07-21 ENCOUNTER — Other Ambulatory Visit: Payer: Self-pay

## 2020-07-21 DIAGNOSIS — G7249 Other inflammatory and immune myopathies, not elsewhere classified: Secondary | ICD-10-CM | POA: Insufficient documentation

## 2020-07-21 DIAGNOSIS — G7281 Critical illness myopathy: Secondary | ICD-10-CM | POA: Insufficient documentation

## 2020-07-21 MED ORDER — IMMUNE GLOBULIN (HUMAN) 10 GM/100ML IV SOLN
1.0000 g/kg | INTRAVENOUS | Status: DC
  Administered 2020-07-21: 90 g via INTRAVENOUS
  Filled 2020-07-21: qty 800

## 2020-07-23 ENCOUNTER — Other Ambulatory Visit: Payer: Self-pay

## 2020-07-25 ENCOUNTER — Encounter: Payer: Self-pay | Admitting: Neurology

## 2020-07-25 ENCOUNTER — Ambulatory Visit: Payer: Medicare Other | Admitting: Cardiology

## 2020-07-25 LAB — CK: Total CK: 318 U/L (ref 41–331)

## 2020-07-25 NOTE — Progress Notes (Addendum)
Contacted by April Keene to get PA for [employer] and have it back dated for 04/10/20. Submitted forms and clinicals to insurance. Contact info: Tonya @ [employer] P#: [phone].   She said that it was approved valid for one year 04/10/20 to 04/10/21. Received fax with wrong dates (07/21/20 to 07/21/21). LMOM with her on 07/20/20 and 07/20/20 to send [place] over corrected PA letter with July dates. She stated when I spoke with her that she was correcting these but I have been unable to reach her to get refaxed letter.   10/11- spoke with her and she was supposed to update and fax new letter 10/13- lmom letting her know we never received fax 10/15-lmom for same thing

## 2020-08-01 ENCOUNTER — Telehealth: Payer: Self-pay

## 2020-08-01 ENCOUNTER — Telehealth: Payer: Self-pay | Admitting: Neurology

## 2020-08-01 NOTE — Telephone Encounter (Signed)
Called patients wife and left a message for a call back.  

## 2020-08-01 NOTE — Telephone Encounter (Signed)
Patients wife PAM has called twice this morning regarding lab results requesting to talk to dr.hunters nurse.

## 2020-08-01 NOTE — Telephone Encounter (Signed)
Patient's wife called in and left a message. She wanted to speak with someone to help her understand the information "that was just put into the chart".

## 2020-08-04 NOTE — Telephone Encounter (Signed)
Called and lm for pt wife tcb.  

## 2020-08-04 NOTE — Telephone Encounter (Signed)
Patients wife called and had questions in regards to patients IVIG. She saw that patients infusion was not approved and needed to know if he was getting his infusion in November? If not, what is the plan next? Informed patient that I would check into this and give a call back

## 2020-08-04 NOTE — Telephone Encounter (Signed)
Was informed by [facility] that Patients IVIG has been approved from 04/10/20 to 04/10/21 . Patients wife has requested to be notified by Mychart message.

## 2020-08-07 ENCOUNTER — Other Ambulatory Visit: Payer: Self-pay | Admitting: Family Medicine

## 2020-08-07 ENCOUNTER — Ambulatory Visit (INDEPENDENT_AMBULATORY_CARE_PROVIDER_SITE_OTHER): Payer: Medicare Other

## 2020-08-07 ENCOUNTER — Other Ambulatory Visit: Payer: Self-pay

## 2020-08-07 ENCOUNTER — Telehealth: Payer: Self-pay | Admitting: Family Medicine

## 2020-08-07 ENCOUNTER — Encounter: Payer: Self-pay | Admitting: Family Medicine

## 2020-08-07 DIAGNOSIS — E538 Deficiency of other specified B group vitamins: Secondary | ICD-10-CM

## 2020-08-07 DIAGNOSIS — T380X5A Adverse effect of glucocorticoids and synthetic analogues, initial encounter: Secondary | ICD-10-CM

## 2020-08-07 DIAGNOSIS — R739 Hyperglycemia, unspecified: Secondary | ICD-10-CM

## 2020-08-07 DIAGNOSIS — E785 Hyperlipidemia, unspecified: Secondary | ICD-10-CM

## 2020-08-07 DIAGNOSIS — Z23 Encounter for immunization: Secondary | ICD-10-CM | POA: Diagnosis not present

## 2020-08-07 NOTE — Telephone Encounter (Signed)
Wife in for visit- requests labs for him prior to visit- ordered today- will schedule at desk

## 2020-08-08 LAB — CBC WITH DIFFERENTIAL/PLATELET
Absolute Monocytes: 484 cells/uL (ref 200–950)
Basophils Absolute: 52 cells/uL (ref 0–200)
Basophils Relative: 1 %
Eosinophils Absolute: 62 cells/uL (ref 15–500)
Eosinophils Relative: 1.2 %
HCT: 41.1 % (ref 38.5–50.0)
Hemoglobin: 13.7 g/dL (ref 13.2–17.1)
Lymphs Abs: 1966 cells/uL (ref 850–3900)
MCH: 30.6 pg (ref 27.0–33.0)
MCHC: 33.3 g/dL (ref 32.0–36.0)
MCV: 91.7 fL (ref 80.0–100.0)
MPV: 11.3 fL (ref 7.5–12.5)
Monocytes Relative: 9.3 %
Neutro Abs: 2636 cells/uL (ref 1500–7800)
Neutrophils Relative %: 50.7 %
Platelets: 242 10*3/uL (ref 140–400)
RBC: 4.48 10*6/uL (ref 4.20–5.80)
RDW: 13.1 % (ref 11.0–15.0)
Total Lymphocyte: 37.8 %
WBC: 5.2 10*3/uL (ref 3.8–10.8)

## 2020-08-08 LAB — COMPLETE METABOLIC PANEL WITH GFR
AG Ratio: 1.3 (calc) (ref 1.0–2.5)
ALT: 63 U/L — ABNORMAL HIGH (ref 9–46)
AST: 53 U/L — ABNORMAL HIGH (ref 10–35)
Albumin: 4.3 g/dL (ref 3.6–5.1)
Alkaline phosphatase (APISO): 53 U/L (ref 35–144)
BUN: 17 mg/dL (ref 7–25)
CO2: 27 mmol/L (ref 20–32)
Calcium: 9.9 mg/dL (ref 8.6–10.3)
Chloride: 101 mmol/L (ref 98–110)
Creat: 0.94 mg/dL (ref 0.70–1.25)
GFR, Est African American: 97 mL/min/{1.73_m2} (ref >=60)
GFR, Est Non African American: 84 mL/min/{1.73_m2} (ref >=60)
Globulin: 3.3 g/dL (calc) (ref 1.9–3.7)
Glucose, Bld: 96 mg/dL (ref 65–99)
Potassium: 4.3 mmol/L (ref 3.5–5.3)
Sodium: 138 mmol/L (ref 135–146)
Total Bilirubin: 0.4 mg/dL (ref 0.2–1.2)
Total Protein: 7.6 g/dL (ref 6.1–8.1)

## 2020-08-08 LAB — HEMOGLOBIN A1C
Hgb A1c MFr Bld: 6.7 % of total Hgb — ABNORMAL HIGH (ref <5.7)
Mean Plasma Glucose: 146 (calc)
eAG (mmol/L): 8.1 (calc)

## 2020-08-08 LAB — MICROALBUMIN / CREATININE URINE RATIO
Creatinine, Urine: 172 mg/dL (ref 20–320)
Microalb Creat Ratio: 33 mcg/mg creat — ABNORMAL HIGH (ref <30)
Microalb, Ur: 5.6 mg/dL

## 2020-08-08 LAB — VITAMIN B12: Vitamin B-12: 610 pg/mL (ref 200–1100)

## 2020-08-11 ENCOUNTER — Ambulatory Visit (HOSPITAL_COMMUNITY): Payer: Medicare Other

## 2020-08-14 ENCOUNTER — Ambulatory Visit (INDEPENDENT_AMBULATORY_CARE_PROVIDER_SITE_OTHER): Payer: Medicare Other

## 2020-08-14 ENCOUNTER — Other Ambulatory Visit: Payer: Self-pay

## 2020-08-14 ENCOUNTER — Encounter: Payer: Self-pay | Admitting: Family Medicine

## 2020-08-14 ENCOUNTER — Ambulatory Visit: Payer: Self-pay

## 2020-08-14 ENCOUNTER — Ambulatory Visit (INDEPENDENT_AMBULATORY_CARE_PROVIDER_SITE_OTHER): Payer: Medicare Other | Admitting: Family Medicine

## 2020-08-14 VITALS — BP 122/78 | HR 52 | Ht 69.0 in | Wt 197.0 lb

## 2020-08-14 DIAGNOSIS — M11261 Other chondrocalcinosis, right knee: Secondary | ICD-10-CM

## 2020-08-14 DIAGNOSIS — M25561 Pain in right knee: Secondary | ICD-10-CM

## 2020-08-14 NOTE — Progress Notes (Signed)
Subjective:    CC: R knee swelling  I, Molly Weber, LAT, ATC, am serving as scribe for Dr. [name].  HPI: Pt is a 69 y/o male presenting w/ R knee swelling.  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 Pt reports pn located anterior and medial side of the R knee. MOI: Pt may have stepped on a hole when he went hunting about a week ago. Monday noted he really started to not being able to bend the leg esp after going walking in the woods. Aggravating factors: walking, esp on uneven surfaces. Treatments tried: compression wrap, icy hot, tylenol.  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  Additionally history right-sided weakness secondary to neck surgery. History high-dose steroids as part of his treatment for his myopathy.  Has weaned off of steroids completely now and is currently on IVIG.   Objective:    Vitals:   08/14/20 1057  BP: 122/78  Pulse: (!) 52  SpO2: 96%   General: Well Developed, well nourished, and in no acute distress.   MSK: Right knee moderate effusion otherwise normal-appearing Range of motion 5-100 degrees mild crepitation. Tender palpation mildly medial joint line. Stable ligamentous exam. Positive medial McMurray's test. Slight decrease strength in knee extension.   Lab and Radiology Results  X-ray images right knee obtained today personally and independently interpreted Medial compartment DJD moderate.  Chondrocalcinosis pattern present.  No acute fractures. Await formal radiology review  Procedure: Real-time Ultrasound Guided Injection of right knee superior lateral patellar space Device: Philips Affiniti 50G Images permanently stored and available for review in PACS Ultrasound examination prior to injection reveals moderate effusion.  Degenerative appearing lateral meniscus.  Significantly  degenerative appearing medial meniscus with visible tear mid substance.  No Baker's cyst.  Verbal informed consent obtained.  Discussed risks and benefits of procedure. Warned about infection bleeding damage to structures skin hypopigmentation and fat atrophy among others. Patient expresses understanding and agreement Time-out conducted.   Noted no overlying erythema, induration, or other signs of local infection.   Skin prepped in a sterile fashion.   Local anesthesia: Topical Ethyl chloride.   With sterile technique and under real time ultrasound guidance:  40 mg of Kenalog and 2 mL of Marcaine injected into knee. Fluid seen entering the joint capsule.   Completed without difficulty   Pain immediately resolved suggesting accurate placement of the medication.   Advised to call if fevers/chills, erythema, induration, drainage, or persistent bleeding.   Images permanently stored and available for review in the ultrasound unit.  Impression: Technically successful ultrasound guided injection.     Impression and Recommendations:    Assessment and Plan: 69 y.o. male with right knee pain and effusion after increasing activity level.  He does not have any specific the defined incident to suspect acute injury.  On physical and ultrasound examination he has degenerative changes and what appears to be a medial meniscus tear.  That however could be old.  X-ray shows chondrocalcinosis so pseudogout is certainly a possibility as well.  Would consider colchicine in the future if steroid injection does not provide lasting relief.  Plan for steroid injection Voltaren gel and advancing activity as tolerated.. Recheck back as needed.  PDMP not reviewed this encounter. Orders Placed This Encounter  Procedures  . [place] LIMITED JOINT SPACE STRUCTURES LOW RIGHT(NO LINKED CHARGES)    Standing Status:  Future    Number of Occurrences:   1    Standing Expiration Date:   02/11/2021    Order Specific Question:    Reason for Exam (SYMPTOM  OR DIAGNOSIS REQUIRED)    Answer:   right knee pain    Order Specific Question:   Preferred imaging location?    Answer:   [place]  . DG Knee AP/LAT W/[place] Right    Standing Status:   Future    Standing Expiration Date:   08/14/2021    Order Specific Question:   Reason for Exam (SYMPTOM  OR DIAGNOSIS REQUIRED)    Answer:   eval knee pain    Order Specific Question:   Preferred imaging location?    Answer:   [facility]   No orders of the defined types were placed in this encounter.   Discussed warning signs or symptoms. Please see discharge instructions. Patient expresses understanding.   The above documentation has been reviewed and is accurate and complete [name], M.D.

## 2020-08-14 NOTE — Patient Instructions (Signed)
Get XR on the way out today. Use voltaren gel as needed Recheck as needed

## 2020-08-18 ENCOUNTER — Ambulatory Visit (HOSPITAL_COMMUNITY): Payer: Medicare Other

## 2020-08-18 NOTE — Progress Notes (Signed)
Knee x-ray shows chondrocalcinosis.  This can sometimes be caused by pseudogout.  This is treated similarly to gout.  The cortisone shot that you had should help.  If it does not help enough would add colchicine.

## 2020-08-19 ENCOUNTER — Telehealth: Payer: Self-pay | Admitting: Family Medicine

## 2020-08-19 MED ORDER — COLCHICINE 0.6 MG PO TABS: 0.6000 mg | ORAL_TABLET | Freq: Every day | ORAL | 2 refills | Status: DC | PRN

## 2020-08-19 NOTE — Telephone Encounter (Signed)
Pt wife called, recd our message with knee imaging results.  Would like to start the colchicine, called into [facility] in [id number] to be sure we reviewed his meds and that this will not affect anything ( takes immunoglobulin infusions )  Should he be resting? He is hunting and doing yard work, he initially felt better after the injection but is starting to feel worse.

## 2020-08-19 NOTE — Telephone Encounter (Signed)
Called pt's wife and relayed Dr. Clovis Riley advice.  She verbalizes understanding and has no additional questions/concerns.

## 2020-08-19 NOTE — Telephone Encounter (Signed)
Colchicine sent.  Advised patient about good Rx if insurance gets fussy with colchicine.  This medicine should be safe with the current medications that you are taking.  As for restrictions if your knee is hurting and painful reduce your activity level.  This may result in less yard work or heavy duty activity.  Ideally you should be able to do things like mow the lawn without hurting too much but for now give it a rest.

## 2020-08-21 NOTE — Progress Notes (Signed)
Phone [phone] In person visit   Subjective:   Stanley Boyd is a 69 y.o. year old very pleasant male patient who presents for/with See problem oriented charting Chief Complaint  Patient presents with  . Hyperglycemia   This visit occurred during the SARS-CoV-2 public health emergency.  Safety protocols were in place, including screening questions prior to the visit, additional usage of staff PPE, and extensive cleaning of exam room while observing appropriate contact time as indicated for disinfecting solutions.   Past Medical History-  Patient Active Problem List   Diagnosis Date Noted  . Optic neuropathy 06/19/2020    Priority: High  . Steroid-induced hyperglycemia 03/23/2019    Priority: High  . 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 CAD (coronary artery disease), native coronary artery 07/13/2015    Priority: High  . S/P PTCA (percutaneous transluminal coronary angioplasty) 07/08/2015    Priority: High  . Elevated PSA 02/25/2017    Priority: Medium  . Hemiparesis ([place]) 09/18/2014    Priority: Medium  . BPH (benign prostatic hyperplasia) 09/18/2014    Priority: Medium  . Hyperlipidemia     Priority: Medium  . Bradycardia     Priority: Medium  . Polyp of sigmoid colon     Priority: Low  . B12 deficiency 02/26/2019    Priority: Low  . History of colonic polyps 10/15/2014    Priority: Low  . Transaminitis 09/18/2014    Priority: Low  . Proximal muscle weakness 02/26/2019    Medications- reviewed and updated Current Outpatient Medications  Medication Sig Dispense Refill  . aspirin 81 MG chewable tablet Chew 1 tablet (81 mg total) by mouth at bedtime.    . brimonidine (ALPHAGAN) 0.2 % ophthalmic solution INSTILL 1 DROP INTO RIGHT EYE THREE TIMES DAILY    . Carboxymethylcellulose Sodium (REFRESH TEARS OP) Place 1 drop into both eyes daily as needed (dry eyes).     . colchicine 0.6 MG tablet Take 1 tablet (0.6 mg total) by mouth daily  as needed (psuedogout pain). 30 tablet 2  . ezetimibe (ZETIA) 10 MG tablet TAKE 1 TABLET BY MOUTH ONCE DAILY AFTER SUPPER 30 tablet 6  . finasteride (PROSCAR) 5 MG tablet Take 5 mg by mouth at bedtime.     . fluticasone (FLONASE) 50 MCG/ACT nasal spray Place 2 sprays into both nostrils daily as needed for allergies or rhinitis.    Marland Kitchen icosapent Ethyl (VASCEPA) 1 g capsule Take 2 capsules (2 g total) by mouth 2 (two) times daily. 120 capsule 6  . Immune Globulin 10% (IMMUNE GLOBULIN 10%) 10G/138m (10,0075m100mL) SOLN Inject 1 g/kg into the vein See admin instructions. 75 g every 21 days for 1 consecutive days    . metFORMIN (GLUCOPHAGE) 500 MG tablet Take 1-2 tablets (500-1,000 mg total) by mouth 2 (two) times daily with a meal. Take 100071mn morning with breakfast and 500m42mth dinner (Patient taking differently: Take 500 mg by mouth 2 (two) times daily with a meal. ) 270 tablet 3  . mycophenolate (CELLCEPT) 500 MG tablet Take 1.5 tablets (750 mg total) by mouth 2 (two) times daily. 270 tablet 3  . nitroGLYCERIN (NITROSTAT) 0.4 MG SL tablet Place 1 tablet (0.4 mg total) under the tongue every 5 (five) minutes as needed for chest pain. 25 tablet 4  . pantoprazole (PROTONIX) 40 MG tablet Take 1 tablet by mouth once daily 90 tablet 0  . PRALUENT 150 MG/ML SOAJ INJECT THE CONTENTS OF 1 PEN SUBCUTANEOUSLY  EVERY 2 WEEKS. 2 mL 3  . Syringe/Needle, Disp, (SYRINGE 3CC/22GX1") 22G X 1" 3 ML MISC Use with B12 injections 10 each 0  . tamsulosin (FLOMAX) 0.4 MG CAPS capsule Take 0.4 mg by mouth at bedtime.    . vitamin B-12 (CYANOCOBALAMIN) 1000 MCG tablet Take 1,000 mcg by mouth daily.    . calcium-vitamin D (OSCAL WITH D) 500-200 MG-UNIT TABS tablet TAKE 1 TABLET BY MOUTH THREE TIMES DAILY (Patient not taking: Reported on 08/22/2020) 270 tablet 0   No current facility-administered medications for this visit.     Objective:  BP 118/76   Pulse (!) 54   Temp 98.1 F (36.7 C) (Temporal)   Resp 18   Ht 5'  9" (1.753 m)   Wt 192 lb (87.1 kg)   SpO2 97%   BMI 28.35 kg/m  Gen: NAD, resting comfortably CV: RRR no murmurs rubs or gallops Lungs: CTAB no crackles, wheeze, rhonchi Ext: no edema Skin: warm, dry     Assessment and Plan   #Steroid-induced hyperglycemia S:  Medication: Down to Metformin 500 mg 1 in AM and 1 at night. Now off prednisone so likely to trend down Exercise and diet- 120s in AM mostly.  Lab Results  Component Value Date   HGBA1C 6.7 (H) 08/07/2020   HGBA1C 7.4 (H) 04/18/2020   HGBA1C 7.4 (H) 01/09/2020   A/P: I am hopeful sugars will continue to trend down off of steroids-she recently had a knee injection which may raise sugar slightly.  We discussed checking blood sugars and if continues to trend down can go to once a day or he can come off medication prior to next visit in 4 months-would prefer fasting sugars below 120 before stepping down medication  #CAD  #hyperlipidemia S: Medication: Compliant with aspirin 81 mg.  On Vascepa and Praluent and Zetia  Walking 2 miles at gym 2 days a week.  No chest pain or shortness of breath or abnormal fatigue which was original presenting symptom for coronary artery disease Lab Results  Component Value Date   CHOL 117 04/18/2020   HDL 40 04/18/2020   LDLCALC 51 04/18/2020   LDLDIRECT 58 02/26/2020   TRIG 155 (H) 04/18/2020   CHOLHDL 6 06/28/2019   A/P: CAD appears asymptomatic-continue current medication  Hyperlipidemia-has been well controlled on Vascepa, Praluent, diarrhea-continue current medication   #Autoimmune necrotizing myopathy-patient on IVIG nowevery 6 weeks.  He is off prednisone.  He also remains on CellCept.  Close follow-up with Dr. Posey Pronto planned-just had CK level drawn today.  Asked me to recheck withdrawal blood work before next visit   #Elevated LFTs-continued mild elevations noted.  Likely continue to monitor as long as under 100 Hepatic Function Latest Ref Rng & Units 08/07/2020 05/06/2020 04/18/2020   Total Protein 6.1 - 8.1 g/dL 7.6 9.4(H) 8.7(H)  Albumin 3.8 - 4.8 g/dL - 3.9 4.1  AST 10 - 35 U/L 53(H) 46(H) 53(H)  ALT 9 - 46 U/L 63(H) 59(H) 72(H)  Alk Phosphatase 48 - 121 IU/L - 59 66  Total Bilirubin 0.2 - 1.2 mg/dL 0.4 0.4 0.4  Bilirubin, Direct 0.0 - 0.2 mg/dL - - -    #Optic neuropathy bilateral eyes  -Patient was originally cared for by [facility]-required high-dose steroids -Patient later transition to [facility] ophthalmology -question related to [place] vaccination to which he had a significant reaction with severe symptoms  -Plan is to avoid Moderna booster-no other clear trigger was found - injection in the eye- produced right  ptosis - going to discuss with optho -Interestingly enough-patient was able to kill 2 dear (enjoys hunting) despite strength limitations after autoimmune necrotizing myopathy and vision changes related optic neuropathy  # Right knee pain much improved down to 2-3/10 from 10/10 after injection and then starting colchicine.   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 Posey Pronto to see if she is okay with me reducing this  #B12 deficiency-PPI and Metformin likely contributing-doing okay with oral therapy-may even be able to stop this if we can get him off PPI and Metformin in the long run  #Reviewed after visit-did have prior TSH elevation-likely need to order this with next set of blood work  Recommended follow up: Return in about 4 months (around 12/20/2020). Future Appointments  Date Time Provider [place]  09/01/2020  8:00 AM [id number] None  09/18/2020 10:30 AM Narda Amber K, DO [facility] None  01/08/2021 11:20 AM Marin Olp, MD [facility]  06/12/2021  1:00 PM Adrian Prows, MD [facility] None    Lab/Order associations:   ICD-10-CM   1. Steroid-induced hyperglycemia  R73.9 Microalbumin / creatinine urine ratio   T38.0X5A CBC With Differential/Platelet    COMPLETE METABOLIC PANEL WITH GFR     Hemoglobin A1c  2. Transaminitis  S23.95 COMPLETE METABOLIC PANEL WITH GFR  3. Elevated CK  R74.8 CK (Creatine Kinase)  4. Optic neuropathy  H46.9   5. 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 Hyperlipidemia, unspecified hyperlipidemia type  E78.5   7. Coronary artery disease involving native coronary artery of native heart with other form of angina pectoris ([place])  I25.118    Return precautions advised.  Garret Reddish, MD

## 2020-08-22 ENCOUNTER — Other Ambulatory Visit: Payer: Self-pay

## 2020-08-22 ENCOUNTER — Encounter: Payer: Self-pay | Admitting: Family Medicine

## 2020-08-22 ENCOUNTER — Ambulatory Visit (INDEPENDENT_AMBULATORY_CARE_PROVIDER_SITE_OTHER): Payer: Medicare Other | Admitting: Family Medicine

## 2020-08-22 VITALS — BP 118/76 | HR 54 | Temp 98.1°F | Resp 18 | Ht 69.0 in | Wt 192.0 lb

## 2020-08-22 DIAGNOSIS — R7401 Elevation of levels of liver transaminase levels: Secondary | ICD-10-CM

## 2020-08-22 DIAGNOSIS — Z5181 Encounter for therapeutic drug level monitoring: Secondary | ICD-10-CM

## 2020-08-22 DIAGNOSIS — R748 Abnormal levels of other serum enzymes: Secondary | ICD-10-CM | POA: Diagnosis not present

## 2020-08-22 DIAGNOSIS — R739 Hyperglycemia, unspecified: Secondary | ICD-10-CM

## 2020-08-22 DIAGNOSIS — H469 Unspecified optic neuritis: Secondary | ICD-10-CM

## 2020-08-22 DIAGNOSIS — G7249 Other inflammatory and immune myopathies, not elsewhere classified: Secondary | ICD-10-CM

## 2020-08-22 DIAGNOSIS — I25118 Atherosclerotic heart disease of native coronary artery with other forms of angina pectoris: Secondary | ICD-10-CM

## 2020-08-22 DIAGNOSIS — T380X5A Adverse effect of glucocorticoids and synthetic analogues, initial encounter: Secondary | ICD-10-CM

## 2020-08-22 DIAGNOSIS — E785 Hyperlipidemia, unspecified: Secondary | ICD-10-CM

## 2020-08-22 NOTE — Patient Instructions (Addendum)
Team please print labs for him that I ordered today- he will get these done at [facility] perhaps a week before our next visit  No changes in medications today  Recommended follow up: Return in about 4 months (around 12/20/2020). if we can get off metformin and a1c less than 6.5 can probably go to 6-12 months.

## 2020-08-23 LAB — CBC WITH DIFFERENTIAL/PLATELET
Basophils Absolute: 0.1 10*3/uL (ref 0.0–0.2)
Basos: 1 %
EOS (ABSOLUTE): 0.1 10*3/uL (ref 0.0–0.4)
Eos: 1 %
Hematocrit: 43.4 % (ref 37.5–51.0)
Hemoglobin: 14.4 g/dL (ref 13.0–17.7)
Immature Grans (Abs): 0 10*3/uL (ref 0.0–0.1)
Immature Granulocytes: 0 %
Lymphocytes Absolute: 2.4 10*3/uL (ref 0.7–3.1)
Lymphs: 39 %
MCH: 29.8 pg (ref 26.6–33.0)
MCHC: 33.2 g/dL (ref 31.5–35.7)
MCV: 90 fL (ref 79–97)
Monocytes Absolute: 0.6 10*3/uL (ref 0.1–0.9)
Monocytes: 10 %
Neutrophils Absolute: 3 10*3/uL (ref 1.4–7.0)
Neutrophils: 49 %
Platelets: 268 10*3/uL (ref 150–450)
RBC: 4.84 x10E6/uL (ref 4.14–5.80)
RDW: 13 % (ref 11.6–15.4)
WBC: 6.1 10*3/uL (ref 3.4–10.8)

## 2020-08-23 LAB — COMPREHENSIVE METABOLIC PANEL
ALT: 43 IU/L (ref 0–44)
AST: 33 IU/L (ref 0–40)
Albumin/Globulin Ratio: 1.4 (ref 1.2–2.2)
Albumin: 4.3 g/dL (ref 3.8–4.8)
Alkaline Phosphatase: 69 IU/L (ref 44–121)
BUN/Creatinine Ratio: 20 (ref 10–24)
BUN: 19 mg/dL (ref 8–27)
Bilirubin Total: 0.2 mg/dL (ref 0.0–1.2)
CO2: 27 mmol/L (ref 20–29)
Calcium: 10 mg/dL (ref 8.6–10.2)
Chloride: 100 mmol/L (ref 96–106)
Creatinine, Ser: 0.97 mg/dL (ref 0.76–1.27)
GFR calc Af Amer: 93 mL/min/{1.73_m2} (ref >59)
GFR calc non Af Amer: 80 mL/min/{1.73_m2} (ref >59)
Globulin, Total: 3 g/dL (ref 1.5–4.5)
Glucose: 83 mg/dL (ref 65–99)
Potassium: 4.9 mmol/L (ref 3.5–5.2)
Sodium: 140 mmol/L (ref 134–144)
Total Protein: 7.3 g/dL (ref 6.0–8.5)

## 2020-08-23 LAB — CK: Total CK: 353 U/L — ABNORMAL HIGH (ref 41–331)

## 2020-08-25 ENCOUNTER — Telehealth: Payer: Self-pay

## 2020-08-25 ENCOUNTER — Other Ambulatory Visit: Payer: Self-pay

## 2020-08-25 DIAGNOSIS — G7249 Other inflammatory and immune myopathies, not elsewhere classified: Secondary | ICD-10-CM

## 2020-08-25 DIAGNOSIS — H02401 Unspecified ptosis of right eyelid: Secondary | ICD-10-CM

## 2020-08-25 MED ORDER — PANTOPRAZOLE SODIUM 20 MG PO TBEC: 20.0000 mg | DELAYED_RELEASE_TABLET | Freq: Every day | ORAL | 0 refills | Status: DC

## 2020-08-25 NOTE — Telephone Encounter (Signed)
-----   Message from Marin Olp, MD sent at 08/25/2020 12:07 PM EST ----- Perfect! That's what I thought the reason may have been but since he is off now I thought we could stop the PPI.  Team- can you send in protonix 20 mg daily #30 with no refills. Tell him to take this for 2-3 weeks and if no reflux may stop.  He should let us know if he has reflux as he comes off (give it at least 2-3 days being off of it).   Stanley Boyd   ----- Message ----- From: Alda Berthold, DO Sent: 08/25/2020  11:06 AM EST To: Marin Olp, MD  Veverly Fells,  I had him on it when he was taking higher dose of prednisone.  No indication for it now, unless he remains on steroids from ophthalmology standpoint.  [place] ----- Message ----- From: Marin Olp, MD Sent: 08/22/2020   5:28 PM EST To: Alda Berthold, DO  Hey Dr. Posey Pronto,  Patient has a protonix 40mg  you refilled last visit. He is not having any reflux symptoms- I wanted to try 20 mg but wanted to make sure there might not be a particular reason you had him on this that I might be overlooking. ALso was considering stepping down to pepcid if that works over a months timeframe.  Thanks, Stanley Boyd

## 2020-08-25 NOTE — Telephone Encounter (Signed)
Patients wife sent a Mychart message about patients eye lid dropping. She states that she wanted to [facility] it wasn't anything more concerning with his large muscles.   Spoke to Dr. Posey Pronto in regards to patients eye lid dropping. Dr. Posey Pronto stated that she does not think it is the Myopathy, but Dr. Posey Pronto would like to order a Myastenia Panel on patient, but before we do, she would like more information from Patient's wife.   Does the drooping of the eye lid fluctuate? Is it only one eye? Does it get better or worse as the day goes on? Is there any arm or leg weakness? Difficulty swallowing? Difficulty talking? Double vision? Slurred Speech?  Dr. Posey Pronto looked in CARE EVERYWHERE and found that patients CK is mildly elevated, however, she still doesn't think it has to do with patients Myopathy.

## 2020-08-25 NOTE — Telephone Encounter (Signed)
Called patients wife and informed her of Dr. Serita Grit thoughts and reccomendations per note below (08/25/2020 @ 1:24pm)  Asked patients wife questions requested per Dr. Posey Pronto. Patients wife stated that it is just in right eye and does not flucuate. Some days it looks worse in the morning, however she said it is hard to answer because she can't really tell if it's better or worse througout the day. Patient has No arm or leg weakness. Just knee pain that he received a cortisone shot for. Patient has no difficulty swallowing, no difficulty talking, No double vision, and no slurred speech.   Patient wife would like a lab order sent to [facility] in [place] on [address] and she will be taking him to get labs done either tomorrow or Wednesday.

## 2020-08-25 NOTE — Telephone Encounter (Signed)
Called patients wife in regards to [employer].  Large muscle weakness. Messaged doctor at [facility] but haven't heard back.  Vision still bad. Eye drooping   Wanted dr. Posey Pronto to be aware

## 2020-08-25 NOTE — Telephone Encounter (Signed)
rx sent in and called to make pt aware.

## 2020-08-25 NOTE — Addendum Note (Signed)
Addended by: Clyde Lundborg A on: 08/25/2020 12:32 PM   Modules accepted: Orders

## 2020-08-26 ENCOUNTER — Other Ambulatory Visit: Payer: Self-pay | Admitting: Neurology

## 2020-08-28 ENCOUNTER — Other Ambulatory Visit: Payer: Self-pay

## 2020-09-01 ENCOUNTER — Ambulatory Visit (HOSPITAL_COMMUNITY)
Admission: RE | Admit: 2020-09-01 | Discharge: 2020-09-01 | Disposition: A | Discharge status: Home / Self Care | Payer: Medicare Other | Source: Ambulatory Visit | Attending: Neurology | Admitting: Neurology

## 2020-09-01 ENCOUNTER — Other Ambulatory Visit: Payer: Self-pay

## 2020-09-01 DIAGNOSIS — G7249 Other inflammatory and immune myopathies, not elsewhere classified: Secondary | ICD-10-CM | POA: Insufficient documentation

## 2020-09-01 MED ORDER — IMMUNE GLOBULIN (HUMAN) 5 GM/50ML IV SOLN
85.0000 g | INTRAVENOUS | Status: DC
  Administered 2020-09-01: 85 g via INTRAVENOUS
  Filled 2020-09-01: qty 800

## 2020-09-05 LAB — MYASTHENIA GRAVIS PROFILE
AChR Binding Ab, Serum: <0.03 nmol/L (ref 0.00–0.24)
Acetylchol Block Ab: 24 % (ref 0–25)
Anti-striation Abs: NEGATIVE

## 2020-09-05 LAB — MUSK ANTIBODIES: MuSK Antibodies: <1 U/mL

## 2020-09-15 ENCOUNTER — Other Ambulatory Visit: Payer: Self-pay

## 2020-09-15 ENCOUNTER — Ambulatory Visit: Payer: Medicare Other | Admitting: Neurology

## 2020-09-15 DIAGNOSIS — G7249 Other inflammatory and immune myopathies, not elsewhere classified: Secondary | ICD-10-CM

## 2020-09-15 DIAGNOSIS — H469 Unspecified optic neuritis: Secondary | ICD-10-CM

## 2020-09-16 LAB — CK: Total CK: 592 U/L (ref 41–331)

## 2020-09-18 ENCOUNTER — Ambulatory Visit: Payer: Medicare Other | Admitting: Neurology

## 2020-09-18 ENCOUNTER — Encounter: Payer: Self-pay | Admitting: Neurology

## 2020-09-18 ENCOUNTER — Other Ambulatory Visit: Payer: Self-pay

## 2020-09-18 VITALS — BP 124/67 | HR 58 | Resp 18 | Ht 69.0 in | Wt 194.0 lb

## 2020-09-18 DIAGNOSIS — H469 Unspecified optic neuritis: Secondary | ICD-10-CM | POA: Diagnosis not present

## 2020-09-18 DIAGNOSIS — G7249 Other inflammatory and immune myopathies, not elsewhere classified: Secondary | ICD-10-CM | POA: Diagnosis not present

## 2020-09-18 DIAGNOSIS — H02401 Unspecified ptosis of right eyelid: Secondary | ICD-10-CM | POA: Diagnosis not present

## 2020-09-18 MED ORDER — PREDNISONE 10 MG PO TABS: 20.0000 mg | ORAL_TABLET | Freq: Every day | ORAL | 3 refills | Status: DC

## 2020-09-18 MED ORDER — OYSTER SHELL CALCIUM/D 500-200 MG-UNIT PO TABS
1.0000 | ORAL_TABLET | Freq: Three times a day (TID) | ORAL | 3 refills | Status: DC
Start: 2020-09-18 — End: 2022-11-19

## 2020-09-18 NOTE — Progress Notes (Signed)
Follow-up Visit   Date: 09/18/20   Stanley Boyd MRN: [id number] DOB: 1951/10/08   Interim History: Eliud Polo is a 69 y.o. 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  The patient was accompanied to the clinic by wife who also provides collateral information.    Over the past several months, his right eye has started to droop.  It is worse in the evening and can sometimes close shut.  He is able to open it again, if he tries to keep his eyes open. There is no double vision, difficulty swallowing/talking, or limb weakness.  AChR antibodies were negative. Symptoms started after he has an injection around his eye. He was on prednisone for optic neuropathy and tapered off this about a month ago.  Unfortunately, there has been no improvement in his vision.   His recent CK is elevated to 592, but he denies limb weakness.  He remains active and goes to the gym twice per week.  He has been on a tapering dose of IVIG with the next infusion in 8 weeks.     Medications:  Current Outpatient Medications on File Prior to Visit  Medication Sig Dispense Refill  . aspirin 81 MG chewable tablet Chew 1 tablet (81 mg total) by mouth at bedtime.    . brimonidine (ALPHAGAN) 0.2 % ophthalmic solution INSTILL 1 DROP INTO RIGHT EYE THREE TIMES DAILY    . calcium-vitamin D (OSCAL WITH D) 500-200 MG-UNIT TABS tablet TAKE 1 TABLET BY MOUTH THREE TIMES DAILY 270 tablet 0  . Carboxymethylcellulose Sodium (REFRESH TEARS OP) Place 1 drop into both eyes daily as needed (dry eyes).     . colchicine 0.6 MG tablet Take 1 tablet (0.6 mg total) by mouth daily as needed (psuedogout pain). 30 tablet 2  . ezetimibe (ZETIA) 10 MG tablet TAKE 1 TABLET BY MOUTH ONCE DAILY AFTER  SUPPER 30 tablet 6  . finasteride (PROSCAR) 5 MG tablet Take 5 mg by mouth at bedtime.     . fluticasone (FLONASE) 50 MCG/ACT nasal spray Place 2 sprays into both nostrils daily as needed for allergies or rhinitis.    Marland Kitchen icosapent Ethyl (VASCEPA) 1 g capsule Take 2 capsules (2 g total) by mouth 2 (two) times daily. 120 capsule 6  . Immune Globulin 10% (PRIVIGEN) 10G/151mL (10,000mg /183mL) SOLN Inject 1 g/kg into the vein See admin instructions. 75 g every 21 days for 1 consecutive days    . metFORMIN (GLUCOPHAGE) 500 MG tablet Take 1-2 tablets (500-1,000 mg total) by mouth 2 (two) times daily with a meal. Take 1000mg  in morning with breakfast and 500mg  with dinner (Patient taking differently: Take 500 mg by mouth 2 (two) times daily with a meal.) 270 tablet 3  . mycophenolate (CELLCEPT) 500 MG tablet Take 1.5 tablets (750 mg total) by mouth 2 (two) times daily. 270 tablet 3  . nitroGLYCERIN (NITROSTAT) 0.4 MG SL tablet Place 1 tablet (0.4 mg total) under the tongue every 5 (five) minutes as needed for chest pain. 25 tablet 4  . pantoprazole (PROTONIX) 20 MG tablet Take 1 tablet (20 mg total) by mouth daily. 30 tablet 0  . PRALUENT 150 MG/ML SOAJ INJECT THE CONTENTS OF 1 PEN SUBCUTANEOUSLY EVERY 2 WEEKS. 2 mL 3  . Syringe/Needle, Disp, (SYRINGE 3CC/22GX1") 22G X 1" 3 ML  MISC Use with B12 injections 10 each 0  . tamsulosin (FLOMAX) 0.4 MG CAPS capsule Take 0.4 mg by mouth at bedtime.    . vitamin B-12 (CYANOCOBALAMIN) 1000 MCG tablet Take 1,000 mcg by mouth daily.     No current facility-administered medications on file prior to visit.    Allergies:  Allergies  Allergen Reactions  . Statins Other (See Comments)    Myositis Myositis    Vital Signs:  BP 124/67   Pulse (!) 58   Resp 18   Ht 5\' 9"  (1.753 m)   Wt 194 lb (88 kg)   SpO2 100%   BMI 28.65 kg/m   Neurological Exam: MENTAL STATUS including orientation to time, place, person, recent and remote memory, attention span and  concentration, language, and fund of knowledge is normal.  Speech is not dysarthric.  CRANIAL NERVES: Normal conjugate, extra-ocular eye movements in all directions of gaze.  Moderate right ptosis at rest, no worsening with sustained upgaze.  Icepack test negative. Face is symmetric. Facial muscles are 5/5 throughout.  MOTOR:  Motor strength is 5/5 proximally, R hand is 35/5 (old from stroke).No atrophy, fasciculations or abnormal movements.  No pronator drift.   COORDINATION/GAIT:   Gait narrow based and stable.   Data: MRI cervical spine without contrast 02/17/2019: 1. 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 2. Solid anterior fusion at C4-5 and C5-6 without spinal stenosis at these levels. 3. Adjacent segment degenerative changes with moderate spinal stenosis at C6-7, mild-to-moderate spinal stenosis at C3-4, and mild-to-moderate neural foraminal stenosis at both levels.  MRI lumbar spine 02/17/2019: 1. Relatively diffuse lumbar disc and facet degeneration without significant spinal stenosis. 2. Multilevel neural foraminal stenosis, moderate on the left at L5-S1. 3. Normal appearance of the conus medullaris and cauda equina.  MRI thoracic spine without contrast 02/17/2019: 1. Mild thoracic disc and facet degeneration without evidence of neural impingement. 2. Minimal prominence of the central canal of the mid to lower thoracic spinal cord without syrinx.   Left rectus femorus muscle biopsy 02/20/2019:  Active necrotizing inflammatory myopathy. Right chest wall skin biopsy:  Benign skin with fibrosis and mixed superficial and deep inflammation  Neuromuscular Lab - [facility] 03/20/2019:  Myopathy 2 panel - HMGCR 36,000 (normal <2500)  Lab Results  Component Value Date   CKTOTAL 592 (HH) 09/15/2020   CKMB CANCELED 03/21/2020   TROPONINI 0.03 (HH) 02/17/2019    Lab Results  Component Value Date   HGBA1C 6.7 (H) 08/07/2020    IMPRESSION/PLAN: 1.  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  Clinically stable by exam, however his CK is trending up 361 > 592.  This may be due to combination of being off prednisone and tapering his IVIG.  After much discussion, I have decided to start him on prednisone 20mg  daily and monitor.  He will continue IVIG taper to 8 weeks, then stop Continue Cellcept 750mg  BID  - this can be increased going forward Check CK monthly Check CBC and CMP at next visit  2.  Right ptosis, fluctuating.  AChR negative, icepack test negative which makes myasthenia less likely.  If symptoms do not improve, may consider referring him for single fiber EMG  3.  Optic neuropathy bilaterally, followed by [facility]  Return to clinic in 2 months  Thank you for allowing me to participate in patient's care.  If I can answer any additional questions, I would be pleased to do so.    Sincerely,    Niall Illes K.  Posey Pronto, DO

## 2020-09-18 NOTE — Patient Instructions (Signed)
It was lovely to see you.    Start prednisone 20mg  daily Continue IVIG taper Continue Cellcept 750mg  twie daily Check CK monthly  Merry Christmas and Happy New year  Return to clinic in February

## 2020-10-02 DIAGNOSIS — H02401 Unspecified ptosis of right eyelid: Secondary | ICD-10-CM | POA: Insufficient documentation

## 2020-10-06 ENCOUNTER — Other Ambulatory Visit: Payer: Self-pay | Admitting: Cardiology

## 2020-10-06 DIAGNOSIS — I2510 Atherosclerotic heart disease of native coronary artery without angina pectoris: Secondary | ICD-10-CM

## 2020-10-08 ENCOUNTER — Other Ambulatory Visit: Payer: Self-pay

## 2020-10-08 DIAGNOSIS — I2510 Atherosclerotic heart disease of native coronary artery without angina pectoris: Secondary | ICD-10-CM

## 2020-10-08 MED ORDER — PRALUENT 150 MG/ML ~~LOC~~ SOAJ: SUBCUTANEOUS | 11 refills | Status: DC

## 2020-10-13 ENCOUNTER — Ambulatory Visit (HOSPITAL_COMMUNITY): Payer: Medicare Other

## 2020-10-15 LAB — CK: Total CK: 286 U/L (ref 41–331)

## 2020-10-27 ENCOUNTER — Inpatient Hospital Stay (HOSPITAL_COMMUNITY): Admission: RE | Admit: 2020-10-27 | Payer: Medicare Other | Source: Ambulatory Visit

## 2020-10-29 ENCOUNTER — Other Ambulatory Visit: Payer: Self-pay | Admitting: Family Medicine

## 2020-10-30 ENCOUNTER — Ambulatory Visit (HOSPITAL_COMMUNITY)
Admission: RE | Admit: 2020-10-30 | Discharge: 2020-10-30 | Disposition: A | Discharge status: Home / Self Care | Payer: Medicare Other | Source: Ambulatory Visit | Attending: Neurology | Admitting: Neurology

## 2020-10-30 ENCOUNTER — Other Ambulatory Visit: Payer: Self-pay

## 2020-10-30 DIAGNOSIS — M6080 Other myositis, unspecified site: Secondary | ICD-10-CM | POA: Insufficient documentation

## 2020-10-30 MED ORDER — IMMUNE GLOBULIN (HUMAN) 5 GM/50ML IV SOLN
85.0000 g | INTRAVENOUS | Status: AC
  Administered 2020-10-30: 85 g via INTRAVENOUS
  Filled 2020-10-30: qty 50

## 2020-11-10 ENCOUNTER — Other Ambulatory Visit: Payer: Self-pay

## 2020-11-10 DIAGNOSIS — G7249 Other inflammatory and immune myopathies, not elsewhere classified: Secondary | ICD-10-CM

## 2020-11-12 ENCOUNTER — Telehealth: Payer: Self-pay | Admitting: Neurology

## 2020-11-12 NOTE — Telephone Encounter (Signed)
Mychart message sent to notify patients wife.

## 2020-11-12 NOTE — Telephone Encounter (Signed)
Patient wife called in and said the [facility] the order was faxed to is closed due to no heat. She would like the order faxed to [phone]

## 2020-11-12 NOTE — Telephone Encounter (Signed)
Order has been faxed to [phone] as requested by Patients wife.

## 2020-11-14 ENCOUNTER — Other Ambulatory Visit: Payer: Self-pay | Admitting: Neurology

## 2020-11-15 LAB — CK: Total CK: 431 U/L — ABNORMAL HIGH (ref 41–331)

## 2020-11-20 ENCOUNTER — Other Ambulatory Visit (INDEPENDENT_AMBULATORY_CARE_PROVIDER_SITE_OTHER): Payer: Medicare Other

## 2020-11-20 ENCOUNTER — Ambulatory Visit: Payer: Medicare Other | Admitting: Neurology

## 2020-11-20 ENCOUNTER — Other Ambulatory Visit: Payer: Self-pay

## 2020-11-20 ENCOUNTER — Encounter: Payer: Self-pay | Admitting: Neurology

## 2020-11-20 ENCOUNTER — Telehealth: Payer: Self-pay

## 2020-11-20 VITALS — BP 127/71 | HR 98 | Resp 18 | Ht 69.0 in | Wt 196.0 lb

## 2020-11-20 DIAGNOSIS — G7249 Other inflammatory and immune myopathies, not elsewhere classified: Secondary | ICD-10-CM

## 2020-11-20 DIAGNOSIS — Z5181 Encounter for therapeutic drug level monitoring: Secondary | ICD-10-CM | POA: Diagnosis not present

## 2020-11-20 LAB — COMPREHENSIVE METABOLIC PANEL
ALT: 64 U/L — ABNORMAL HIGH (ref 0–53)
AST: 46 U/L — ABNORMAL HIGH (ref 0–37)
Albumin: 4.3 g/dL (ref 3.5–5.2)
Alkaline Phosphatase: 48 U/L (ref 39–117)
BUN: 21 mg/dL (ref 6–23)
CO2: 31 mEq/L (ref 19–32)
Calcium: 10.5 mg/dL (ref 8.4–10.5)
Chloride: 98 mEq/L (ref 96–112)
Creatinine, Ser: 1.04 mg/dL (ref 0.40–1.50)
GFR: 74.26 mL/min (ref >60.00)
Glucose, Bld: 140 mg/dL — ABNORMAL HIGH (ref 70–99)
Potassium: 4.8 mEq/L (ref 3.5–5.1)
Sodium: 136 mEq/L (ref 135–145)
Total Bilirubin: 0.3 mg/dL (ref 0.2–1.2)
Total Protein: 7.6 g/dL (ref 6.0–8.3)

## 2020-11-20 LAB — CK: Total CK: 513 U/L — ABNORMAL HIGH (ref 7–232)

## 2020-11-20 LAB — CBC
HCT: 42.4 % (ref 39.0–52.0)
Hemoglobin: 14.4 g/dL (ref 13.0–17.0)
MCHC: 33.9 g/dL (ref 30.0–36.0)
MCV: 90.3 fl (ref 78.0–100.0)
Platelets: 220 10*3/uL (ref 150.0–400.0)
RBC: 4.69 Mil/uL (ref 4.22–5.81)
RDW: 14.2 % (ref 11.5–15.5)
WBC: 8.7 10*3/uL (ref 4.0–10.5)

## 2020-11-20 MED ORDER — MYCOPHENOLATE MOFETIL 500 MG PO TABS: 1000.0000 mg | ORAL_TABLET | Freq: Two times a day (BID) | ORAL | 3 refills | Status: DC

## 2020-11-20 NOTE — Patient Instructions (Addendum)
Check labs  Continue monthly CK  We will schedule IVIG in April, then stop.  Increase Cellcept to 1000mg  twice daily  Continue prednisone 20mg  daily  Return to clinic in 2 months  Your provider has requested that you have labwork completed today. Please go to [facility] ([id number]) on the second floor of this building before leaving the office today. You do not need to check in. If you are not called within 15 minutes please check with the front desk.

## 2020-11-20 NOTE — Telephone Encounter (Signed)
Patient is scheduled at [facility] short stay, Infusion scheduled for April 4th at 8am. Patient notified and labs were mailed to patient for future lab order. Patient advised and understood.

## 2020-11-20 NOTE — Progress Notes (Signed)
Follow-up Visit   Date: 11/20/20   Stanley Boyd MRN: [id number] DOB: 1951-05-24   Interim History: Stanley Boyd is a 70 y.o. 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  The patient was accompanied to the clinic by wife who also provides collateral information.    Since his last visit, he denies any new symptoms.  Overall, his motor strength, vision, and ptosis has been stable.  He has been referred to oculoplastics by [facility] ophthalmology for his ptosis. His last CK has also been stable (431).  Specifically, no new weakness. He last had IVIG in January.     Medications:  Current Outpatient Medications on File Prior to Visit  Medication Sig Dispense Refill  . Alirocumab (PRALUENT) 150 MG/ML SOAJ INJECT CONTENTS OF ONE PEN SUB-Q EVERY 2 WEEKS 2 mL 11  . aspirin 81 MG chewable tablet Chew 1 tablet (81 mg total) by mouth at bedtime.    . brimonidine (ALPHAGAN) 0.2 % ophthalmic solution INSTILL 1 DROP INTO RIGHT EYE THREE TIMES DAILY    . calcium-vitamin D (OSCAL WITH D) 500-200 MG-UNIT TABS tablet Take 1 tablet by mouth 3 (three) times daily. 270 tablet 3  . Carboxymethylcellulose Sodium (REFRESH TEARS OP) Place 1 drop into both eyes daily as needed (dry eyes).     . finasteride (PROSCAR) 5 MG tablet Take 5 mg by mouth at bedtime.     . fluticasone (FLONASE) 50 MCG/ACT nasal spray Place 2 sprays into both nostrils daily as needed for allergies or rhinitis.    Marland Kitchen icosapent Ethyl (VASCEPA) 1 g capsule Take 2 capsules (2 g total) by mouth 2 (two) times daily. 120 capsule 6  . Immune Globulin 10% (PRIVIGEN) 10G/168mL (10,000mg /174mL) SOLN Inject 1 g/kg into the vein See admin instructions. 75 g every 21 days for 1 consecutive days    . metFORMIN  (GLUCOPHAGE) 500 MG tablet Take 1-2 tablets (500-1,000 mg total) by mouth 2 (two) times daily with a meal. Take 1000mg  in morning with breakfast and 500mg  with dinner (Patient taking differently: Take 500 mg by mouth 2 (two) times daily with a meal. Take one tablet in the AM and one tablet in the PM) 270 tablet 3  . mycophenolate (CELLCEPT) 500 MG tablet Take 1.5 tablets (750 mg total) by mouth 2 (two) times daily. 270 tablet 3  . nitroGLYCERIN (NITROSTAT) 0.4 MG SL tablet Place 1 tablet (0.4 mg total) under the tongue every 5 (five) minutes as needed for chest pain. 25 tablet 4  . pantoprazole (PROTONIX) 20 MG tablet Take 1 tablet by mouth once daily 90 tablet 0  . predniSONE (DELTASONE) 10 MG tablet Take 2 tablets (20 mg total) by mouth daily with breakfast. 180 tablet 3  . Syringe/Needle, Disp, (SYRINGE 3CC/22GX1") 22G X 1" 3 ML MISC Use with B12 injections 10 each 0  . tamsulosin (FLOMAX) 0.4 MG CAPS capsule Take 0.4 mg by mouth at bedtime.    . vitamin B-12 (CYANOCOBALAMIN) 1000 MCG tablet Take 1,000 mcg by mouth daily.    . colchicine 0.6 MG tablet Take 1 tablet (0.6 mg total) by mouth daily as needed (psuedogout pain). (Patient not taking: Reported on 11/20/2020) 30 tablet 2  . ezetimibe (ZETIA) 10 MG tablet TAKE 1 TABLET BY MOUTH ONCE DAILY AFTER SUPPER 30 tablet 6  No current facility-administered medications on file prior to visit.    Allergies:  Allergies  Allergen Reactions  . Statins Other (See Comments)    Myositis Myositis    Vital Signs:  BP 127/71   Pulse 98   Resp 18   Ht 5\' 9"  (1.753 m)   Wt 196 lb (88.9 kg)   SpO2 96%   BMI 28.94 kg/m   Neurological Exam: MENTAL STATUS including orientation to time, place, person, recent and remote memory, attention span and concentration, language, and fund of knowledge is normal.  Speech is not dysarthric.  CRANIAL NERVES: Normal conjugate, extra-ocular eye movements in all directions of gaze.  Moderate right ptosis at rest,  no worsening with sustained upgaze.   MOTOR:  Motor strength is 5/5 proximally, R hand is 52/5 (old from stroke).No atrophy, fasciculations or abnormal movements.  No pronator drift.   COORDINATION/GAIT:   Gait narrow based and stable.   Data: MRI cervical spine without contrast 02/17/2019: 1. 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 2. Solid anterior fusion at C4-5 and C5-6 without spinal stenosis at these levels. 3. Adjacent segment degenerative changes with moderate spinal stenosis at C6-7, mild-to-moderate spinal stenosis at C3-4, and mild-to-moderate neural foraminal stenosis at both levels.  MRI lumbar spine 02/17/2019: 1. Relatively diffuse lumbar disc and facet degeneration without significant spinal stenosis. 2. Multilevel neural foraminal stenosis, moderate on the left at L5-S1. 3. Normal appearance of the conus medullaris and cauda equina.  MRI thoracic spine without contrast 02/17/2019: 1. Mild thoracic disc and facet degeneration without evidence of neural impingement. 2. Minimal prominence of the central canal of the mid to lower thoracic spinal cord without syrinx.  Left rectus femorus muscle biopsy 02/20/2019:  Active necrotizing inflammatory myopathy. Right chest wall skin biopsy:  Benign skin with fibrosis and mixed superficial and deep inflammation  Neuromuscular Lab - [facility] 03/20/2019:  Myopathy 2 panel - HMGCR 36,000 (normal <2500)  Lab Results  Component Value Date   CKTOTAL 431 (H) 11/14/2020   CKMB CANCELED 03/21/2020   TROPONINI 0.03 (HH) 02/17/2019    Lab Results  Component Value Date   HGBA1C 6.7 (H) 08/07/2020    IMPRESSION/PLAN: 1.  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  Clinically stable by exam, however his CK has been trending up slightly 361 > 592 > 431.  There has been no weakness on his exam.  Increase Cellcept to 1000mg  BID Continue prednisone 20mg  daily Reduce IVIG to every 12 weeks, then stop. Check CBC and CMP today Follow  CK monthly  2.  Right ptosis, fluctuating. AChR negative.  3.  Optic neuropathy bilaterally, followed by [facility]  Return to clinic in 2 months  Thank you for allowing me to participate in patient's care.  If I can answer any additional questions, I would be pleased to do so.    Sincerely,    Nycere Presley K. Posey Pronto, DO

## 2020-12-11 ENCOUNTER — Other Ambulatory Visit: Payer: Self-pay | Admitting: Neurology

## 2020-12-11 ENCOUNTER — Other Ambulatory Visit: Payer: Self-pay | Admitting: Family Medicine

## 2020-12-12 ENCOUNTER — Telehealth: Payer: Self-pay | Admitting: Neurology

## 2020-12-12 LAB — COMPREHENSIVE METABOLIC PANEL
ALT: 63 IU/L — ABNORMAL HIGH (ref 0–44)
AST: 45 IU/L — ABNORMAL HIGH (ref 0–40)
Albumin/Globulin Ratio: 1.7 (ref 1.2–2.2)
Albumin: 4.5 g/dL (ref 3.8–4.8)
Alkaline Phosphatase: 50 IU/L (ref 44–121)
BUN/Creatinine Ratio: 21 (ref 10–24)
BUN: 21 mg/dL (ref 8–27)
Bilirubin Total: 0.5 mg/dL (ref 0.0–1.2)
CO2: 24 mmol/L (ref 20–29)
Calcium: 10.2 mg/dL (ref 8.6–10.2)
Chloride: 98 mmol/L (ref 96–106)
Creatinine, Ser: 0.99 mg/dL (ref 0.76–1.27)
Globulin, Total: 2.7 g/dL (ref 1.5–4.5)
Glucose: 139 mg/dL — ABNORMAL HIGH (ref 65–99)
Potassium: 5 mmol/L (ref 3.5–5.2)
Sodium: 140 mmol/L (ref 134–144)
Total Protein: 7.2 g/dL (ref 6.0–8.5)
eGFR: 83 mL/min/{1.73_m2} (ref >59)

## 2020-12-12 LAB — CBC/DIFF AMBIGUOUS DEFAULT
Basophils Absolute: 0.1 x10E3/uL (ref 0.0–0.2)
Basos: 1 %
EOS (ABSOLUTE): 0 x10E3/uL (ref 0.0–0.4)
Eos: 0 %
Hematocrit: 41.8 % (ref 37.5–51.0)
Hemoglobin: 14.2 g/dL (ref 13.0–17.7)
Immature Grans (Abs): 0.1 x10E3/uL (ref 0.0–0.1)
Immature Granulocytes: 1 %
Lymphocytes Absolute: 1.7 x10E3/uL (ref 0.7–3.1)
Lymphs: 20 %
MCH: 30.7 pg (ref 26.6–33.0)
MCHC: 34 g/dL (ref 31.5–35.7)
MCV: 91 fL (ref 79–97)
Monocytes Absolute: 0.5 x10E3/uL (ref 0.1–0.9)
Monocytes: 6 %
Neutrophils Absolute: 6.3 x10E3/uL (ref 1.4–7.0)
Neutrophils: 72 %
Platelets: 223 x10E3/uL (ref 150–450)
RBC: 4.62 x10E6/uL (ref 4.14–5.80)
RDW: 13.3 % (ref 11.6–15.4)
WBC: 8.6 x10E3/uL (ref 3.4–10.8)

## 2020-12-12 LAB — HEMOGLOBIN A1C
Est. average glucose Bld gHb Est-mCnc: 171 mg/dL
Hgb A1c MFr Bld: 7.6 % — ABNORMAL HIGH (ref 4.8–5.6)

## 2020-12-12 LAB — CK TOTAL AND CKMB (NOT AT ARMC)
CK-MB Index: 14.5 ng/mL (ref 0.0–10.4)
Total CK: 716 U/L (ref 41–331)

## 2020-12-12 LAB — SPECIMEN STATUS REPORT

## 2020-12-12 LAB — MICROALBUMIN, URINE: Microalbumin, Urine: 70.1 ug/mL

## 2020-12-12 NOTE — Telephone Encounter (Signed)
[facility] called [facility] in error, giving results to pt's urgent lab. Below are the results:    CKMB 14.5 and an alert on his CK at 716

## 2020-12-14 ENCOUNTER — Other Ambulatory Visit: Payer: Self-pay | Admitting: Cardiology

## 2020-12-14 DIAGNOSIS — I2510 Atherosclerotic heart disease of native coronary artery without angina pectoris: Secondary | ICD-10-CM

## 2020-12-15 ENCOUNTER — Encounter: Payer: Self-pay | Admitting: Family Medicine

## 2020-12-16 ENCOUNTER — Telehealth: Payer: Self-pay | Admitting: Neurology

## 2020-12-16 NOTE — Telephone Encounter (Signed)
Called and informed patient that his CK is trending up, now 716.  He does not report any weakness.  His immunotherapy is being adjusted to try to wean off IVIG and slight bump in CK is most likely a reflection of this.  If he develops weakness or CK rises > 1000, IVIG frequency will be increased.  At his last visit, Cellcept was increased to 1000mg  BID and he remains on prednisone 20mg .   It looks like his last CK was ordered with CK MB which is also mildly elevated.  He denies chest pain.  I think this finding is because his muscle CK is elevated, not a true reflection of cardiac event.  I will keep his cardiologist updated.  He will follow-up with me in April and have CK checked prior.  Stanley Marcucci K. Posey Pronto, DO

## 2020-12-16 NOTE — Telephone Encounter (Signed)
Got it. Agree with your assessment. JG

## 2020-12-25 ENCOUNTER — Encounter (HOSPITAL_COMMUNITY): Payer: Medicare Other

## 2020-12-29 ENCOUNTER — Encounter (HOSPITAL_COMMUNITY): Payer: Medicare Other

## 2021-01-06 NOTE — Progress Notes (Signed)
Phone [phone] In person visit   Subjective:   Stanley Boyd is a 70 y.o. year old very pleasant male patient who presents for/with See problem oriented charting Chief Complaint  Patient presents with  . Hyperlipidemia  . B12 Deficiency   This visit occurred during the SARS-CoV-2 public health emergency.  Safety protocols were in place, including screening questions prior to the visit, additional usage of staff PPE, and extensive cleaning of exam room while observing appropriate contact time as indicated for disinfecting solutions.   Past Medical History-  Patient Active Problem List   Diagnosis Date Noted  . Optic neuropathy 06/19/2020    Priority: High  . Steroid-induced hyperglycemia 03/23/2019    Priority: High  . 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 CAD (coronary artery disease), native coronary artery 07/13/2015    Priority: High  . S/P PTCA (percutaneous transluminal coronary angioplasty) 07/08/2015    Priority: High  . Elevated PSA 02/25/2017    Priority: Medium  . Hemiparesis ([place]) 09/18/2014    Priority: Medium  . BPH (benign prostatic hyperplasia) 09/18/2014    Priority: Medium  . Hyperlipidemia     Priority: Medium  . Bradycardia     Priority: Medium  . Polyp of sigmoid colon     Priority: Low  . B12 deficiency 02/26/2019    Priority: Low  . History of colonic polyps 10/15/2014    Priority: Low  . Transaminitis 09/18/2014    Priority: Low  . Proximal muscle weakness 02/26/2019    Medications- reviewed and updated Current Outpatient Medications  Medication Sig Dispense Refill  . Alirocumab (PRALUENT) 150 MG/ML SOAJ INJECT CONTENTS OF ONE PEN SUB-Q EVERY 2 WEEKS 2 mL 11  . aspirin 81 MG chewable tablet Chew 1 tablet (81 mg total) by mouth at bedtime.    . brimonidine (ALPHAGAN) 0.2 % ophthalmic solution INSTILL 1 DROP INTO RIGHT EYE THREE TIMES DAILY    . calcium-vitamin D (OSCAL WITH D) 500-200 MG-UNIT TABS tablet Take  1 tablet by mouth 3 (three) times daily. 270 tablet 3  . Carboxymethylcellulose Sodium (REFRESH TEARS OP) Place 1 drop into both eyes daily as needed (dry eyes).     . ezetimibe (ZETIA) 10 MG tablet TAKE 1 TABLET BY MOUTH ONCE DAILY AFTER SUPPER 30 tablet 0  . finasteride (PROSCAR) 5 MG tablet Take 5 mg by mouth at bedtime.     . fluticasone (FLONASE) 50 MCG/ACT nasal spray Place 2 sprays into both nostrils daily as needed for allergies or rhinitis.    Marland Kitchen icosapent Ethyl (VASCEPA) 1 g capsule Take 2 capsules (2 g total) by mouth 2 (two) times daily. 120 capsule 6  . Immune Globulin 10% (PRIVIGEN) 10G/122mL (10,000mg /125mL) SOLN Inject 1 g/kg into the vein See admin instructions. 75 g every 21 days for 1 consecutive days    . metFORMIN (GLUCOPHAGE) 500 MG tablet Take 1-2 tablets (500-1,000 mg total) by mouth 2 (two) times daily with a meal. Take 1000mg  in morning with breakfast and 500mg  with dinner (Patient taking differently: Take 500 mg by mouth 2 (two) times daily with a meal. Two Tablets twice a day) 270 tablet 3  . mycophenolate (CELLCEPT) 500 MG tablet Take 2 tablets (1,000 mg total) by mouth 2 (two) times daily. 360 tablet 3  . nitroGLYCERIN (NITROSTAT) 0.4 MG SL tablet Place 1 tablet (0.4 mg total) under the tongue every 5 (five) minutes as needed for chest pain. 25 tablet 4  . pantoprazole (PROTONIX)  20 MG tablet Take 1 tablet by mouth once daily 90 tablet 0  . predniSONE (DELTASONE) 10 MG tablet Take 2 tablets (20 mg total) by mouth daily with breakfast. 180 tablet 3  . sildenafil (VIAGRA) 100 MG tablet Take 1 tablet (100 mg total) by mouth daily as needed for erectile dysfunction. 10 tablet 5  . Syringe/Needle, Disp, (SYRINGE 3CC/22GX1") 22G X 1" 3 ML MISC Use with B12 injections 10 each 0  . tamsulosin (FLOMAX) 0.4 MG CAPS capsule Take 0.4 mg by mouth at bedtime.    . vitamin B-12 (CYANOCOBALAMIN) 1000 MCG tablet Take 1,000 mcg by mouth daily.    . colchicine 0.6 MG tablet Take 1 tablet  (0.6 mg total) by mouth daily as needed (psuedogout pain). (Patient not taking: No sig reported) 30 tablet 2   No current facility-administered medications for this visit.     Objective:  BP 110/60   Pulse (!) 52   Temp 98.1 F (36.7 C) (Temporal)   Ht 5\' 9"  (1.753 m)   Wt 195 lb 6.4 oz (88.6 kg)   SpO2 96%   BMI 28.86 kg/m  Gen: NAD, resting comfortably CV: RRR no murmurs rubs or gallops Lungs: CTAB no crackles, wheeze, rhonchi Abdomen: soft/nontender/nondistended/normal bowel sounds. No rebound or guarding.  Ext: no edema Skin: warm, dry Neuro: right sided weakness noted 5-/5 on right compared to 5/5 on left- mild- much improved from prior    Assessment and Plan   #Autoimmune necrotizing myopathy S: Patient remains on CellCept and IVIG. CK up slightly from 513 to 716 last check.  Lab Results  Component Value Date   CKTOTAL 716 ([place]) 12/11/2020   CKMB CANCELED 03/21/2020   CKMBINDEX 14.5 (HH) 12/11/2020   TROPONINI 0.03 (HH) 02/17/2019  A/P:  On 3 month IVIG and needs another level today to make sure not further increased such as above 1000- will draw today  #Optic neuropathy bilateral eyes-originally cared for by [facility] and later transitioned to [facility] ophthalmology.  Required high-dose steroids-thought potentially related to moderna vaccination-had severe symptoms around time of vaccination. Waiting on oculoplastic surgeon to raise eyelid- wont be until July. Avoiding covid vaccine as a result of prior issues.   #Steroid-induced hyperglycemia S: Medication: metformin 500mg  twice daily now up to 1000mg  twice a day after a1c up in early march (had to go back on prednisone and affected sugars) - no recent checks Lab Results  Component Value Date   HGBA1C 7.6 (H) 12/11/2020   HGBA1C 6.7 (H) 08/07/2020   HGBA1C 7.4 (H) 04/18/2020  A/P: hopefully improving back on metformin 1000mg  twice daily  #CAD- #hyperlipidemia S: Medication: Aspirin 81 mg, Vascepa, Praluent,  Zetia  Last visit patient was walking 2 miles a gym 2 days a week without chest pain or shortness of breath.  Today reports continues to do well with regimen  Lab Results  Component Value Date   CHOL 117 04/18/2020   HDL 40 04/18/2020   LDLCALC 51 04/18/2020   LDLDIRECT 58 02/26/2020   TRIG 155 (H) 04/18/2020   CHOLHDL 6 06/28/2019   A/P: CAD asymptomatic Lipids- have been at goal- repeat next time  #LFT - mild elevations no recent worsening- continue current meds as long as aslt/ast under 100 Lab Results  Component Value Date   ALT 63 (H) 12/11/2020   AST 45 (H) 12/11/2020   ALKPHOS 50 12/11/2020   BILITOT 0.5 12/11/2020   # B12 deficiency S: Current treatment/medication (oral vs. IM): cyanocobalamin 1065mcg orally-Metformin  and PPI likely contributed Lab Results  Component Value Date   VITAMINB12 610 08/07/2020  A/P: suspect stable- continue current meds  #elevated tsh- very mild- update with labs today Lab Results  Component Value Date   TSH 4.70 (H) 05/16/2020   #ED- worse with finasteride (psa trending down on this). Asks about trying viagra . we are willing to try but if has chest pain or shorntess of breath would go to hospital and not take nitroglycerin (has not taken nitroglycerin)  Recommended follow up: Return in about 4 months (around 05/10/2021) for follow up- or sooner if needed. Future Appointments  Date Time Provider [place]  01/12/2021  8:00 AM [id number] None  01/22/2021  8:50 AM Narda Amber K, DO [facility] None  06/12/2021  1:00 PM Adrian Prows, MD [facility] None    Lab/Order associations:   ICD-10-CM   1. Hyperlipidemia, unspecified hyperlipidemia type  E78.5   2. B12 deficiency  E53.8   3. Steroid-induced hyperglycemia  R73.9 Microalbumin / creatinine urine ratio   T38.0X5A   4. Coronary artery disease involving native coronary artery of native heart with other form of angina pectoris ([place])  I25.118   5. Autoimmune necrotizing myopathy   G72.49 CK  6. 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 Elevated TSH  R79.89 TSH    Meds ordered this encounter  Medications  . sildenafil (VIAGRA) 100 MG tablet    Sig: Take 1 tablet (100 mg total) by mouth daily as needed for erectile dysfunction.    Dispense:  10 tablet    Refill:  5   Return precautions advised.  Garret Reddish, MD

## 2021-01-06 NOTE — Patient Instructions (Addendum)
Please stop by lab before you go If you have mychart- we will send your results within 3 business days of [place] receiving them.  If you do not have mychart- we will call you about results within 5 business days of [place] receiving them.  *please also note that you will see labs on mychart as soon as they post. I will later go in and write notes on them- will say "notes from Dr. Yong Channel"  Try half tablet of generic viagra before sex. Do not use nitroglycerin within 48 hours of use- if you were to have chest pain, abnormal fatigue, shortness of breath- go to hospital after use.   Recommended follow up: Return in about 4 months (around 05/10/2021) for follow up- or sooner if needed.

## 2021-01-08 ENCOUNTER — Ambulatory Visit: Payer: Medicare Other | Admitting: Family Medicine

## 2021-01-08 ENCOUNTER — Other Ambulatory Visit: Payer: Self-pay

## 2021-01-08 ENCOUNTER — Encounter: Payer: Self-pay | Admitting: Family Medicine

## 2021-01-08 VITALS — BP 110/60 | HR 52 | Temp 98.1°F | Ht 69.0 in | Wt 195.4 lb

## 2021-01-08 DIAGNOSIS — R7989 Other specified abnormal findings of blood chemistry: Secondary | ICD-10-CM

## 2021-01-08 DIAGNOSIS — I25118 Atherosclerotic heart disease of native coronary artery with other forms of angina pectoris: Secondary | ICD-10-CM | POA: Diagnosis not present

## 2021-01-08 DIAGNOSIS — G7249 Other inflammatory and immune myopathies, not elsewhere classified: Secondary | ICD-10-CM

## 2021-01-08 DIAGNOSIS — E785 Hyperlipidemia, unspecified: Secondary | ICD-10-CM | POA: Diagnosis not present

## 2021-01-08 DIAGNOSIS — I69959 Hemiplegia and hemiparesis following unspecified cerebrovascular disease affecting unspecified side: Secondary | ICD-10-CM

## 2021-01-08 DIAGNOSIS — R739 Hyperglycemia, unspecified: Secondary | ICD-10-CM | POA: Diagnosis not present

## 2021-01-08 DIAGNOSIS — E538 Deficiency of other specified B group vitamins: Secondary | ICD-10-CM | POA: Diagnosis not present

## 2021-01-08 DIAGNOSIS — T380X5A Adverse effect of glucocorticoids and synthetic analogues, initial encounter: Secondary | ICD-10-CM | POA: Diagnosis not present

## 2021-01-08 LAB — TSH: TSH: 1.74 u[IU]/mL (ref 0.35–4.50)

## 2021-01-08 LAB — CK: Total CK: 745 U/L — ABNORMAL HIGH (ref 7–232)

## 2021-01-08 LAB — MICROALBUMIN / CREATININE URINE RATIO
Creatinine,U: 135.2 mg/dL
Microalb Creat Ratio: 3.3 mg/g (ref 0.0–30.0)
Microalb, Ur: 4.5 mg/dL — ABNORMAL HIGH (ref 0.0–1.9)

## 2021-01-08 MED ORDER — SILDENAFIL CITRATE 100 MG PO TABS: 100.0000 mg | ORAL_TABLET | Freq: Every day | ORAL | 5 refills | Status: DC | PRN

## 2021-01-09 ENCOUNTER — Other Ambulatory Visit: Payer: Self-pay

## 2021-01-12 ENCOUNTER — Ambulatory Visit (HOSPITAL_COMMUNITY)
Admission: RE | Admit: 2021-01-12 | Discharge: 2021-01-12 | Disposition: A | Discharge status: Home / Self Care | Payer: Medicare Other | Source: Ambulatory Visit | Attending: Neurology | Admitting: Neurology

## 2021-01-12 DIAGNOSIS — G7281 Critical illness myopathy: Secondary | ICD-10-CM | POA: Diagnosis not present

## 2021-01-12 MED ORDER — IMMUNE GLOBULIN (HUMAN) 10 GM/100ML IV SOLN
1.0000 g/kg | Freq: Once | INTRAVENOUS | Status: AC
  Administered 2021-01-12: 90 g via INTRAVENOUS
  Filled 2021-01-12: qty 800

## 2021-01-15 ENCOUNTER — Other Ambulatory Visit: Payer: Self-pay | Admitting: Cardiology

## 2021-01-15 DIAGNOSIS — I2510 Atherosclerotic heart disease of native coronary artery without angina pectoris: Secondary | ICD-10-CM

## 2021-01-21 NOTE — Progress Notes (Signed)
Follow-up Visit   Date: 01/21/21   Stanley Boyd MRN: [id number] DOB: 1951/03/07   Interim History: Stanley Boyd is a 70 y.o. 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  The patient was accompanied to the clinic by wife who also provides collateral information.    His last CK continued to rise ~700, however, he denies weakness.  He remains very active and able to do his usual activities.  IVIG was on 4/4 and he tolerated it well.  No new complaints.  His vision is unchanged, he still feels as if there is a film over his right eye. He will be seeing neurophthalmology in June.      Medications:  Current Outpatient Medications on File Prior to Visit  Medication Sig Dispense Refill  . Alirocumab (PRALUENT) 150 MG/ML SOAJ INJECT CONTENTS OF ONE PEN SUB-Q EVERY 2 WEEKS 2 mL 11  . aspirin 81 MG chewable tablet Chew 1 tablet (81 mg total) by mouth at bedtime.    . brimonidine (ALPHAGAN) 0.2 % ophthalmic solution INSTILL 1 DROP INTO RIGHT EYE THREE TIMES DAILY    . calcium-vitamin D (OSCAL WITH D) 500-200 MG-UNIT TABS tablet Take 1 tablet by mouth 3 (three) times daily. 270 tablet 3  . Carboxymethylcellulose Sodium (REFRESH TEARS OP) Place 1 drop into both eyes daily as needed (dry eyes).     . colchicine 0.6 MG tablet Take 1 tablet (0.6 mg total) by mouth daily as needed (psuedogout pain). (Patient not taking: No sig reported) 30 tablet 2  . ezetimibe (ZETIA) 10 MG tablet TAKE 1 TABLET BY MOUTH ONCE DAILY AFTER SUPPER 30 tablet 0  . finasteride (PROSCAR) 5 MG tablet Take 5 mg by mouth at bedtime.     . fluticasone (FLONASE) 50 MCG/ACT nasal spray Place 2 sprays into both nostrils daily as needed for allergies or rhinitis.    Marland Kitchen icosapent Ethyl  (VASCEPA) 1 g capsule Take 2 capsules (2 g total) by mouth 2 (two) times daily. 120 capsule 6  . Immune Globulin 10% (PRIVIGEN) 10G/115mL (10,000mg /170mL) SOLN Inject 1 g/kg into the vein See admin instructions. 75 g every 21 days for 1 consecutive days    . metFORMIN (GLUCOPHAGE) 500 MG tablet Take 1-2 tablets (500-1,000 mg total) by mouth 2 (two) times daily with a meal. Take 1000mg  in morning with breakfast and 500mg  with dinner (Patient taking differently: Take 500 mg by mouth 2 (two) times daily with a meal. Two Tablets twice a day) 270 tablet 3  . mycophenolate (CELLCEPT) 500 MG tablet Take 2 tablets (1,000 mg total) by mouth 2 (two) times daily. 360 tablet 3  . nitroGLYCERIN (NITROSTAT) 0.4 MG SL tablet Place 1 tablet (0.4 mg total) under the tongue every 5 (five) minutes as needed for chest pain. 25 tablet 4  . pantoprazole (PROTONIX) 20 MG tablet Take 1 tablet by mouth once daily 90 tablet 0  . predniSONE (DELTASONE) 10 MG tablet Take 2 tablets (20 mg total) by mouth daily with breakfast. 180 tablet 3  . sildenafil (VIAGRA) 100 MG tablet Take 1 tablet (100 mg total) by mouth daily as needed for erectile dysfunction. 10 tablet 5  . Syringe/Needle, Disp, (SYRINGE 3CC/22GX1") 22G X 1" 3 ML MISC Use with B12 injections 10 each 0  . tamsulosin (FLOMAX) 0.4 MG CAPS  capsule Take 0.4 mg by mouth at bedtime.    . vitamin B-12 (CYANOCOBALAMIN) 1000 MCG tablet Take 1,000 mcg by mouth daily.     No current facility-administered medications on file prior to visit.    Allergies:  Allergies  Allergen Reactions  . Statins Other (See Comments)    Myositis Myositis  . Covid-19 Mrna Vacc (Moderna)     Optic neuropathy after original 2 vaccines     Vital Signs:  There were no vitals taken for this visit.  Neurological Exam: MENTAL STATUS including orientation to time, place, person, recent and remote memory, attention span and concentration, language, and fund of knowledge is normal.  Speech is not  dysarthric.  CRANIAL NERVES: Normal conjugate, extra-ocular eye movements in all directions of gaze.  Moderate right ptosis at rest, no worsening with sustained upgaze.   MOTOR:  Motor strength is 5/5 proximally, R hand is 35/5 (old from stroke).No atrophy, fasciculations or abnormal movements.  No pronator drift.   COORDINATION/GAIT:   Gait narrow based and stable.   Data: MRI cervical spine without contrast 02/17/2019: 1. 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 2. Solid anterior fusion at C4-5 and C5-6 without spinal stenosis at these levels. 3. Adjacent segment degenerative changes with moderate spinal stenosis at C6-7, mild-to-moderate spinal stenosis at C3-4, and mild-to-moderate neural foraminal stenosis at both levels.  MRI lumbar spine 02/17/2019: 1. Relatively diffuse lumbar disc and facet degeneration without significant spinal stenosis. 2. Multilevel neural foraminal stenosis, moderate on the left at L5-S1. 3. Normal appearance of the conus medullaris and cauda equina.  MRI thoracic spine without contrast 02/17/2019: 1. Mild thoracic disc and facet degeneration without evidence of neural impingement. 2. Minimal prominence of the central canal of the mid to lower thoracic spinal cord without syrinx.  Left rectus femorus muscle biopsy 02/20/2019:  Active necrotizing inflammatory myopathy. Right chest wall skin biopsy:  Benign skin with fibrosis and mixed superficial and deep inflammation  Neuromuscular Lab - [facility] 03/20/2019:  Myopathy 2 panel - HMGCR 36,000 (normal <2500)  Lab Results  Component Value Date   CKTOTAL 745 (H) 01/08/2021   CKMB CANCELED 03/21/2020   CKMBINDEX 14.5 (HH) 12/11/2020   TROPONINI 0.03 (HH) 02/17/2019    Lab Results  Component Value Date   HGBA1C 7.6 (H) 12/11/2020    IMPRESSION/PLAN: 1.  Anti-HMGCR immune mediated myopathy, diagnosed 02/2019.  Exam remains stable with full strength, however, CK is trending up.  I have been weaning his medication  and it's possible this is reflecting CK rise.  In February, his Cellcept was increased to 1000mg  BID, as I try to taper off IVIG.    - Continue Cellcept 1000mg  BID  - Continue prednisone 20mg  daily  - Last IVIG on 4/4, with plans to stop.    - If he develops weakness or CK > 1000, then IVIG will need to be restarted  - Check CK monthly  2.  Right ptosis, fluctuating. AChR negative.  3.  Optic neuropathy bilaterally, followed by [facility] neuro[facility]hthal[facility]  Return to clinic in 3 months  Thank you for allowing me to participate in patient's care.  If I can answer any additional questions, I would be pleased to do so.    Sincerely,    Nikhita Mentzel K. Posey Pronto, DO

## 2021-01-22 ENCOUNTER — Ambulatory Visit: Payer: Medicare Other | Admitting: Neurology

## 2021-01-22 ENCOUNTER — Encounter: Payer: Self-pay | Admitting: Neurology

## 2021-01-22 ENCOUNTER — Other Ambulatory Visit: Payer: Self-pay

## 2021-01-22 VITALS — BP 120/71 | HR 59 | Ht 69.0 in | Wt 190.0 lb

## 2021-01-22 DIAGNOSIS — G7249 Other inflammatory and immune myopathies, not elsewhere classified: Secondary | ICD-10-CM | POA: Diagnosis not present

## 2021-01-22 DIAGNOSIS — Z5181 Encounter for therapeutic drug level monitoring: Secondary | ICD-10-CM

## 2021-01-22 NOTE — Patient Instructions (Signed)
Continue your medications as you are taking  Check monthly CK  Return to clinic 3 months

## 2021-02-09 ENCOUNTER — Other Ambulatory Visit: Payer: Self-pay

## 2021-02-09 ENCOUNTER — Other Ambulatory Visit: Payer: Self-pay | Admitting: Family Medicine

## 2021-02-09 ENCOUNTER — Other Ambulatory Visit: Payer: Self-pay | Admitting: Neurology

## 2021-02-09 DIAGNOSIS — I2510 Atherosclerotic heart disease of native coronary artery without angina pectoris: Secondary | ICD-10-CM

## 2021-02-09 NOTE — Progress Notes (Signed)
Error trying to refill medication.

## 2021-02-10 ENCOUNTER — Telehealth: Payer: Self-pay | Admitting: Neurology

## 2021-02-10 LAB — CK TOTAL AND CKMB (NOT AT ARMC)
CK-MB Index: 11.8 ng/mL (ref 0.0–10.4)
Total CK: 559 U/L (ref 41–331)

## 2021-02-10 NOTE — Telephone Encounter (Signed)
CK 559, which is down from previously (CK 745).  Will continue to follow.

## 2021-02-13 ENCOUNTER — Telehealth: Payer: Self-pay

## 2021-02-13 DIAGNOSIS — I2510 Atherosclerotic heart disease of native coronary artery without angina pectoris: Secondary | ICD-10-CM

## 2021-02-13 DIAGNOSIS — E7800 Pure hypercholesterolemia, unspecified: Secondary | ICD-10-CM

## 2021-02-13 MED ORDER — ICOSAPENT ETHYL 1 G PO CAPS: 2.0000 g | ORAL_CAPSULE | Freq: Two times a day (BID) | ORAL | 6 refills | Status: DC

## 2021-02-13 MED ORDER — EZETIMIBE 10 MG PO TABS: 10.0000 mg | ORAL_TABLET | Freq: Every day | ORAL | 3 refills | Status: DC

## 2021-02-13 NOTE — Telephone Encounter (Signed)
Done

## 2021-02-16 ENCOUNTER — Other Ambulatory Visit: Payer: Self-pay

## 2021-02-16 DIAGNOSIS — E7800 Pure hypercholesterolemia, unspecified: Secondary | ICD-10-CM

## 2021-02-16 DIAGNOSIS — I2510 Atherosclerotic heart disease of native coronary artery without angina pectoris: Secondary | ICD-10-CM

## 2021-02-16 MED ORDER — ICOSAPENT ETHYL 1 G PO CAPS: 2.0000 g | ORAL_CAPSULE | Freq: Two times a day (BID) | ORAL | 0 refills | Status: DC

## 2021-03-04 ENCOUNTER — Telehealth: Payer: Self-pay

## 2021-03-04 NOTE — Telephone Encounter (Signed)
Script has been given verbally.

## 2021-03-04 NOTE — Telephone Encounter (Signed)
  LAST APPOINTMENT DATE: 01/08/2021  NEXT APPOINTMENT DATE:@8 /02/2021  MEDICATION:metFORMIN (GLUCOPHAGE) 500 MG tablet  [facility] [address]  Comments: needing a verbal authorization    Please advise

## 2021-03-10 ENCOUNTER — Ambulatory Visit (HOSPITAL_COMMUNITY): Payer: Medicare Other

## 2021-03-12 ENCOUNTER — Other Ambulatory Visit: Payer: Self-pay | Admitting: Neurology

## 2021-03-13 LAB — CK: Total CK: 529 U/L (ref 41–331)

## 2021-04-02 ENCOUNTER — Ambulatory Visit (INDEPENDENT_AMBULATORY_CARE_PROVIDER_SITE_OTHER): Payer: Medicare Other

## 2021-04-02 DIAGNOSIS — Z0000 Encounter for general adult medical examination without abnormal findings: Secondary | ICD-10-CM

## 2021-04-02 NOTE — Patient Instructions (Signed)
Stanley Boyd , Thank you for taking time to come for your Medicare Wellness Visit. I appreciate your ongoing commitment to your health goals. Please review the following plan we discussed and let me know if I can assist you in the future.   Screening recommendations/referrals: Colonoscopy: Done 01/25/20 repeat 01/24/25 Recommended yearly ophthalmology/optometry visit for glaucoma screening and checkup Recommended yearly dental visit for hygiene and checkup  Vaccinations: Influenza vaccine: Due 05/11/21 Pneumococcal vaccine: Due 12/03/21 Tdap vaccine: Due and discussed Shingles vaccine: Shingrix discussed. Please contact your pharmacy for coverage information.    Covid-19: Completed 1/27, & 2/27 discontinued   Advanced directives: Please bring a copy of your health care power of attorney and living will to the office at your convenience.  Conditions/risks identified: Make it to 72 th anniversary   Next appointment: Follow up in one year for your annual wellness visit.   Preventive Care 61 Years and Older, Male Preventive care refers to lifestyle choices and visits with your health care provider that can promote health and wellness. What does preventive care include? A yearly physical exam. This is also called an annual well check. Dental exams once or twice a year. Routine eye exams. Ask your health care provider how often you should have your eyes checked. Personal lifestyle choices, including: Daily care of your teeth and gums. Regular physical activity. Eating a healthy diet. Avoiding tobacco and drug use. Limiting alcohol use. Practicing safe sex. Taking low doses of aspirin every day. Taking vitamin and mineral supplements as recommended by your health care provider. What happens during an annual well check? The services and screenings done by your health care provider during your annual well check will depend on your age, overall health, lifestyle risk factors, and family history of  disease. Counseling  Your health care provider may ask you questions about your: Alcohol use. Tobacco use. Drug use. Emotional well-being. Home and relationship well-being. Sexual activity. Eating habits. History of falls. Memory and ability to understand (cognition). Work and work Statistician. Screening  You may have the following tests or measurements: Height, weight, and BMI. Blood pressure. Lipid and cholesterol levels. These may be checked every 5 years, or more frequently if you are over 22 years old. Skin check. Lung cancer screening. You may have this screening every year starting at age 55 if you have a 30-pack-year history of smoking and currently smoke or have quit within the past 15 years. Fecal occult blood test (FOBT) of the stool. You may have this test every year starting at age 75. Flexible sigmoidoscopy or colonoscopy. You may have a sigmoidoscopy every 5 years or a colonoscopy every 10 years starting at age 33. Prostate cancer screening. Recommendations will vary depending on your family history and other risks. Hepatitis C blood test. Hepatitis B blood test. Sexually transmitted disease (STD) testing. Diabetes screening. This is done by checking your blood sugar (glucose) after you have not eaten for a while (fasting). You may have this done every 1-3 years. Abdominal aortic aneurysm (AAA) screening. You may need this if you are a current or former smoker. Osteoporosis. You may be screened starting at age 23 if you are at high risk. Talk with your health care provider about your test results, treatment options, and if necessary, the need for more tests. Vaccines  Your health care provider may recommend certain vaccines, such as: Influenza vaccine. This is recommended every year. Tetanus, diphtheria, and acellular pertussis (Tdap, Td) vaccine. You may need a Td booster every  10 years. Zoster vaccine. You may need this after age 69. Pneumococcal 13-valent  conjugate (PCV13) vaccine. One dose is recommended after age 60. Pneumococcal polysaccharide (PPSV23) vaccine. One dose is recommended after age 32. Talk to your health care provider about which screenings and vaccines you need and how often you need them. This information is not intended to replace advice given to you by your health care provider. Make sure you discuss any questions you have with your health care provider. Document Released: 10/24/2015 Document Revised: 06/16/2016 Document Reviewed: 07/29/2015 Elsevier Interactive Patient Education  2017 [place] Prevention in the Home Falls can cause injuries. They can happen to people of all ages. There are many things you can do to make your home safe and to help prevent falls. What can I do on the outside of my home? Regularly fix the edges of walkways and driveways and fix any cracks. Remove anything that might make you trip as you walk through a door, such as a raised step or threshold. Trim any bushes or trees on the path to your home. Use bright outdoor lighting. Clear any walking paths of anything that might make someone trip, such as rocks or tools. Regularly check to see if handrails are loose or broken. Make sure that both sides of any steps have handrails. Any raised decks and porches should have guardrails on the edges. Have any leaves, snow, or ice cleared regularly. Use sand or salt on walking paths during winter. Clean up any spills in your garage right away. This includes oil or grease spills. What can I do in the bathroom? Use night lights. Install grab bars by the toilet and in the tub and shower. Do not use towel bars as grab bars. Use non-skid mats or decals in the tub or shower. If you need to sit down in the shower, use a plastic, non-slip stool. Keep the floor dry. Clean up any water that spills on the floor as soon as it happens. Remove soap buildup in the tub or shower regularly. Attach bath mats  securely with double-sided non-slip rug tape. Do not have throw rugs and other things on the floor that can make you trip. What can I do in the bedroom? Use night lights. Make sure that you have a light by your bed that is easy to reach. Do not use any sheets or blankets that are too big for your bed. They should not hang down onto the floor. Have a firm chair that has side arms. You can use this for support while you get dressed. Do not have throw rugs and other things on the floor that can make you trip. What can I do in the kitchen? Clean up any spills right away. Avoid walking on wet floors. Keep items that you use a lot in easy-to-reach places. If you need to reach something above you, use a strong step stool that has a grab bar. Keep electrical cords out of the way. Do not use floor polish or wax that makes floors slippery. If you must use wax, use non-skid floor wax. Do not have throw rugs and other things on the floor that can make you trip. What can I do with my stairs? Do not leave any items on the stairs. Make sure that there are handrails on both sides of the stairs and use them. Fix handrails that are broken or loose. Make sure that handrails are as long as the stairways. Check any carpeting to make  sure that it is firmly attached to the stairs. Fix any carpet that is loose or worn. Avoid having throw rugs at the top or bottom of the stairs. If you do have throw rugs, attach them to the floor with carpet tape. Make sure that you have a light switch at the top of the stairs and the bottom of the stairs. If you do not have them, ask someone to add them for you. What else can I do to help prevent falls? Wear shoes that: Do not have high heels. Have rubber bottoms. Are comfortable and fit you well. Are closed at the toe. Do not wear sandals. If you use a stepladder: Make sure that it is fully opened. Do not climb a closed stepladder. Make sure that both sides of the stepladder  are locked into place. Ask someone to hold it for you, if possible. Clearly mark and make sure that you can see: Any grab bars or handrails. First and last steps. Where the edge of each step is. Use tools that help you move around (mobility aids) if they are needed. These include: Canes. Walkers. Scooters. Crutches. Turn on the lights when you go into a dark area. Replace any light bulbs as soon as they burn out. Set up your furniture so you have a clear path. Avoid moving your furniture around. If any of your floors are uneven, fix them. If there are any pets around you, be aware of where they are. Review your medicines with your doctor. Some medicines can make you feel dizzy. This can increase your chance of falling. Ask your doctor what other things that you can do to help prevent falls. This information is not intended to replace advice given to you by your health care provider. Make sure you discuss any questions you have with your health care provider. Document Released: 07/24/2009 Document Revised: 03/04/2016 Document Reviewed: 11/01/2014 Elsevier Interactive Patient Education  2017 [employer].
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
## 2021-04-15 ENCOUNTER — Other Ambulatory Visit: Payer: Self-pay | Admitting: Neurology

## 2021-04-16 LAB — CREATININE KINASE MB: CK-MB Index: 23.6 ng/mL (ref 0.0–10.4)

## 2021-04-21 LAB — SPECIMEN STATUS REPORT

## 2021-04-21 LAB — CK: Total CK: 896 U/L (ref 41–331)

## 2021-04-22 ENCOUNTER — Other Ambulatory Visit: Payer: Self-pay

## 2021-04-22 DIAGNOSIS — I2510 Atherosclerotic heart disease of native coronary artery without angina pectoris: Secondary | ICD-10-CM

## 2021-04-23 ENCOUNTER — Ambulatory Visit: Payer: Medicare Other | Admitting: Neurology

## 2021-04-23 ENCOUNTER — Encounter: Payer: Self-pay | Admitting: Neurology

## 2021-04-23 ENCOUNTER — Other Ambulatory Visit: Payer: Self-pay

## 2021-04-23 VITALS — BP 133/74 | HR 64 | Ht 69.0 in | Wt 196.6 lb

## 2021-04-23 DIAGNOSIS — G7249 Other inflammatory and immune myopathies, not elsewhere classified: Secondary | ICD-10-CM | POA: Diagnosis not present

## 2021-04-23 NOTE — Progress Notes (Signed)
Follow-up Visit   Date: 04/23/21   Stanley Boyd MRN: [id number] DOB: [birth year]   Interim History: Stanley Boyd is a 70 y.o. 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  The patient was accompanied to the clinic by wife who also provides collateral information.    He has been doing well since his last visit.  CK has been fluctuation from 500-800s.  Fortunately, he denies any new limb weakness.  He remains very active and able to do his usual activities, such as mowing, playing golf, etc.  He took a one week vacation with his family to [place] and did well on the trip.  His vision remains reduced and unchanged.    Medications:  Current Outpatient Medications on File Prior to Visit  Medication Sig Dispense Refill   Alirocumab (PRALUENT) 150 MG/ML SOAJ INJECT CONTENTS OF ONE PEN SUB-Q EVERY 2 WEEKS 2 mL 11   aspirin 81 MG chewable tablet Chew 1 tablet (81 mg total) by mouth at bedtime.     brimonidine (ALPHAGAN) 0.2 % ophthalmic solution INSTILL 1 DROP INTO RIGHT EYE THREE TIMES DAILY     calcium-vitamin D (OSCAL WITH D) 500-200 MG-UNIT TABS tablet Take 1 tablet by mouth 3 (three) times daily. 270 tablet 3   Carboxymethylcellulose Sodium (REFRESH TEARS OP) Place 1 drop into both eyes daily as needed (dry eyes).      colchicine 0.6 MG tablet Take 1 tablet (0.6 mg total) by mouth daily as needed (psuedogout pain). 30 tablet 2   ezetimibe (ZETIA) 10 MG tablet Take 1 tablet (10 mg total) by mouth daily. 30 tablet 3   finasteride (PROSCAR) 5 MG tablet Take 5 mg by mouth at bedtime.      fluticasone (FLONASE) 50 MCG/ACT nasal spray Place 2 sprays into both nostrils daily as needed for allergies or rhinitis.     icosapent Ethyl  (VASCEPA) 1 g capsule Take 2 capsules (2 g total) by mouth 2 (two) times daily. 120 capsule 0   metFORMIN (GLUCOPHAGE) 500 MG tablet Take 1-2 tablets (500-1,000 mg total) by mouth 2 (two) times daily with a meal. Take 1000mg  in morning with breakfast and 500mg  with dinner (Patient taking differently: Take 500 mg by mouth 2 (two) times daily with a meal. Two Tablets twice a day) 270 tablet 3   mycophenolate (CELLCEPT) 500 MG tablet Take 2 tablets (1,000 mg total) by mouth 2 (two) times daily. 360 tablet 3   nitroGLYCERIN (NITROSTAT) 0.4 MG SL tablet Place 1 tablet (0.4 mg total) under the tongue every 5 (five) minutes as needed for chest pain. 25 tablet 4   pantoprazole (PROTONIX) 20 MG tablet Take 1 tablet by mouth once daily 90 tablet 3   predniSONE (DELTASONE) 10 MG tablet Take 2 tablets (20 mg total) by mouth daily with breakfast. 180 tablet 3   sildenafil (VIAGRA) 100 MG tablet Take 1 tablet (100 mg total) by mouth daily as needed for erectile dysfunction. 10 tablet 5   Syringe/Needle, Disp, (SYRINGE 3CC/22GX1") 22G X 1" 3 ML MISC Use with B12 injections 10 each 0   tamsulosin (FLOMAX) 0.4 MG CAPS capsule Take 0.4 mg by mouth at bedtime.     vitamin B-12 (CYANOCOBALAMIN) 1000 MCG tablet Take 1,000 mcg by mouth daily.     Immune  Globulin 10% (PRIVIGEN) 10G/126mL (10,000mg /18mL) SOLN Inject 1 g/kg into the vein See admin instructions. 75 g every 21 days for 1 consecutive days (Patient not taking: No sig reported)     No current facility-administered medications on file prior to visit.    Allergies:  Allergies  Allergen Reactions   Covid-19 Mrna Vacc (Moderna) Other (See Comments)    Optic neuropathy after original 2 vaccines    Statins Other (See Comments)    Myositis    Vital Signs:  BP 133/74 (BP Location: Left Arm, Patient Position: Sitting, Cuff Size: Normal)   Pulse 64   Ht 5\' 9"  (1.753 m)   Wt 196 lb 9.6 oz (89.2 kg)   SpO2 99%   BMI 29.03 kg/m   Neurological Exam: MENTAL  STATUS including orientation to time, place, person, recent and remote memory, attention span and concentration, language, and fund of knowledge is normal.  Speech is not dysarthric.  CRANIAL NERVES: Normal conjugate, extra-ocular eye movements in all directions of gaze.  Mild right ptosis at rest, no worsening with sustained upgaze.   MOTOR:  Motor strength is 5/5 proximally, R hand is 66/5 (old from stroke). No atrophy, fasciculations or abnormal movements.  No pronator drift.   COORDINATION/GAIT:   Gait narrow based and stable.   Data: MRI cervical spine without contrast 02/17/2019: 1. 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 2. Solid anterior fusion at C4-5 and C5-6 without spinal stenosis at these levels. 3. Adjacent segment degenerative changes with moderate spinal stenosis at C6-7, mild-to-moderate spinal stenosis at C3-4, and mild-to-moderate neural foraminal stenosis at both levels.   MRI lumbar spine 02/17/2019: 1. Relatively diffuse lumbar disc and facet degeneration without significant spinal stenosis. 2. Multilevel neural foraminal stenosis, moderate on the left at L5-S1. 3. Normal appearance of the conus medullaris and cauda equina.   MRI thoracic spine without contrast 02/17/2019: 1. Mild thoracic disc and facet degeneration without evidence of neural impingement. 2. Minimal prominence of the central canal of the mid to lower thoracic spinal cord without syrinx.  Left rectus femorus muscle biopsy 02/20/2019:  Active necrotizing inflammatory myopathy. 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  Initially treated with long course of IVIG and prednisone, followed by Cellcept.  His IVIG was tapered and discontinued in April 2022.  He remains on prednisone 20mg /d and Cellcept was increased in February 2022 to 1000mg  BID.  His exam continues to show full strength, despite CK fluctuating.  It's possible his recent elevation was due to being more active prior to lab draw.  - Continue prednisone 20mg /d  - Continue Cellcept 1000mg  BID  - Monitor CK every month  2.  Right ptosis, fluctuating. AChR negative.  3.  Bilateral optic neuropathy, stable.  Previously evaluated by [facility]  Return to clinic in 3 months   Thank you for allowing me to participate in patient's care.  If I can answer any additional questions, I would be pleased to do so.    Sincerely,    Khushi Zupko K. Posey Pronto, DO

## 2021-04-23 NOTE — Patient Instructions (Signed)
Check CK every month  Continue your medication as you are taking it  Return to clinic 3 month

## 2021-05-04 ENCOUNTER — Telehealth: Payer: Self-pay

## 2021-05-04 ENCOUNTER — Encounter: Payer: Self-pay | Admitting: Family Medicine

## 2021-05-04 NOTE — Telephone Encounter (Signed)
Patient has appointment on 8/5 w/ Dr. Yong Channel.    Spouse wanted to know if patient needed labs completed before appointment.  Spouse is concerned about A1C.  If patient needs labs, spouse is requesting labs to be sent to [facility] in [place] on [address].    Please follow up in regard.

## 2021-05-05 NOTE — Telephone Encounter (Signed)
Dr. Hunter, please see message and advise. 

## 2021-05-05 NOTE — Telephone Encounter (Signed)
See mychart message on this same subject- I responded on that thread

## 2021-05-05 NOTE — Telephone Encounter (Signed)
His appointment is only a follow up on 8/5. Please advise

## 2021-05-06 NOTE — Telephone Encounter (Signed)
Please see Dr. Ansel Bong message.

## 2021-05-06 NOTE — Telephone Encounter (Signed)
Noted  

## 2021-05-07 ENCOUNTER — Other Ambulatory Visit: Payer: Self-pay

## 2021-05-07 DIAGNOSIS — T380X5A Adverse effect of glucocorticoids and synthetic analogues, initial encounter: Secondary | ICD-10-CM

## 2021-05-07 DIAGNOSIS — R739 Hyperglycemia, unspecified: Secondary | ICD-10-CM

## 2021-05-07 DIAGNOSIS — E785 Hyperlipidemia, unspecified: Secondary | ICD-10-CM

## 2021-05-08 ENCOUNTER — Other Ambulatory Visit: Payer: Self-pay | Admitting: Family Medicine

## 2021-05-09 LAB — CBC WITH DIFFERENTIAL/PLATELET
Basophils Absolute: 0.1 10*3/uL (ref 0.0–0.2)
Basos: 1 %
EOS (ABSOLUTE): 0.1 10*3/uL (ref 0.0–0.4)
Eos: 1 %
Hematocrit: 45.1 % (ref 37.5–51.0)
Hemoglobin: 14.7 g/dL (ref 13.0–17.7)
Immature Grans (Abs): 0.1 10*3/uL (ref 0.0–0.1)
Immature Granulocytes: 1 %
Lymphocytes Absolute: 3.2 10*3/uL — ABNORMAL HIGH (ref 0.7–3.1)
Lymphs: 38 %
MCH: 29.9 pg (ref 26.6–33.0)
MCHC: 32.6 g/dL (ref 31.5–35.7)
MCV: 92 fL (ref 79–97)
Monocytes Absolute: 0.6 10*3/uL (ref 0.1–0.9)
Monocytes: 8 %
Neutrophils Absolute: 4.4 10*3/uL (ref 1.4–7.0)
Neutrophils: 51 %
Platelets: 239 10*3/uL (ref 150–450)
RBC: 4.92 x10E6/uL (ref 4.14–5.80)
RDW: 13.3 % (ref 11.6–15.4)
WBC: 8.4 10*3/uL (ref 3.4–10.8)

## 2021-05-09 LAB — COMPREHENSIVE METABOLIC PANEL
ALT: 54 IU/L — ABNORMAL HIGH (ref 0–44)
AST: 45 IU/L — ABNORMAL HIGH (ref 0–40)
Albumin/Globulin Ratio: 2.1 (ref 1.2–2.2)
Albumin: 4.6 g/dL (ref 3.8–4.8)
Alkaline Phosphatase: 44 IU/L (ref 44–121)
BUN/Creatinine Ratio: 16 (ref 10–24)
BUN: 16 mg/dL (ref 8–27)
Bilirubin Total: 0.3 mg/dL (ref 0.0–1.2)
CO2: 26 mmol/L (ref 20–29)
Calcium: 10.7 mg/dL — ABNORMAL HIGH (ref 8.6–10.2)
Chloride: 100 mmol/L (ref 96–106)
Creatinine, Ser: 0.97 mg/dL (ref 0.76–1.27)
Globulin, Total: 2.2 g/dL (ref 1.5–4.5)
Glucose: 95 mg/dL (ref 65–99)
Potassium: 4.8 mmol/L (ref 3.5–5.2)
Sodium: 144 mmol/L (ref 134–144)
Total Protein: 6.8 g/dL (ref 6.0–8.5)
eGFR: 85 mL/min/{1.73_m2} (ref >59)

## 2021-05-09 LAB — HEMOGLOBIN A1C
Est. average glucose Bld gHb Est-mCnc: 151 mg/dL
Hgb A1c MFr Bld: 6.9 % — ABNORMAL HIGH (ref 4.8–5.6)

## 2021-05-09 LAB — SPECIMEN STATUS REPORT

## 2021-05-09 LAB — LIPID PANEL
Chol/HDL Ratio: 2.3 ratio (ref 0.0–5.0)
Cholesterol, Total: 133 mg/dL (ref 100–199)
HDL: 57 mg/dL (ref >39)
LDL Chol Calc (NIH): 49 mg/dL (ref 0–99)
Triglycerides: 164 mg/dL — ABNORMAL HIGH (ref 0–149)
VLDL Cholesterol Cal: 27 mg/dL (ref 5–40)

## 2021-05-15 ENCOUNTER — Encounter: Payer: Self-pay | Admitting: Family Medicine

## 2021-05-15 ENCOUNTER — Ambulatory Visit: Payer: Medicare Other | Admitting: Family Medicine

## 2021-05-15 ENCOUNTER — Other Ambulatory Visit: Payer: Self-pay

## 2021-05-15 VITALS — BP 110/65 | HR 61 | Temp 98.4°F | Ht 69.0 in | Wt 194.8 lb

## 2021-05-15 DIAGNOSIS — E785 Hyperlipidemia, unspecified: Secondary | ICD-10-CM

## 2021-05-15 DIAGNOSIS — R7989 Other specified abnormal findings of blood chemistry: Secondary | ICD-10-CM

## 2021-05-15 DIAGNOSIS — G7249 Other inflammatory and immune myopathies, not elsewhere classified: Secondary | ICD-10-CM

## 2021-05-15 DIAGNOSIS — N529 Male erectile dysfunction, unspecified: Secondary | ICD-10-CM

## 2021-05-15 DIAGNOSIS — E538 Deficiency of other specified B group vitamins: Secondary | ICD-10-CM | POA: Diagnosis not present

## 2021-05-15 DIAGNOSIS — R739 Hyperglycemia, unspecified: Secondary | ICD-10-CM

## 2021-05-15 DIAGNOSIS — H469 Unspecified optic neuritis: Secondary | ICD-10-CM

## 2021-05-15 DIAGNOSIS — T380X5A Adverse effect of glucocorticoids and synthetic analogues, initial encounter: Secondary | ICD-10-CM

## 2021-05-15 DIAGNOSIS — I25118 Atherosclerotic heart disease of native coronary artery with other forms of angina pectoris: Secondary | ICD-10-CM

## 2021-05-15 NOTE — Progress Notes (Signed)
Phone [phone] In person visit   Subjective:   Stanley Boyd is a 70 y.o. year old very pleasant male patient who presents for/with See problem oriented charting Chief Complaint  Patient presents with   Follow-up    Blood results     This visit occurred during the SARS-CoV-2 public health emergency.  Safety protocols were in place, including screening questions prior to the visit, additional usage of staff PPE, and extensive cleaning of exam room while observing appropriate contact time as indicated for disinfecting solutions.   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    Priority: High   CAD (coronary artery disease), native coronary artery 07/13/2015    Priority: High   S/P PTCA (percutaneous transluminal coronary angioplasty) 07/08/2015    Priority: High   Elevated PSA 02/25/2017    Priority: Medium   Hemiparesis ([place]) 09/18/2014    Priority: Medium   BPH (benign prostatic hyperplasia) 09/18/2014    Priority: Medium   Hyperlipidemia     Priority: Medium   Bradycardia     Priority: Medium   Polyp of sigmoid colon     Priority: Low   B12 deficiency 02/26/2019    Priority: Low   History of colonic polyps 10/15/2014    Priority: Low   Transaminitis 09/18/2014    Priority: Low   Ptosis of eyelid, right 10/02/2020   Other optic atrophy, 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      brimonidine (ALPHAGAN) 0.2 % ophthalmic solution INSTILL 1 DROP INTO RIGHT EYE THREE TIMES DAILY     calcium-vitamin D (OSCAL WITH D) 500-200 MG-UNIT TABS tablet Take 1 tablet by mouth 3 (three) times daily. 270 tablet 3   Carboxymethylcellulose Sodium (REFRESH TEARS OP) Place 1 drop into both eyes daily as needed (dry eyes).      colchicine 0.6 MG tablet Take 1 tablet (0.6 mg total) by mouth daily as needed (psuedogout pain). 30 tablet 2   ezetimibe (ZETIA) 10 MG tablet Take 1 tablet (10 mg total) by mouth daily. 30 tablet 3   finasteride (PROSCAR) 5 MG tablet Take 5 mg by mouth at bedtime.      fluticasone (FLONASE) 50 MCG/ACT nasal spray Place 2 sprays into both nostrils daily as needed for allergies or rhinitis.     icosapent Ethyl (VASCEPA) 1 g capsule Take 2 capsules (2 g total) by mouth 2 (two) times daily. 120 capsule 0   Immune Globulin 10% (PRIVIGEN) 10G/121m (10,'000mg'$ /1060m SOLN Inject 1 g/kg into the vein See admin instructions. 75 g every 21 days for 1 consecutive days     metFORMIN (GLUCOPHAGE) 500 MG tablet Take 1-2 tablets (500-1,000 mg total) by mouth 2 (two) times daily with a meal. Take '1000mg'$  in morning with breakfast and '500mg'$  with dinner (Patient taking differently: Take 500 mg by mouth 2 (two) times daily with a meal. Two Tablets twice a day)  270 tablet 3   mycophenolate (CELLCEPT) 500 MG tablet Take 2 tablets (1,000 mg total) by mouth 2 (two) times daily. 360 tablet 3   nitroGLYCERIN (NITROSTAT) 0.4 MG SL tablet Place 1 tablet (0.4 mg total) under the tongue every 5 (five) minutes as needed for chest pain. 25 tablet 4   pantoprazole (PROTONIX) 20 MG tablet Take 1 tablet by mouth once daily 90 tablet 3   predniSONE (DELTASONE) 10 MG tablet Take 2 tablets (20 mg total) by mouth daily with breakfast. 180 tablet 3   sildenafil (VIAGRA) 100 MG tablet Take 1 tablet (100 mg total) by mouth daily as needed for erectile dysfunction. 10 tablet 5   Syringe/Needle, Disp, (SYRINGE  3CC/22GX1") 22G X 1" 3 ML MISC Use with B12 injections 10 each 0   tamsulosin (FLOMAX) 0.4 MG CAPS capsule Take 0.4 mg by mouth at bedtime.     vitamin B-12 (CYANOCOBALAMIN) 1000 MCG tablet Take 1,000 mcg by mouth daily.     No current facility-administered medications for this visit.     Objective:  BP 110/65   Pulse 61   Temp 98.4 F (36.9 C) (Temporal)   Ht '5\' 9"'$  (1.753 m)   Wt 194 lb 12.8 oz (88.4 kg)   SpO2 96%   BMI 28.77 kg/m  Gen: NAD, resting comfortably CV: RRR no murmurs rubs or gallops Lungs: CTAB no crackles, wheeze, rhonchi Ext: minimal edema Skin: warm, dry     Assessment and Plan   #social update- [place]- flew into [place] and out of [place] had a great time. 7 days. Very busy. With oldest son.  - [place] next [place]  #vision concerns- patient having issues with vision due to right drooping eye lid- unfortunately trouble with insurance getting this covered  #Autoimmune necrotizing myopathy S: Patient remained on CellCept (increased)and prednisone. Weaned from  IVIG.  A/P:  CK checks on monthly basis with Dr. Posey Pronto and communicating based on results- hopefully stable off IVIG on next check. Slight increase recently- was really active right before.   #Optic neuropathy bilateral eyes-originally cared for by [facility] and later transitioned to [facility] ophthalmology.  Required high-dose steroids-thought potentially related to moderna vaccination-had severe symptoms around time of vaccination. Avoiding covid vaccine as a result of prior issues.    #hypercalcemia- taking twice a day 500-200 mg with calcium and vitamin D- with vitamin D level slgihtly high opted to take one week off then resume once a day (also loves milk)- may need to go to once a day if high again next visit- could be lab error as well  #Steroid-induced hyperglycemia S: Medication: metformin '500mg'$  twice daily now up to '1000mg'$  twice a day after a1c up in  early march (had to go back on prednisone and affected sugars) Exercise and diet- has been very active. Trying to do reasonably healthy diet Lab Results  Component Value Date   HGBA1C 6.9 (H) 05/08/2021   HGBA1C 7.6 (H) 12/11/2020   HGBA1C 6.7 (H) 08/07/2020   A/P:glad this has improved- continue current meds for now. May need to readjust if reduces prednisone in future   #CAD #hyperlipidemia S: Medication: Aspirin 81 mg, Vascepa, Praluent 150 mg, one pen sub-q every 2 weeks, and  Zetia 10 mg daily  No chest pain or shortness of breath. August 25 cardiology visit.  Lab Results  Component Value Date   CHOL 133 05/08/2021   HDL 57 05/08/2021   LDLCALC 49 05/08/2021  LDLDIRECT 58 02/26/2020   TRIG 164 (H) 05/08/2021   CHOLHDL 2.3 05/08/2021   A/P: CAD asymptomatic- continue current meds. HLD- well controlled- continue current meds  #LFT - mild elevations no recent worsening- continue current meds as long as aslt/ast under 100 Lab Results  Component Value Date   ALT 54 (H) 05/08/2021   AST 45 (H) 05/08/2021   ALKPHOS 44 05/08/2021   BILITOT 0.3 05/08/2021    # B12 deficiency S: Current treatment/medication (oral vs. IM): cyanocobalamin 1012mg orally daily -Metformin and PPI likely contributed Lab Results  Component Value Date   VITAMINB12 610 08/07/2020  A/P: doing well on last check- consider with next labs   #elevated tsh- very mild- update with labs today Lab Results  Component Value Date   TSH 1.74 01/08/2021   #ED- worse with finasteride (psa trending down on this). Asked about trying viagra and thankfully has done well on this - no chest pain or shortness of breath but effective   Recommended follow up: Return in about 6 months (around 11/15/2021) for follow up- or sooner if needed. Future Appointments  Date Time Provider [place] 06/05/2021 11:00 AM GAdrian Prows MD [facility] None  11/20/2021 11:20 AM HMarin Olp MD [facility]  04/08/2022  1:45 PM  [facility] HEALTH COACH [facility]   Lab/Order associations:   ICD-10-CM   1. Autoimmune necrotizing myopathy  G72.49     2. Hyperlipidemia, unspecified hyperlipidemia type  E78.5 CBC with Differential/Platelet    Comprehensive metabolic panel    3. B12 deficiency  E53.8 Vitamin B12    4. Optic neuropathy  H46.9     5. Steroid-induced hyperglycemia  R73.9 Hemoglobin A1c   T38.0X5A     6. Coronary artery disease involving native coronary artery of native heart with other form of angina pectoris ([place]  I25.118     7. LFT elevation  R79.89     8. Erectile dysfunction, unspecified erectile dysfunction type  N52.9       No orders of the defined types were placed in this encounter.   I,Jada Bradford,acting as a scribe for SGarret Reddish MD.,have documented all relevant documentation on the behalf of SGarret Reddish MD,as directed by  SGarret Reddish MD while in the presence of SGarret Reddish MD.  I, SGarret Reddish MD, have reviewed all documentation for this visit. The documentation on 05/15/21 for the exam, diagnosis, procedures, and orders are all accurate and complete.  Return precautions advised.  SGarret Reddish MD

## 2021-05-15 NOTE — Patient Instructions (Addendum)
Health Maintenance Due  Topic Date Due   Zoster Vaccines- Shingrix (1 of 2)  Holding off for now- harder decision with your history of high fever on moderna- I do see people with fever/chills/body aches on this a fair amount Never done   TETANUS/TDAP - with how active you are reasonable to get Td only 08/29/2019   INFLUENZA VACCINE - consider in the fall 05/11/2021   One week off calcium- vitamin D then restart just once a day with slightly high calcium- if still high next check can readjust.   No changes today best of luck getting down to lower doses of prednisone and getting eyelid fixed  Recommended follow up: Return in about 6 months (around 11/15/2021) for follow up- or sooner if needed. Do labs we printed at least a week before at [place]

## 2021-05-21 ENCOUNTER — Other Ambulatory Visit: Payer: Self-pay | Admitting: Neurology

## 2021-05-22 LAB — CK: Total CK: 632 U/L (ref 41–331)

## 2021-06-05 ENCOUNTER — Ambulatory Visit: Payer: Medicare Other | Admitting: Cardiology

## 2021-06-05 ENCOUNTER — Encounter: Payer: Self-pay | Admitting: Cardiology

## 2021-06-05 ENCOUNTER — Other Ambulatory Visit: Payer: Self-pay

## 2021-06-05 VITALS — BP 107/69 | HR 71 | Temp 97.7°F | Resp 17 | Ht 69.0 in | Wt 190.6 lb

## 2021-06-05 DIAGNOSIS — E7800 Pure hypercholesterolemia, unspecified: Secondary | ICD-10-CM

## 2021-06-05 DIAGNOSIS — R7989 Other specified abnormal findings of blood chemistry: Secondary | ICD-10-CM

## 2021-06-05 DIAGNOSIS — I2510 Atherosclerotic heart disease of native coronary artery without angina pectoris: Secondary | ICD-10-CM

## 2021-06-05 DIAGNOSIS — G7249 Other inflammatory and immune myopathies, not elsewhere classified: Secondary | ICD-10-CM
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
## 2021-06-12 ENCOUNTER — Ambulatory Visit: Payer: Medicare Other | Admitting: Cardiology

## 2021-06-18 ENCOUNTER — Other Ambulatory Visit: Payer: Self-pay | Admitting: Cardiology

## 2021-06-18 DIAGNOSIS — I2510 Atherosclerotic heart disease of native coronary artery without angina pectoris: Secondary | ICD-10-CM

## 2021-06-19 ENCOUNTER — Other Ambulatory Visit: Payer: Self-pay | Admitting: Neurology

## 2021-06-20 LAB — CK: Total CK: 785 U/L (ref 41–331)

## 2021-07-09 ENCOUNTER — Other Ambulatory Visit: Payer: Self-pay

## 2021-07-09 ENCOUNTER — Ambulatory Visit: Payer: Medicare Other | Admitting: Family Medicine

## 2021-07-09 VITALS — BP 160/88 | HR 75 | Ht 69.0 in | Wt 197.4 lb

## 2021-07-09 DIAGNOSIS — M62838 Other muscle spasm: Secondary | ICD-10-CM | POA: Diagnosis not present

## 2021-07-09 MED ORDER — COLCHICINE 0.6 MG PO TABS: 0.6000 mg | ORAL_TABLET | Freq: Every day | ORAL | 2 refills | Status: DC | PRN

## 2021-07-09 MED ORDER — TIZANIDINE HCL 4 MG PO TABS: 4.0000 mg | ORAL_TABLET | Freq: Four times a day (QID) | ORAL | 1 refills | Status: DC | PRN

## 2021-07-09 MED ORDER — HYDROCODONE-ACETAMINOPHEN 5-325 MG PO TABS: 1.0000 | ORAL_TABLET | Freq: Four times a day (QID) | ORAL | 0 refills | Status: DC | PRN

## 2021-07-09 NOTE — Progress Notes (Signed)
I, Peterson Lombard, LAT, ATC acting as a scribe for [name], MD.  Stanley Boyd is a 70 y.o. male who presents to [place] at [facility] today c/o neck pain. Pt was previously seen by Dr. Georgina Snell on 08/14/20 for R knee pain. Today, pt reports neck pain ongoing since 9/27 while [place] the beach [place] up in the morning after watching bed the night before. Pt locates pain to the L side of his neck and describes pain as a strong "pulsing" pain. Pt notes he has a hx of 4 back surgeries.  Radiates: no UE Numbness/tingling: no UE Weakness: no Aggravates: everything Treatments tried: patch, tylenol  Dx imaging: 02/17/19 C-spine MRI   Pertinent review of systems: No fevers or chills  Relevant historical information: History of optic neuritis   Exam:  BP (!) 160/88   Pulse 75   Ht 5\' 9"  (1.753 m)   Wt 197 lb 6.4 oz (89.5 kg)   SpO2 97%   BMI 29.15 kg/m  General: Well Developed, well nourished, and in no acute distress.   MSK: C-spine nontender midline.  Tender palpation left cervical paraspinal musculature at the superior portion of the C-spine.  Decreased cervical motion.  Upper extremity strength is stable    Lab and Radiology Results  EXAM: MRI CERVICAL SPINE WITHOUT CONTRAST   TECHNIQUE: Multiplanar, multisequence MR imaging of the cervical spine was performed. No intravenous contrast was administered.   COMPARISON:  None.   FINDINGS: Alignment: Minimal retrolisthesis of C3 on C4.   Vertebrae: C4-5 and C5-6 interbody osseous fusion with anterior fusion plate and screws in place at C4-5. No fracture, suspicious osseous lesion, or significant marrow edema. Chronic degenerative endplate changes at Y7-8 greater than C3-4 associated with disc space narrowing.   Cord: Well-defined T2 hyperintensity in the cord bilaterally at C4 and C5 with mild cord volume loss more notable at C4 consistent with myelomalacia.   Posterior Fossa, vertebral  arteries, paraspinal tissues: Dominant appearing left vertebral artery with preserved flow void. Poor visualization of the right vertebral artery which is likely hypoplastic. Unremarkable included portion of the posterior fossa.   Disc levels:   C2-3: Minimal disc bulging and moderate left facet arthrosis without stenosis.   C3-4: Disc bulging and uncovertebral spurring result in mild-to-moderate spinal stenosis and moderate right and mild-to-moderate left neural foraminal stenosis.   C4-5: Anterior fusion.  No significant stenosis.   C5-6: Anterior fusion. Right-sided osteophytic ridging results in mild right neural foraminal stenosis. Patent spinal canal and left neural foramen.   C6-7: Broad-based posterior disc osteophyte complex results in moderate spinal stenosis and moderate right and mild left neural foraminal stenosis.   C7-T1: Negative.   IMPRESSION: 1. 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 2. Solid anterior fusion at C4-5 and C5-6 without spinal stenosis at these levels. 3. Adjacent segment degenerative changes with moderate spinal stenosis at C6-7, mild-to-moderate spinal stenosis at C3-4, and mild-to-moderate neural foraminal stenosis at both levels.     Electronically Signed   By: Logan Bores M.D.   On: 02/17/2019 12:17 I, [name], personally (independently) visualized and performed the interpretation of the images attached in this note.     Assessment and Plan: 70 y.o. male with C-spine pain starting 2 days ago without injury.  Physical exam and symptoms consistent with cervical paraspinal muscle spasm and dysfunction.  Plan to treat with heating pad, TENS unit, tizanidine, limited hydrocodone, and referral to PT.  We use [facility]  in [place] because they live in that area [place]).  Recheck as needed.  Incidentally colchicine refilled for his history of knee pain.  Doing well. PDMP reviewed during this encounter. Orders Placed This Encounter  Procedures    Ambulatory referral to Physical Therapy    Referral Priority:   Routine    Referral Type:   Physical Medicine    Referral Reason:   Specialty Services Required    Requested Specialty:   Physical Therapy    Number of Visits Requested:   1   Meds ordered this encounter  Medications   tiZANidine (ZANAFLEX) 4 MG tablet    Sig: Take 1 tablet (4 mg total) by mouth every 6 (six) hours as needed for muscle spasms.    Dispense:  30 tablet    Refill:  1   HYDROcodone-acetaminophen (NORCO/VICODIN) 5-325 MG tablet    Sig: Take 1 tablet by mouth every 6 (six) hours as needed.    Dispense:  10 tablet    Refill:  0   colchicine 0.6 MG tablet    Sig: Take 1 tablet (0.6 mg total) by mouth daily as needed (psuedogout pain).    Dispense:  30 tablet    Refill:  2     Discussed warning signs or symptoms. Please see discharge instructions. Patient expresses understanding.   The above documentation has been reviewed and is accurate and complete [name], M.D.

## 2021-07-09 NOTE — Patient Instructions (Signed)
Thank you for coming in today.   I've referred you to Physical Therapy.  Let us know if you don't hear from them in one week.   Heating pad.   TENS unit.   Muscle relaxer.   Use hydrocodone for severe pain.   Let me know if you are not improving.   Cervical Strain and Sprain Rehab Ask your health care provider which exercises are safe for you. Do exercises exactly as told by your health care provider and adjust them as directed. It is normal to feel mild stretching, pulling, tightness, or discomfort as you do these exercises. Stop right away if you feel sudden pain or your pain gets worse. Do not begin these exercises until told by your health care provider. Stretching and range-of-motion exercises Cervical side bending  Using good posture, sit on a stable chair or stand up. Without moving your shoulders, slowly tilt your left / right ear to your shoulder until you feel a stretch in the opposite side neck muscles. You should be looking straight ahead. Hold for __________ seconds. Repeat with the other side of your neck. Repeat __________ times. Complete this exercise __________ times a day. Cervical rotation  Using good posture, sit on a stable chair or stand up. Slowly turn your head to the side as if you are looking over your left / right shoulder. Keep your eyes level with the ground. Stop when you feel a stretch along the side and the back of your neck. Hold for __________ seconds. Repeat this by turning to your other side. Repeat __________ times. Complete this exercise __________ times a day. Thoracic extension and pectoral stretch Roll a towel or a small blanket so it is about 4 inches (10 cm) in diameter. Lie down on your back on a firm surface. Put the towel lengthwise, under your spine in the middle of your back. It should not be under your shoulder blades. The towel should line up with your spine from your middle back to your lower back. Put your hands behind your head  and let your elbows fall out to your sides. Hold for __________ seconds. Repeat __________ times. Complete this exercise __________ times a day. Strengthening exercises Isometric upper cervical flexion Lie on your back with a thin pillow behind your head and a small rolled-up towel under your neck. Gently tuck your chin toward your chest and nod your head down to look toward your feet. Do not lift your head off the pillow. Hold for __________ seconds. Release the tension slowly. Relax your neck muscles completely before you repeat this exercise. Repeat __________ times. Complete this exercise __________ times a day. Isometric cervical extension  Stand about 6 inches (15 cm) away from a wall, with your back facing the wall. Place a soft object, about 6-8 inches (15-20 cm) in diameter, between the back of your head and the wall. A soft object could be a small pillow, a ball, or a folded towel. Gently tilt your head back and press into the soft object. Keep your jaw and forehead relaxed. Hold for __________ seconds. Release the tension slowly. Relax your neck muscles completely before you repeat this exercise. Repeat __________ times. Complete this exercise __________ times a day. Posture and body mechanics Body mechanics refers to the movements and positions of your body while you do your daily activities. Posture is part of body mechanics. Good posture and healthy body mechanics can help to relieve stress in your body's tissues and joints. Good posture means  that your spine is in its natural S-curve position (your spine is neutral), your shoulders are pulled back slightly, and your head is not tipped forward. The following are general guidelines for applying improved posture and body mechanics to your everyday activities. Sitting  When sitting, keep your spine neutral and keep your feet flat on the floor. Use a footrest, if necessary, and keep your thighs parallel to the floor. Avoid rounding  your shoulders, and avoid tilting your head forward. When working at a desk or a computer, keep your desk at a height where your hands are slightly lower than your elbows. Slide your chair under your desk so you are close enough to maintain good posture. When working at a computer, place your monitor at a height where you are looking straight ahead and you do not have to tilt your head forward or downward to look at the screen. Standing  When standing, keep your spine neutral and keep your feet about hip-width apart. Keep a slight bend in your knees. Your ears, shoulders, and hips should line up. When you do a task in which you stand in one place for a long time, place one foot up on a stable object that is 2-4 inches (5-10 cm) high, such as a footstool. This helps keep your spine neutral. Resting When lying down and resting, avoid positions that are most painful for you. Try to support your neck in a neutral position. You can use a contour pillow or a small rolled-up towel. Your pillow should support your neck but not push on it. This information is not intended to replace advice given to you by your health care provider. Make sure you discuss any questions you have with your health care provider. Document Revised: 01/17/2019 Document Reviewed: 06/28/2018 Elsevier Patient Education  [place].

## 2021-07-10 ENCOUNTER — Ambulatory Visit: Payer: Medicare Other | Admitting: Family Medicine

## 2021-07-14 ENCOUNTER — Other Ambulatory Visit: Payer: Self-pay | Admitting: Cardiology

## 2021-07-14 DIAGNOSIS — I2510 Atherosclerotic heart disease of native coronary artery without angina pectoris: Secondary | ICD-10-CM

## 2021-07-24 ENCOUNTER — Other Ambulatory Visit: Payer: Self-pay | Admitting: Neurology

## 2021-07-25 LAB — CK: Total CK: 870 U/L (ref 41–331)

## 2021-08-07 ENCOUNTER — Other Ambulatory Visit: Payer: Self-pay

## 2021-08-07 DIAGNOSIS — G7249 Other inflammatory and immune myopathies, not elsewhere classified: Secondary | ICD-10-CM

## 2021-08-07 NOTE — Progress Notes (Unsigned)
Per Dr.Patel, OK to mail CK (creatine kinase only - do not order CKMB) orders for every month x 3 to patient - they usually have it done at [facility] locally.    Orders printed and mailed off

## 2021-08-10 ENCOUNTER — Ambulatory Visit: Payer: Medicare Other | Admitting: Neurology

## 2021-08-11 ENCOUNTER — Other Ambulatory Visit: Payer: Self-pay | Admitting: Cardiology

## 2021-08-11 DIAGNOSIS — I2510 Atherosclerotic heart disease of native coronary artery without angina pectoris: Secondary | ICD-10-CM

## 2021-08-18 LAB — CK: Total CK: 854 U/L (ref 41–331)

## 2021-08-19 ENCOUNTER — Encounter: Payer: Self-pay | Admitting: Neurology

## 2021-08-19 ENCOUNTER — Ambulatory Visit: Payer: Medicare Other | Admitting: Neurology

## 2021-08-19 ENCOUNTER — Ambulatory Visit (INDEPENDENT_AMBULATORY_CARE_PROVIDER_SITE_OTHER): Payer: Medicare Other

## 2021-08-19 ENCOUNTER — Other Ambulatory Visit: Payer: Self-pay

## 2021-08-19 VITALS — BP 117/72 | HR 54 | Ht 69.0 in | Wt 197.0 lb

## 2021-08-19 DIAGNOSIS — G7249 Other inflammatory and immune myopathies, not elsewhere classified: Secondary | ICD-10-CM | POA: Diagnosis not present

## 2021-08-19 DIAGNOSIS — Z23 Encounter for immunization: Secondary | ICD-10-CM | POA: Diagnosis not present

## 2021-08-19 NOTE — Patient Instructions (Addendum)
It was great to see you today!  Stay on the same medications  Continue to check CK monthly  Return to clinic in 4 months

## 2021-08-19 NOTE — Progress Notes (Signed)
Follow-up Visit   Date: 08/19/21   Stanley Boyd MRN: [id number] DOB: 15-Jun-1951   Interim History: Stanley Boyd is a 70 y.o. 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  The patient was accompanied to the clinic by wife who also provides collateral information.    He has been doing well on prednisone 20mg /d and Cellcept 1000mg  BID.  He denies any weakness.  CK remains elevated ~ 800s. No new complaints today.     Medications:  Current Outpatient Medications on File Prior to Visit  Medication Sig Dispense Refill   Alirocumab (PRALUENT) 150 MG/ML SOAJ INJECT CONTENTS OF ONE PEN SUB-Q EVERY 2 WEEKS 2 mL 11   aspirin 81 MG chewable tablet Chew 1 tablet (81 mg total) by mouth at bedtime.     brimonidine (ALPHAGAN) 0.2 % ophthalmic solution INSTILL 1 DROP INTO RIGHT EYE THREE TIMES DAILY     calcium-vitamin D (OSCAL WITH D) 500-200 MG-UNIT TABS tablet Take 1 tablet by mouth 3 (three) times daily. 270 tablet 3   Carboxymethylcellulose Sodium (REFRESH TEARS OP) Place 1 drop into both eyes daily as needed (dry eyes).      colchicine 0.6 MG tablet Take 1 tablet (0.6 mg total) by mouth daily as needed (psuedogout pain). 30 tablet 2   ezetimibe (ZETIA) 10 MG tablet Take 1 tablet by mouth once daily 30 tablet 0   finasteride (PROSCAR) 5 MG tablet Take 5 mg by mouth at bedtime.      fluticasone (FLONASE) 50 MCG/ACT nasal spray Place 2 sprays into both nostrils daily as needed for allergies or rhinitis.     HYDROcodone-acetaminophen (NORCO/VICODIN) 5-325 MG tablet Take 1 tablet by mouth every 6 (six) hours as needed. 10 tablet 0   icosapent Ethyl (VASCEPA) 1 g capsule Take 2 capsules (2 g total) by mouth 2 (two) times daily. 120 capsule 0   metFORMIN  (GLUCOPHAGE) 500 MG tablet Take 1-2 tablets (500-1,000 mg total) by mouth 2 (two) times daily with a meal. Take 1000mg  in morning with breakfast and 500mg  with dinner (Patient taking differently: Take 500 mg by mouth 2 (two) times daily with a meal. Two Tablets twice a day) 270 tablet 3   mycophenolate (CELLCEPT) 500 MG tablet Take 2 tablets (1,000 mg total) by mouth 2 (two) times daily. 360 tablet 3   nitroGLYCERIN (NITROSTAT) 0.4 MG SL tablet Place 1 tablet (0.4 mg total) under the tongue every 5 (five) minutes as needed for chest pain. 25 tablet 4   pantoprazole (PROTONIX) 20 MG tablet Take 1 tablet by mouth once daily 90 tablet 3   predniSONE (DELTASONE) 10 MG tablet Take 2 tablets (20 mg total) by mouth daily with breakfast. 180 tablet 3   sildenafil (VIAGRA) 100 MG tablet Take 1 tablet (100 mg total) by mouth daily as needed for erectile dysfunction. 10 tablet 5   Syringe/Needle, Disp, (SYRINGE 3CC/22GX1") 22G X 1" 3 ML MISC Use with B12 injections 10 each 0   tamsulosin (FLOMAX) 0.4 MG CAPS capsule Take 0.4 mg by mouth at bedtime.     tiZANidine (ZANAFLEX) 4 MG tablet Take 1 tablet (4 mg total) by mouth every 6 (six) hours as needed for muscle spasms. 30 tablet 1   vitamin B-12 (CYANOCOBALAMIN) 1000 MCG tablet Take 1,000 mcg by mouth daily.  No current facility-administered medications on file prior to visit.    Allergies:  Allergies  Allergen Reactions   Covid-19 Mrna Vacc (Moderna) Other (See Comments)    Optic neuropathy after original 2 vaccines    Statins Other (See Comments)    Myositis    Vital Signs:  BP 117/72   Pulse (!) 54   Ht 5\' 9"  (1.753 m)   Wt 197 lb (89.4 kg)   SpO2 98%   BMI 29.09 kg/m   Neurological Exam: MENTAL STATUS including orientation to time, place, person, recent and remote memory, attention span and concentration, language, and fund of knowledge is normal.  Speech is not dysarthric.  CRANIAL NERVES: Normal conjugate, extra-ocular eye movements  in all directions of gaze.  Mild right ptosis at rest, no worsening with sustained upgaze.   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 No atrophy, fasciculations or abnormal movements.  No pronator drift.   COORDINATION/GAIT:   Gait narrow based and stable.   Data: MRI cervical spine without contrast 02/17/2019: 1. 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 2. Solid anterior fusion at C4-5 and C5-6 without spinal stenosis at these levels. 3. Adjacent segment degenerative changes with moderate spinal stenosis at C6-7, mild-to-moderate spinal stenosis at C3-4, and mild-to-moderate neural foraminal stenosis at both levels.   MRI lumbar spine 02/17/2019: 1. Relatively diffuse lumbar disc and facet degeneration without significant spinal stenosis. 2. Multilevel neural foraminal stenosis, moderate on the left at L5-S1. 3. Normal appearance of the conus medullaris and cauda equina.   MRI thoracic spine without contrast 02/17/2019: 1. Mild thoracic disc and facet degeneration without evidence of neural impingement. 2. Minimal prominence of the central canal of the mid to lower thoracic spinal cord without syrinx.  Left rectus femorus muscle biopsy 02/20/2019:  Active necrotizing inflammatory myopathy. Right chest wall skin biopsy:  Benign skin with fibrosis and mixed superficial and deep inflammation  Neuromuscular Lab - [facility] 03/20/2019:  Myopathy 2 panel - HMGCR 36,000 (normal <2500)  Lab Results  Component Value Date   CKTOTAL 854 (HH) 08/17/2021   CKMB CANCELED 03/21/2020   CKMBINDEX 23.6 (HH) 04/15/2021   TROPONINI 0.03 (HH) 02/17/2019    Lab Results  Component Value Date   HGBA1C 6.9 (H) 05/08/2021    IMPRESSION/PLAN: Anti-HMGR immune mediated myopathy, diagnosed 02/2019.  Managed on long course of IVIG (discontinued in April 2022), prednisone, and Cellcept (increased to 1000mg  BID in Feb 2022). His CK continue to fluctuate ~ 800s.  Fortunately, he is  asymptomatic and exam shows no weakness.   - Continue prednisone 20mg /d  - Continue Cellcept 1000mg  BID  - Follow CK monthly, may consider reducing frequency if CK remains stable  2.  Bilateral optic neuropathy, stable.  Previously evaluated by [facility]  Return to clinic in 4 months   Thank you for allowing me to participate in patient's care.  If I can answer any additional questions, I would be pleased to do so.    Sincerely,    Rithy Mandley K. Posey Pronto, DO

## 2021-09-06 ENCOUNTER — Telehealth: Payer: Medicare Other | Admitting: Emergency Medicine

## 2021-09-06 DIAGNOSIS — Z20828 Contact with and (suspected) exposure to other viral communicable diseases: Secondary | ICD-10-CM

## 2021-09-06 DIAGNOSIS — R6889 Other general symptoms and signs: Secondary | ICD-10-CM

## 2021-09-06 MED ORDER — OSELTAMIVIR PHOSPHATE 75 MG PO CAPS: 75.0000 mg | ORAL_CAPSULE | Freq: Two times a day (BID) | ORAL | 0 refills | Status: AC

## 2021-09-06 MED ORDER — XOFLUZA (80 MG DOSE) 1 X 80 MG PO TBPK: 80.0000 mg | ORAL_TABLET | Freq: Once | ORAL | 0 refills | Status: AC

## 2021-09-06 NOTE — Progress Notes (Signed)

## 2021-09-06 NOTE — Progress Notes (Signed)
I have spent 5 minutes in review of e-visit questionnaire, review and updating patient chart, medical decision making and response to patient.   Tyheim Vanalstyne, PA-C    

## 2021-09-06 NOTE — Addendum Note (Signed)
Addended by: Lestine Box on: 09/06/2021 10:57 AM   Modules accepted: Orders

## 2021-09-19 LAB — CK: Total CK: 1015 U/L (ref 41–331)

## 2021-09-20 ENCOUNTER — Other Ambulatory Visit: Payer: Self-pay | Admitting: Cardiology

## 2021-09-20 DIAGNOSIS — I2510 Atherosclerotic heart disease of native coronary artery without angina pectoris: Secondary | ICD-10-CM

## 2021-10-01 ENCOUNTER — Other Ambulatory Visit: Payer: Self-pay | Admitting: Cardiology

## 2021-10-01 DIAGNOSIS — I2510 Atherosclerotic heart disease of native coronary artery without angina pectoris: Secondary | ICD-10-CM

## 2021-10-01 DIAGNOSIS — E7800 Pure hypercholesterolemia, unspecified: Secondary | ICD-10-CM

## 2021-10-20 LAB — CK: Total CK: 974 U/L (ref 41–331)

## 2021-11-02 ENCOUNTER — Other Ambulatory Visit: Payer: Self-pay | Admitting: Cardiology

## 2021-11-02 DIAGNOSIS — I2510 Atherosclerotic heart disease of native coronary artery without angina pectoris: Secondary | ICD-10-CM

## 2021-11-05 ENCOUNTER — Other Ambulatory Visit: Payer: Self-pay | Admitting: Cardiology

## 2021-11-05 DIAGNOSIS — E7800 Pure hypercholesterolemia, unspecified: Secondary | ICD-10-CM

## 2021-11-05 DIAGNOSIS — I2510 Atherosclerotic heart disease of native coronary artery without angina pectoris: Secondary | ICD-10-CM

## 2021-11-11 HISTORY — PX: CATARACT EXTRACTION: SUR2

## 2021-11-13 ENCOUNTER — Other Ambulatory Visit: Payer: Self-pay | Admitting: Neurology

## 2021-11-13 NOTE — Progress Notes (Signed)
Phone [phone] In person visit   Subjective:   Stanley Boyd is a 71 y.o. year old very pleasant male patient who presents for/with See problem oriented charting Chief Complaint  Patient presents with   Follow-up   Hyperlipidemia   This visit occurred during the SARS-CoV-2 public health emergency.  Safety protocols were in place, including screening questions prior to the visit, additional usage of staff PPE, and extensive cleaning of exam room while observing appropriate contact time as indicated for disinfecting solutions.   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    Priority: High   CAD (coronary artery disease), native coronary artery 07/13/2015    Priority: High   S/P PTCA (percutaneous transluminal coronary angioplasty) 07/08/2015    Priority: High   Elevated PSA 02/25/2017    Priority: Medium    Hemiparesis ([place]) 09/18/2014    Priority: Medium    BPH (benign prostatic hyperplasia) 09/18/2014    Priority: Medium    Hyperlipidemia     Priority: Medium    Bradycardia     Priority: Medium    Polyp of sigmoid colon     Priority: Low   B12 deficiency 02/26/2019    Priority: Low   History of colonic polyps 10/15/2014    Priority: Low   Transaminitis 09/18/2014    Priority: Low   Ptosis of eyelid, right 10/02/2020   Other optic atrophy, 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     brimonidine (ALPHAGAN) 0.2 % ophthalmic solution INSTILL 1 DROP INTO RIGHT EYE THREE TIMES DAILY     calcium-vitamin D (OSCAL WITH D) 500-200 MG-UNIT TABS tablet Take 1 tablet by mouth 3 (three) times daily. 270 tablet 3   Carboxymethylcellulose Sodium (REFRESH TEARS OP) Place 1 drop into both eyes daily as needed (dry eyes).      colchicine 0.6 MG tablet Take 1 tablet (0.6 mg total) by mouth daily as needed (psuedogout pain). 30 tablet 2   ezetimibe (ZETIA) 10 MG tablet Take 1 tablet by mouth once daily 30 tablet 0   finasteride (PROSCAR) 5 MG tablet Take 5 mg by mouth at bedtime.      fluticasone (FLONASE) 50 MCG/ACT nasal spray Place 2 sprays into both nostrils daily as needed for allergies or rhinitis.     metFORMIN (GLUCOPHAGE) 1000 MG tablet Take 1 tablet (1,000 mg total) by mouth 2 (two) times daily with a meal. 180 tablet 3   mycophenolate (CELLCEPT) 500 MG tablet Take 2 tablets (1,000 mg total) by mouth 2 (two) times daily. 360 tablet 3   nitroGLYCERIN (NITROSTAT) 0.4 MG SL tablet Place 1 tablet (0.4 mg total) under the tongue every 5 (five) minutes as needed for chest pain. 25 tablet 4   predniSONE (DELTASONE) 10 MG tablet TAKE 2 TABLETS BY MOUTH ONCE DAILY WITH BREAKFAST 180 tablet 1   tamsulosin (FLOMAX) 0.4 MG CAPS capsule Take  0.4 mg by mouth at bedtime.     tiZANidine (ZANAFLEX) 4 MG tablet Take 1 tablet (4 mg total) by mouth every 6 (six) hours as needed for muscle spasms. 30 tablet 1   VASCEPA 1 g capsule Take 2 capsules by mouth twice daily 120 capsule 0   vitamin B-12 (CYANOCOBALAMIN) 1000 MCG tablet Take 1,000 mcg by mouth daily.     pantoprazole (PROTONIX) 20 MG tablet Take 1 tablet (20 mg total) by mouth daily. 90 tablet 3   sildenafil (VIAGRA) 100 MG tablet Take 1 tablet (100 mg total) by mouth daily as needed for erectile dysfunction. 10 tablet 11   No current facility-administered medications for this visit.     Objective:  BP 130/64    Pulse (!)  59    Temp 98.6 F (37 C)    Ht 5\' 9"  (1.753 m)    Wt 202 lb 12.8 oz (92 kg)    SpO2 97%    BMI 29.95 kg/m  Gen: NAD, resting comfortably CV: RRR no murmurs rubs or gallops Lungs: CTAB no crackles, wheeze, rhonchi Ext: no edema Skin: warm, dry Neuro: hemiparesis noted    Assessment and Plan   #social update- - booked [place] for next June 2023!   #vision concerns- eyelid surgery in December. Right cataract was done 2 weeks ago on Wednesday and left upcoming.    #Autoimmune necrotizing myopathy S: Patient remained on CellCept 1000mg  BID and prednisone 20mg . Weaned from  IVIG.  A/P:  overall stable- CK levels fluctuating from 900 to 1100 for most part- recently drawn and stable- continue current meds   #Optic neuropathy bilateral eyes-originally cared for by [facility] and later transitioned to [facility] ophthalmology.  Required high-dose steroids-thought potentially related to moderna vaccination-had severe symptoms around time of vaccination. Avoided covid vaccine as a result of prior issues.  - no worsening damage and not expected to worsen   #hypercalcemia- now taking once a day 500-200 mg with calcium  (down from two) and vitamin D. Hoping we will be able to see labs soon   #Steroid-induced hyperglycemia S: Medication: metformin 1000mg  twice a day while on prednisone Exercise and diet- not exercising much- quit walking Lab Results  Component Value Date   HGBA1C 7.2 11/16/2021   HGBA1C 6.9 (H) 05/08/2021   HGBA1C 7.6 (H) 12/11/2020   A/P: thinks in springtime will get moving better- wants to hold off on any changes- very mild poor control- prefer a1c under 7  #CAD #hyperlipidemia S: Medication: Aspirin 81 mg, Vascepa, Praluent 150 mg, one pen sub-q every 2 weeks, and  Zetia 10 mg daily   No chest pain or shortness of breath- no fatigue or hot sweats (how presented previously). Yearly visits in august with Dr. Nadyne Coombes Lab Results  Component Value Date   CHOL 133  05/08/2021   HDL 57 05/08/2021   LDLCALC 49 05/08/2021   LDLDIRECT 58 02/26/2020   TRIG 164 (H) 05/08/2021   CHOLHDL 2.3 05/08/2021   A/P: CAD asymptomatic- continue current meds. Lipids controlled- check next visit  #LFT - mild elevations no recent worsened symptoms- continued current meds as long as aslt/ast under 100- stable on recent check Lab Results  Component Value Date   ALT 53 (A) 11/16/2021   AST 41 (A) 11/16/2021   ALKPHOS 44 05/08/2021   BILITOT 0.3 05/08/2021   # B12 deficiency S: Current treatment/medication (oral vs. IM): cyanocobalamin 1053mcg orally daily -Metformin and PPI likely contributed Lab Results  Component  Value Date   VITAMINB12 610 08/07/2020  A/P: doing well on b12- continue curren tmeds   #elevated tsh- very mild increase in past Lab Results  Component Value Date   TSH 1.74 01/08/2021  - check next visit with labs  #ED- worsened with finasteride (psa trending down on this). Asked about trying viagra and thankfully had done well on this - no chest pain or shortness of breath but effective   #hemiparesis from prior stroke- noted but stable. Continue risk factor modification  Recommended follow up: Return in about 6 months (around 05/20/2022) for physical or sooner if needed. Future Appointments  Date Time Provider [place]  12/18/2021  9:30 AM Narda Amber K, DO [facility] None  04/08/2022  1:45 PM [facility] HEALTH COACH [facility]  06/04/2022 11:15 AM Adrian Prows, MD [facility] None    Lab/Order associations:   ICD-10-CM   1. Pure hypercholesterolemia  E78.00     2. Steroid-induced hyperglycemia  R73.9 Hemoglobin A1c   T38.0X5A Microalbumin / creatinine urine ratio    3. Hyperlipidemia, unspecified hyperlipidemia type  E78.5 Comprehensive metabolic panel    CBC with Differential/Platelet    Lipid panel    4. Coronary artery disease involving native coronary artery of native heart with other form of angina pectoris ([place])  I25.118      5. LFT elevation  R79.89     6. Elevated TSH  R79.89 TSH    7. Autoimmune necrotizing myopathy  G72.49 CK (Creatine Kinase)    8. Coronary artery disease involving native coronary artery of native heart without angina pectoris  I25.10       Meds ordered this encounter  Medications   metFORMIN (GLUCOPHAGE) 1000 MG tablet    Sig: Take 1 tablet (1,000 mg total) by mouth 2 (two) times daily with a meal.    Dispense:  180 tablet    Refill:  3   pantoprazole (PROTONIX) 20 MG tablet    Sig: Take 1 tablet (20 mg total) by mouth daily.    Dispense:  90 tablet    Refill:  3   sildenafil (VIAGRA) 100 MG tablet    Sig: Take 1 tablet (100 mg total) by mouth daily as needed for erectile dysfunction.    Dispense:  10 tablet    Refill:  11    I,Jada Bradford,acting as a scribe for Garret Reddish, MD.,have documented all relevant documentation on the behalf of Garret Reddish, MD,as directed by  Garret Reddish, MD while in the presence of Garret Reddish, MD.  I, Garret Reddish, MD, have reviewed all documentation for this visit. The documentation on 11/20/21 for the exam, diagnosis, procedures, and orders are all accurate and complete.  Return precautions advised.  Garret Reddish, MD

## 2021-11-16 ENCOUNTER — Other Ambulatory Visit: Payer: Self-pay | Admitting: Family Medicine

## 2021-11-16 LAB — COMPREHENSIVE METABOLIC PANEL
Albumin: 4.6 (ref 3.5–5.0)
Globulin: 2.2

## 2021-11-16 LAB — CBC AND DIFFERENTIAL
HCT: 43 (ref 41–53)
Hemoglobin: 14.6 (ref 13.5–17.5)
Platelets: 266 (ref 150–399)
WBC: 9.2

## 2021-11-16 LAB — BASIC METABOLIC PANEL
BUN: 14 (ref 4–21)
CO2: 26 — AB (ref 13–22)
Chloride: 96 — AB (ref 99–108)
Creatinine: 0.8 (ref 0.6–1.3)
Glucose: 120
Potassium: 4.1 (ref 3.4–5.3)
Sodium: 137 (ref 137–147)

## 2021-11-16 LAB — HEPATIC FUNCTION PANEL
ALT: 53 — AB (ref 10–40)
AST: 41 — AB (ref 14–40)

## 2021-11-16 LAB — HEMOGLOBIN A1C: Hemoglobin A1C: 7.2

## 2021-11-16 LAB — CBC: RBC: 4.78 (ref 3.87–5.11)

## 2021-11-17 LAB — CBC/DIFF AMBIGUOUS DEFAULT
Basophils Absolute: 0.1 10*3/uL (ref 0.0–0.2)
Basos: 1 %
EOS (ABSOLUTE): 0.1 10*3/uL (ref 0.0–0.4)
Eos: 1 %
Hematocrit: 42.7 % (ref 37.5–51.0)
Hemoglobin: 14.6 g/dL (ref 13.0–17.7)
Immature Grans (Abs): 0.1 10*3/uL (ref 0.0–0.1)
Immature Granulocytes: 1 %
Lymphocytes Absolute: 2.1 10*3/uL (ref 0.7–3.1)
Lymphs: 23 %
MCH: 30.5 pg (ref 26.6–33.0)
MCHC: 34.2 g/dL (ref 31.5–35.7)
MCV: 89 fL (ref 79–97)
Monocytes Absolute: 0.6 10*3/uL (ref 0.1–0.9)
Monocytes: 7 %
Neutrophils Absolute: 6.3 10*3/uL (ref 1.4–7.0)
Neutrophils: 67 %
Platelets: 266 10*3/uL (ref 150–450)
RBC: 4.78 x10E6/uL (ref 4.14–5.80)
RDW: 13 % (ref 11.6–15.4)
WBC: 9.2 10*3/uL (ref 3.4–10.8)

## 2021-11-17 LAB — COMPREHENSIVE METABOLIC PANEL
ALT: 53 IU/L — ABNORMAL HIGH (ref 0–44)
AST: 41 IU/L — ABNORMAL HIGH (ref 0–40)
Albumin/Globulin Ratio: 2.1 (ref 1.2–2.2)
Albumin: 4.6 g/dL (ref 3.8–4.8)
Alkaline Phosphatase: 49 IU/L (ref 44–121)
BUN/Creatinine Ratio: 18 (ref 10–24)
BUN: 14 mg/dL (ref 8–27)
Bilirubin Total: 0.4 mg/dL (ref 0.0–1.2)
CO2: 26 mmol/L (ref 20–29)
Calcium: 10.1 mg/dL (ref 8.6–10.2)
Chloride: 96 mmol/L (ref 96–106)
Creatinine, Ser: 0.79 mg/dL (ref 0.76–1.27)
Globulin, Total: 2.2 g/dL (ref 1.5–4.5)
Glucose: 120 mg/dL — ABNORMAL HIGH (ref 70–99)
Potassium: 4.1 mmol/L (ref 3.5–5.2)
Sodium: 137 mmol/L (ref 134–144)
Total Protein: 6.8 g/dL (ref 6.0–8.5)
eGFR: 97 mL/min/{1.73_m2} (ref >59)

## 2021-11-17 LAB — HGB A1C W/O EAG: Hgb A1c MFr Bld: 7.2 % — ABNORMAL HIGH (ref 4.8–5.6)

## 2021-11-17 LAB — CK: Total CK: 1087 U/L (ref 41–331)

## 2021-11-17 LAB — VITAMIN B12: Vitamin B-12: 1872 pg/mL — ABNORMAL HIGH (ref 232–1245)

## 2021-11-20 ENCOUNTER — Other Ambulatory Visit: Payer: Self-pay

## 2021-11-20 ENCOUNTER — Encounter: Payer: Self-pay | Admitting: Family Medicine

## 2021-11-20 ENCOUNTER — Ambulatory Visit: Payer: Medicare Other | Admitting: Family Medicine

## 2021-11-20 VITALS — BP 130/64 | HR 59 | Temp 98.6°F | Ht 69.0 in | Wt 202.8 lb

## 2021-11-20 DIAGNOSIS — I2510 Atherosclerotic heart disease of native coronary artery without angina pectoris: Secondary | ICD-10-CM

## 2021-11-20 DIAGNOSIS — R7989 Other specified abnormal findings of blood chemistry: Secondary | ICD-10-CM

## 2021-11-20 DIAGNOSIS — I25118 Atherosclerotic heart disease of native coronary artery with other forms of angina pectoris: Secondary | ICD-10-CM | POA: Diagnosis not present

## 2021-11-20 DIAGNOSIS — R739 Hyperglycemia, unspecified: Secondary | ICD-10-CM | POA: Diagnosis not present

## 2021-11-20 DIAGNOSIS — E7800 Pure hypercholesterolemia, unspecified: Secondary | ICD-10-CM

## 2021-11-20 DIAGNOSIS — G7249 Other inflammatory and immune myopathies, not elsewhere classified: Secondary | ICD-10-CM

## 2021-11-20 DIAGNOSIS — T380X5A Adverse effect of glucocorticoids and synthetic analogues, initial encounter: Secondary | ICD-10-CM

## 2021-11-20 DIAGNOSIS — I69959 Hemiplegia and hemiparesis following unspecified cerebrovascular disease affecting unspecified side: Secondary | ICD-10-CM

## 2021-11-20 DIAGNOSIS — E785 Hyperlipidemia, unspecified: Secondary | ICD-10-CM

## 2021-11-20 MED ORDER — METFORMIN HCL 1000 MG PO TABS: 1000.0000 mg | ORAL_TABLET | Freq: Two times a day (BID) | ORAL | 3 refills | Status: DC

## 2021-11-20 MED ORDER — PANTOPRAZOLE SODIUM 20 MG PO TBEC
20.0000 mg | DELAYED_RELEASE_TABLET | Freq: Every day | ORAL | 3 refills | Status: DC
  Filled 2022-08-12: qty 90, 90d supply, fill #0

## 2021-11-20 MED ORDER — SILDENAFIL CITRATE 100 MG PO TABS: 100.0000 mg | ORAL_TABLET | Freq: Every day | ORAL | 11 refills | Status: DC | PRN

## 2021-11-20 NOTE — Patient Instructions (Addendum)
Schedule labs July 29th or later and then visit a few days after.   Team please print all labs ordered today and he will take to [facility]  Recommended follow up: Return in about 6 months (around 05/20/2022) for physical or sooner if needed.

## 2021-11-30 ENCOUNTER — Other Ambulatory Visit: Payer: Self-pay

## 2021-11-30 ENCOUNTER — Encounter: Payer: Self-pay | Admitting: Student

## 2021-11-30 ENCOUNTER — Ambulatory Visit: Payer: Medicare Other | Admitting: Student

## 2021-11-30 VITALS — BP 114/77 | HR 79 | Temp 98.2°F | Resp 17 | Ht 69.0 in | Wt 201.6 lb

## 2021-11-30 DIAGNOSIS — I2510 Atherosclerotic heart disease of native coronary artery without angina pectoris: Secondary | ICD-10-CM

## 2021-11-30 DIAGNOSIS — I4891 Unspecified atrial fibrillation: Secondary | ICD-10-CM

## 2021-11-30 MED ORDER — APIXABAN 5 MG PO TABS: 5.0000 mg | ORAL_TABLET | Freq: Two times a day (BID) | ORAL | 3 refills | Status: DC

## 2021-11-30 NOTE — Progress Notes (Signed)
Primary Physician/Referring:  Marin Olp, MD  Patient ID: Stanley Boyd, male    DOB: 07-07-1951, 71 y.o.   MRN: [id number]  Chief Complaint  Patient presents with   Atrial Fibrillation   Abnormal ECG   HPI:    Stanley Boyd  is a 71 y.o. Caucasian male with CAD, staged PCI, first on 07/08/2015 with 3.0x38 & 3.0x69mm Resolute DES to prox and mid LAD, then on 07/17/2015 with stenting of the bifurcating large OM1 and proximal circumflex with implantation of 3.0 x 28 mm in the circumflex and 3.0 x 24 mm Synergy DES with [employer]. 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  Patient presents for urgent visit following recent emergency room evaluation.  Patient underwent surgery on 11/25/2021.  Postop patient stated he felt fatigued and dizzy, EKG at that time revealed atrial fibrillation, therefore patient was transferred to emergency department for further evaluation.  EKG in the ED revealed normal sinus rhythm.  He now presents to our office for follow-up.  Patient is accompanied by his wife at today's office visit.  Patient states he is feeling well overall denies palpitations, dizziness, syncope, near syncope.  Past Medical History:  Diagnosis Date   BPH (benign prostatic hyperplasia)    Bradycardia    40s to low 50s   Chronic back pain    multiple surgeries following fall from a deer stand   Coronary artery disease    5 stents   Headache    "probably monthly" (07/08/2015)   Hyperlipidemia    on injectable medication   Kidney stones    "years ago; never had OR/scopes"   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   Neuromuscular disorder ([place])    Pneumonia 08/2014   Past Surgical History:  Procedure Laterality Date   ANTERIOR CERVICAL DECOMP/DISCECTOMY FUSION  19; 48; [place]  2017   CARDIAC CATHETERIZATION N/A 07/08/2015   Procedure: Left Heart Cath and Coronary Angiography;  Surgeon: Adrian Prows, MD;  Location: [facility];  Service: Cardiovascular;  Laterality: N/A;   CARDIAC CATHETERIZATION N/A 07/17/2015   Procedure: Coronary Stent Intervention;  Surgeon: Adrian Prows, MD;  Location: [place] [facility];  Service: Cardiovascular;  Laterality: N/A;   CARDIAC CATHETERIZATION N/A 04/16/2016   Procedure: Left Heart Cath and Coronary Angiography;  Surgeon: Adrian Prows, MD;  Location: [place] [facility];  Service: Cardiovascular;  Laterality: N/A;   CARDIAC CATHETERIZATION N/A 04/16/2016   Procedure: Coronary Stent Intervention;  Surgeon: Adrian Prows, MD;  Location: [place] [facility];  Service: Cardiovascular;  Laterality: N/A;   COLONOSCOPY N/A 04/05/2014   Procedure: COLONOSCOPY;  Surgeon: Danie Binder, MD;  [facility];  Service: Endoscopy;  Laterality: N/A;  10:30 AM   COLONOSCOPY N/A 01/25/2020   Procedure: COLONOSCOPY;  Surgeon: Danie Binder, MD;  [facility];  Service: Endoscopy;  Laterality: N/A;  9:30   CORONARY ANGIOPLASTY WITH STENT PLACEMENT  07/08/2015   "2 stents"   CORONARY STENT PLACEMENT  04/16/2016   PTCA and stenting of the ostial LAD with implantation of a 3.0 x 12 mm resolute integrity DES, stenosis reduced from 80% to 0% with maintenance of TIMI-3 flow. Ostial OM1 in-stent restenosis reduced to less than 20% with a  2.0 x 12 mm emerge balloon at 14 atmospheric pressure   LEFT HEART CATH AND CORONARY ANGIOGRAPHY N/A 05/13/2020   Procedure: LEFT HEART CATH AND CORONARY ANGIOGRAPHY;  Surgeon: Adrian Prows, MD;  Location: [place] [facility];  Service: Cardiovascular;  Laterality: N/A;   LIVER BIOPSY  03/2019   LUMBAR DISC SURGERY  1984   MUSCLE BIOPSY N/A 02/20/2019   Procedure: LEFT QUADRICEP MUSCLE  BIOPSY AND PUNCH BIOPSY OF RIGHT CHEST;  Surgeon: Ileana Roup, MD;  Location: [facility];   Service: General;  Laterality: N/A;   POLYPECTOMY  01/25/2020   Procedure: POLYPECTOMY;  Surgeon: Danie Binder, MD;  [facility];  Service: Endoscopy;;  sigmoid   PTCA  07/17/2015   OMI    DES   Social History   Tobacco Use   Smoking status: Never   Smokeless tobacco: Never  Substance Use Topics   Alcohol use: Not Currently   Marital Status: Married   ROS  Review of Systems  Constitutional: Negative for malaise/fatigue and weight gain.  Cardiovascular:  Negative for chest pain, claudication, dyspnea on exertion, leg swelling, near-syncope, orthopnea, palpitations, paroxysmal nocturnal dyspnea and syncope.  Respiratory:  Negative for shortness of breath.   Gastrointestinal:  Negative for melena.  Neurological:  Negative for dizziness.   Objective   Vitals with BMI 11/30/2021 11/20/2021 08/19/2021  Height 5\' 9"  5\' 9"  5\' 9"   Weight 201 lbs 10 oz 202 lbs 13 oz 197 lbs  BMI 29.76 02.72 53.66  Systolic 440 347 425  Diastolic 77 64 72  Pulse 79 59 54    Blood pressure 114/77, pulse 79, temperature 98.2 F (36.8 C), temperature source Temporal, resp. rate 17, height 5\' 9"  (1.753 m), weight 201 lb 9.6 oz (91.4 kg), SpO2 96 %. Body mass index is 29.77 kg/m.   Physical Exam Vitals reviewed.  Cardiovascular:     Rate and Rhythm: Normal rate. Rhythm irregularly irregular.     Pulses: Intact distal pulses.     Heart sounds: Normal heart sounds. No murmur heard.   No gallop.     Comments: No leg edema, no JVD. Pulmonary:     Effort: Pulmonary effort is normal.     Breath sounds: Normal breath sounds.  Musculoskeletal:     Right lower leg: No edema.     Left lower leg: No edema.    Radiology: No results found.  Laboratory examination:   Recent Labs    12/11/20 1133 05/08/21 1008 11/16/21 0000 11/16/21 1140  NA 140 144 137 137  K 5.0 4.8 4.1 4.1  CL 98 100 96* 96  CO2 24 26 26* 26  GLUCOSE 139* 95  --  120*  BUN 21 16 14 14   CREATININE 0.99 0.97 0.8 0.79   CALCIUM 10.2 10.7*  --  10.1   CMP Latest Ref Rng & Units 11/16/2021 11/16/2021 05/08/2021  Glucose 70 - 99 mg/dL 120(H) - 95  BUN 8 - 27 mg/dL 14 14 16   Creatinine 0.76 - 1.27 mg/dL 0.79 0.8 0.97  Sodium 134 - 144 mmol/L 137 137 144  Potassium 3.5 - 5.2 mmol/L 4.1 4.1 4.8  Chloride 96 - 106 mmol/L 96 96(A) 100  CO2 20 - 29 mmol/L 26 26(A) 26  Calcium 8.6 - 10.2 mg/dL 10.1 - 10.7(H)  Total Protein 6.0 - 8.5 g/dL 6.8 - 6.8  Total Bilirubin 0.0 - 1.2 mg/dL 0.4 - 0.3  Alkaline Phos 44 - 121 IU/L 49 - 44  AST  0 - 40 IU/L 41(H) 41(A) 45(H)  ALT 0 - 44 IU/L 53(H) 53(A) 54(H)   CBC Latest Ref Rng & Units 11/16/2021 11/16/2021 05/08/2021  WBC 3.4 - 10.8 x10E3/uL 9.2 9.2 8.4  Hemoglobin 13.0 - 17.7 g/dL 14.6 14.6 14.7  Hematocrit 37.5 - 51.0 % 42.7 43 45.1  Platelets 150 - 450 x10E3/uL 266 266 239   Lipid Panel Recent Labs    05/08/21 1008  CHOL 133  TRIG 164*  LDLCALC 49  HDL 57  CHOLHDL 2.3     HEMOGLOBIN A1C Lab Results  Component Value Date   HGBA1C 7.2 (H) 11/16/2021   MPG 146 08/07/2020   TSH Recent Labs    01/08/21 1135  TSH 1.74   Medications and allergies   Allergies  Allergen Reactions   Covid-19 Mrna Vacc (Moderna) Other (See Comments)    Optic neuropathy after original 2 vaccines    Statins Other (See Comments)    Myositis     Current Outpatient Medications  Medication Instructions   Alirocumab (PRALUENT) 150 MG/ML SOAJ INJECT THE CONTENTS OF ONE PEN SUBCUTANEOUSLY EVERY 2 WEEKS   apixaban (ELIQUIS) 5 mg, Oral, 2 times daily   brimonidine (ALPHAGAN) 0.2 % ophthalmic solution INSTILL 1 DROP INTO RIGHT EYE THREE TIMES DAILY   calcium-vitamin D (OSCAL WITH D) 500-200 MG-UNIT TABS tablet 1 tablet, Oral, 3 times daily   Carboxymethylcellulose Sodium (REFRESH TEARS OP) 1 drop, Both Eyes, Daily PRN   colchicine 0.6 mg, Oral, Daily PRN   ezetimibe (ZETIA) 10 MG tablet Take 1 tablet by mouth once daily   finasteride (PROSCAR) 5 mg, Oral, Daily at bedtime    fluticasone (FLONASE) 50 MCG/ACT nasal spray 2 sprays, Each Nare, Daily PRN   ketorolac (ACULAR) 0.5 % ophthalmic solution 1 drop, Left Eye, 3 times daily   metFORMIN (GLUCOPHAGE) 1,000 mg, Oral, 2 times daily with meals   mycophenolate (CELLCEPT) 1,000 mg, Oral, 2 times daily   nitroGLYCERIN (NITROSTAT) 0.4 mg, Sublingual, Every 5 min PRN   pantoprazole (PROTONIX) 20 mg, Oral, Daily   predniSONE (DELTASONE) 10 MG tablet TAKE 2 TABLETS BY MOUTH ONCE DAILY WITH BREAKFAST   sildenafil (VIAGRA) 100 mg, Oral, Daily PRN   tamsulosin (FLOMAX) 0.4 mg, Oral, Daily at bedtime   tiZANidine (ZANAFLEX) 4 mg, Oral, Every 6 hours PRN   VASCEPA 1 g capsule Take 2 capsules by mouth twice daily   vitamin B-12 (CYANOCOBALAMIN) 1,000 mcg, Oral, Daily   Cardiac Studies:   Coronary angiogram 04/16/16: Proximal Cx/OM1 bifurcating  (3.0 x 28 mm in the circumflex and 3.0 x 24 mm  Synergy DES). 07/08/2015: PCI to proximal and mid LAD with 3.0x38 and 3.5x15 mm Resolute DES (07/08/15).  Presented with near syncope Focal OM restenosis S/P Balloon PTCA  Echocardiogram 04/092020 :   1. The left ventricle has normal systolic function, with an ejection fraction of 60-65%. The cavity size was normal. Left ventricular diastolic Doppler parameters are consistent with impaired relaxation.  2. The right ventricle has normal systolc function. The cavity was normal. There is no increase in right ventricular wall thickness.  Left Heart Catheterization 05/13/20:  Normal LV systolic function, normal LVEDP. Very mild in-stent restenosis in the distal segment of the LAD stent.  Otherwise minimal disease in the LAD and circumflex coronary artery.  Left main is widely patent. Right coronary artery has about a 30% stenosis in the midsegment with mild contrast hung up proximal to the lesion however this is also evident on prior angiography.  Rec: Evaluation for noncardiac causes for his fatigue and dyspnea as indicated.  Suspect his  symptoms are probably related to generalized deconditioning.  EKG: 11/30/2021: Atrial fibrillation with controlled ventricular response at a rate of 70 bpm.  Normal axis.  No evidence of ischemia or underlying injury pattern  EKG 06/05/2021: Marked sinus bradycardia at rate of 44 bpm, normal axis, LVH with repolarization abnormality.  Cannot exclude lateral ischemia.  No significant change from 01/18/2020.      Assessment     ICD-10-CM   1. Atrial fibrillation, unspecified type (HCC)  I48.91 EKG 12-Lead    LONG TERM MONITOR (3-14 DAYS)    2. Coronary artery disease involving native coronary artery of native heart without angina pectoris  I25.10       This patients CHA2DS2-VASc Score 3 (CAD, Age, DM) and yearly risk of stroke 3.2%.   Recommendations:   Stanley Boyd  is a 71 y.o. Caucasian male with CAD, staged PCI, first on 07/08/2015 with 3.0x38 & 3.0x40mm Resolute DES to prox and mid LAD, then on 07/17/2015 with stenting of the bifurcating large OM1 and proximal circumflex with implantation of 3.0 x 28 mm in the circumflex and 3.0 x 24 mm Synergy DES with [employer]. 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  Patient also has history of diabetes given chronic steroid use.  Patient presents today after recent ED visit at an external hospital revealing new onset atrial fibrillation.  Patient remains in atrial fibrillation which is well rate controlled and he is fairly asymptomatic.  Given CHA2DS2-VASc score of 3 discussed with patient initiation of anticoagulation reviewed with patient indication, risk, and benefits of anticoagulation.  Patient and wife both verbalized understanding and wished to proceed with initiation of Eliquis.  Patient will start anticoagulation and we will follow-up in 3 weeks with repeat EKG to evaluate at that time further recommendations regarding  rhythm control.  Reviewed and discussed at length with patient and his wife pathophysiology of atrial fibrillation.  We will obtain Zio patch monitor to evaluate A-fib burden and whether patient has paroxysmal versus persistent A-fib.  Advised patient at follow-up visit we will also consider repeat ischemic evaluation as well as echocardiogram.  Counseled patient regarding signs and symptoms that would warrant urgent or emergent evaluation, he verbalized understanding agreement.  I personally reviewed external EKGs and labs as well as provider notes.  Patient was seen in collaboration with Dr. Amanda Cockayne and he is in agreement with the plan.    Alethia Berthold, PA-C 11/30/2021, 5:08 PM Office: [phone]

## 2021-12-01 ENCOUNTER — Inpatient Hospital Stay: Payer: Medicare Other

## 2021-12-01 ENCOUNTER — Other Ambulatory Visit: Payer: Self-pay | Admitting: Cardiology

## 2021-12-01 DIAGNOSIS — I4891 Unspecified atrial fibrillation: Secondary | ICD-10-CM

## 2021-12-01 DIAGNOSIS — E7800 Pure hypercholesterolemia, unspecified: Secondary | ICD-10-CM

## 2021-12-01 DIAGNOSIS — I2510 Atherosclerotic heart disease of native coronary artery without angina pectoris: Secondary | ICD-10-CM

## 2021-12-06 ENCOUNTER — Encounter: Payer: Self-pay | Admitting: Neurology

## 2021-12-07 ENCOUNTER — Other Ambulatory Visit: Payer: Self-pay

## 2021-12-07 DIAGNOSIS — Z5181 Encounter for therapeutic drug level monitoring: Secondary | ICD-10-CM

## 2021-12-07 DIAGNOSIS — G7249 Other inflammatory and immune myopathies, not elsewhere classified: Secondary | ICD-10-CM

## 2021-12-12 LAB — CK: Total CK: 862 U/L (ref 41–331)

## 2021-12-17 NOTE — Progress Notes (Unsigned)
Primary Physician/Referring:  Marin Olp, MD  Patient ID: Stanley Boyd, male    DOB: 02-03-1951, 71 y.o.   MRN: [id number]  No chief complaint on file.  HPI:    Stanley Boyd  is a 71 y.o. Caucasian male with CAD, staged PCI, first on 07/08/2015 with 3.0x38 & 3.0x6m Resolute DES to prox and mid LAD, then on 07/17/2015 with stenting of the bifurcating large OM1 and proximal circumflex with implantation of 3.0 x 28 mm in the circumflex and 3.0 x 24 mm Synergy DES with [employer]. 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  Diagnosed with new onset atrial fibrillation 11/25/2021.  Patient presents for follow-up last office visit started patient on anticoagulation with Eliquis and obtained 2-week Zio patch monitor.  Cardiac monitor revealed atrial fibrillation burden <1% with the longest episode lasting 8 minutes and 6 seconds.  Monitor did reveal 2 asymptomatic episodes of ventricular tachycardia with the longest lasting 5 beats as well as episodes of supraventricular tachycardia and frequent PACs with PAC burden of 5.4%.  There were no patient triggered events.***   ***Needs stress test.   Patient presents for urgent visit following recent emergency room evaluation.  Patient underwent surgery on 11/25/2021.  Postop patient stated he felt fatigued and dizzy, EKG at that time revealed atrial fibrillation, therefore patient was transferred to emergency department for further evaluation.  EKG in the ED revealed normal sinus rhythm.  He now presents to our office for follow-up.  Patient is accompanied by his wife at today's office visit.  Patient states he is feeling well overall denies palpitations, dizziness, syncope, near syncope.  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   Neuromuscular disorder ([facility]    Pneumonia 08/2014   Past Surgical History:  Procedure Laterality Date   ANTERIOR CERVICAL DECOMP/DISCECTOMY FUSION  158 180 [address] 2017   CARDIAC CATHETERIZATION N/A 07/08/2015   Procedure: Left Heart Cath and Coronary Angiography;  Surgeon: JAdrian Prows MD;  [facility];  Service: Cardiovascular;  Laterality: N/A;   CARDIAC CATHETERIZATION N/A 07/17/2015   Procedure: Coronary Stent Intervention;  Surgeon: JAdrian Prows MD;  [facility];  Service: Cardiovascular;  Laterality: N/A;   CARDIAC CATHETERIZATION N/A 04/16/2016   Procedure: Left Heart Cath and Coronary Angiography;  Surgeon: JAdrian Prows MD;  [facility];  Service: Cardiovascular;  Laterality: N/A;   CARDIAC CATHETERIZATION N/A 04/16/2016   Procedure: Coronary Stent Intervention;  Surgeon: JAdrian Prows MD;  [facility];  Service: Cardiovascular;  Laterality: N/A;   COLONOSCOPY N/A 04/05/2014   Procedure: COLONOSCOPY;  Surgeon: SDanie Binder MD;  [facility];  Service: Endoscopy;  Laterality: N/A;  10:30 AM   COLONOSCOPY N/A 01/25/2020   Procedure: COLONOSCOPY;  Surgeon: Danie Binder, MD;  [facility];  Service: Endoscopy;  Laterality: N/A;  9:30   CORONARY ANGIOPLASTY WITH STENT PLACEMENT  07/08/2015   "2 stents"   CORONARY STENT PLACEMENT  04/16/2016   PTCA and stenting of the ostial LAD with implantation of a 3.0 x 12 mm resolute integrity DES, stenosis reduced from 80% to 0% with maintenance of TIMI-3 flow. Ostial OM1  in-stent restenosis reduced to less than 20% with a 2.0 x 12 mm emerge balloon at 14 atmospheric pressure   LEFT HEART CATH AND CORONARY ANGIOGRAPHY N/A 05/13/2020   Procedure: LEFT HEART CATH AND CORONARY ANGIOGRAPHY;  Surgeon: Adrian Prows, MD;  Location: [facility];  Service: Cardiovascular;  Laterality: N/A;   LIVER BIOPSY  03/2019   LUMBAR DISC SURGERY  1984   MUSCLE BIOPSY N/A 02/20/2019   Procedure: LEFT QUADRICEP MUSCLE  BIOPSY AND PUNCH BIOPSY OF RIGHT CHEST;  Surgeon: Ileana Roup, MD;  Location: [place];  Service: General;  Laterality: N/A;   POLYPECTOMY  01/25/2020   Procedure: POLYPECTOMY;  Surgeon: Danie Binder, MD;  [facility];  Service: Endoscopy;;  sigmoid   PTCA  07/17/2015   OMI    DES   Social History   Tobacco Use   Smoking status: Never   Smokeless tobacco: Never  Substance Use Topics   Alcohol use: Not Currently   Marital Status: Married   ROS  Review of Systems  Constitutional: Negative for malaise/fatigue and weight gain.  Cardiovascular:  Negative for chest pain, claudication, dyspnea on exertion, leg swelling, near-syncope, orthopnea, palpitations, paroxysmal nocturnal dyspnea and syncope.  Respiratory:  Negative for shortness of breath.   Gastrointestinal:  Negative for melena.  Neurological:  Negative for dizziness.   Objective   Vitals with BMI 11/30/2021 11/20/2021 08/19/2021  Height '5\' 9"'$  '5\' 9"'$  '5\' 9"'$   Weight 201 lbs 10 oz 202 lbs 13 oz 197 lbs  BMI 29.76 40.97 35.32  Systolic 992 426 834  Diastolic 77 64 72  Pulse 79 59 54    There were no vitals taken for this visit. There is no height or weight on file to calculate BMI.   Physical Exam Vitals reviewed.  Cardiovascular:     Rate and Rhythm: Normal rate. Rhythm irregularly irregular.     Pulses: Intact distal pulses.     Heart sounds: Normal heart sounds. No murmur heard.   No gallop.     Comments: No leg edema, no JVD. Pulmonary:     Effort: Pulmonary effort is  normal.     Breath sounds: Normal breath sounds.  Musculoskeletal:     Right lower leg: No edema.     Left lower leg: No edema.    Laboratory examination:   Recent Labs    05/08/21 1008 11/16/21 0000 11/16/21 1140  NA 144 137 137  K 4.8 4.1 4.1  CL 100 96* 96  CO2 26 26* 26  GLUCOSE 95  --  120*  BUN '16 14 14  '$ CREATININE 0.97 0.8 0.79  CALCIUM 10.7*  --  10.1    CMP Latest Ref Rng & Units 11/16/2021 11/16/2021 05/08/2021  Glucose 70 - 99 mg/dL 120(H) - 95  BUN 8 - 27 mg/dL '14 14 16  '$ Creatinine 0.76 - 1.27 mg/dL 0.79 0.8 0.97  Sodium 134 - 144 mmol/L 137 137 144  Potassium 3.5 - 5.2 mmol/L 4.1 4.1 4.8  Chloride 96 - 106 mmol/L 96 96(A) 100  CO2  20 - 29 mmol/L 26 26(A) 26  Calcium 8.6 - 10.2 mg/dL 10.1 - 10.7(H)  Total Protein 6.0 - 8.5 g/dL 6.8 - 6.8  Total Bilirubin 0.0 - 1.2 mg/dL 0.4 - 0.3  Alkaline Phos 44 - 121 IU/L 49 - 44  AST 0 - 40 IU/L 41(H) 41(A) 45(H)  ALT 0 - 44 IU/L 53(H) 53(A) 54(H)   CBC Latest Ref Rng & Units 11/16/2021 11/16/2021 05/08/2021  WBC 3.4 - 10.8 x10E3/uL 9.2 9.2 8.4  Hemoglobin 13.0 - 17.7 g/dL 14.6 14.6 14.7  Hematocrit 37.5 - 51.0 % 42.7 43 45.1  Platelets 150 - 450 x10E3/uL 266 266 239   Lipid Panel Recent Labs    05/08/21 1008  CHOL 133  TRIG 164*  LDLCALC 49  HDL 57  CHOLHDL 2.3     HEMOGLOBIN A1C Lab Results  Component Value Date   HGBA1C 7.2 (H) 11/16/2021   MPG 146 08/07/2020   TSH Recent Labs    01/08/21 1135  TSH 1.74    Allergies   Allergies  Allergen Reactions   Covid-19 Mrna Vacc (Moderna) Other (See Comments)    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 60  tablet 3   brimonidine (ALPHAGAN) 0.2 % ophthalmic solution INSTILL 1 DROP INTO RIGHT EYE THREE TIMES DAILY     calcium-vitamin D (OSCAL WITH D) 500-200 MG-UNIT TABS tablet Take 1 tablet by mouth 3 (three) times daily. 270 tablet 3   Carboxymethylcellulose Sodium (REFRESH TEARS OP) Place 1 drop into both eyes daily as needed (dry eyes).      colchicine 0.6 MG tablet Take 1 tablet (0.6 mg total) by mouth daily as needed (psuedogout pain). 30 tablet 2   ezetimibe (ZETIA) 10 MG tablet Take 1 tablet by mouth once daily 30 tablet 0   finasteride (PROSCAR) 5 MG tablet Take 5 mg by mouth at bedtime.      fluticasone (FLONASE) 50 MCG/ACT nasal spray Place 2 sprays into both nostrils daily as needed for allergies or rhinitis.     ketorolac (ACULAR) 0.5 % ophthalmic solution Place 1 drop into the left eye in the morning, at noon, and at bedtime.     metFORMIN (GLUCOPHAGE) 1000 MG tablet Take 1 tablet (1,000 mg total) by mouth 2 (two) times daily with a meal. 180 tablet 3   mycophenolate (CELLCEPT) 500 MG tablet Take 2 tablets (1,000 mg total) by mouth 2 (two) times daily. 360 tablet 3   nitroGLYCERIN (NITROSTAT) 0.4 MG SL tablet Place 1 tablet (0.4 mg total) under the tongue every 5 (five) minutes as needed for chest pain. 25 tablet 4   pantoprazole (PROTONIX) 20 MG tablet Take 1 tablet (20 mg total) by mouth daily. 90 tablet 3   predniSONE (DELTASONE) 10 MG tablet TAKE 2 TABLETS BY MOUTH ONCE DAILY WITH BREAKFAST 180 tablet 1   sildenafil (VIAGRA) 100 MG tablet Take 1 tablet (100 mg total) by mouth daily as needed for erectile dysfunction. 10 tablet 11   tamsulosin (FLOMAX) 0.4 MG CAPS capsule Take 0.4 mg by mouth at bedtime.     tiZANidine (  ZANAFLEX) 4 MG tablet Take 1 tablet (4 mg total) by mouth every 6 (six) hours as needed for muscle spasms. 30 tablet 1   VASCEPA 1 g capsule Take 2 capsules by mouth twice daily 120 capsule 0   vitamin B-12 (CYANOCOBALAMIN) 1000 MCG tablet Take 1,000 mcg by mouth daily.      No facility-administered medications prior to visit.   Final Medications at End of Visit    No outpatient medications have been marked as taking for the 12/23/21 encounter (Appointment) with Rayetta Pigg, Norah Devin C, PA-C.   Radiology   No results found.  Cardiac Studies:   Coronary angiogram 04/16/16: Proximal Cx/OM1 bifurcating  (3.0 x 28 mm in the circumflex and 3.0 x 24 mm  Synergy DES). 07/08/2015: PCI to proximal and mid LAD with 3.0x38 and 3.5x15 mm Resolute DES (07/08/15).  Presented with near syncope Focal OM restenosis S/P Balloon PTCA  Echocardiogram 04/092020 :   1. The left ventricle has normal systolic function, with an ejection fraction of 60-65%. The cavity size was normal. Left ventricular diastolic Doppler parameters are consistent with impaired relaxation.  2. The right ventricle has normal systolc function. The cavity was normal. There is no increase in right ventricular wall thickness.  Left Heart Catheterization 05/13/20:  Normal LV systolic function, normal LVEDP. Very mild in-stent restenosis in the distal segment of the LAD stent.  Otherwise minimal disease in the LAD and circumflex coronary artery.  Left main is widely patent. Right coronary artery has about a 30% stenosis in the midsegment with mild contrast hung up proximal to the lesion however this is also evident on prior angiography.   Rec: Evaluation for noncardiac causes for his fatigue and dyspnea as indicated.  Suspect his symptoms are probably related to generalized deconditioning.  Ambulatory cardiac telemetry 3 days (12/01/2021 - 12/04/2021): Predominant underlying rhythm was sinus.  2 episodes of ventricular tachycardia longest lasting 5 beats, both asymptomatic.  Episodes of supraventricular tachycardia longest lasting 18 beats with a maximum heart rate of 210 bpm.  Atrial fibrillation burden <1%, longest lasting episode 8 minutes and 6 seconds.  Frequent PACs with PAC burden of 5.4%, rare PVCs.   Ventricular trigeminy was present.  There were no patient triggered events.    EKG   11/30/2021: Atrial fibrillation with controlled ventricular response at a rate of 70 bpm.  Normal axis.  No evidence of ischemia or underlying injury pattern  EKG 06/05/2021: Marked sinus bradycardia at rate of 44 bpm, normal axis, LVH with repolarization abnormality.  Cannot exclude lateral ischemia.  No significant change from 01/18/2020.     Assessment   No diagnosis found.   This patients CHA2DS2-VASc Score 3 (CAD, Age, DM) and yearly risk of stroke 3.2%.   Recommendations:   Ramesh Moan  is a 71 y.o. Caucasian male with CAD, staged PCI, first on 07/08/2015 with 3.0x38 & 3.0x59m Resolute DES to prox and mid LAD, then on 07/17/2015 with stenting of the bifurcating large OM1 and proximal circumflex with implantation of 3.0 x 28 mm in the circumflex and 3.0 x 24 mm Synergy DES with [employer]. 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  Patient also has history of diabetes given chronic steroid use.  Patient presents for follow-up last office visit started patient on anticoagulation with Eliquis and obtained 2-week Zio patch monitor.  Cardiac monitor revealed atrial fibrillation burden <1% with the longest episode  lasting 8 minutes and 6 seconds.  Monitor did reveal 2 asymptomatic episodes of ventricular tachycardia with the longest lasting 5 beats as well as episodes of supraventricular tachycardia and frequent PACs with PAC burden of 5.4%.  There were no patient triggered events.***   ***Needs stress test.   Patient presents today after recent ED visit at an external hospital revealing new onset atrial fibrillation.  Patient remains in atrial fibrillation which is well rate controlled and he is fairly asymptomatic.  Given CHA2DS2-VASc score of 3 discussed with patient initiation of  anticoagulation reviewed with patient indication, risk, and benefits of anticoagulation.  Patient and wife both verbalized understanding and wished to proceed with initiation of Eliquis.  Patient will start anticoagulation and we will follow-up in 3 weeks with repeat EKG to evaluate at that time further recommendations regarding rhythm control.  Reviewed and discussed at length with patient and his wife pathophysiology of atrial fibrillation.  We will obtain Zio patch monitor to evaluate A-fib burden and whether patient has paroxysmal versus persistent A-fib.  Advised patient at follow-up visit we will also consider repeat ischemic evaluation as well as echocardiogram.  Counseled patient regarding signs and symptoms that would warrant urgent or emergent evaluation, he verbalized understanding agreement.  I personally reviewed external EKGs and labs as well as provider notes.  Patient was seen in collaboration with Dr. Amanda Cockayne and he is in agreement with the plan.    Alethia Berthold, PA-C 12/17/2021, 4:59 PM Office: [phone]

## 2021-12-17 NOTE — Progress Notes (Signed)
? ? ?Follow-up Visit ? ? ?Date: 12/18/21 ? ? ?Stanley Boyd ?MRN: [id number] ?DOB: 1951-05-14 ? ? ?Interim History: ?Stanley Boyd is a 71 y.o. 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  The patient was accompanied to the clinic by wife who also provides collateral information.   ? ?He has been doing very well from a neurological perspective and denies new weakness. CKs have been stable ranging [id number].  He underwent bilateral blepharoplasty in December and bilateral cataract surgery on 2/15.  Unfortunately, post-op he developed hypotension and atrial fibrillation.  He is followed by Dr. Einar Gip and started on Eliquis.  Vision has improved to 20/25 OU, which he is very pleased with. ? ?  ?Medications:  ?Current Outpatient Medications on File Prior to Visit  ?Medication Sig Dispense Refill  ? Alirocumab (PRALUENT) 150 MG/ML SOAJ INJECT THE CONTENTS OF ONE PEN SUBCUTANEOUSLY EVERY 2 WEEKS 2 mL 0  ? apixaban (ELIQUIS) 5 MG TABS tablet Take 1 tablet (5 mg total) by mouth 2 (two) times daily. 60 tablet 3  ? brimonidine (ALPHAGAN) 0.2 % ophthalmic solution INSTILL 1 DROP INTO RIGHT EYE THREE TIMES DAILY    ? calcium-vitamin D (OSCAL WITH D) 500-200 MG-UNIT TABS tablet Take 1 tablet by mouth 3 (three) times daily. 270 tablet 3  ? Carboxymethylcellulose Sodium (REFRESH TEARS OP) Place 1 drop into both eyes daily as needed (dry eyes).     ? colchicine 0.6 MG tablet Take 1 tablet (0.6 mg total) by mouth daily as needed (psuedogout pain). 30 tablet 2  ? ezetimibe (ZETIA) 10 MG tablet Take 1 tablet by mouth once daily 30 tablet 0  ? finasteride (PROSCAR) 5 MG tablet Take 5 mg by mouth at bedtime.     ? fluticasone (FLONASE) 50 MCG/ACT nasal spray Place 2 sprays into both nostrils daily  as needed for allergies or rhinitis.    ? ketorolac (ACULAR) 0.5 % ophthalmic solution Place 1 drop into the left eye in the morning, at noon, and at bedtime.    ? metFORMIN (GLUCOPHAGE) 1000 MG tablet Take 1 tablet (1,000 mg total) by mouth 2 (two) times daily with a meal. 180 tablet 3  ? nitroGLYCERIN (NITROSTAT) 0.4 MG SL tablet Place 1 tablet (0.4 mg total) under the tongue every 5 (five) minutes as needed for chest pain. 25 tablet 4  ? pantoprazole (PROTONIX) 20 MG tablet Take 1 tablet (20 mg total) by mouth daily. 90 tablet 3  ? sildenafil (VIAGRA) 100 MG tablet Take 1 tablet (100 mg total) by mouth daily as needed for erectile dysfunction. 10 tablet 11  ? tamsulosin (FLOMAX) 0.4 MG CAPS capsule Take 0.4 mg by mouth at bedtime.    ? tiZANidine (ZANAFLEX) 4 MG tablet Take 1 tablet (4 mg total) by mouth every 6 (six) hours as needed for muscle spasms. 30 tablet 1  ? VASCEPA 1 g capsule Take 2 capsules by mouth twice daily 120 capsule 0  ? vitamin B-12 (CYANOCOBALAMIN) 1000 MCG tablet Take 1,000 mcg by mouth daily.    ? ?No current facility-administered medications on file prior to visit.  ? ? ?Allergies:  ?Allergies  ?Allergen Reactions  ? Covid-19 Mrna Vacc (Moderna) Other (See Comments)  ?  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 Statins Other (See Comments)  ?  Myositis  ? ? ?  Vital Signs:  ?BP 131/72   Pulse (!) 52   Ht '5\' 9"'$  (1.753 m)   Wt 203 lb (92.1 kg)   SpO2 98%   BMI 29.98 kg/m?  ? ?Neurological Exam: ?MENTAL STATUS including orientation to time, place, person, recent and remote memory, attention span and concentration, language, and fund of knowledge is normal.  Speech is not dysarthric. ? ?CRANIAL NERVES: Normal conjugate, extra-ocular eye movements in all directions of gaze.  No ptosis bilaterally. Facial muscles 5/5.  ? 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 No atrophy, fasciculations or abnormal movements.  No pronator drift.  ? ?SENSORY:  intact to  vibration throughout ? ?REFLEXES:  Reflexes are 2+/4 throughout ? ?COORDINATION/GAIT:   Gait narrow based and stable. Easily able to stand up from chair without using arms. ? ?Data: ?MRI cervical spine without contrast 02/17/2019: ?1. 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 ?2. Solid anterior fusion at C4-5 and C5-6 without spinal stenosis at these levels. ?3. Adjacent segment degenerative changes with moderate spinal stenosis at C6-7, mild-to-moderate spinal stenosis at C3-4, and mild-to-moderate neural foraminal stenosis at both levels. ?  ?MRI lumbar spine 02/17/2019: ?1. Relatively diffuse lumbar disc and facet degeneration without significant spinal stenosis. ?2. Multilevel neural foraminal stenosis, moderate on the left at L5-S1. ?3. Normal appearance of the conus medullaris and cauda equina. ?  ?MRI thoracic spine without contrast 02/17/2019: ?1. Mild thoracic disc and facet degeneration without evidence of neural impingement. ?2. Minimal prominence of the central canal of the mid to lower thoracic spinal cord without syrinx. ? ?Left rectus femorus muscle biopsy 02/20/2019:  Active necrotizing inflammatory myopathy. ?Right chest wall skin biopsy:  Benign skin with fibrosis and mixed superficial and deep inflammation ? ?Neuromuscular Lab - [facility] 03/20/2019:  Myopathy 2 panel - HMGCR 36,000 (normal <2500) ? ?Lab Results  ?Component Value Date  ? CKTOTAL 862 (HH) 12/11/2021  ? CKMB CANCELED 03/21/2020  ? CKMBINDEX 23.6 (HH) 04/15/2021  ? TROPONINI 0.03 (HH) 02/17/2019  ? ? ?Lab Results  ?Component Value Date  ? HGBA1C 7.2 (H) 11/16/2021  ? ? ?IMPRESSION/PLAN: ?Anti-HMGR immune mediated myopathy, diagnosed 02/2019.  Managed on long course of IVIG (discontinued in April 2022), prednisone, and Cellcept (increased to '1000mg'$  BID in Feb 2022). His CK continue to fluctuate ~ 800s-1000s.  Fortunately, he is asymptomatic and exam shows no weakness.   ? - Continue prednisone '20mg'$ /d ? - Continue Cellcept '1000mg'$  BID ?- CK has been  stable (800-1000s), with no clinical weakness.  Recheck at next visit. ?- If he has any new weakness during the time when I am on leave, increase prednisone to '30mg'$ /d ? ?2.  Bilateral optic neuropathy, stable.  Previously evaluated by [place] [facility] ? ?Return to clinic in 5 months ? ?Total time spent reviewing records, interview, history/exam, documentation, and coordination of care on day of encounter:  20 min ? ? ?Thank you for allowing me to participate in patient's care.  If I can answer any additional questions, I would be pleased to do so.   ? ?Sincerely, ? ? ? ?Delcia Spitzley K. Posey Pronto, DO ?

## 2021-12-18 ENCOUNTER — Encounter: Payer: Self-pay | Admitting: Neurology

## 2021-12-18 ENCOUNTER — Other Ambulatory Visit: Payer: Self-pay | Admitting: Cardiology

## 2021-12-18 ENCOUNTER — Ambulatory Visit: Payer: Medicare Other | Admitting: Neurology

## 2021-12-18 ENCOUNTER — Other Ambulatory Visit: Payer: Self-pay

## 2021-12-18 VITALS — BP 131/72 | HR 52 | Ht 69.0 in | Wt 203.0 lb

## 2021-12-18 DIAGNOSIS — G7249 Other inflammatory and immune myopathies, not elsewhere classified: Secondary | ICD-10-CM

## 2021-12-18 DIAGNOSIS — I2510 Atherosclerotic heart disease of native coronary artery without angina pectoris: Secondary | ICD-10-CM

## 2021-12-18 MED ORDER — MYCOPHENOLATE MOFETIL 500 MG PO TABS
1000.0000 mg | ORAL_TABLET | Freq: Two times a day (BID) | ORAL | 3 refills | Status: DC
  Filled 2022-07-03: qty 360, 90d supply, fill #0
  Filled 2022-09-24: qty 360, 90d supply, fill #1

## 2021-12-18 MED ORDER — PREDNISONE 10 MG PO TABS: ORAL_TABLET | ORAL | 3 refills | Status: DC

## 2021-12-18 NOTE — Patient Instructions (Addendum)
It was great to see you today!  Have a fabulous time in [place] ? ?Return to clinic in August  ?

## 2021-12-23 ENCOUNTER — Ambulatory Visit: Payer: Medicare Other | Admitting: Student

## 2021-12-23 ENCOUNTER — Encounter: Payer: Self-pay | Admitting: Student

## 2021-12-23 ENCOUNTER — Other Ambulatory Visit: Payer: Self-pay

## 2021-12-23 VITALS — BP 114/66 | HR 56 | Temp 97.6°F | Resp 17 | Ht 69.0 in | Wt 201.4 lb

## 2021-12-23 DIAGNOSIS — I2510 Atherosclerotic heart disease of native coronary artery without angina pectoris: Secondary | ICD-10-CM

## 2021-12-23 DIAGNOSIS — R9431 Abnormal electrocardiogram [ECG] [EKG]: Secondary | ICD-10-CM

## 2021-12-23 DIAGNOSIS — E7800 Pure hypercholesterolemia, unspecified: Secondary | ICD-10-CM

## 2021-12-23 DIAGNOSIS — I480 Paroxysmal atrial fibrillation: Secondary | ICD-10-CM

## 2021-12-23 MED ORDER — EZETIMIBE 10 MG PO TABS: 10.0000 mg | ORAL_TABLET | Freq: Every day | ORAL | 3 refills | Status: DC

## 2021-12-23 MED ORDER — PRALUENT 150 MG/ML ~~LOC~~ SOAJ: SUBCUTANEOUS | 3 refills | Status: DC

## 2021-12-23 MED ORDER — VASCEPA 1 G PO CAPS: 2.0000 g | ORAL_CAPSULE | Freq: Two times a day (BID) | ORAL | 3 refills | Status: DC

## 2021-12-29 ENCOUNTER — Ambulatory Visit: Payer: Medicare Other

## 2021-12-29 ENCOUNTER — Other Ambulatory Visit: Payer: Self-pay

## 2021-12-29 DIAGNOSIS — R9431 Abnormal electrocardiogram [ECG] [EKG]: Secondary | ICD-10-CM

## 2021-12-29 DIAGNOSIS — I480 Paroxysmal atrial fibrillation: Secondary | ICD-10-CM

## 2022-01-01 ENCOUNTER — Encounter: Payer: Self-pay | Admitting: Cardiology

## 2022-01-12 NOTE — Progress Notes (Signed)
Called patient, NA, LMAM

## 2022-01-12 NOTE — Progress Notes (Signed)
Patient called back, I have discussed results with him and his wife.

## 2022-01-18 ENCOUNTER — Ambulatory Visit: Payer: Medicare Other

## 2022-01-18 DIAGNOSIS — I480 Paroxysmal atrial fibrillation: Secondary | ICD-10-CM

## 2022-01-18 DIAGNOSIS — R9431 Abnormal electrocardiogram [ECG] [EKG]: Secondary | ICD-10-CM

## 2022-01-20 NOTE — Progress Notes (Signed)
Please schedule an OV, he will need cath. I will let his son know. You can see him. This is high risk clinically.

## 2022-01-21 NOTE — Progress Notes (Signed)
Patients stress test is high risk. Please make him appt with me next week to discuss further.

## 2022-01-22 NOTE — Progress Notes (Signed)
Pt made an appt for 04/17 to see Dr. Einar Gip

## 2022-01-25 ENCOUNTER — Ambulatory Visit: Payer: Medicare Other | Admitting: Cardiology

## 2022-01-25 ENCOUNTER — Encounter: Payer: Self-pay | Admitting: Cardiology

## 2022-01-25 ENCOUNTER — Other Ambulatory Visit (INDEPENDENT_AMBULATORY_CARE_PROVIDER_SITE_OTHER): Payer: Medicare Other

## 2022-01-25 VITALS — BP 125/64 | HR 56 | Temp 97.3°F | Resp 17 | Ht 69.0 in | Wt 198.6 lb

## 2022-01-25 DIAGNOSIS — E7800 Pure hypercholesterolemia, unspecified: Secondary | ICD-10-CM | POA: Diagnosis not present

## 2022-01-25 DIAGNOSIS — R748 Abnormal levels of other serum enzymes: Secondary | ICD-10-CM

## 2022-01-25 DIAGNOSIS — R9439 Abnormal result of other cardiovascular function study: Secondary | ICD-10-CM

## 2022-01-25 DIAGNOSIS — T380X5A Adverse effect of glucocorticoids and synthetic analogues, initial encounter: Secondary | ICD-10-CM

## 2022-01-25 DIAGNOSIS — G720 Drug-induced myopathy: Secondary | ICD-10-CM

## 2022-01-25 DIAGNOSIS — R739 Hyperglycemia, unspecified: Secondary | ICD-10-CM

## 2022-01-25 DIAGNOSIS — R7989 Other specified abnormal findings of blood chemistry: Secondary | ICD-10-CM

## 2022-01-25 DIAGNOSIS — E785 Hyperlipidemia, unspecified: Secondary | ICD-10-CM | POA: Diagnosis not present

## 2022-01-25 DIAGNOSIS — I2510 Atherosclerotic heart disease of native coronary artery without angina pectoris: Secondary | ICD-10-CM

## 2022-01-25 DIAGNOSIS — E538 Deficiency of other specified B group vitamins: Secondary | ICD-10-CM

## 2022-01-25 DIAGNOSIS — G7249 Other inflammatory and immune myopathies, not elsewhere classified: Secondary | ICD-10-CM

## 2022-01-25 DIAGNOSIS — I480 Paroxysmal atrial fibrillation: Secondary | ICD-10-CM

## 2022-01-25 LAB — CBC WITH DIFFERENTIAL/PLATELET
Basophils Absolute: 0 10*3/uL (ref 0.0–0.1)
Basophils Relative: 0.6 % (ref 0.0–3.0)
Eosinophils Absolute: 0 10*3/uL (ref 0.0–0.7)
Eosinophils Relative: 0.4 % (ref 0.0–5.0)
HCT: 38.7 % — ABNORMAL LOW (ref 39.0–52.0)
Hemoglobin: 12.8 g/dL — ABNORMAL LOW (ref 13.0–17.0)
Lymphocytes Relative: 16.8 % (ref 12.0–46.0)
Lymphs Abs: 1.3 10*3/uL (ref 0.7–4.0)
MCHC: 33 g/dL (ref 30.0–36.0)
MCV: 92.4 fl (ref 78.0–100.0)
Monocytes Absolute: 0.5 10*3/uL (ref 0.1–1.0)
Monocytes Relative: 5.8 % (ref 3.0–12.0)
Neutro Abs: 6 10*3/uL (ref 1.4–7.7)
Neutrophils Relative %: 76.4 % (ref 43.0–77.0)
Platelets: 175 10*3/uL (ref 150.0–400.0)
RBC: 4.19 Mil/uL — ABNORMAL LOW (ref 4.22–5.81)
RDW: 14.4 % (ref 11.5–15.5)
WBC: 7.9 10*3/uL (ref 4.0–10.5)

## 2022-01-25 LAB — MICROALBUMIN / CREATININE URINE RATIO
Creatinine,U: 129.8 mg/dL
Microalb Creat Ratio: 6.2 mg/g (ref 0.0–30.0)
Microalb, Ur: 8 mg/dL — ABNORMAL HIGH (ref 0.0–1.9)

## 2022-01-25 LAB — COMPREHENSIVE METABOLIC PANEL
ALT: 51 U/L (ref 0–53)
AST: 33 U/L (ref 0–37)
Albumin: 4 g/dL (ref 3.5–5.2)
Alkaline Phosphatase: 39 U/L (ref 39–117)
BUN: 13 mg/dL (ref 6–23)
CO2: 29 mEq/L (ref 19–32)
Calcium: 9.5 mg/dL (ref 8.4–10.5)
Chloride: 101 mEq/L (ref 96–112)
Creatinine, Ser: 0.77 mg/dL (ref 0.40–1.50)
GFR: 91.82 mL/min (ref >60.00)
Glucose, Bld: 111 mg/dL — ABNORMAL HIGH (ref 70–99)
Potassium: 4.3 mEq/L (ref 3.5–5.1)
Sodium: 138 mEq/L (ref 135–145)
Total Bilirubin: 0.5 mg/dL (ref 0.2–1.2)
Total Protein: 6.1 g/dL (ref 6.0–8.3)

## 2022-01-25 LAB — LIPID PANEL
Cholesterol: 106 mg/dL (ref 0–200)
HDL: 54.6 mg/dL (ref >39.00)
LDL Cholesterol: 28 mg/dL (ref 0–99)
NonHDL: 51.79
Total CHOL/HDL Ratio: 2
Triglycerides: 120 mg/dL (ref 0.0–149.0)
VLDL: 24 mg/dL (ref 0.0–40.0)

## 2022-01-25 LAB — CK: Total CK: 651 U/L — ABNORMAL HIGH (ref 7–232)

## 2022-01-25 LAB — HEMOGLOBIN A1C: Hgb A1c MFr Bld: 7.1 % — ABNORMAL HIGH (ref 4.6–6.5)

## 2022-01-25 LAB — TSH: TSH: 1.53 u[IU]/mL (ref 0.35–5.50)

## 2022-01-25 LAB — VITAMIN B12: Vitamin B-12: 701 pg/mL (ref 211–911)

## 2022-01-25 NOTE — Progress Notes (Signed)
? ?Primary Physician/Referring:  Marin Olp, MD ? ?Patient ID: Stanley Boyd, male    DOB: 09/05/1951, 71 y.o.   MRN: [id number] ? ?Chief Complaint  ?Patient presents with  ? HIGH RISK STRESS TEST  ? Coronary artery disease involving native coronary artery of  ? ?HPI:   ? ?[name]  is a 71 y.o. Caucasian male with CAD, staged PCI, first on 07/08/2015 with 3.0x38 & 3.0x5m Resolute DES to prox and mid LAD, then on 07/17/2015 with stenting of the bifurcating large OM1 and proximal circumflex with implantation of 3.0 x 28 mm in the circumflex and 3.0 x 24 mm Synergy DES with [employer]. 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   ? ?Patient was initially seen on 11/30/2021 after he presented to the emergency room after cataract surgery with new onset atrial fibrillation on 11/25/2021.  He underwent nuclear stress test and presents for follow-up.  He was started on anticoagulation with Eliquis on prior visit.  Zio patch monitor revealed <1% atrial fibrillation burden, longest lasting 8 minutes during monitoring that was done on 12/01/2021. ? ?Presently asymptomatic but has noticed occasional episodes of mild diaphoresis which is his anginal equivalent.  Wife is present.  ? ?Past Medical History:  ?Diagnosis Date  ? BPH (benign prostatic hyperplasia)   ? Bradycardia   ? 40s to low 50s  ? Chronic back pain   ? multiple surgeries following fall from a deer stand  ? Coronary artery disease   ? 5 stents  ? Headache   ? "probably monthly" (07/08/2015)  ? Hyperlipidemia   ? on injectable medication  ? Kidney stones   ? "years ago; never had OR/scopes"  ? 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 Neuromuscular disorder ([place]   ? Pneumonia 08/2014  ? ? ?Social History  ? ?Tobacco Use  ? Smoking status: Never  ? Smokeless tobacco: Never   ?Substance Use Topics  ? Alcohol use: Not Currently  ? Marital Status: Married  ? ?ROS  ?Review of Systems  ?Cardiovascular:  Negative for chest pain, dyspnea on exertion and leg swelling.  ? ?Objective  ? ? ?  01/25/2022  ? 10:21 AM 12/23/2021  ?  9:02 AM 12/18/2021  ?  9:20 AM  ?Vitals with BMI  ?Height '5\' 9"'$  '5\' 9"'$  '5\' 9"'$   ?Weight 198 lbs 10 oz 201 lbs 6 oz 203 lbs  ?BMI 29.31 29.73 29.96  ?Systolic [id number] ?Diastolic 64 66 72  ?Pulse 56 56 52  ?  ?Blood pressure 125/64, pulse (!) 56, temperature (!) 97.3 ?F (36.3 ?C), temperature source Temporal, resp. rate 17, height '5\' 9"'$  (1.753 m), weight 198 lb 9.6 oz (90.1 kg), SpO2 100 %. Body mass index is 29.33 kg/m?. ?  ?Physical Exam ?Cardiovascular:  ?   Rate and Rhythm: Normal rate and regular rhythm.  ?   Pulses: Intact distal pulses.  ?   Heart sounds: Normal heart sounds. No murmur heard. ?  No gallop.  ?   Comments: No leg edema, no JVD. ?Pulmonary:  ?   Effort: Pulmonary effort is normal.  ?   Breath sounds: Normal breath sounds.  ?Musculoskeletal:  ?   Right lower leg: No edema.  ?   Left lower leg: No edema.  ?  ?Laboratory examination:  ? ?Recent Labs  ?  05/08/21 ?1008 11/16/21 ?0000 11/16/21 ?1140  ?NA 144 137 137  ?K 4.8 4.1 4.1  ?CL 100 96* 96  ?CO2 26 26* 26  ?GLUCOSE 95  --  120*  ?BUN '16 14 14  '$ ?CREATININE 0.97 0.8 0.79  ?CALCIUM 10.7*  --  10.1  ? ? ?  Latest Ref Rng & Units 11/16/2021  ? 11:40 AM 11/16/2021  ? 12:00 AM 05/08/2021  ? 10:08 AM  ?CMP  ?Glucose 70 - 99 mg/dL 120    95    ?BUN 8 - 27 mg/dL '14   14   16    '$ ?Creatinine 0.76 - 1.27 mg/dL 0.79   0.8   0.97    ?Sodium 134 - 144 mmol/L 137   137   144    ?Potassium 3.5 - 5.2 mmol/L 4.1   4.1   4.8    ?Chloride 96 - 106 mmol/L 96   96   100    ?CO2 20 - 29 mmol/L '26   26   26    '$ ?Calcium 8.6 - 10.2 mg/dL 10.1    10.7    ?Total Protein 6.0 - 8.5 g/dL 6.8    6.8    ?Total Bilirubin 0.0 - 1.2 mg/dL 0.4    0.3    ?Alkaline Phos 44 - 121 IU/L 49    44    ?AST 0 - 40 IU/L 41   41   45    ?ALT 0 - 44 IU/L  53   53   54    ? ? ?  Latest Ref Rng & Units 11/16/2021  ? 11:40 AM 11/16/2021  ? 12:00 AM 05/08/2021  ? 10:08 AM  ?CBC  ?WBC 3.4 - 10.8 x10E3/uL 9.2   9.2   8.4    ?Hemoglobin 13.0 - 17.7 g/dL 14.6   14.6   14.7    ?Hematocrit 37.5 - 51.0 % 42.7   43   45.1    ?Platelets 150 - 450 x10E3/uL 266   266   239    ? ?Lipid Panel ?Recent Labs  ?  05/08/21 ?1008  ?CHOL 133  ?TRIG 164*  ?[place] 49  ?HDL 57  ?CHOLHDL 2.3  ?  ?HEMOGLOBIN A1C ?Lab Results  ?Component Value Date  ? HGBA1C 7.2 (H) 11/16/2021  ? MPG 146 08/07/2020  ? ?TSH ?No results for input(s): TSH in the last 8760 hours. ? ?Allergies  ? ?Allergies  ?Allergen Reactions  ? Covid-19 Mrna Vacc (Moderna) Other (See Comments)  ?  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 Statins Other (See Comments)  ?  Myositis  ?  ?Final Medications at End of Visit   ? ?Current Outpatient Medications:  ?  Alirocumab (PRALUENT) 150 MG/ML SOAJ, INJECT THE CONTENTS OF 1 PEN SUBCUTANEOUSLY EVERY 2 WEEKS AS DIRECTED, Disp: 2 mL, Rfl: 3 ?  apixaban (ELIQUIS) 5 MG TABS tablet, Take 1 tablet (5 mg total) by mouth 2 (two) times daily., Disp: 60 tablet, Rfl: 3 ?  brimonidine (ALPHAGAN) 0.2 % ophthalmic solution, INSTILL 1 DROP INTO RIGHT EYE THREE TIMES DAILY, Disp: , Rfl:  ?  calcium-vitamin D (OSCAL WITH D) 500-200 MG-UNIT TABS tablet, Take 1 tablet by mouth 3 (three) times daily., Disp: 270 tablet, Rfl: 3 ?  Carboxymethylcellulose Sodium (REFRESH TEARS OP), Place 1 drop into both eyes daily as needed (dry eyes). , Disp: , Rfl:  ?  colchicine 0.6 MG tablet, Take 1 tablet (0.6 mg total) by mouth daily  as needed (psuedogout pain)., Disp: 30 tablet, Rfl: 2 ?  ezetimibe (ZETIA) 10 MG tablet, Take 1 tablet (10 mg total) by mouth daily., Disp: 30 tablet, Rfl: 3 ?  finasteride (PROSCAR) 5 MG tablet, Take 5 mg by mouth at bedtime. , Disp: , Rfl:  ?  fluticasone (FLONASE) 50 MCG/ACT nasal spray, Place 2 sprays into both nostrils daily as needed for allergies or rhinitis., Disp: , Rfl:  ?   ketorolac (ACULAR) 0.5 % ophthalmic solution, Place 1 drop into the left eye in the morning, at noon, and at bedtime., Disp: , Rfl:  ?  metFORMIN (GLUCOPHAGE) 1000 MG tablet, Take 1 tablet (1,000 mg total) by mouth 2 (two) times daily with a meal., Disp: 180 tablet, Rfl: 3 ?  mycophenolate (CELLCEPT) 500 MG tablet, Take 2 tablets (1,000 mg total) by mouth 2 (two) times daily., Disp: 360 tablet, Rfl: 3 ?  nitroGLYCERIN (NITROSTAT) 0.4 MG SL tablet, Place 1 tablet (0.4 mg total) under the tongue every 5 (five) minutes as needed for chest pain., Disp: 25 tablet, Rfl: 4 ?  pantoprazole (PROTONIX) 20 MG tablet, Take 1 tablet (20 mg total) by mouth daily., Disp: 90 tablet, Rfl: 3 ?  predniSONE (DELTASONE) 10 MG tablet, TAKE 2 TABLETS BY MOUTH ONCE DAILY WITH BREAKFAST, Disp: 180 tablet, Rfl: 3 ?  sildenafil (VIAGRA) 100 MG tablet, Take 1 tablet (100 mg total) by mouth daily as needed for erectile dysfunction., Disp: 10 tablet, Rfl: 11 ?  tamsulosin (FLOMAX) 0.4 MG CAPS capsule, Take 0.4 mg by mouth at bedtime., Disp: , Rfl:  ?  tiZANidine (ZANAFLEX) 4 MG tablet, Take 1 tablet (4 mg total) by mouth every 6 (six) hours as needed for muscle spasms., Disp: 30 tablet, Rfl: 1 ?  VASCEPA 1 g capsule, Take 2 capsules (2 g total) by mouth 2 (two) times daily., Disp: 120 capsule, Rfl: 3 ?  vitamin B-12 (CYANOCOBALAMIN) 1000 MCG tablet, Take 1,000 mcg by mouth daily., Disp: , Rfl:   ? ?Radiology   ?No results found. ? ?Cardiac Studies:  ? ?Coronary angiogram 04/16/16: Proximal Cx/OM1 bifurcating  (3.0 x 28 mm in the circumflex and 3.0 x 24 mm  Synergy DES). 07/08/2015: PCI to proximal and mid LAD with 3.0x38 and 3.5x15 mm Resolute DES (07/08/15).  Presented with near syncope Focal OM restenosis S/P Balloon PTCA ? ?Echocardiogram 04/092020 :   ?1. The left ventricle has normal systolic function, with an ejection fraction of 60-65%. The cavity size was normal. Left ventricular diastolic Doppler parameters are consistent with impaired  relaxation. ? 2. The right ventricle has normal systolc function. The cavity was normal. There is no increase in right ventricular wall thickness. ? ?Left Heart Catheterization 05/13/20:  ?Normal LV systolic functi

## 2022-01-25 NOTE — H&P (View-Only) (Signed)
? ?Primary Physician/Referring:  Marin Olp, MD ? ?Patient ID: Stanley Boyd, male    DOB: August 23, 1951, 71 y.o.   MRN: [id number] ? ?Chief Complaint  ?Patient presents with  ? HIGH RISK STRESS TEST  ? Coronary artery disease involving native coronary artery of  ? ?HPI:   ? ?Stanley Boyd  is a 71 y.o. Caucasian male with CAD, staged PCI, first on 07/08/2015 with 3.0x38 & 3.0x18m Resolute DES to prox and mid LAD, then on 07/17/2015 with stenting of the bifurcating large OM1 and proximal circumflex with implantation of 3.0 x 28 mm in the circumflex and 3.0 x 24 mm Synergy DES with [employer]. 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   ? ?Patient was initially seen on 11/30/2021 after he presented to the emergency room after cataract surgery with new onset atrial fibrillation on 11/25/2021.  He underwent nuclear stress test and presents for follow-up.  He was started on anticoagulation with Eliquis on prior visit.  Zio patch monitor revealed <1% atrial fibrillation burden, longest lasting 8 minutes during monitoring that was done on 12/01/2021. ? ?Presently asymptomatic but has noticed occasional episodes of mild diaphoresis which is his anginal equivalent.  Wife is present.  ? ?Past Medical History:  ?Diagnosis Date  ? BPH (benign prostatic hyperplasia)   ? Bradycardia   ? 40s to low 50s  ? Chronic back pain   ? multiple surgeries following fall [place] a [place]  ? Coronary artery disease   ? 5 stents  ? Headache   ? "probably monthly" (07/08/2015)  ? Hyperlipidemia   ? on injectable medication  ? Kidney stones   ? "years ago; never had OR/scopes"  ? 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 Neuromuscular disorder ([place]   ? Pneumonia 08/2014  ? ? ?Social History  ? ?Tobacco Use  ? Smoking status: Never  ? Smokeless tobacco: Never   ?Substance Use Topics  ? Alcohol use: Not Currently  ? Marital Status: Married  ? ?ROS  ?Review of Systems  ?Cardiovascular:  Negative for chest pain, dyspnea on exertion and leg swelling.  ? ?Objective  ? ? ?  01/25/2022  ? 10:21 AM 12/23/2021  ?  9:02 AM 12/18/2021  ?  9:20 AM  ?Vitals with BMI  ?Height '5\' 9"'$  '5\' 9"'$  '5\' 9"'$   ?Weight 198 lbs 10 oz 201 lbs 6 oz 203 lbs  ?BMI 29.31 29.73 29.96  ?Systolic [id number] ?Diastolic 64 66 72  ?Pulse 56 56 52  ?  ?Blood pressure 125/64, pulse (!) 56, temperature (!) 97.3 ?F (36.3 ?C), temperature source Temporal, resp. rate 17, height '5\' 9"'$  (1.753 m), weight 198 lb 9.6 oz (90.1 kg), SpO2 100 %. Body mass index is 29.33 kg/m?. ?  ?Physical Exam ?Cardiovascular:  ?   Rate and Rhythm: Normal rate and regular rhythm.  ?   Pulses: Intact distal pulses.  ?   Heart sounds: Normal heart sounds. No murmur heard. ?  No gallop.  ?   Comments: No leg edema, no JVD. ?Pulmonary:  ?   Effort: Pulmonary effort is normal.  ?   Breath sounds: Normal breath sounds.  ?Musculoskeletal:  ?   Right lower leg: No edema.  ?   Left lower leg: No edema.  ?  ?Laboratory examination:  ? ?Recent Labs  ?  05/08/21 ?1008 11/16/21 ?0000 11/16/21 ?1140  ?NA 144 137 137  ?K 4.8 4.1 4.1  ?CL 100 96* 96  ?CO2 26 26* 26  ?GLUCOSE 95  --  120*  ?BUN '16 14 14  '$ ?CREATININE 0.97 0.8 0.79  ?CALCIUM 10.7*  --  10.1  ? ? ?  Latest Ref Rng & Units 11/16/2021  ? 11:40 AM 11/16/2021  ? 12:00 AM 05/08/2021  ? 10:08 AM  ?CMP  ?Glucose 70 - 99 mg/dL 120    95    ?BUN 8 - 27 mg/dL '14   14   16    '$ ?Creatinine 0.76 - 1.27 mg/dL 0.79   0.8   0.97    ?Sodium 134 - 144 mmol/L 137   137   144    ?Potassium 3.5 - 5.2 mmol/L 4.1   4.1   4.8    ?Chloride 96 - 106 mmol/L 96   96   100    ?CO2 20 - 29 mmol/L '26   26   26    '$ ?Calcium 8.6 - 10.2 mg/dL 10.1    10.7    ?Total Protein 6.0 - 8.5 g/dL 6.8    6.8    ?Total Bilirubin 0.0 - 1.2 mg/dL 0.4    0.3    ?Alkaline Phos 44 - 121 IU/L 49    44    ?AST 0 - 40 IU/L 41   41   45    ?ALT 0 - 44 IU/L  53   53   54    ? ? ?  Latest Ref Rng & Units 11/16/2021  ? 11:40 AM 11/16/2021  ? 12:00 AM 05/08/2021  ? 10:08 AM  ?CBC  ?WBC 3.4 - 10.8 x10E3/uL 9.2   9.2   8.4    ?Hemoglobin 13.0 - 17.7 g/dL 14.6   14.6   14.7    ?Hematocrit 37.5 - 51.0 % 42.7   43   45.1    ?Platelets 150 - 450 x10E3/uL 266   266   239    ? ?Lipid Panel ?Recent Labs  ?  05/08/21 ?1008  ?CHOL 133  ?TRIG 164*  ?[place] 49  ?HDL 57  ?CHOLHDL 2.3  ?  ?HEMOGLOBIN A1C ?Lab Results  ?Component Value Date  ? HGBA1C 7.2 (H) 11/16/2021  ? MPG 146 08/07/2020  ? ?TSH ?No results for input(s): TSH in the last 8760 hours. ? ?Allergies  ? ?Allergies  ?Allergen Reactions  ? Covid-19 Mrna Vacc (Moderna) Other (See Comments)  ?  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 Statins Other (See Comments)  ?  Myositis  ?  ?Final Medications at End of Visit   ? ?Current Outpatient Medications:  ?  Alirocumab (PRALUENT) 150 MG/ML SOAJ, INJECT THE CONTENTS OF 1 PEN SUBCUTANEOUSLY EVERY 2 WEEKS AS DIRECTED, Disp: 2 mL, Rfl: 3 ?  apixaban (ELIQUIS) 5 MG TABS tablet, Take 1 tablet (5 mg total) by mouth 2 (two) times daily., Disp: 60 tablet, Rfl: 3 ?  brimonidine (ALPHAGAN) 0.2 % ophthalmic solution, INSTILL 1 DROP INTO RIGHT EYE THREE TIMES DAILY, Disp: , Rfl:  ?  calcium-vitamin D (OSCAL WITH D) 500-200 MG-UNIT TABS tablet, Take 1 tablet by mouth 3 (three) times daily., Disp: 270 tablet, Rfl: 3 ?  Carboxymethylcellulose Sodium (REFRESH TEARS OP), Place 1 drop into both eyes daily as needed (dry eyes). , Disp: , Rfl:  ?  colchicine 0.6 MG tablet, Take 1 tablet (0.6 mg total) by mouth daily  as needed (psuedogout pain)., Disp: 30 tablet, Rfl: 2 ?  ezetimibe (ZETIA) 10 MG tablet, Take 1 tablet (10 mg total) by mouth daily., Disp: 30 tablet, Rfl: 3 ?  finasteride (PROSCAR) 5 MG tablet, Take 5 mg by mouth at bedtime. , Disp: , Rfl:  ?  fluticasone (FLONASE) 50 MCG/ACT nasal spray, Place 2 sprays into both nostrils daily as needed for allergies or rhinitis., Disp: , Rfl:  ?   ketorolac (ACULAR) 0.5 % ophthalmic solution, Place 1 drop into the left eye in the morning, at noon, and at bedtime., Disp: , Rfl:  ?  metFORMIN (GLUCOPHAGE) 1000 MG tablet, Take 1 tablet (1,000 mg total) by mouth 2 (two) times daily with a meal., Disp: 180 tablet, Rfl: 3 ?  mycophenolate (CELLCEPT) 500 MG tablet, Take 2 tablets (1,000 mg total) by mouth 2 (two) times daily., Disp: 360 tablet, Rfl: 3 ?  nitroGLYCERIN (NITROSTAT) 0.4 MG SL tablet, Place 1 tablet (0.4 mg total) under the tongue every 5 (five) minutes as needed for chest pain., Disp: 25 tablet, Rfl: 4 ?  pantoprazole (PROTONIX) 20 MG tablet, Take 1 tablet (20 mg total) by mouth daily., Disp: 90 tablet, Rfl: 3 ?  predniSONE (DELTASONE) 10 MG tablet, TAKE 2 TABLETS BY MOUTH ONCE DAILY WITH BREAKFAST, Disp: 180 tablet, Rfl: 3 ?  sildenafil (VIAGRA) 100 MG tablet, Take 1 tablet (100 mg total) by mouth daily as needed for erectile dysfunction., Disp: 10 tablet, Rfl: 11 ?  tamsulosin (FLOMAX) 0.4 MG CAPS capsule, Take 0.4 mg by mouth at bedtime., Disp: , Rfl:  ?  tiZANidine (ZANAFLEX) 4 MG tablet, Take 1 tablet (4 mg total) by mouth every 6 (six) hours as needed for muscle spasms., Disp: 30 tablet, Rfl: 1 ?  VASCEPA 1 g capsule, Take 2 capsules (2 g total) by mouth 2 (two) times daily., Disp: 120 capsule, Rfl: 3 ?  vitamin B-12 (CYANOCOBALAMIN) 1000 MCG tablet, Take 1,000 mcg by mouth daily., Disp: , Rfl:   ? ?Radiology   ?No results found. ? ?Cardiac Studies:  ? ?Coronary angiogram 04/16/16: Proximal Cx/OM1 bifurcating  (3.0 x 28 mm in the circumflex and 3.0 x 24 mm  Synergy DES). 07/08/2015: PCI to proximal and mid LAD with 3.0x38 and 3.5x15 mm Resolute DES (07/08/15).  Presented with near syncope Focal OM restenosis S/P Balloon PTCA ? ?Echocardiogram 04/092020 :   ?1. The left ventricle has normal systolic function, with an ejection fraction of 60-65%. The cavity size was normal. Left ventricular diastolic Doppler parameters are consistent with impaired  relaxation. ? 2. The right ventricle has normal systolc function. The cavity was normal. There is no increase in right ventricular wall thickness. ? ?Left Heart Catheterization 05/13/20:  ?Normal LV systolic functi

## 2022-01-29 ENCOUNTER — Encounter: Payer: Self-pay | Admitting: Family Medicine

## 2022-01-29 ENCOUNTER — Other Ambulatory Visit: Payer: Self-pay

## 2022-01-29 DIAGNOSIS — D649 Anemia, unspecified: Secondary | ICD-10-CM

## 2022-02-09 ENCOUNTER — Ambulatory Visit (HOSPITAL_COMMUNITY)
Admission: RE | Admit: 2022-02-09 | Discharge: 2022-02-09 | Disposition: A | Discharge status: Home / Self Care | Payer: Medicare Other | Attending: Cardiology | Admitting: Cardiology

## 2022-02-09 ENCOUNTER — Encounter (HOSPITAL_COMMUNITY)
Admission: RE | Disposition: A | Discharge status: Home / Self Care | Payer: Self-pay | Source: Home / Self Care | Attending: Cardiology

## 2022-02-09 ENCOUNTER — Other Ambulatory Visit: Payer: Self-pay

## 2022-02-09 DIAGNOSIS — I2510 Atherosclerotic heart disease of native coronary artery without angina pectoris: Secondary | ICD-10-CM | POA: Diagnosis not present

## 2022-02-09 DIAGNOSIS — E7800 Pure hypercholesterolemia, unspecified: Secondary | ICD-10-CM | POA: Insufficient documentation

## 2022-02-09 DIAGNOSIS — E785 Hyperlipidemia, unspecified: Secondary | ICD-10-CM | POA: Diagnosis not present

## 2022-02-09 DIAGNOSIS — R9439 Abnormal result of other cardiovascular function study: Secondary | ICD-10-CM | POA: Diagnosis not present

## 2022-02-09 DIAGNOSIS — G720 Drug-induced myopathy: Secondary | ICD-10-CM | POA: Insufficient documentation

## 2022-02-09 DIAGNOSIS — Z7984 Long term (current) use of oral hypoglycemic drugs: Secondary | ICD-10-CM | POA: Diagnosis not present

## 2022-02-09 DIAGNOSIS — Z955 Presence of coronary angioplasty implant and graft: Secondary | ICD-10-CM | POA: Diagnosis not present

## 2022-02-09 DIAGNOSIS — I480 Paroxysmal atrial fibrillation: Secondary | ICD-10-CM | POA: Diagnosis not present

## 2022-02-09 DIAGNOSIS — E119 Type 2 diabetes mellitus without complications: Secondary | ICD-10-CM | POA: Insufficient documentation

## 2022-02-09 HISTORY — PX: LEFT HEART CATH AND CORONARY ANGIOGRAPHY: CATH118249

## 2022-02-09 LAB — GLUCOSE, CAPILLARY: Glucose-Capillary: 109 mg/dL — ABNORMAL HIGH (ref 70–99)

## 2022-02-09 SURGERY — LEFT HEART CATH AND CORONARY ANGIOGRAPHY
Anesthesia: LOCAL

## 2022-02-09 MED ORDER — NITROGLYCERIN 1 MG/10 ML FOR IR/CATH LAB
INTRA_ARTERIAL | Status: AC
Start: 2022-02-09 — End: ?
  Filled 2022-02-09: qty 10

## 2022-02-09 MED ORDER — LIDOCAINE HCL (PF) 1 % IJ SOLN
INTRAMUSCULAR | Status: AC
  Filled 2022-02-09: qty 30

## 2022-02-09 MED ORDER — VERAPAMIL HCL 2.5 MG/ML IV SOLN
INTRAVENOUS | Status: DC | PRN
  Administered 2022-02-09: 10 mL via INTRA_ARTERIAL

## 2022-02-09 MED ORDER — ASPIRIN 81 MG PO CHEW: 81.0000 mg | CHEWABLE_TABLET | ORAL | Status: DC

## 2022-02-09 MED ORDER — MIDAZOLAM HCL 2 MG/2ML IJ SOLN
INTRAMUSCULAR | Status: AC
  Filled 2022-02-09: qty 2

## 2022-02-09 MED ORDER — SODIUM CHLORIDE 0.9 % IV SOLN: 250.0000 mL | INTRAVENOUS | Status: DC | PRN

## 2022-02-09 MED ORDER — SODIUM CHLORIDE 0.9% FLUSH: 3.0000 mL | Freq: Two times a day (BID) | INTRAVENOUS | Status: DC

## 2022-02-09 MED ORDER — SODIUM CHLORIDE 0.9 % WEIGHT BASED INFUSION: 1.0000 mL/kg/h | INTRAVENOUS | Status: DC

## 2022-02-09 MED ORDER — HEPARIN (PORCINE) IN NACL 1000-0.9 UT/500ML-% IV SOLN
INTRAVENOUS | Status: DC | PRN
Start: 2022-02-09 — End: 2022-02-09
  Administered 2022-02-09 (×2): 500 mL

## 2022-02-09 MED ORDER — MIDAZOLAM HCL 2 MG/2ML IJ SOLN
INTRAMUSCULAR | Status: DC | PRN
Start: 2022-02-09 — End: 2022-02-09
  Administered 2022-02-09: 2 mg via INTRAVENOUS

## 2022-02-09 MED ORDER — LIDOCAINE HCL (PF) 1 % IJ SOLN
INTRAMUSCULAR | Status: DC | PRN
  Administered 2022-02-09: 2 mL

## 2022-02-09 MED ORDER — HEPARIN (PORCINE) IN NACL 1000-0.9 UT/500ML-% IV SOLN
INTRAVENOUS | Status: AC
Start: 2022-02-09 — End: ?
  Filled 2022-02-09: qty 1000

## 2022-02-09 MED ORDER — VERAPAMIL HCL 2.5 MG/ML IV SOLN
INTRAVENOUS | Status: AC
Start: 2022-02-09 — End: ?
  Filled 2022-02-09: qty 2

## 2022-02-09 MED ORDER — ASPIRIN 81 MG PO CHEW
81.0000 mg | CHEWABLE_TABLET | ORAL | Status: AC
  Administered 2022-02-09: 81 mg via ORAL
  Filled 2022-02-09: qty 1

## 2022-02-09 MED ORDER — FENTANYL CITRATE (PF) 100 MCG/2ML IJ SOLN
INTRAMUSCULAR | Status: AC
Start: 2022-02-09 — End: ?
  Filled 2022-02-09: qty 2

## 2022-02-09 MED ORDER — IOHEXOL 350 MG/ML SOLN
INTRAVENOUS | Status: DC | PRN
  Administered 2022-02-09: 40 mL

## 2022-02-09 MED ORDER — FENTANYL CITRATE (PF) 100 MCG/2ML IJ SOLN
INTRAMUSCULAR | Status: DC | PRN
  Administered 2022-02-09: 25 ug via INTRAVENOUS

## 2022-02-09 MED ORDER — HEPARIN SODIUM (PORCINE) 1000 UNIT/ML IJ SOLN
INTRAMUSCULAR | Status: DC | PRN
Start: 2022-02-09 — End: 2022-02-09
  Administered 2022-02-09: 4500 [IU] via INTRAVENOUS

## 2022-02-09 MED ORDER — SODIUM CHLORIDE 0.9 % WEIGHT BASED INFUSION
3.0000 mL/kg/h | INTRAVENOUS | Status: AC
  Administered 2022-02-09: 3 mL/kg/h via INTRAVENOUS

## 2022-02-09 MED ORDER — HEPARIN SODIUM (PORCINE) 1000 UNIT/ML IJ SOLN
INTRAMUSCULAR | Status: AC
  Filled 2022-02-09: qty 10

## 2022-02-09 MED ORDER — NITROGLYCERIN 1 MG/10 ML FOR IR/CATH LAB
INTRA_ARTERIAL | Status: DC | PRN
  Administered 2022-02-09: 200 ug via INTRACORONARY

## 2022-02-09 MED ORDER — SODIUM CHLORIDE 0.9% FLUSH: 3.0000 mL | INTRAVENOUS | Status: DC | PRN

## 2022-02-09 MED ORDER — SODIUM CHLORIDE 0.9 % WEIGHT BASED INFUSION: 3.0000 mL/kg/h | INTRAVENOUS | Status: DC

## 2022-02-09 MED ORDER — APIXABAN 5 MG PO TABS: 5.0000 mg | ORAL_TABLET | Freq: Two times a day (BID) | ORAL | 3 refills | Status: DC

## 2022-02-09 SURGICAL SUPPLY — 9 items
CATH OPTITORQUE TIG 4.0 5F (CATHETERS) ×1 IMPLANT
DEVICE RAD COMP TR BAND LRG (VASCULAR PRODUCTS) ×1 IMPLANT
GLIDESHEATH SLEND A-KIT 6F 22G (SHEATH) ×1 IMPLANT
GUIDEWIRE INQWIRE 1.5J.035X260 (WIRE) IMPLANT
INQWIRE 1.5J .035X260CM (WIRE) ×2
KIT HEART LEFT (KITS) ×2 IMPLANT
PACK CARDIAC CATHETERIZATION (CUSTOM PROCEDURE TRAY) ×2 IMPLANT
TRANSDUCER W/STOPCOCK (MISCELLANEOUS) ×2 IMPLANT
TUBING CIL FLEX 10 FLL-RA (TUBING) ×2 IMPLANT

## 2022-02-09 NOTE — Discharge Instructions (Signed)

## 2022-02-09 NOTE — Interval H&P Note (Signed)
History and Physical Interval Note: ? ?02/09/2022 ?2:21 PM ? ?[name]  has presented today for surgery, with the diagnosis of abnormal stress test.  The various methods of treatment have been discussed with the patient and family. After consideration of risks, benefits and other options for treatment, the patient has consented to  Procedure(s): ?LEFT HEART CATH AND CORONARY ANGIOGRAPHY (N/A) and possible angioplasty as a surgical intervention.  The patient's history has been reviewed, patient examined, no change in status, stable for surgery.  I have reviewed the patient's chart and labs.  Questions were answered to the patient's satisfaction.   ?Cath Lab Visit (complete for each Cath Lab visit) ? ?Clinical Evaluation Leading to the Procedure:  ? ?ACS: No. ? ?Non-ACS:   ? ?Anginal Classification: CCS III ? ?Anti-ischemic medical therapy: Maximal Therapy (2 or more classes of medications) ? ?Non-Invasive Test Results: No non-invasive testing performed ? ?Prior CABG: No previous CABG ? ? ? ?Adrian Prows ? ? ?

## 2022-02-09 NOTE — Progress Notes (Signed)
Patient and wife was given discharge instructions. Both verbalized understanding. 

## 2022-02-10 ENCOUNTER — Encounter (HOSPITAL_COMMUNITY): Payer: Self-pay | Admitting: Cardiology

## 2022-02-18 NOTE — Progress Notes (Signed)
? ?  I, Peterson Lombard, LAT, ATC acting as a scribe for [name], MD. ? ?Stanley Boyd is a 71 y.o. male who presents to [place] [facility] today for continued neck pain. 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 Pt was last seen by Dr. Georgina Snell on 08/14/20 and was given a R knee steroid injection and prior to that on 07/09/21 for neck pain. Today, pt reports neck started bothering him about 1 month ago. Pt has an upcoming [place] cruise leaving on 6/9. Pt locates pain to the L side of his neck. No numbness/tingling nor radiating pain. ?In addition to his recent heart cath he also has had recent cataract surgery. ? ?Dx imaging: 08/14/20 R knee XR ? ?Pertinent review of systems: No fevers or chills ? ?Relevant historical information: CAD and A-fib. ? ? ?Exam:  ?BP 126/78   Pulse 62   Ht '5\' 9"'$  (1.753 m)   Wt 191 lb (86.6 kg)   SpO2 98%   BMI 28.21 kg/m?  ?General: Well Developed, well nourished, and in no acute distress.  ? ?MSK: C-spine: Normal. ?Nontender to midline.  Tender palpation left cervical paraspinal musculature. ?Decreased cervical motion. ?Upper extremity strength is intact. ?Reflexes are intact. ?Right arm significant bruising forearm and upper arm. ? ? ? ? ?Assessment and Plan: ?71 y.o. male with acute neck pain.  This is thought to be an exacerbation of chronic cervical pain due to muscle spasm and dysfunction.  He has had several procedures that required an awkward neck positioning that I think have exacerbated his neck pain.  He is a good candidate for physical therapy.  Plan to refer to PT right now.  He has a important trip coming up in 1 month.  Will prescribe some medicines for now and in the future.  We will use a muscle relaxer tizanidine as needed and have a backup plan for hydrocodone.  Certainly we can refill these medicines before his trip or even while he is  traveling if needed. ? ? ?PDMP reviewed during this encounter. ?Orders Placed This Encounter  ?Procedures  ? Ambulatory referral to Physical Therapy  ?  Referral Priority:   Routine  ?  Referral Type:   Physical Medicine  ?  Referral Reason:   Specialty Services Required  ?  Requested Specialty:   Physical Therapy  ?  Number of Visits Requested:   1  ? ?Meds ordered this encounter  ?Medications  ? tiZANidine (ZANAFLEX) 4 MG tablet  ?  Sig: Take 1 tablet (4 mg total) by mouth every 6 (six) hours as needed for muscle spasms.  ?  Dispense:  30 tablet  ?  Refill:  1  ? HYDROcodone-acetaminophen (NORCO/VICODIN) 5-325 MG tablet  ?  Sig: Take 1 tablet by mouth every 6 (six) hours as needed.  ?  Dispense:  15 tablet  ?  Refill:  0  ? ? ? ?Discussed warning signs or symptoms. Please see discharge instructions. Patient expresses understanding. ? ? ?The above documentation has been reviewed and is accurate and complete [name], M.D. ? ? ?

## 2022-02-19 ENCOUNTER — Ambulatory Visit: Payer: Medicare Other | Admitting: Family Medicine

## 2022-02-19 VITALS — BP 126/78 | HR 62 | Ht 69.0 in | Wt 191.0 lb

## 2022-02-19 DIAGNOSIS — M542 Cervicalgia: Secondary | ICD-10-CM | POA: Diagnosis not present

## 2022-02-19 DIAGNOSIS — M62838 Other muscle spasm: Secondary | ICD-10-CM

## 2022-02-19 MED ORDER — HYDROCODONE-ACETAMINOPHEN 5-325 MG PO TABS
1.0000 | ORAL_TABLET | Freq: Four times a day (QID) | ORAL | 0 refills | Status: DC | PRN
Start: 2022-02-19 — End: 2022-05-21

## 2022-02-19 MED ORDER — TIZANIDINE HCL 4 MG PO TABS
4.0000 mg | ORAL_TABLET | Freq: Four times a day (QID) | ORAL | 1 refills | Status: DC | PRN
Start: 2022-02-19 — End: 2022-05-21

## 2022-02-19 NOTE — Patient Instructions (Signed)
Thank you for coming in today.  ? ?I've referred you to Physical Therapy.  Let us know if you don't hear from them in one week.  ? ?Recheck as needed ?

## 2022-02-25 ENCOUNTER — Other Ambulatory Visit (INDEPENDENT_AMBULATORY_CARE_PROVIDER_SITE_OTHER): Payer: Medicare Other

## 2022-02-25 ENCOUNTER — Encounter: Payer: Self-pay | Admitting: Cardiology

## 2022-02-25 ENCOUNTER — Ambulatory Visit: Payer: Medicare Other | Admitting: Cardiology

## 2022-02-25 ENCOUNTER — Ambulatory Visit: Payer: Medicare Other | Admitting: Family Medicine

## 2022-02-25 ENCOUNTER — Other Ambulatory Visit: Payer: Self-pay

## 2022-02-25 VITALS — BP 128/74 | HR 84 | Temp 98.2°F | Resp 16 | Ht 69.0 in | Wt 202.8 lb

## 2022-02-25 DIAGNOSIS — D649 Anemia, unspecified: Secondary | ICD-10-CM

## 2022-02-25 DIAGNOSIS — I2510 Atherosclerotic heart disease of native coronary artery without angina pectoris: Secondary | ICD-10-CM

## 2022-02-25 DIAGNOSIS — I480 Paroxysmal atrial fibrillation: Secondary | ICD-10-CM

## 2022-02-25 DIAGNOSIS — E119 Type 2 diabetes mellitus without complications: Secondary | ICD-10-CM

## 2022-02-25 LAB — CBC WITH DIFFERENTIAL/PLATELET
Basophils Absolute: 0.1 10*3/uL (ref 0.0–0.1)
Basophils Relative: 0.9 % (ref 0.0–3.0)
Eosinophils Absolute: 0.2 10*3/uL (ref 0.0–0.7)
Eosinophils Relative: 1.8 % (ref 0.0–5.0)
HCT: 38 % — ABNORMAL LOW (ref 39.0–52.0)
Hemoglobin: 12.8 g/dL — ABNORMAL LOW (ref 13.0–17.0)
Lymphocytes Relative: 31 % (ref 12.0–46.0)
Lymphs Abs: 2.8 10*3/uL (ref 0.7–4.0)
MCHC: 33.6 g/dL (ref 30.0–36.0)
MCV: 92.2 fl (ref 78.0–100.0)
Monocytes Absolute: 0.6 10*3/uL (ref 0.1–1.0)
Monocytes Relative: 6.5 % (ref 3.0–12.0)
Neutro Abs: 5.5 10*3/uL (ref 1.4–7.7)
Neutrophils Relative %: 59.8 % (ref 43.0–77.0)
Platelets: 196 10*3/uL (ref 150.0–400.0)
RBC: 4.12 Mil/uL — ABNORMAL LOW (ref 4.22–5.81)
RDW: 14.7 % (ref 11.5–15.5)
WBC: 9.1 10*3/uL (ref 4.0–10.5)

## 2022-02-25 MED ORDER — EZETIMIBE 10 MG PO TABS
10.0000 mg | ORAL_TABLET | Freq: Every day | ORAL | 3 refills | Status: DC
  Filled 2022-09-14: qty 90, 90d supply, fill #0
  Filled 2022-12-09: qty 90, 90d supply, fill #1

## 2022-02-25 MED ORDER — APIXABAN 5 MG PO TABS: 5.0000 mg | ORAL_TABLET | Freq: Two times a day (BID) | ORAL | 3 refills | Status: DC
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
## 2022-03-01 ENCOUNTER — Other Ambulatory Visit: Payer: Self-pay

## 2022-03-01 ENCOUNTER — Encounter: Payer: Self-pay | Admitting: Cardiology

## 2022-03-01 DIAGNOSIS — I480 Paroxysmal atrial fibrillation: Secondary | ICD-10-CM

## 2022-03-01 MED ORDER — APIXABAN 5 MG PO TABS: 5.0000 mg | ORAL_TABLET | Freq: Two times a day (BID) | ORAL | 3 refills | Status: DC

## 2022-03-09 ENCOUNTER — Other Ambulatory Visit: Payer: Self-pay | Admitting: Student

## 2022-03-09 DIAGNOSIS — I480 Paroxysmal atrial fibrillation: Secondary | ICD-10-CM

## 2022-04-02 ENCOUNTER — Other Ambulatory Visit: Payer: Self-pay | Admitting: Cardiology

## 2022-04-02 DIAGNOSIS — I480 Paroxysmal atrial fibrillation: Secondary | ICD-10-CM

## 2022-04-08 ENCOUNTER — Ambulatory Visit: Payer: Medicare Other

## 2022-04-22 ENCOUNTER — Ambulatory Visit: Payer: Medicare Other

## 2022-04-26 ENCOUNTER — Other Ambulatory Visit: Payer: Self-pay | Admitting: Student

## 2022-04-26 DIAGNOSIS — I2510 Atherosclerotic heart disease of native coronary artery without angina pectoris: Secondary | ICD-10-CM

## 2022-04-28 ENCOUNTER — Other Ambulatory Visit: Payer: Self-pay | Admitting: Cardiology

## 2022-04-28 ENCOUNTER — Other Ambulatory Visit: Payer: Self-pay | Admitting: Student

## 2022-04-28 DIAGNOSIS — I480 Paroxysmal atrial fibrillation: Secondary | ICD-10-CM

## 2022-04-28 DIAGNOSIS — E7800 Pure hypercholesterolemia, unspecified: Secondary | ICD-10-CM

## 2022-04-28 DIAGNOSIS — I2510 Atherosclerotic heart disease of native coronary artery without angina pectoris: Secondary | ICD-10-CM

## 2022-05-21 ENCOUNTER — Encounter: Payer: Self-pay | Admitting: Family Medicine

## 2022-05-21 ENCOUNTER — Ambulatory Visit (INDEPENDENT_AMBULATORY_CARE_PROVIDER_SITE_OTHER): Payer: Medicare Other | Admitting: Family Medicine

## 2022-05-21 VITALS — BP 100/58 | HR 52 | Temp 98.0°F | Ht 69.0 in | Wt 199.4 lb

## 2022-05-21 DIAGNOSIS — G7249 Other inflammatory and immune myopathies, not elsewhere classified: Secondary | ICD-10-CM

## 2022-05-21 DIAGNOSIS — R739 Hyperglycemia, unspecified: Secondary | ICD-10-CM | POA: Diagnosis not present

## 2022-05-21 DIAGNOSIS — Z0000 Encounter for general adult medical examination without abnormal findings: Secondary | ICD-10-CM

## 2022-05-21 DIAGNOSIS — T380X5A Adverse effect of glucocorticoids and synthetic analogues, initial encounter: Secondary | ICD-10-CM | POA: Diagnosis not present

## 2022-05-21 LAB — COMPREHENSIVE METABOLIC PANEL
ALT: 43 U/L (ref 0–53)
AST: 29 U/L (ref 0–37)
Albumin: 4.3 g/dL (ref 3.5–5.2)
Alkaline Phosphatase: 39 U/L (ref 39–117)
BUN: 13 mg/dL (ref 6–23)
CO2: 30 mEq/L (ref 19–32)
Calcium: 9.6 mg/dL (ref 8.4–10.5)
Chloride: 100 mEq/L (ref 96–112)
Creatinine, Ser: 0.88 mg/dL (ref 0.40–1.50)
GFR: 88 mL/min (ref >60.00)
Glucose, Bld: 136 mg/dL — ABNORMAL HIGH (ref 70–99)
Potassium: 4.3 mEq/L (ref 3.5–5.1)
Sodium: 137 mEq/L (ref 135–145)
Total Bilirubin: 0.4 mg/dL (ref 0.2–1.2)
Total Protein: 6.4 g/dL (ref 6.0–8.3)

## 2022-05-21 LAB — CBC WITH DIFFERENTIAL/PLATELET
Basophils Absolute: 0 10*3/uL (ref 0.0–0.1)
Basophils Relative: 0.5 % (ref 0.0–3.0)
Eosinophils Absolute: 0 10*3/uL (ref 0.0–0.7)
Eosinophils Relative: 0.4 % (ref 0.0–5.0)
HCT: 40.3 % (ref 39.0–52.0)
Hemoglobin: 13.2 g/dL (ref 13.0–17.0)
Lymphocytes Relative: 18.9 % (ref 12.0–46.0)
Lymphs Abs: 1.5 10*3/uL (ref 0.7–4.0)
MCHC: 32.6 g/dL (ref 30.0–36.0)
MCV: 91.9 fl (ref 78.0–100.0)
Monocytes Absolute: 0.4 10*3/uL (ref 0.1–1.0)
Monocytes Relative: 5.2 % (ref 3.0–12.0)
Neutro Abs: 5.9 10*3/uL (ref 1.4–7.7)
Neutrophils Relative %: 75 % (ref 43.0–77.0)
Platelets: 183 10*3/uL (ref 150.0–400.0)
RBC: 4.39 Mil/uL (ref 4.22–5.81)
RDW: 14.5 % (ref 11.5–15.5)
WBC: 7.9 10*3/uL (ref 4.0–10.5)

## 2022-05-21 LAB — HEMOGLOBIN A1C: Hgb A1c MFr Bld: 7.7 % — ABNORMAL HIGH (ref 4.6–6.5)

## 2022-05-21 LAB — CK: Total CK: 501 U/L — ABNORMAL HIGH (ref 7–232)

## 2022-05-21 NOTE — Progress Notes (Signed)
Phone: [phone]   Subjective:  Patient presents today for their annual physical. Chief complaint-noted.   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   Neuromuscular disorder ([place])    Pneumonia 08/2014   Patient Active Problem List   Diagnosis Date Noted   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    Priority: High   Steroid-induced hyperglycemia 03/23/2019    Priority: High   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    Priority: High   CAD (coronary artery disease), native coronary artery 07/13/2015    Priority: High   S/P PTCA (percutaneous transluminal coronary angioplasty) 07/08/2015    Priority: High   Elevated PSA 02/25/2017    Priority: Medium    Hemiparesis ([place]) 09/18/2014    Priority: Medium    BPH (benign prostatic hyperplasia) 09/18/2014    Priority: Medium    Hyperlipidemia     Priority: Medium    Bradycardia     Priority: Medium    Polyp of sigmoid colon     Priority: Low   B12 deficiency 02/26/2019    Priority: Low   History of colonic polyps 10/15/2014    Priority: Low   Transaminitis 09/18/2014    Priority: Low   Ptosis of eyelid, right 10/02/2020   Other optic atrophy, 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   Peripheral vision loss, bilateral 05/26/2020   Unspecified papilledema 05/26/2020   Vision changes 02/12/2020   Proximal muscle weakness 02/26/2019   Past Surgical History:  Procedure Laterality Date    ANTERIOR CERVICAL DECOMP/DISCECTOMY FUSION  70; 30; [facility]  2017   CARDIAC CATHETERIZATION N/A 07/08/2015   Procedure: Left Heart Cath and Coronary Angiography;  Surgeon: Adrian Prows, MD;  Location: [facility];  Service: Cardiovascular;  Laterality: N/A;   CARDIAC CATHETERIZATION N/A 07/17/2015   Procedure: Coronary Stent Intervention;  Surgeon: Adrian Prows, MD;  Location: [facility];  Service: Cardiovascular;  Laterality: N/A;   CARDIAC CATHETERIZATION N/A 04/16/2016   Procedure: Left Heart Cath and Coronary Angiography;  Surgeon: Adrian Prows, MD;  Location: [facility];  Service: Cardiovascular;  Laterality: N/A;   CARDIAC CATHETERIZATION N/A 04/16/2016   Procedure: Coronary Stent Intervention;  Surgeon: Adrian Prows, MD;  Location: [facility];  Service: Cardiovascular;  Laterality: N/A;   CATARACT EXTRACTION  11/2021   COLONOSCOPY N/A 04/05/2014   Procedure: COLONOSCOPY;  Surgeon: Danie Binder, MD;  [facility];  Service: Endoscopy;  Laterality: N/A;  10:30 AM   COLONOSCOPY N/A 01/25/2020   Procedure: COLONOSCOPY;  Surgeon: Danie Binder, MD;  [facility];  Service: Endoscopy;  Laterality: N/A;  9:30   CORONARY ANGIOPLASTY WITH STENT PLACEMENT  07/08/2015   "2 stents"   CORONARY STENT PLACEMENT  04/16/2016   PTCA and stenting of the ostial LAD with  implantation of a 3.0 x 12 mm resolute integrity DES, stenosis reduced from 80% to 0% with maintenance of TIMI-3 flow. Ostial OM1 in-stent restenosis reduced to less than 20% with a 2.0 x 12 mm emerge balloon at 14 atmospheric pressure   LEFT HEART CATH AND CORONARY ANGIOGRAPHY N/A 05/13/2020   Procedure: LEFT HEART CATH AND CORONARY ANGIOGRAPHY;  Surgeon: Adrian Prows, MD;  Location: [facility];  Service: Cardiovascular;  Laterality: N/A;   LEFT HEART CATH AND CORONARY ANGIOGRAPHY N/A 02/09/2022   Procedure: LEFT HEART CATH AND  CORONARY ANGIOGRAPHY;  Surgeon: Adrian Prows, MD;  Location: [facility];  Service: Cardiovascular;  Laterality: N/A;   LIVER BIOPSY  03/2019   LUMBAR DISC SURGERY  1984   MUSCLE BIOPSY N/A 02/20/2019   Procedure: LEFT QUADRICEP MUSCLE  BIOPSY AND PUNCH BIOPSY OF RIGHT CHEST;  Surgeon: Ileana Roup, MD;  Location: [facility];  Service: General;  Laterality: N/A;   POLYPECTOMY  01/25/2020   Procedure: POLYPECTOMY;  Surgeon: Danie Binder, MD;  [facility];  Service: Endoscopy;;  sigmoid   PTCA  07/17/2015   OMI    DES    Family History  Problem Relation Age of Onset   Alzheimer's disease Mother    Colon cancer Other        grandmother   Breast cancer Sister    Cancer Sister     Medications- reviewed and updated Current Outpatient Medications  Medication Sig Dispense Refill   Alirocumab (PRALUENT) 150 MG/ML SOAJ INJECT THE CONTENTS OF 1 PEN EVERY 2 WEEKS AS DIRECTED 2 mL 0   calcium-vitamin D (OSCAL WITH D) 500-200 MG-UNIT TABS tablet Take 1 tablet by mouth 3 (three) times daily. 270 tablet 3   Carboxymethylcellulose Sodium (REFRESH TEARS OP) Place 1 drop into both eyes daily as needed (dry eyes).      colchicine 0.6 MG tablet Take 1 tablet (0.6 mg total) by mouth daily as needed (psuedogout pain). 30 tablet 2   ELIQUIS 5 MG TABS tablet Take 1 tablet by mouth twice daily 60 tablet 0   ezetimibe (ZETIA) 10 MG tablet Take 1 tablet (10 mg total) by mouth daily. 90 tablet 3   finasteride (PROSCAR) 5 MG tablet Take 5 mg by mouth at bedtime.      fluticasone (FLONASE) 50 MCG/ACT nasal spray Place 2 sprays into both nostrils daily as needed for allergies or rhinitis.     metFORMIN (GLUCOPHAGE) 1000 MG tablet Take 1 tablet (1,000 mg total) by mouth 2 (two) times daily with a meal. 180 tablet 3   mycophenolate (CELLCEPT) 500 MG tablet Take 2 tablets (1,000 mg total) by mouth 2 (two) times daily. 360 tablet 3   nitroGLYCERIN (NITROSTAT) 0.4 MG SL tablet Place 1 tablet (0.4  mg total) under the tongue every 5 (five) minutes as needed for chest pain. 25 tablet 4   pantoprazole (PROTONIX) 20 MG tablet Take 1 tablet (20 mg total) by mouth daily. 90 tablet 3   predniSONE (DELTASONE) 10 MG tablet TAKE 2 TABLETS BY MOUTH ONCE DAILY WITH BREAKFAST 180 tablet 3   sildenafil (VIAGRA) 100 MG tablet Take 1 tablet (100 mg total) by mouth daily as needed for erectile dysfunction. 10 tablet 11   tamsulosin (FLOMAX) 0.4 MG CAPS capsule Take 0.4 mg by mouth at bedtime.     VASCEPA 1 g capsule Take 2 capsules by mouth twice daily 120 capsule 0   vitamin B-12 (CYANOCOBALAMIN) 1000 MCG tablet  Take 1,000 mcg by mouth daily.     No current facility-administered medications for this visit.    Allergies-reviewed and updated Allergies  Allergen Reactions   Covid-19 Mrna Vacc (Moderna) Other (See Comments)    Optic neuropathy after original 2 vaccines    Statins Other (See Comments)    Myositis    Social History   Social History Narrative   Married. 2 children (Jayce Fulton, DO of [place] [facility])      Retired/disability after accident in 2000.       2 story home   Right handed   Objective  Objective:  BP (!) 100/58   Pulse (!) 52   Temp 98 F (36.7 C)   Ht '5\' 9"'$  (1.753 m)   Wt 199 lb 6.4 oz (90.4 kg)   SpO2 97%   BMI 29.45 kg/m  Gen: NAD, resting comfortably HEENT: Mucous membranes are moist. Oropharynx normal Neck: no thyromegaly CV: RRR no murmurs rubs or gallops Lungs: CTAB no crackles, wheeze, rhonchi Abdomen: soft/nontender/nondistended/normal bowel sounds. No rebound or guarding.  Ext: no edema Skin: warm, dry Neuro: grossly Rubinas right-sided hemiparesis noted, moves all extremities, PERRLA    Assessment and Plan  71 y.o. male presenting for annual physical.  Health Maintenance counseling: 1. Anticipatory guidance: Patient counseled regarding regular dental exams -q3 months including gum checks, eye exams - at least yearly,  avoiding smoking and  second hand smoke, limiting alcohol to 2 beverages per day - doesn't drink, no illicit drugs.   2. Risk factor reduction:  Advised patient of need for regular exercise and diet rich and fruits and vegetables to reduce risk of heart attack and stroke.  Exercise- remaining active outside. Hoping to hunt next year Diet/weight management-up 5 lbs in last year- hanging around 200- reasonably health diet.  Wt Readings from Last 3 Encounters:  05/21/22 199 lb 6.4 oz (90.4 kg)  02/25/22 202 lb 12.8 oz (92 kg)  02/19/22 191 lb (86.6 kg)  3. Immunizations/screenings/ancillary studies- shingrix at pharmacy . Prevnar 20 recommended - opts out given prior reactions plus all he has been through lately at least until next year. Flu shot - holding of ffo rnow.  Immunization History  Administered Date(s) Administered   Fluad Quad(high Dose 65+) 06/28/2019, 08/07/2020, 08/19/2021   Influenza,inj,Quad PF,6+ Mos 08/12/2014, 06/26/2015   Influenza-Unspecified 08/26/2016   Moderna Sars-Covid-2 Vaccination 11/07/2019, 12/05/2019   Pneumococcal Conjugate-13 08/24/2015   Pneumococcal Polysaccharide-23 12/03/2016   Tdap 08/28/2009   Zoster, Live 06/07/2013  4. Prostate cancer screening- follows with urology for elevated PSA in February- on finasteride and flomax  5. Colon cancer screening - 01/25/20 with 5 year repeat 6. Skin cancer screening- dermatology this month. advised regular sunscreen use. Denies worrisome, changing, or new skin lesions.  7. Smoking associated screening (lung cancer screening, AAA screen 65-75, UA)- never smoker 8. STD screening - only active with wife  Status of chronic or acute concerns   #social update- [place] in June  #eye lift and then cataract surgery earlier this year- unfortunately cardiac issue/A-fib after the cataract surgery-extensive cardiac workup afterwards  # Autoimmune necrotizing myopathy S:Medication: CellCept 1000 mg twice daily.  No longer on IVIG.  Prednisone 20 mg  daily Lab Results  Component Value Date   CKTOTAL 651 (H) 01/25/2022   CKMB CANCELED 03/21/2020   CKMBINDEX 23.6 (HH) 04/15/2021   TROPONINI 0.03 (HH) 02/17/2019  A/P: Thankfully stable lately-we will update CK level with labs today-continues to follow with neurology  # Optic  neuropathy bilateral eyes S:Originally cared for by [facility] and later transitioned to [facility] ophthalmology.  Required high-dose steroids-thought potentially related to [facility] vaccination with severe symptoms around time of vaccination.  We are avoiding further COVID-19 vaccination as a result A/P: Doing well recently-continue follow-up with ophthalmology   # Hypercalcemia-reduced calcium intake last year-update calcium level  # Steroid-induced hyperglycemia S: Medication:Metformin 1000 mg twice daily Lab Results  Component Value Date   HGBA1C 7.1 (H) 01/25/2022   HGBA1C 7.2 (H) 11/16/2021   HGBA1C 7.2 11/16/2021    A/P: Mild poor control-update A1c with labs  # CAD  #hyperlipidemia S: Medication:now on eliquis 5 mg BID and off Aspirin 81 mg, Vascepa, Praluent 150 mg every 2 weeks, Zetia 10 mg  From Dr. Irven Shelling last note "Due to new onset atrial fibrillation, abnormal EKG response to stress test and mildly reduced LVEF, he underwent cardiac catheterization on 02/09/2022 revealing widely patent previously placed stents from 2016 in both LAD and circumflex/OM T stent.  He now presents for follow-up, states that he is doing well."  Since cath- No chest pain or shortness of breath.  No fatigue or hot sweats (how he originally presented).  Follows with Dr. Einar Gip- usually in august but pushed to may now after recent issues. Did have some dizziness after riding car to [place] then getting up but otherwise doing [place]  Lab Results  Component Value Date   CHOL 106 01/25/2022   HDL 54.60 01/25/2022   LDLCALC 28 01/25/2022   LDLDIRECT 58 02/26/2020   TRIG 120.0 01/25/2022   CHOLHDL 2 01/25/2022   A/P: CAD  asymptomatic-continue current medications-thankful for reassuring catheterization- -Mild LFT elevations in the past-update with labs  # Atrial fibrillation S: Rate controlled without medicine Anticoagulated with eliquis - he cant tell when he is in a fib A/P: Appropriately anticoagulated-rate controlled without medications  #neck pain better with PT- not needing hydrocodone but has a few left if needed. Still doing old exercises. Som3 old zanaflex as well available  # B12 deficiency S: Current treatment/medication (oral vs. IM): 1000 mcg daily Lab Results  Component Value Date   VITAMINB12 701 01/25/2022   A/P: Controlled on oral B12-continue current medication  # Elevated TSH-well-controlled on last check-continue to monitor at least every 6 to 12 months Lab Results  Component Value Date   TSH 1.53 01/25/2022   # Hemiparesis right side from prior stroke-noted but stable   # Erectile dysfunction-did well on viagra taking finasteride which seem to cause some of the issues  #GERD-  on pantoprazole 20 mg- trialed off in past but needed to restarted   Recommended follow up: Return in about 6 months (around 11/21/2022) for followup or sooner if needed.Schedule b4 you leave. Future Appointments  Date Time Provider [place]  05/28/2022  9:30 AM Narda Amber K, DO [facility] None  11/19/2022 11:00 AM Marin Olp, MD [facility]  02/25/2023 11:00 AM Adrian Prows, MD [facility] None   Lab/Order associations:NOT  fasting   ICD-10-CM   1. Preventative health care  Z00.00     2. Steroid-induced hyperglycemia  R73.9 CBC with Differential/Platelet   T38.0X5A Comprehensive metabolic panel    Hemoglobin A1c    3. Autoimmune necrotizing myopathy  G72.49 CK (Creatine Kinase)      No orders of the defined types were placed in this encounter.   Return precautions advised.  Garret Reddish, MD

## 2022-05-21 NOTE — Patient Instructions (Addendum)
Flu shot- we should have these available within a month or two but please let us know if you get at outside pharmacy  Please stop by lab before you go If you have mychart- we will send your results within 3 business days of [place] receiving them.  If you do not have mychart- we will call you about results within 5 business days of [place] receiving them.  *please also note that you will see labs on mychart as soon as they post. I will later go in and write notes on them- will say "notes from Dr. Yong Channel"   No changes today  Recommended follow up: Return in about 6 months (around 11/21/2022) for followup or sooner if needed.Schedule b4 you leave.

## 2022-05-24 ENCOUNTER — Other Ambulatory Visit: Payer: Self-pay

## 2022-05-24 MED ORDER — EMPAGLIFLOZIN 10 MG PO TABS
10.0000 mg | ORAL_TABLET | Freq: Every day | ORAL | 5 refills | Status: DC
  Filled 2022-07-11: qty 30, 30d supply, fill #0
  Filled 2022-08-08: qty 30, 30d supply, fill #1
  Filled 2022-09-07: qty 30, 30d supply, fill #2

## 2022-05-27 NOTE — Progress Notes (Signed)
Follow-up Visit   Date: 05/28/22   Stanley Boyd MRN: [id number] DOB: [birth year]    Stanley Boyd is a 71 y.o. 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  The patient was accompanied to the clinic by wife who also provides collateral information.    IMPRESSION/PLAN: Anti-HMGCR necrotizing myopathy, (02/2019), clinically doing great with no weakness on exam and down trending CK.  Last CK 501, over the past year it has fluctuated ~ 800-1000s. Previously managed on a long course of IVIG (discontinued in 01/2021), prednisone, and Cellcept (increased to '1000mg'$  BID in Feb 2022) Continue Cellcept '1000mg'$  BID Slowly start to taper prednisone by taking '20mg'$  alternating with '15mg'$  x 2 weeks, then '15mg'$  daily Check CK level in 6 weeks  2.  Bilateral optic neuropathy, stable.  Previously evaluated by [facility] neuroophthalmology  Return to clinic in 2 months   ------------------------------------------------------------------------ Interim History:  Stanley Boyd returns for follow-up visit.  From a neurological standpoint, he is doing great and denies new weakness. Last CK is 501.  In May, he was hospitalized with atrial fibrillation and started on Eliquis, but doing well.  Over the summer, he had a month long trip to [place] and [place] with family.    Medications:  Current Outpatient Medications on File Prior to Visit  Medication Sig Dispense Refill   Alirocumab (PRALUENT) 150 MG/ML SOAJ INJECT THE CONTENTS OF 1 PEN EVERY 2 WEEKS AS DIRECTED 2 mL 0   calcium-vitamin D (OSCAL WITH D) 500-200 MG-UNIT TABS tablet Take 1 tablet by mouth 3 (three) times daily. 270 tablet 3   Carboxymethylcellulose Sodium (REFRESH TEARS OP) Place 1 drop into both eyes  daily as needed (dry eyes).      colchicine 0.6 MG tablet Take 1 tablet (0.6 mg total) by mouth daily as needed (psuedogout pain). 30 tablet 2   ELIQUIS 5 MG TABS tablet Take 1 tablet by mouth twice daily 60 tablet 0   empagliflozin (JARDIANCE) 10 MG TABS tablet Take 1 tablet (10 mg total) by mouth daily before breakfast. 30 tablet 5   ezetimibe (ZETIA) 10 MG tablet Take 1 tablet (10 mg total) by mouth daily. 90 tablet 3   finasteride (PROSCAR) 5 MG tablet Take 5 mg by mouth at bedtime.      fluticasone (FLONASE) 50 MCG/ACT nasal spray Place 2 sprays into both nostrils daily as needed for allergies or rhinitis.     metFORMIN (GLUCOPHAGE) 1000 MG tablet Take 1 tablet (1,000 mg total) by mouth 2 (two) times daily with a meal. 180 tablet 3   mycophenolate (CELLCEPT) 500 MG tablet Take 2 tablets (1,000 mg total) by mouth 2 (two) times daily. 360 tablet 3   nitroGLYCERIN (NITROSTAT) 0.4 MG SL tablet Place 1 tablet (0.4 mg total) under the tongue every 5 (five) minutes as needed for chest pain. 25 tablet 4   pantoprazole (PROTONIX) 20 MG tablet Take 1 tablet (20 mg total) by mouth daily. 90 tablet 3   sildenafil (VIAGRA) 100 MG tablet Take 1 tablet (100 mg total) by mouth daily as needed for erectile dysfunction. 10 tablet 11   tamsulosin (FLOMAX) 0.4 MG CAPS capsule Take 0.4 mg by mouth at bedtime.     VASCEPA 1 g capsule Take 2 capsules by mouth twice daily 120 capsule 0  vitamin B-12 (CYANOCOBALAMIN) 1000 MCG tablet Take 1,000 mcg by mouth daily.     No current facility-administered medications on file prior to visit.    Allergies:  Allergies  Allergen Reactions   Covid-19 Mrna Vacc (Moderna) Other (See Comments)    Optic neuropathy after original 2 vaccines    Statins Other (See Comments)    Myositis    Vital Signs:  BP 111/68   Pulse 64   Ht '5\' 9"'$  (1.753 m)   Wt 195 lb (88.5 kg)   SpO2 98%   BMI 28.80 kg/m   Neurological Exam: MENTAL STATUS including orientation to time, place,  person, recent and remote memory, attention span and concentration, language, and fund of knowledge is normal.  Speech is not dysarthric.  CRANIAL NERVES: Normal conjugate, extra-ocular eye movements in all directions of gaze.  No ptosis bilaterally. Facial muscles 5/5.   MOTOR:  Motor strength is 5/5 proximally, R hand is 27/5 (old from stroke). No atrophy, fasciculations or abnormal movements.  No pronator drift.   COORDINATION/GAIT:   Gait narrow based and stable. Easily able to stand up from chair without using arms.  Data: MRI cervical spine without contrast 02/17/2019: 1. 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 2. Solid anterior fusion at C4-5 and C5-6 without spinal stenosis at these levels. 3. Adjacent segment degenerative changes with moderate spinal stenosis at C6-7, mild-to-moderate spinal stenosis at C3-4, and mild-to-moderate neural foraminal stenosis at both levels.   MRI lumbar spine 02/17/2019: 1. Relatively diffuse lumbar disc and facet degeneration without significant spinal stenosis. 2. Multilevel neural foraminal stenosis, moderate on the left at L5-S1. 3. Normal appearance of the conus medullaris and cauda equina.   MRI thoracic spine without contrast 02/17/2019: 1. Mild thoracic disc and facet degeneration without evidence of neural impingement. 2. Minimal prominence of the central canal of the mid to lower thoracic spinal cord without syrinx.  Left rectus femorus muscle biopsy 02/20/2019:  Active necrotizing inflammatory myopathy. Right chest wall skin biopsy:  Benign skin with fibrosis and mixed superficial and deep inflammation  Neuromuscular Lab - [facility] 03/20/2019:  Myopathy 2 panel - HMGCR 36,000 (normal <2500)  Lab Results  Component Value Date   CKTOTAL 501 (H) 05/21/2022   CKMB CANCELED 03/21/2020   CKMBINDEX 23.6 (HH) 04/15/2021   TROPONINI 0.03 (HH) 02/17/2019    Lab Results  Component Value Date   HGBA1C 7.7 (H) 05/21/2022     Thank you for allowing  me to participate in patient's care.  If I can answer any additional questions, I would be pleased to do so.    Sincerely,    Kate Sweetman K. Posey Pronto, DO

## 2022-05-28 ENCOUNTER — Encounter: Payer: Self-pay | Admitting: Neurology

## 2022-05-28 ENCOUNTER — Ambulatory Visit: Payer: Medicare Other | Admitting: Neurology

## 2022-05-28 VITALS — BP 111/68 | HR 64 | Ht 69.0 in | Wt 195.0 lb

## 2022-05-28 DIAGNOSIS — G7249 Other inflammatory and immune myopathies, not elsewhere classified: Secondary | ICD-10-CM | POA: Diagnosis not present

## 2022-05-28 DIAGNOSIS — Z5181 Encounter for therapeutic drug level monitoring: Secondary | ICD-10-CM

## 2022-05-28 MED ORDER — PREDNISONE 10 MG PO TABS
ORAL_TABLET | ORAL | 3 refills | Status: DC
  Filled 2022-07-11 – 2022-07-20 (×5): qty 135, 90d supply, fill #0

## 2022-05-28 NOTE — Patient Instructions (Signed)
Start to lower prednisone by taking '20mg'$  alternating with '15mg'$  for two weeks  Check CK in 6 weeks  Return to clinic 2 months

## 2022-06-02 ENCOUNTER — Other Ambulatory Visit: Payer: Self-pay | Admitting: Cardiology

## 2022-06-02 DIAGNOSIS — I2510 Atherosclerotic heart disease of native coronary artery without angina pectoris: Secondary | ICD-10-CM

## 2022-06-02 DIAGNOSIS — I480 Paroxysmal atrial fibrillation: Secondary | ICD-10-CM

## 2022-06-02 DIAGNOSIS — E7800 Pure hypercholesterolemia, unspecified: Secondary | ICD-10-CM

## 2022-06-03 ENCOUNTER — Other Ambulatory Visit (HOSPITAL_COMMUNITY): Payer: Self-pay

## 2022-06-04 ENCOUNTER — Ambulatory Visit: Payer: Medicare Other | Admitting: Cardiology

## 2022-06-05 ENCOUNTER — Other Ambulatory Visit (HOSPITAL_COMMUNITY): Payer: Self-pay

## 2022-06-10 ENCOUNTER — Other Ambulatory Visit: Payer: Self-pay

## 2022-06-10 ENCOUNTER — Encounter: Payer: Self-pay | Admitting: Cardiology

## 2022-06-10 DIAGNOSIS — I2510 Atherosclerotic heart disease of native coronary artery without angina pectoris: Secondary | ICD-10-CM

## 2022-06-10 MED ORDER — PRALUENT 150 MG/ML ~~LOC~~ SOAJ
SUBCUTANEOUS | 3 refills | Status: DC
  Filled 2022-07-11: qty 6, 84d supply, fill #0

## 2022-06-28 ENCOUNTER — Ambulatory Visit (INDEPENDENT_AMBULATORY_CARE_PROVIDER_SITE_OTHER): Payer: Medicare Other

## 2022-06-28 DIAGNOSIS — Z0000 Encounter for general adult medical examination without abnormal findings: Secondary | ICD-10-CM

## 2022-06-28 NOTE — Patient Instructions (Addendum)
[name] , Thank you for taking time to come for your Medicare Wellness Visit. I appreciate your ongoing commitment to your health goals. Please review the following plan we discussed and let me know if I can assist you in the future.   Screening recommendations/referrals: Colonoscopy: done 01/25/20 repeat every 5 years  Recommended yearly ophthalmology/optometry visit for glaucoma screening and checkup Recommended yearly dental visit for hygiene and checkup  Vaccinations: Influenza vaccine: done 08/19/21 repeat every year  Pneumococcal vaccine: due and discussed  Tdap vaccine: due  Shingles vaccine: Shingrix is 2 doses 2-6 months apart and over 90% effective    Covid-19: discontinued   Advanced directives: Please bring a copy of your health care power of attorney and living will to the office at your convenience.  Conditions/risks identified: get off prednisone   Next appointment: Follow up in one year for your annual wellness visit.   Preventive Care 69 Years and Older, Male Preventive care refers to lifestyle choices and visits with your health care provider that can promote health and wellness. What does preventive care include? A yearly physical exam. This is also called an annual well check. Dental exams once or twice a year. Routine eye exams. Ask your health care provider how often you should have your eyes checked. Personal lifestyle choices, including: Daily care of your teeth and gums. Regular physical activity. Eating a healthy diet. Avoiding tobacco and drug use. Limiting alcohol use. Practicing safe sex. Taking low doses of aspirin every day. Taking vitamin and mineral supplements as recommended by your health care provider. What happens during an annual well check? The services and screenings done by your health care provider during your annual well check will depend on your age, overall health, lifestyle risk factors, and family history of disease. Counseling   Your health care provider may ask you questions about your: Alcohol use. Tobacco use. Drug use. Emotional well-being. Home and relationship well-being. Sexual activity. Eating habits. History of falls. Memory and ability to understand (cognition). Work and work Statistician. Screening  You may have the following tests or measurements: Height, weight, and BMI. Blood pressure. Lipid and cholesterol levels. These may be checked every 5 years, or more frequently if you are over 69 years old. Skin check. Lung cancer screening. You may have this screening every year starting at age 22 if you have a 30-pack-year history of smoking and currently smoke or have quit within the past 15 years. Fecal occult blood test (FOBT) of the stool. You may have this test every year starting at age 69. Flexible sigmoidoscopy or colonoscopy. You may have a sigmoidoscopy every 5 years or a colonoscopy every 10 years starting at age 69. Prostate cancer screening. Recommendations will vary depending on your family history and other risks. Hepatitis C blood test. Hepatitis B blood test. Sexually transmitted disease (STD) testing. Diabetes screening. This is done by checking your blood sugar (glucose) after you have not eaten for a while (fasting). You may have this done every 1-3 years. Abdominal aortic aneurysm (AAA) screening. You may need this if you are a current or former smoker. Osteoporosis. You may be screened starting at age 69 if you are at high risk. Talk with your health care provider about your test results, treatment options, and if necessary, the need for more tests. Vaccines  Your health care provider may recommend certain vaccines, such as: Influenza vaccine. This is recommended every year. Tetanus, diphtheria, and acellular pertussis (Tdap, Td) vaccine. You may need a  Td booster every 10 years. Zoster vaccine. You may need this after age 85. Pneumococcal 13-valent conjugate (PCV13) vaccine.  One dose is recommended after age 61. Pneumococcal polysaccharide (PPSV23) vaccine. One dose is recommended after age 52. Talk to your health care provider about which screenings and vaccines you need and how often you need them. This information is not intended to replace advice given to you by your health care provider. Make sure you discuss any questions you have with your health care provider. Document Released: 10/24/2015 Document Revised: 06/16/2016 Document Reviewed: 07/29/2015 Elsevier Interactive Patient Education  2017 [place] Prevention in the Home Falls can cause injuries. They can happen to people of all ages. There are many things you can do to make your home safe and to help prevent falls. What can I do on the outside of my home? Regularly fix the edges of walkways and driveways and fix any cracks. Remove anything that might make you trip as you walk through a door, such as a raised step or threshold. Trim any bushes or trees on the path to your home. Use bright outdoor lighting. Clear any walking paths of anything that might make someone trip, such as rocks or tools. Regularly check to see if handrails are loose or broken. Make sure that both sides of any steps have handrails. Any raised decks and porches should have guardrails on the edges. Have any leaves, snow, or ice cleared regularly. Use sand or salt on walking paths during winter. Clean up any spills in your garage right away. This includes oil or grease spills. What can I do in the bathroom? Use night lights. Install grab bars by the toilet and in the tub and shower. Do not use towel bars as grab bars. Use non-skid mats or decals in the tub or shower. If you need to sit down in the shower, use a plastic, non-slip stool. Keep the floor dry. Clean up any water that spills on the floor as soon as it happens. Remove soap buildup in the tub or shower regularly. Attach bath mats securely with double-sided  non-slip rug tape. Do not have throw rugs and other things on the floor that can make you trip. What can I do in the bedroom? Use night lights. Make sure that you have a light by your bed that is easy to reach. Do not use any sheets or blankets that are too big for your bed. They should not hang down onto the floor. Have a firm chair that has side arms. You can use this for support while you get dressed. Do not have throw rugs and other things on the floor that can make you trip. What can I do in the kitchen? Clean up any spills right away. Avoid walking on wet floors. Keep items that you use a lot in easy-to-reach places. If you need to reach something above you, use a strong step stool that has a grab bar. Keep electrical cords out of the way. Do not use floor polish or wax that makes floors slippery. If you must use wax, use non-skid floor wax. Do not have throw rugs and other things on the floor that can make you trip. What can I do with my stairs? Do not leave any items on the stairs. Make sure that there are handrails on both sides of the stairs and use them. Fix handrails that are broken or loose. Make sure that handrails are as long as the stairways. Check any  carpeting to make sure that it is firmly attached to the stairs. Fix any carpet that is loose or worn. Avoid having throw rugs at the top or bottom of the stairs. If you do have throw rugs, attach them to the floor with carpet tape. Make sure that you have a light switch at the top of the stairs and the bottom of the stairs. If you do not have them, ask someone to add them for you. What else can I do to help prevent falls? Wear shoes that: Do not have high heels. Have rubber bottoms. Are comfortable and fit you well. Are closed at the toe. Do not wear sandals. If you use a stepladder: Make sure that it is fully opened. Do not climb a closed stepladder. Make sure that both sides of the stepladder are locked into place. Ask  someone to hold it for you, if possible. Clearly mark and make sure that you can see: Any grab bars or handrails. First and last steps. Where the edge of each step is. Use tools that help you move around (mobility aids) if they are needed. These include: Canes. Walkers. Scooters. Crutches. Turn on the lights when you go into a dark area. Replace any light bulbs as soon as they burn out. Set up your furniture so you have a clear path. Avoid moving your furniture around. If any of your floors are uneven, fix them. If there are any pets around you, be aware of where they are. Review your medicines with your doctor. Some medicines can make you feel dizzy. This can increase your chance of falling. Ask your doctor what other things that you can do to help prevent falls. This information is not intended to replace advice given to you by your health care provider. Make sure you discuss any questions you have with your health care provider. Document Released: 07/24/2009 Document Revised: 03/04/2016 Document Reviewed: 11/01/2014 Elsevier Interactive Patient Education  2017 [employer].
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
## 2022-06-29 ENCOUNTER — Encounter: Payer: Self-pay | Admitting: Cardiology

## 2022-06-29 ENCOUNTER — Other Ambulatory Visit (HOSPITAL_COMMUNITY): Payer: Self-pay

## 2022-06-29 MED ORDER — ICOSAPENT ETHYL 1 G PO CAPS
4.0000 g | ORAL_CAPSULE | Freq: Every day | ORAL | 0 refills | Status: DC
  Filled 2022-06-29: qty 120, 30d supply, fill #0

## 2022-06-30 ENCOUNTER — Other Ambulatory Visit: Payer: Self-pay

## 2022-06-30 ENCOUNTER — Other Ambulatory Visit (HOSPITAL_COMMUNITY): Payer: Self-pay

## 2022-06-30 DIAGNOSIS — I2510 Atherosclerotic heart disease of native coronary artery without angina pectoris: Secondary | ICD-10-CM

## 2022-06-30 DIAGNOSIS — E7800 Pure hypercholesterolemia, unspecified: Secondary | ICD-10-CM

## 2022-06-30 MED ORDER — VASCEPA 1 G PO CAPS
2.0000 g | ORAL_CAPSULE | Freq: Two times a day (BID) | ORAL | 1 refills | Status: DC
  Filled 2022-06-30: qty 120, 30d supply, fill #0
  Filled 2022-07-27: qty 120, 30d supply, fill #1

## 2022-06-30 NOTE — Telephone Encounter (Signed)
Medication has been refilled.

## 2022-07-02 ENCOUNTER — Other Ambulatory Visit (HOSPITAL_COMMUNITY): Payer: Self-pay

## 2022-07-02 ENCOUNTER — Other Ambulatory Visit: Payer: Self-pay | Admitting: Cardiology

## 2022-07-02 DIAGNOSIS — I480 Paroxysmal atrial fibrillation: Secondary | ICD-10-CM

## 2022-07-03 ENCOUNTER — Other Ambulatory Visit (HOSPITAL_COMMUNITY): Payer: Self-pay

## 2022-07-03 MED ORDER — FINASTERIDE 5 MG PO TABS
5.0000 mg | ORAL_TABLET | Freq: Every day | ORAL | 3 refills | Status: DC
  Filled 2022-07-13: qty 90, 90d supply, fill #0
  Filled 2022-11-29: qty 90, 90d supply, fill #1

## 2022-07-03 MED ORDER — TAMSULOSIN HCL 0.4 MG PO CAPS
0.4000 mg | ORAL_CAPSULE | Freq: Every day | ORAL | 1 refills | Status: DC
  Filled 2022-07-11: qty 90, 90d supply, fill #0
  Filled 2022-11-29: qty 90, 90d supply, fill #1

## 2022-07-03 MED ORDER — TIZANIDINE HCL 4 MG PO TABS
4.0000 mg | ORAL_TABLET | Freq: Four times a day (QID) | ORAL | 0 refills | Status: DC | PRN
  Filled 2022-07-03 – 2023-02-13 (×2): qty 30, 8d supply, fill #0

## 2022-07-05 ENCOUNTER — Other Ambulatory Visit: Payer: Self-pay

## 2022-07-05 ENCOUNTER — Other Ambulatory Visit (HOSPITAL_COMMUNITY): Payer: Self-pay

## 2022-07-06 ENCOUNTER — Other Ambulatory Visit (HOSPITAL_COMMUNITY): Payer: Self-pay

## 2022-07-06 MED ORDER — APIXABAN 5 MG PO TABS
5.0000 mg | ORAL_TABLET | Freq: Two times a day (BID) | ORAL | 0 refills | Status: DC
  Filled 2022-07-06: qty 60, 30d supply, fill #0

## 2022-07-12 ENCOUNTER — Other Ambulatory Visit (HOSPITAL_COMMUNITY): Payer: Self-pay

## 2022-07-13 ENCOUNTER — Other Ambulatory Visit (HOSPITAL_COMMUNITY): Payer: Self-pay

## 2022-07-13 ENCOUNTER — Encounter: Payer: Self-pay | Admitting: Cardiology

## 2022-07-13 ENCOUNTER — Encounter: Payer: Self-pay | Admitting: Family Medicine

## 2022-07-13 ENCOUNTER — Other Ambulatory Visit: Payer: Self-pay

## 2022-07-13 DIAGNOSIS — I2510 Atherosclerotic heart disease of native coronary artery without angina pectoris: Secondary | ICD-10-CM

## 2022-07-13 MED ORDER — PRALUENT 150 MG/ML ~~LOC~~ SOAJ
SUBCUTANEOUS | 3 refills | Status: DC
  Filled 2022-07-13: qty 6, 84d supply, fill #0

## 2022-07-14 ENCOUNTER — Other Ambulatory Visit (HOSPITAL_COMMUNITY): Payer: Self-pay

## 2022-07-15 ENCOUNTER — Other Ambulatory Visit (HOSPITAL_COMMUNITY): Payer: Self-pay

## 2022-07-16 LAB — CK: Total CK: 750 U/L (ref 41–331)

## 2022-07-20 ENCOUNTER — Encounter: Payer: Self-pay | Admitting: Neurology

## 2022-07-21 ENCOUNTER — Other Ambulatory Visit: Payer: Self-pay

## 2022-07-21 ENCOUNTER — Other Ambulatory Visit (HOSPITAL_COMMUNITY): Payer: Self-pay

## 2022-07-21 MED ORDER — PREDNISONE 10 MG PO TABS
15.0000 mg | ORAL_TABLET | Freq: Every day | ORAL | 0 refills | Status: DC
  Filled 2022-07-21 – 2022-07-27 (×4): qty 135, 90d supply, fill #0

## 2022-07-22 ENCOUNTER — Other Ambulatory Visit (HOSPITAL_COMMUNITY): Payer: Self-pay

## 2022-07-26 ENCOUNTER — Other Ambulatory Visit (HOSPITAL_COMMUNITY): Payer: Self-pay

## 2022-07-27 ENCOUNTER — Other Ambulatory Visit (HOSPITAL_COMMUNITY): Payer: Self-pay

## 2022-07-29 ENCOUNTER — Other Ambulatory Visit (HOSPITAL_COMMUNITY): Payer: Self-pay

## 2022-07-29 ENCOUNTER — Other Ambulatory Visit: Payer: Self-pay | Admitting: Cardiology

## 2022-07-29 DIAGNOSIS — I480 Paroxysmal atrial fibrillation: Secondary | ICD-10-CM

## 2022-07-29 MED ORDER — APIXABAN 5 MG PO TABS
5.0000 mg | ORAL_TABLET | Freq: Two times a day (BID) | ORAL | 0 refills | Status: DC
  Filled 2022-07-29: qty 60, 30d supply, fill #0

## 2022-08-04 ENCOUNTER — Encounter: Payer: Self-pay | Admitting: Neurology

## 2022-08-04 ENCOUNTER — Ambulatory Visit: Payer: Medicare Other | Admitting: Neurology

## 2022-08-04 VITALS — BP 115/68 | HR 54 | Ht 69.0 in | Wt 192.0 lb

## 2022-08-04 DIAGNOSIS — G7249 Other inflammatory and immune myopathies, not elsewhere classified: Secondary | ICD-10-CM | POA: Diagnosis not present

## 2022-08-04 NOTE — Progress Notes (Signed)
Follow-up Visit   Date: 08/04/22   Stanley Boyd MRN: [id number] DOB: 1951-01-26    Stanley Boyd is a 71 y.o. 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  The patient was accompanied to the clinic by wife who also provides collateral information.    IMPRESSION/PLAN: Anti-HMGCR necrotizing myopathy, (02/2019), clinically doing great with no weakness on exam on a slow steroid taper. CK has been fluctuating 500-1000s. Previously managed on a long course of IVIG (discontinued in 01/2021), prednisone, and Cellcept (increased to '1000mg'$  BID in Feb 2022) Continue Cellcept '1000mg'$  BID Reduce prednisone '15mg'$  alternating with '10mg'$  x 1 month, then reduce to '10mg'$  daily Check CK in 8 weeks  2.  Bilateral optic neuropathy, stable.  Previously evaluated by [facility]  Return to clinic in 2 months   ------------------------------------------------------------------------ Interim History:  Stanley Boyd returns for follow-up visit.  He has been doing very well and has not noticed any weakness while tapering his prednisone down to '15mg'$  daily.  He has been very active outside such as cutting tree limbs and back to hunting with a crossbow.  No new complaints.  Last CK was 750.  Medications:  Current Outpatient Medications on File Prior to Visit  Medication Sig Dispense Refill   Alirocumab (PRALUENT) 150 MG/ML SOAJ INJECT THE CONTENTS OF 1 PEN EVERY 2 WEEKS AS DIRECTED 6 mL 3   apixaban (ELIQUIS) 5 MG TABS tablet Take 1 tablet (5 mg total) by mouth 2 (two) times daily. 60 tablet 0   calcium-vitamin D (OSCAL WITH D) 500-200 MG-UNIT TABS tablet Take 1 tablet by mouth 3 (three) times daily. 270 tablet 3   Carboxymethylcellulose Sodium (REFRESH TEARS OP)  Place 1 drop into both eyes daily as needed (dry eyes).      colchicine 0.6 MG tablet Take 1 tablet (0.6 mg total) by mouth daily as needed (psuedogout pain). 30 tablet 2   empagliflozin (JARDIANCE) 10 MG TABS tablet Take 1 tablet (10 mg total) by mouth daily before breakfast. 30 tablet 5   ezetimibe (ZETIA) 10 MG tablet Take 1 tablet (10 mg total) by mouth daily. 90 tablet 3   finasteride (PROSCAR) 5 MG tablet Take 5 mg by mouth at bedtime.      finasteride (PROSCAR) 5 MG tablet Take 1 tablet (5 mg total) by mouth daily. 90 tablet 3   fluticasone (FLONASE) 50 MCG/ACT nasal spray Place 2 sprays into both nostrils daily as needed for allergies or rhinitis.     metFORMIN (GLUCOPHAGE) 1000 MG tablet Take 1 tablet (1,000 mg total) by mouth 2 (two) times daily with a meal. 180 tablet 3   mycophenolate (CELLCEPT) 500 MG tablet Take 2 tablets (1,000 mg total) by mouth 2 (two) times daily. 360 tablet 3   nitroGLYCERIN (NITROSTAT) 0.4 MG SL tablet Place 1 tablet (0.4 mg total) under the tongue every 5 (five) minutes as needed for chest pain. 25 tablet 4   pantoprazole (PROTONIX) 20 MG tablet Take 1 tablet (20 mg total) by mouth daily. 90 tablet 3   predniSONE (DELTASONE) 10 MG tablet Take 1.5 tablets (15 mg total) by mouth daily. 180 tablet 0   sildenafil (VIAGRA) 100 MG tablet Take 1 tablet (100 mg total) by mouth daily as needed for erectile dysfunction. 10 tablet 11   tamsulosin (FLOMAX) 0.4  MG CAPS capsule Take 0.4 mg by mouth at bedtime.     tamsulosin (FLOMAX) 0.4 MG CAPS capsule Take 1 capsule (0.4 mg total) by mouth daily. 90 capsule 1   tiZANidine (ZANAFLEX) 4 MG tablet Take 1 tablet (4 mg total) by mouth every 6 (six) hours as needed for muscle spasm 30 tablet 0   VASCEPA 1 g capsule Take 2 capsules (2 g total) by mouth 2 (two) times daily. 120 capsule 1   vitamin B-12 (CYANOCOBALAMIN) 1000 MCG tablet Take 1,000 mcg by mouth daily.     No current facility-administered medications on file prior to  visit.    Allergies:  Allergies  Allergen Reactions   Covid-19 Mrna Vacc (Moderna) Other (See Comments)    Optic neuropathy after original 2 vaccines    Statins Other (See Comments)    Myositis    Vital Signs:  BP 115/68   Pulse (!) 54   Ht '5\' 9"'$  (1.753 m)   Wt 192 lb (87.1 kg)   SpO2 99%   BMI 28.35 kg/m   Neurological Exam: MENTAL STATUS including orientation to time, place, person, recent and remote memory, attention span and concentration, language, and fund of knowledge is normal.  Speech is not dysarthric.  CRANIAL NERVES: Normal conjugate, extra-ocular eye movements in all directions of gaze.  No ptosis bilaterally. Facial muscles 5/5.   MOTOR:  Motor strength is 5/5 proximally, R hand is 47/5 (old from stroke). No atrophy, fasciculations or abnormal movements.  No pronator drift.   COORDINATION/GAIT:   Gait narrow based and stable. Easily able to stand up from chair without using arms.  Data: MRI cervical spine without contrast 02/17/2019: 1. 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 2. Solid anterior fusion at C4-5 and C5-6 without spinal stenosis at these levels. 3. Adjacent segment degenerative changes with moderate spinal stenosis at C6-7, mild-to-moderate spinal stenosis at C3-4, and mild-to-moderate neural foraminal stenosis at both levels.   MRI lumbar spine 02/17/2019: 1. Relatively diffuse lumbar disc and facet degeneration without significant spinal stenosis. 2. Multilevel neural foraminal stenosis, moderate on the left at L5-S1. 3. Normal appearance of the conus medullaris and cauda equina.   MRI thoracic spine without contrast 02/17/2019: 1. Mild thoracic disc and facet degeneration without evidence of neural impingement. 2. Minimal prominence of the central canal of the mid to lower thoracic spinal cord without syrinx.  Left rectus femorus muscle biopsy 02/20/2019:  Active necrotizing inflammatory myopathy. Right chest wall skin biopsy:  Benign skin with fibrosis and mixed  superficial and deep inflammation  Neuromuscular Lab - [facility] 03/20/2019:  Myopathy 2 panel - HMGCR 36,000 (normal <2500)  Lab Results  Component Value Date   CKTOTAL 750 (HH) 07/15/2022   CKMB CANCELED 03/21/2020   CKMBINDEX 23.6 (HH) 04/15/2021   TROPONINI 0.03 (HH) 02/17/2019    Lab Results  Component Value Date   HGBA1C 7.7 (H) 05/21/2022     Thank you for allowing me to participate in patient's care.  If I can answer any additional questions, I would be pleased to do so.    Sincerely,    Justn Quale K. Posey Pronto, DO

## 2022-08-04 NOTE — Patient Instructions (Addendum)
Reduce prednisone '15mg'$  alternating with '10mg'$  x 1 month, then reduce to '10mg'$  daily  Check CK before next visit  Return to clinic in January

## 2022-08-09 ENCOUNTER — Other Ambulatory Visit (HOSPITAL_COMMUNITY): Payer: Self-pay

## 2022-08-11 ENCOUNTER — Ambulatory Visit (INDEPENDENT_AMBULATORY_CARE_PROVIDER_SITE_OTHER): Payer: Medicare Other

## 2022-08-11 DIAGNOSIS — Z23 Encounter for immunization: Secondary | ICD-10-CM | POA: Diagnosis not present

## 2022-08-12 ENCOUNTER — Other Ambulatory Visit (HOSPITAL_COMMUNITY): Payer: Self-pay

## 2022-08-24 ENCOUNTER — Other Ambulatory Visit: Payer: Self-pay | Admitting: Cardiology

## 2022-08-24 ENCOUNTER — Other Ambulatory Visit (HOSPITAL_COMMUNITY): Payer: Self-pay

## 2022-08-24 DIAGNOSIS — I2510 Atherosclerotic heart disease of native coronary artery without angina pectoris: Secondary | ICD-10-CM

## 2022-08-24 DIAGNOSIS — E7800 Pure hypercholesterolemia, unspecified: Secondary | ICD-10-CM

## 2022-08-24 MED ORDER — VASCEPA 1 G PO CAPS
2.0000 g | ORAL_CAPSULE | Freq: Two times a day (BID) | ORAL | 1 refills | Status: DC
  Filled 2022-08-24: qty 120, 30d supply, fill #0
  Filled 2022-09-24: qty 120, 30d supply, fill #1

## 2022-08-28 ENCOUNTER — Other Ambulatory Visit: Payer: Self-pay | Admitting: Cardiology

## 2022-08-28 DIAGNOSIS — I480 Paroxysmal atrial fibrillation: Secondary | ICD-10-CM

## 2022-08-30 ENCOUNTER — Other Ambulatory Visit (HOSPITAL_COMMUNITY): Payer: Self-pay

## 2022-08-30 MED ORDER — APIXABAN 5 MG PO TABS
5.0000 mg | ORAL_TABLET | Freq: Two times a day (BID) | ORAL | 0 refills | Status: DC
  Filled 2022-08-30: qty 60, 30d supply, fill #0

## 2022-09-07 ENCOUNTER — Other Ambulatory Visit (HOSPITAL_COMMUNITY): Payer: Self-pay

## 2022-09-14 ENCOUNTER — Other Ambulatory Visit (HOSPITAL_COMMUNITY): Payer: Self-pay

## 2022-09-15 ENCOUNTER — Encounter: Payer: Self-pay | Admitting: Cardiology

## 2022-09-15 DIAGNOSIS — E7800 Pure hypercholesterolemia, unspecified: Secondary | ICD-10-CM

## 2022-09-15 DIAGNOSIS — I2510 Atherosclerotic heart disease of native coronary artery without angina pectoris: Secondary | ICD-10-CM

## 2022-09-15 DIAGNOSIS — T466X5A Adverse effect of antihyperlipidemic and antiarteriosclerotic drugs, initial encounter: Secondary | ICD-10-CM

## 2022-09-15 DIAGNOSIS — G720 Drug-induced myopathy: Secondary | ICD-10-CM

## 2022-09-16 ENCOUNTER — Other Ambulatory Visit: Payer: Self-pay | Admitting: Cardiology

## 2022-09-16 ENCOUNTER — Other Ambulatory Visit (HOSPITAL_COMMUNITY): Payer: Self-pay

## 2022-09-16 DIAGNOSIS — E7800 Pure hypercholesterolemia, unspecified: Secondary | ICD-10-CM

## 2022-09-16 DIAGNOSIS — I2510 Atherosclerotic heart disease of native coronary artery without angina pectoris: Secondary | ICD-10-CM

## 2022-09-16 DIAGNOSIS — G720 Drug-induced myopathy: Secondary | ICD-10-CM

## 2022-09-16 DIAGNOSIS — T466X5A Adverse effect of antihyperlipidemic and antiarteriosclerotic drugs, initial encounter: Secondary | ICD-10-CM

## 2022-09-16 MED ORDER — REPATHA SURECLICK 140 MG/ML ~~LOC~~ SOAJ
1.0000 mL | SUBCUTANEOUS | 3 refills | Status: DC
  Filled 2022-09-16 – 2022-10-21 (×3): qty 6, 84d supply, fill #0

## 2022-09-16 MED ORDER — REPATHA SURECLICK 140 MG/ML ~~LOC~~ SOAJ
6.0000 mL | SUBCUTANEOUS | 3 refills | Status: DC
  Filled 2022-09-16: qty 6, 14d supply, fill #0

## 2022-09-16 MED ORDER — REPATHA SURECLICK 140 MG/ML ~~LOC~~ SOAJ
1.0000 mL | SUBCUTANEOUS | 3 refills | Status: DC
  Filled 2022-09-16: qty 6, 84d supply, fill #0

## 2022-09-16 NOTE — Telephone Encounter (Signed)
ICD-10-CM   1. Coronary artery disease involving native coronary artery of native heart without angina pectoris  I25.10 Evolocumab (REPATHA SURECLICK) 867 MG/ML SOAJ    2. Hypercholesteremia  E78.00 Evolocumab (REPATHA SURECLICK) 672 MG/ML SOAJ    3. 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    Dispense:  6 mL    Refill:  3

## 2022-09-16 NOTE — Telephone Encounter (Signed)
ICD-10-CM   1. Coronary artery disease involving native coronary artery of native heart without angina pectoris  I25.10 Evolocumab (REPATHA SURECLICK) 024 MG/ML SOAJ    2. Hypercholesteremia  E78.00 Evolocumab (REPATHA SURECLICK) 097 MG/ML SOAJ    3. Statin myopathy  G72.0 Evolocumab (REPATHA SURECLICK) 353 MG/ML SOAJ   T46.6X5A      Meds ordered this encounter  Medications   Evolocumab (REPATHA SURECLICK) 299 MG/ML SOAJ    Sig: Inject 140 mg into the skin every 14 (fourteen) days.    Dispense:  6 mL    Refill:  3

## 2022-09-17 ENCOUNTER — Other Ambulatory Visit (HOSPITAL_COMMUNITY): Payer: Self-pay

## 2022-09-23 ENCOUNTER — Ambulatory Visit: Payer: Self-pay

## 2022-09-23 ENCOUNTER — Ambulatory Visit (INDEPENDENT_AMBULATORY_CARE_PROVIDER_SITE_OTHER): Payer: Medicare Other

## 2022-09-23 ENCOUNTER — Ambulatory Visit: Payer: Medicare Other | Admitting: Family Medicine

## 2022-09-23 VITALS — BP 124/64 | Ht 69.0 in | Wt 195.0 lb

## 2022-09-23 DIAGNOSIS — M25511 Pain in right shoulder: Secondary | ICD-10-CM

## 2022-09-23 DIAGNOSIS — G8929 Other chronic pain: Secondary | ICD-10-CM

## 2022-09-23 NOTE — Progress Notes (Signed)
I, Stanley Boyd, LAT, ATC acting as a scribe for Stanley Leader, MD.  Stanley Boyd is a 71 y.o. male who presents to [place] [facility] today for R arm pain. 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 Pt was previously seen by Dr. Georgina Snell on 02/19/22 for neck pain. Today, pt c/o R arm pain. Pt has suffered 2 falls, July and a few weeks ago. Pt locates pain to the lateral aspect of the R shoulder.  Prior to these more recent falls he had a big fall few years ago fracturing his right wrist causing some nerve damage to his right hand.  He has had some chronic weakness to his right shoulder as a result of the fall as well.  Radiates: no UE Numbness/tingling: no UE Weakness:no Aggravates: R-side lying, shoulder aBd,  Treatments tried: none  Pertinent review of systems: No fevers or chills  Relevant historical information: Coronary artery disease. History of autoimmune necrotizing myopathy and a neck injury.  Exam:  BP 124/64   Ht '5\' 9"'$  (1.753 m)   Wt 195 lb (88.5 kg)   BMI 28.80 kg/m  General: Well Developed, well nourished, and in no acute distress.   MSK: Right shoulder: Shoulder girdle is rotated somewhat more forward but otherwise is normal-appearing Range of motion abduction limited to about 140 degrees.  External rotation full.  Internal rotation lumbar spine. Strength abduction 4/5 External rotation full.  Internal rotation full. Positive Hawkins and Neer's test.  Positive empty can test.  Negative Yergason's and speeds test. Capillary refill and pulses are intact distally.   Lab and Radiology Results  Procedure: Real-time Ultrasound Guided Injection of right shoulder subacromial bursa Device: Philips Affiniti 50G Images permanently stored and available for review in PACS Ultrasound evaluation prior to injection reveals intact appearing rotator  cuff tendons with moderate subacromial bursitis. Verbal informed consent obtained.  Discussed risks and benefits of procedure. Warned about infection, bleeding, hyperglycemia damage to structures among others. Patient expresses understanding and agreement Time-out conducted.   Noted no overlying erythema, induration, or other signs of local infection.   Skin prepped in a sterile fashion.   Local anesthesia: Topical Ethyl chloride.   With sterile technique and under real time ultrasound guidance: 40 mg of Kenalog and 2 mL of Marcaine injected into subacromial bursa. Fluid seen entering the bursa.   Completed without difficulty   Pain immediately resolved suggesting accurate placement of the medication.   Advised to call if fevers/chills, erythema, induration, drainage, or persistent bleeding.   Images permanently stored and available for review in the ultrasound unit.  Impression: Technically successful ultrasound guided injection.    X-ray images right shoulder obtained today personally and independently interpreted No acute fractures tiny rounded density just lateral to the acromion could represent calcific change or an old avulsion.  Appears old.  No severe glenohumeral DJD.  Mild AC DJD is present.  No acute fractures are present. Await formal radiology review     Assessment and Plan: 71 y.o. male with right shoulder pain.  This is an acute exacerbation of a chronic problem.  This also occurs in the setting of some chronic neurologic problems as well.  The main problem today appears to be subacromial bursitis and rotator cuff tendinitis and impingement.  Plan for subacromial injection and a physical therapy referral.  PDMP not reviewed this encounter. Orders Placed This  Encounter  Procedures   [place] LIMITED JOINT SPACE STRUCTURES UP RIGHT(NO LINKED CHARGES)    Order Specific Question:   Reason for Exam (SYMPTOM  OR DIAGNOSIS REQUIRED)    Answer:   right shoulder pain    Order Specific  Question:   Preferred imaging location?    Answer:   [facility]   DG Shoulder Right    Standing Status:   Future    Number of Occurrences:   1    Standing Expiration Date:   09/24/2023    Order Specific Question:   Reason for Exam (SYMPTOM  OR DIAGNOSIS REQUIRED)    Answer:   right shoulder pain    Order Specific Question:   Preferred imaging location?    Answer:   [facility]   Ambulatory referral to Physical Therapy    Referral Priority:   Routine    Referral Type:   Physical Medicine    Referral Reason:   Specialty Services Required    Requested Specialty:   Physical Therapy    Number of Visits Requested:   1   No orders of the defined types were placed in this encounter.    Discussed warning signs or symptoms. Please see discharge instructions. Patient expresses understanding.   The above documentation has been reviewed and is accurate and complete Stanley Boyd, M.D.

## 2022-09-23 NOTE — Patient Instructions (Addendum)
Thank you for coming in today.   Please get an Xray today before you leave   You received an injection today. Seek immediate medical attention if the joint becomes red, extremely painful, or is oozing fluid.   I've referred you to Physical Therapy.  Let us know if you don't hear from them in one week.    Recheck as needed

## 2022-09-24 ENCOUNTER — Other Ambulatory Visit: Payer: Self-pay | Admitting: Family Medicine

## 2022-09-24 ENCOUNTER — Other Ambulatory Visit: Payer: Self-pay

## 2022-09-24 ENCOUNTER — Other Ambulatory Visit (HOSPITAL_COMMUNITY): Payer: Self-pay

## 2022-09-24 MED ORDER — EMPAGLIFLOZIN 10 MG PO TABS
10.0000 mg | ORAL_TABLET | Freq: Every day | ORAL | 5 refills | Status: DC
  Filled 2022-09-24 – 2022-10-12 (×2): qty 30, 30d supply, fill #0
  Filled 2022-11-11: qty 30, 30d supply, fill #1

## 2022-09-27 ENCOUNTER — Other Ambulatory Visit: Payer: Self-pay | Admitting: Cardiology

## 2022-09-27 ENCOUNTER — Other Ambulatory Visit (HOSPITAL_COMMUNITY): Payer: Self-pay

## 2022-09-27 ENCOUNTER — Other Ambulatory Visit: Payer: Self-pay | Admitting: Neurology

## 2022-09-27 ENCOUNTER — Other Ambulatory Visit: Payer: Self-pay

## 2022-09-27 DIAGNOSIS — I2510 Atherosclerotic heart disease of native coronary artery without angina pectoris: Secondary | ICD-10-CM

## 2022-09-27 DIAGNOSIS — E7800 Pure hypercholesterolemia, unspecified: Secondary | ICD-10-CM

## 2022-09-27 DIAGNOSIS — I480 Paroxysmal atrial fibrillation: Secondary | ICD-10-CM

## 2022-09-27 MED ORDER — VASCEPA 1 G PO CAPS
2.0000 g | ORAL_CAPSULE | Freq: Two times a day (BID) | ORAL | 1 refills | Status: DC
  Filled 2022-09-27 – 2022-10-30 (×2): qty 120, 30d supply, fill #0
  Filled 2022-11-29: qty 120, 30d supply, fill #1

## 2022-09-27 MED ORDER — MYCOPHENOLATE MOFETIL 500 MG PO TABS
1000.0000 mg | ORAL_TABLET | Freq: Two times a day (BID) | ORAL | 0 refills | Status: DC
  Filled 2022-09-27 – 2022-12-29 (×2): qty 360, 90d supply, fill #0

## 2022-09-27 MED ORDER — APIXABAN 5 MG PO TABS
5.0000 mg | ORAL_TABLET | Freq: Two times a day (BID) | ORAL | 1 refills | Status: DC
  Filled 2022-09-27 – 2022-10-21 (×2): qty 180, 90d supply, fill #0
  Filled 2023-01-20: qty 180, 90d supply, fill #1

## 2022-09-28 ENCOUNTER — Other Ambulatory Visit (HOSPITAL_COMMUNITY): Payer: Self-pay

## 2022-10-01 NOTE — Progress Notes (Signed)
Right shoulder x-ray looks normal to radiology

## 2022-10-07 ENCOUNTER — Other Ambulatory Visit: Payer: Self-pay | Admitting: Neurology

## 2022-10-08 LAB — CK: Total CK: 677 U/L (ref 41–331)

## 2022-10-13 ENCOUNTER — Other Ambulatory Visit: Payer: Self-pay

## 2022-10-13 ENCOUNTER — Other Ambulatory Visit (HOSPITAL_COMMUNITY): Payer: Self-pay

## 2022-10-13 ENCOUNTER — Encounter: Payer: Self-pay | Admitting: Neurology

## 2022-10-13 ENCOUNTER — Ambulatory Visit: Payer: Medicare Other | Admitting: Neurology

## 2022-10-13 VITALS — BP 116/65 | HR 52 | Ht 69.0 in | Wt 189.0 lb

## 2022-10-13 DIAGNOSIS — Z5181 Encounter for therapeutic drug level monitoring: Secondary | ICD-10-CM

## 2022-10-13 DIAGNOSIS — G7249 Other inflammatory and immune myopathies, not elsewhere classified: Secondary | ICD-10-CM | POA: Diagnosis not present

## 2022-10-13 MED ORDER — PREDNISONE 5 MG PO TABS
5.0000 mg | ORAL_TABLET | Freq: Every day | ORAL | 2 refills | Status: DC
  Filled 2022-10-13: qty 90, 90d supply, fill #0
  Filled 2023-01-26: qty 90, 90d supply, fill #1

## 2022-10-13 NOTE — Patient Instructions (Signed)
Reduce prednisone to '10mg'$  alternating with '5mg'$  daily x 1 month, then reduce to '5mg'$  daily  Recheck CK in 2 months  Follow-up in 2 months

## 2022-10-13 NOTE — Progress Notes (Signed)
Follow-up Visit   Date: 10/13/22   Stanley Boyd MRN: [id number] DOB: 07-23-1951    Joseph Bias is a 72 y.o. 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  The patient was accompanied to the clinic by wife who also provides collateral information.    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  Clinically doing great without weakness.  He is primarily maintained on Cellcept '1000mg'$  BID and on slow prednisone taper, which he is tolerating well. CK has been fluctuating 500-1000s. Previously managed on a long course of IVIG (discontinued in 01/2021), prednisone, and Cellcept (increased to '1000mg'$  BID in Feb 2022) Continue Cellcept '1000mg'$  BID Taper prednisone to '10mg'$  alternating with '5mg'$  x 1 month, then '5mg'$  daily. Check CK in 8 weeks  2.  Bilateral optic neuropathy, stable.  Previously evaluated by [place]  Return to clinic in 2 months   ------------------------------------------------------------------------ Interim History:  Mr. Verde is here for 2 month follow-up.  He is doing very well and denies any new weakness.  He has been able to taper prednisone to '10mg'$  daily.  Last CK 750> 677 and stable.  He remains very active and is happy to be able to get back into hunting this winter.    Medications:  Current Outpatient Medications on File Prior to Visit  Medication Sig Dispense Refill   apixaban (ELIQUIS) 5 MG TABS tablet Take 1 tablet (5 mg total) by mouth 2 (two) times daily. 180 tablet 1   calcium-vitamin D (OSCAL WITH D) 500-200 MG-UNIT TABS tablet Take 1 tablet by mouth 3 (three) times daily. (Patient taking differently: Take 1 tablet by mouth daily.) 270 tablet 3   Carboxymethylcellulose Sodium (REFRESH TEARS  OP) Place 1 drop into both eyes daily as needed (dry eyes).      colchicine 0.6 MG tablet Take 1 tablet (0.6 mg total) by mouth daily as needed (psuedogout pain). 30 tablet 2   empagliflozin (JARDIANCE) 10 MG TABS tablet Take 1 tablet (10 mg total) by mouth daily before breakfast. 30 tablet 5   Evolocumab (REPATHA SURECLICK) 595 MG/ML SOAJ Inject 140 mg into the skin every 14 (fourteen) days. 6 mL 3   ezetimibe (ZETIA) 10 MG tablet Take 1 tablet (10 mg total) by mouth daily. 90 tablet 3   finasteride (PROSCAR) 5 MG tablet Take 5 mg by mouth at bedtime.      finasteride (PROSCAR) 5 MG tablet Take 1 tablet (5 mg total) by mouth daily. 90 tablet 3   fluticasone (FLONASE) 50 MCG/ACT nasal spray Place 2 sprays into both nostrils daily as needed for allergies or rhinitis.     metFORMIN (GLUCOPHAGE) 1000 MG tablet Take 1 tablet (1,000 mg total) by mouth 2 (two) times daily with a meal. 180 tablet 3   mycophenolate (CELLCEPT) 500 MG tablet Take 2 tablets (1,000 mg total) by mouth 2 (two) times daily. 360 tablet 0   nitroGLYCERIN (NITROSTAT) 0.4 MG SL tablet Place 1 tablet (0.4 mg total) under the tongue every 5 (five) minutes as needed for chest pain. 25 tablet 4   pantoprazole (PROTONIX) 20 MG tablet Take 1 tablet (20 mg total) by mouth daily. 90 tablet 3   predniSONE (DELTASONE) 10 MG tablet Take 1.5 tablets (15 mg total) by mouth daily. (Patient taking differently: Take 10 mg by mouth daily.) 180  tablet 0   sildenafil (VIAGRA) 100 MG tablet Take 1 tablet (100 mg total) by mouth daily as needed for erectile dysfunction. 10 tablet 11   tamsulosin (FLOMAX) 0.4 MG CAPS capsule Take 0.4 mg by mouth at bedtime.     tamsulosin (FLOMAX) 0.4 MG CAPS capsule Take 1 capsule (0.4 mg total) by mouth daily. 90 capsule 1   tiZANidine (ZANAFLEX) 4 MG tablet Take 1 tablet (4 mg total) by mouth every 6 (six) hours as needed for muscle spasm 30 tablet 0   VASCEPA 1 g capsule Take 2 capsules (2 g total) by mouth 2 (two)  times daily. 120 capsule 1   vitamin B-12 (CYANOCOBALAMIN) 1000 MCG tablet Take 1,000 mcg by mouth daily.     No current facility-administered medications on file prior to visit.    Allergies:  Allergies  Allergen Reactions   Covid-19 Mrna Vacc (Moderna) Other (See Comments)    Optic neuropathy after original 2 vaccines    Statins Other (See Comments)    Myositis    Vital Signs:  BP 116/65   Pulse (!) 52   Ht '5\' 9"'$  (1.753 m)   Wt 189 lb (85.7 kg)   SpO2 99%   BMI 27.91 kg/m   Neurological Exam: MENTAL STATUS including orientation to time, place, person, recent and remote memory, attention span and concentration, language, and fund of knowledge is normal.  Speech is not dysarthric.  CRANIAL NERVES: Normal conjugate, extra-ocular eye movements in all directions of gaze.  No ptosis bilaterally. Facial muscles 5/5.   MOTOR:  Motor strength is 5/5 proximally, R hand is 13/5 (old from stroke). No atrophy, fasciculations or abnormal movements.  No pronator drift.   COORDINATION/GAIT:   Gait narrow based and stable. Easily able to stand up from chair without using arms.  Data: MRI cervical spine without contrast 02/17/2019: 1. 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 2. Solid anterior fusion at C4-5 and C5-6 without spinal stenosis at these levels. 3. Adjacent segment degenerative changes with moderate spinal stenosis at C6-7, mild-to-moderate spinal stenosis at C3-4, and mild-to-moderate neural foraminal stenosis at both levels.   MRI lumbar spine 02/17/2019: 1. Relatively diffuse lumbar disc and facet degeneration without significant spinal stenosis. 2. Multilevel neural foraminal stenosis, moderate on the left at L5-S1. 3. Normal appearance of the conus medullaris and cauda equina.   MRI thoracic spine without contrast 02/17/2019: 1. Mild thoracic disc and facet degeneration without evidence of neural impingement. 2. Minimal prominence of the central canal of the mid to lower thoracic spinal cord  without syrinx.  Left rectus femorus muscle biopsy 02/20/2019:  Active necrotizing inflammatory myopathy. Right chest wall skin biopsy:  Benign skin with fibrosis and mixed superficial and deep inflammation  Neuromuscular Lab - [facility] 03/20/2019:  Myopathy 2 panel - HMGCR 36,000 (normal <2500)  Lab Results  Component Value Date   CKTOTAL 677 (HH) 10/07/2022   CKMB CANCELED 03/21/2020   CKMBINDEX 23.6 (HH) 04/15/2021   TROPONINI 0.03 (HH) 02/17/2019    Lab Results  Component Value Date   HGBA1C 7.7 (H) 05/21/2022     Thank you for allowing me to participate in patient's care.  If I can answer any additional questions, I would be pleased to do so.    Sincerely,    Latressa Harries K. Posey Pronto, DO

## 2022-10-15 ENCOUNTER — Other Ambulatory Visit (HOSPITAL_COMMUNITY): Payer: Self-pay

## 2022-10-21 ENCOUNTER — Other Ambulatory Visit (HOSPITAL_COMMUNITY): Payer: Self-pay

## 2022-10-22 ENCOUNTER — Other Ambulatory Visit (HOSPITAL_COMMUNITY): Payer: Self-pay

## 2022-10-25 ENCOUNTER — Other Ambulatory Visit: Payer: Self-pay

## 2022-10-25 ENCOUNTER — Other Ambulatory Visit (HOSPITAL_BASED_OUTPATIENT_CLINIC_OR_DEPARTMENT_OTHER): Payer: Self-pay

## 2022-10-25 DIAGNOSIS — G720 Drug-induced myopathy: Secondary | ICD-10-CM

## 2022-10-25 DIAGNOSIS — I2510 Atherosclerotic heart disease of native coronary artery without angina pectoris: Secondary | ICD-10-CM

## 2022-10-25 DIAGNOSIS — E7800 Pure hypercholesterolemia, unspecified: Secondary | ICD-10-CM

## 2022-10-25 MED ORDER — REPATHA SURECLICK 140 MG/ML ~~LOC~~ SOAJ
1.0000 mL | SUBCUTANEOUS | 3 refills | Status: DC
  Filled 2022-10-25: qty 6, 84d supply, fill #0
  Filled 2023-01-16: qty 6, 84d supply, fill #1

## 2022-10-26 ENCOUNTER — Other Ambulatory Visit: Payer: Self-pay

## 2022-10-26 ENCOUNTER — Other Ambulatory Visit (HOSPITAL_COMMUNITY): Payer: Self-pay

## 2022-11-01 ENCOUNTER — Other Ambulatory Visit (HOSPITAL_COMMUNITY): Payer: Self-pay

## 2022-11-02 ENCOUNTER — Other Ambulatory Visit (HOSPITAL_COMMUNITY): Payer: Self-pay

## 2022-11-08 ENCOUNTER — Other Ambulatory Visit (HOSPITAL_COMMUNITY): Payer: Self-pay

## 2022-11-11 ENCOUNTER — Other Ambulatory Visit (HOSPITAL_COMMUNITY): Payer: Self-pay

## 2022-11-11 ENCOUNTER — Encounter: Payer: Self-pay | Admitting: Family Medicine

## 2022-11-11 DIAGNOSIS — R739 Hyperglycemia, unspecified: Secondary | ICD-10-CM

## 2022-11-15 ENCOUNTER — Other Ambulatory Visit: Payer: Self-pay | Admitting: Family Medicine

## 2022-11-16 LAB — COMPREHENSIVE METABOLIC PANEL
ALT: 68 IU/L — ABNORMAL HIGH (ref 0–44)
AST: 52 IU/L — ABNORMAL HIGH (ref 0–40)
Albumin/Globulin Ratio: 2 (ref 1.2–2.2)
Albumin: 4.7 g/dL (ref 3.9–4.9)
Alkaline Phosphatase: 61 IU/L (ref 44–121)
BUN/Creatinine Ratio: 11 (ref 10–24)
BUN: 10 mg/dL (ref 8–27)
Bilirubin Total: 0.4 mg/dL (ref 0.0–1.2)
CO2: 24 mmol/L (ref 20–29)
Calcium: 10.5 mg/dL — ABNORMAL HIGH (ref 8.6–10.2)
Chloride: 98 mmol/L (ref 96–106)
Creatinine, Ser: 0.87 mg/dL (ref 0.76–1.27)
Globulin, Total: 2.3 g/dL (ref 1.5–4.5)
Glucose: 101 mg/dL — ABNORMAL HIGH (ref 70–99)
Potassium: 5 mmol/L (ref 3.5–5.2)
Sodium: 142 mmol/L (ref 134–144)
Total Protein: 7 g/dL (ref 6.0–8.5)
eGFR: 93 mL/min/{1.73_m2} (ref >59)

## 2022-11-16 LAB — CBC/DIFF AMBIGUOUS DEFAULT
Basophils Absolute: 0.1 10*3/uL (ref 0.0–0.2)
Basos: 1 %
EOS (ABSOLUTE): 0.1 10*3/uL (ref 0.0–0.4)
Eos: 1 %
Hematocrit: 45.3 % (ref 37.5–51.0)
Hemoglobin: 15.1 g/dL (ref 13.0–17.7)
Immature Grans (Abs): 0 10*3/uL (ref 0.0–0.1)
Immature Granulocytes: 0 %
Lymphocytes Absolute: 1.8 10*3/uL (ref 0.7–3.1)
Lymphs: 24 %
MCH: 29.3 pg (ref 26.6–33.0)
MCHC: 33.3 g/dL (ref 31.5–35.7)
MCV: 88 fL (ref 79–97)
Monocytes Absolute: 0.7 10*3/uL (ref 0.1–0.9)
Monocytes: 9 %
Neutrophils Absolute: 4.7 10*3/uL (ref 1.4–7.0)
Neutrophils: 65 %
Platelets: 230 10*3/uL (ref 150–450)
RBC: 5.16 x10E6/uL (ref 4.14–5.80)
RDW: 13.7 % (ref 11.6–15.4)
WBC: 7.3 10*3/uL (ref 3.4–10.8)

## 2022-11-16 LAB — SPECIMEN STATUS REPORT

## 2022-11-17 LAB — SPECIMEN STATUS REPORT

## 2022-11-17 LAB — HGB A1C W/O EAG: Hgb A1c MFr Bld: 6.7 % — ABNORMAL HIGH (ref 4.8–5.6)

## 2022-11-19 ENCOUNTER — Ambulatory Visit (INDEPENDENT_AMBULATORY_CARE_PROVIDER_SITE_OTHER): Payer: Medicare Other | Admitting: Family Medicine

## 2022-11-19 ENCOUNTER — Encounter: Payer: Self-pay | Admitting: Family Medicine

## 2022-11-19 VITALS — BP 100/60 | HR 51 | Temp 97.0°F | Ht 69.0 in | Wt 190.8 lb

## 2022-11-19 DIAGNOSIS — I25118 Atherosclerotic heart disease of native coronary artery with other forms of angina pectoris: Secondary | ICD-10-CM

## 2022-11-19 DIAGNOSIS — R739 Hyperglycemia, unspecified: Secondary | ICD-10-CM

## 2022-11-19 DIAGNOSIS — R7401 Elevation of levels of liver transaminase levels: Secondary | ICD-10-CM

## 2022-11-19 DIAGNOSIS — T380X5A Adverse effect of glucocorticoids and synthetic analogues, initial encounter: Secondary | ICD-10-CM

## 2022-11-19 DIAGNOSIS — E785 Hyperlipidemia, unspecified: Secondary | ICD-10-CM

## 2022-11-19 NOTE — Progress Notes (Signed)
Phone [phone] In person visit   Subjective:   Stanley Boyd is a 72 y.o. year old very pleasant male patient who presents for/with See problem oriented charting Chief Complaint  Patient presents with   Follow-up    (NO MASK)   Hyperlipidemia   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    Priority: High   CAD (coronary artery disease), native coronary artery 07/13/2015    Priority: High   S/P PTCA (percutaneous transluminal coronary angioplasty) 07/08/2015    Priority: High   Elevated PSA 02/25/2017    Priority: Medium    Hemiparesis ([place]) 09/18/2014    Priority: Medium    BPH (benign prostatic hyperplasia) 09/18/2014    Priority: Medium    Hyperlipidemia     Priority: Medium    Bradycardia     Priority: Medium    Polyp of sigmoid colon     Priority: Low   B12 deficiency 02/26/2019    Priority: Low   History of colonic polyps 10/15/2014    Priority: Low   Transaminitis 09/18/2014    Priority: Low   Ptosis of eyelid, right 10/02/2020   Other optic atrophy, 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   Peripheral vision loss, bilateral 05/26/2020   Unspecified papilledema 05/26/2020   Vision changes 02/12/2020   Proximal muscle weakness 02/26/2019    Medications- reviewed and updated Current Outpatient Medications  Medication Sig Dispense Refill   apixaban (ELIQUIS) 5 MG TABS tablet Take 1 tablet (5 mg total) by mouth 2 (two) times daily. 180 tablet 1   Carboxymethylcellulose Sodium (REFRESH TEARS OP) Place 1 drop into both eyes daily as needed (dry eyes).      colchicine 0.6 MG tablet Take 1 tablet (0.6 mg total) by mouth daily as needed (psuedogout pain). 30 tablet 2   empagliflozin (JARDIANCE) 10 MG TABS tablet Take 1 tablet (10 mg total) by mouth daily before breakfast. 30 tablet 5    Evolocumab (REPATHA SURECLICK) [id number] MG/ML SOAJ Inject 140 mg into the skin every 14 (fourteen) days. 6 mL 3   ezetimibe (ZETIA) 10 MG tablet Take 1 tablet (10 mg total) by mouth daily. 90 tablet 3   finasteride (PROSCAR) 5 MG tablet Take 1 tablet (5 mg total) by mouth daily. 90 tablet 3   fluticasone (FLONASE) 50 MCG/ACT nasal spray Place 2 sprays into both nostrils daily as needed for allergies or rhinitis.     metFORMIN (GLUCOPHAGE) 1000 MG tablet Take 1 tablet (1,000 mg total) by mouth 2 (two) times daily with a meal. 180 tablet 3   mycophenolate (CELLCEPT) 500 MG tablet Take 2 tablets (1,000 mg total) by mouth 2 (two) times daily. 360 tablet 0   nitroGLYCERIN (NITROSTAT) 0.4 MG SL tablet Place 1 tablet (0.4 mg total) under the tongue every 5 (five) minutes as needed for chest pain. 25 tablet 4   pantoprazole (PROTONIX) 20 MG tablet Take 1 tablet (20 mg total) by mouth daily. 90 tablet 3   predniSONE (DELTASONE) 5 MG tablet Take 1 tablet (5 mg total) by mouth daily with breakfast. 90 tablet 2   sildenafil (VIAGRA) 100 MG tablet Take 1 tablet (100 mg total) by mouth daily as needed for erectile dysfunction. 10 tablet 11   tamsulosin (FLOMAX) 0.4 MG CAPS capsule Take 1 capsule (0.4 mg total) by mouth  daily. 90 capsule 1   tiZANidine (ZANAFLEX) 4 MG tablet Take 1 tablet (4 mg total) by mouth every 6 (six) hours as needed for muscle spasm 30 tablet 0   VASCEPA 1 g capsule Take 2 capsules (2 g total) by mouth 2 (two) times daily. 120 capsule 1   vitamin B-12 (CYANOCOBALAMIN) 1000 MCG tablet Take 1,000 mcg by mouth daily.     No current facility-administered medications for this visit.     Objective:  BP 100/60   Pulse (!) 51   Temp (!) 97 F (36.1 C)   Ht 5' 9"$  (1.753 m)   Wt 190 lb 12.8 oz (86.5 kg)   SpO2 99%   BMI 28.18 kg/m  Gen: NAD, resting comfortably CV: RRR no murmurs rubs or gallops Lungs: CTAB no crackles, wheeze, rhonchi Ext: trace edema Skin: warm, dry     Assessment and  Plan   #social update- has been able to be out hunting without issues. Has even walked 3 miles looking over a property with a friend.   # Autoimmune necrotizing myopathy S: Medication: CellCept 1000 mg twice daily, down February 1st to just 5 mg prednisone down from 30m alternating with 10 mg.  No longer on IVIG  A/P: has upcoming CK level in march with neurology and then further follow up- seems to be doing well on current medicine    # Optic neuropathy bilateral eyes S: [facility]  Required high-dose steroids in the past potentially [facility] and with severe symptoms around time of vaccination-we have opted to defer further COVID-19 vaccination as result -Symptoms:no vision issues despite decreasing prednisone  A/P: doing well no recurrence- continue current medications    # Hypercalcemia-seem to improve with reducing calcium intake last year but elevated again on labs at this time- still taking 6068ma day (had been taking due to prednisone) - we opted to stop home calcium and recheck next visit  # Steroid-induced hyperglycemia S: Medication:Metformin 1000 mg twice daily, jardiance 10 mg CBGs- reducing prednisone plus start of jardiance has helped Lab Results  Component Value Date   HGBA1C 6.7 (H) 11/15/2022   HGBA1C 7.7 (H) 05/21/2022   HGBA1C 7.1 (H) 01/25/2022   A/P: a1c improved- continue current medications . Will monitor next visit- may need to even reduce medication depending on trend coming off of prednisone   # Atrial fibrillation-follows with Dr. GaEinar Giprelated to cataract surgery S: Rate controlled with no medication Anticoagulated with Eliquis 5 mg twice daily A/P:  appropriately anticoagulated and rate controlled (not new medication at present)- continue current medicine  # CAD-follows with Dr. GaEinar Gip#hyperlipidemia S: Medication:Eliquis 5 mg twice daily (off aspirin), Vascepa,  Praluent 150 mg every 2 weeks, Zetia 10 mg - History of mild LFT elevations including on most recent labs -no chest pain or shortness of breath or abnormal fatigue (his typical presenting symptom) Lab Results  Component Value Date   CHOL 106 01/25/2022   HDL 54.60 01/25/2022   LDLCALC 28 01/25/2022   LDLDIRECT 58 02/26/2020   TRIG 120.0 01/25/2022   CHOLHDL 2 01/25/2022   A/P: CAD asymptomatic continue current medications Lipids at goal- repeat at next visit  # Elevated TSH-normal most recent check and we opted to check at least yearly  Lab Results  Component Value Date   TSH 1.53 01/25/2022   #had home wellness visit- possible blood flow issue- they will bring report-  consider SBI  #Still on oral b12  #sees urology in march- does not need PSA with [place]  Recommended follow up: Return in about 6 months (around 05/20/2023) for physical or sooner if needed.Schedule b4 you leave. Future Appointments  Date Time Provider [place]  12/15/2022  8:50 AM Narda Amber K, DO [facility] None  02/25/2023 11:00 AM Adrian Prows, MD [facility] None  07/04/2023  1:00 PM [facility] HEALTH COACH [facility]    Lab/Order associations:   ICD-10-CM   1. Coronary artery disease involving native coronary artery of native heart with other form of angina pectoris ([place])  I25.118     2. Steroid-induced hyperglycemia  R73.9 Hemoglobin A1c   T38.0X5A     3. Hyperlipidemia, unspecified hyperlipidemia type  E78.5 CBC with Differential/Platelet    Comprehensive metabolic panel    Lipid panel    TSH    4. Transaminitis  R74.01      No orders of the defined types were placed in this encounter.  Return precautions advised.  Garret Reddish, MD

## 2022-11-19 NOTE — Patient Instructions (Addendum)
Stop all calcium supplements- recheck next visit  Team please print labs for him to bring to [facility] before next visit  Recommended follow up: Return in about 6 months (around 05/20/2023) for physical or sooner if needed.Schedule b4 you leave. August 11th or later.

## 2022-11-29 ENCOUNTER — Other Ambulatory Visit (HOSPITAL_COMMUNITY): Payer: Self-pay

## 2022-11-29 ENCOUNTER — Other Ambulatory Visit: Payer: Self-pay | Admitting: Family Medicine

## 2022-11-29 ENCOUNTER — Other Ambulatory Visit: Payer: Self-pay

## 2022-11-30 ENCOUNTER — Other Ambulatory Visit: Payer: Self-pay

## 2022-11-30 MED ORDER — METFORMIN HCL 1000 MG PO TABS
1000.0000 mg | ORAL_TABLET | Freq: Two times a day (BID) | ORAL | 3 refills | Status: DC
  Filled 2022-11-30: qty 180, 90d supply, fill #0
  Filled 2023-02-28: qty 180, 90d supply, fill #1
  Filled 2023-05-25: qty 180, 90d supply, fill #2

## 2022-11-30 MED ORDER — PANTOPRAZOLE SODIUM 20 MG PO TBEC
20.0000 mg | DELAYED_RELEASE_TABLET | Freq: Every day | ORAL | 3 refills | Status: DC
  Filled 2022-11-30: qty 90, 90d supply, fill #0
  Filled 2023-02-28: qty 90, 90d supply, fill #1
  Filled 2023-06-01: qty 90, 90d supply, fill #2
  Filled 2023-08-25: qty 90, 90d supply, fill #3

## 2022-12-02 ENCOUNTER — Other Ambulatory Visit: Payer: Self-pay

## 2022-12-02 ENCOUNTER — Encounter: Payer: Self-pay | Admitting: Family Medicine

## 2022-12-02 ENCOUNTER — Other Ambulatory Visit (HOSPITAL_COMMUNITY): Payer: Self-pay

## 2022-12-02 MED ORDER — EMPAGLIFLOZIN 10 MG PO TABS
10.0000 mg | ORAL_TABLET | Freq: Every day | ORAL | 3 refills | Status: DC
  Filled 2022-12-02 – 2022-12-09 (×2): qty 90, 90d supply, fill #0

## 2022-12-09 ENCOUNTER — Other Ambulatory Visit (HOSPITAL_COMMUNITY): Payer: Self-pay

## 2022-12-09 ENCOUNTER — Other Ambulatory Visit: Payer: Self-pay

## 2022-12-10 ENCOUNTER — Other Ambulatory Visit: Payer: Self-pay

## 2022-12-10 ENCOUNTER — Other Ambulatory Visit (HOSPITAL_COMMUNITY): Payer: Self-pay

## 2022-12-10 ENCOUNTER — Other Ambulatory Visit (HOSPITAL_BASED_OUTPATIENT_CLINIC_OR_DEPARTMENT_OTHER): Payer: Self-pay

## 2022-12-11 LAB — CK: Total CK: 707 U/L (ref 41–331)

## 2022-12-15 ENCOUNTER — Encounter: Payer: Self-pay | Admitting: Neurology

## 2022-12-15 ENCOUNTER — Ambulatory Visit: Payer: Medicare Other | Admitting: Neurology

## 2022-12-15 VITALS — BP 104/65 | HR 61 | Ht 69.0 in | Wt 192.0 lb

## 2022-12-15 DIAGNOSIS — G7249 Other inflammatory and immune myopathies, not elsewhere classified: Secondary | ICD-10-CM | POA: Diagnosis not present

## 2022-12-15 NOTE — Progress Notes (Signed)
Follow-up Visit   Date: 12/15/22   Stanley Boyd MRN: [id number] DOB: 1950-12-25    Stanley Boyd is a 72 y.o. 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  The patient was accompanied to the clinic by self.   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  Clinically, doing great without weakness.  He has been doing great on Cellcept '1000mg'$  BID and slow prednisone taper.  CK is stable, fluctuates around [phone]. Previously managed on a long course of IVIG (discontinued in 01/2021), prednisone, and Cellcept (increased to '1000mg'$  BID in Feb 2022) Continue Cellcept '1000mg'$  BID Taper prednisone to '5mg'$  alternating with 2.'5mg'$  x 2 months, then 2.'5mg'$  daily. Check CK in 2-3 months  2.  Bilateral optic neuropathy, stable.  Previously evaluated by [facility]  Return to clinic in 3 months   ------------------------------------------------------------------------ Interim History: He is here for follow-up visit.  He was able to taper prednisone down to '5mg'$  daily and reports no new weakness. CK is stable at 707, previously 750> 677.  He will be traveling to the beach in April and prefers to make medication changes slowly over the next few months.   Medications:  Current Outpatient Medications on File Prior to Visit  Medication Sig Dispense Refill   apixaban (ELIQUIS) 5 MG TABS tablet Take 1 tablet (5 mg total) by mouth 2 (two) times daily. 180 tablet 1   Carboxymethylcellulose Sodium (REFRESH TEARS OP) Place 1 drop into both eyes daily as needed (dry eyes).      empagliflozin (JARDIANCE) 10 MG TABS tablet Take 1 tablet (10 mg total) by mouth daily before breakfast. 90 tablet 3   Evolocumab (REPATHA SURECLICK) [id number] MG/ML SOAJ  Inject 140 mg into the skin every 14 (fourteen) days. 6 mL 3   ezetimibe (ZETIA) 10 MG tablet Take 1 tablet (10 mg total) by mouth daily. 90 tablet 3   finasteride (PROSCAR) 5 MG tablet Take 1 tablet (5 mg total) by mouth daily. 90 tablet 3   metFORMIN (GLUCOPHAGE) 1000 MG tablet Take 1 tablet (1,000 mg total) by mouth 2 (two) times daily with a meal. 180 tablet 3   mycophenolate (CELLCEPT) 500 MG tablet Take 2 tablets (1,000 mg total) by mouth 2 (two) times daily. 360 tablet 0   nitroGLYCERIN (NITROSTAT) 0.4 MG SL tablet Place 1 tablet (0.4 mg total) under the tongue every 5 (five) minutes as needed for chest pain. 25 tablet 4   pantoprazole (PROTONIX) 20 MG tablet Take 1 tablet (20 mg total) by mouth daily. 90 tablet 3   predniSONE (DELTASONE) 5 MG tablet Take 1 tablet (5 mg total) by mouth daily with breakfast. 90 tablet 2   sildenafil (VIAGRA) 100 MG tablet Take 1 tablet (100 mg total) by mouth daily as needed for erectile dysfunction. 10 tablet 11   tamsulosin (FLOMAX) 0.4 MG CAPS capsule Take 1 capsule (0.4 mg total) by mouth daily. 90 capsule 1   tiZANidine (ZANAFLEX) 4 MG tablet Take 1 tablet (4 mg total) by mouth every 6 (six) hours as needed for muscle spasm 30 tablet 0   VASCEPA 1 g capsule Take 2 capsules (2 g total) by mouth 2 (two) times daily. 120 capsule 1   No current facility-administered medications on file prior to visit.    Allergies:  Allergies  Allergen Reactions  Covid-19 Mrna Vacc (Moderna) Other (See Comments)    Optic neuropathy after original 2 vaccines    Statins Other (See Comments)    Myositis    Vital Signs:  BP 104/65   Pulse 61   Ht '5\' 9"'$  (1.753 m)   Wt 192 lb (87.1 kg)   SpO2 96%   BMI 28.35 kg/m   Neurological Exam: MENTAL STATUS including orientation to time, place, person, recent and remote memory, attention span and concentration, language, and fund of knowledge is normal.  Speech is not dysarthric.  CRANIAL NERVES: Normal conjugate,  extra-ocular eye movements in all directions of gaze.  No ptosis bilaterally.   MOTOR:  Motor strength is 5/5 proximally, R hand is 11/5 (old from stroke). No atrophy, fasciculations or abnormal movements.  No pronator drift.   COORDINATION/GAIT:   Gait not tested today.  Data: MRI cervical spine without contrast 02/17/2019: 1. 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 2. Solid anterior fusion at C4-5 and C5-6 without spinal stenosis at these levels. 3. Adjacent segment degenerative changes with moderate spinal stenosis at C6-7, mild-to-moderate spinal stenosis at C3-4, and mild-to-moderate neural foraminal stenosis at both levels.   MRI lumbar spine 02/17/2019: 1. Relatively diffuse lumbar disc and facet degeneration without significant spinal stenosis. 2. Multilevel neural foraminal stenosis, moderate on the left at L5-S1. 3. Normal appearance of the conus medullaris and cauda equina.   MRI thoracic spine without contrast 02/17/2019: 1. Mild thoracic disc and facet degeneration without evidence of neural impingement. 2. Minimal prominence of the central canal of the mid to lower thoracic spinal cord without syrinx.  Left rectus femorus muscle biopsy 02/20/2019:  Active necrotizing inflammatory myopathy. Right chest wall skin biopsy:  Benign skin with fibrosis and mixed superficial and deep inflammation  Neuromuscular Lab - [facility] 03/20/2019:  Myopathy 2 panel - HMGCR 36,000 (normal <2500)  Lab Results  Component Value Date   CKTOTAL 707 (HH) 12/10/2022   CKMB CANCELED 03/21/2020   CKMBINDEX 23.6 (HH) 04/15/2021   TROPONINI 0.03 (HH) 02/17/2019    Lab Results  Component Value Date   HGBA1C 6.7 (H) 11/15/2022     Thank you for allowing me to participate in patient's care.  If I can answer any additional questions, I would be pleased to do so.    Sincerely,    Koralee Wedeking K. Posey Pronto, DO

## 2022-12-15 NOTE — Patient Instructions (Addendum)
Reduce prednisone to '5mg'$  alternating with 2.'5mg'$  x 2 months.  When you return from the beach in May, you can lower it to 2.'5mg'$  daily.  Check CK prior to your next visit  I will see you back around the end of May/early June

## 2022-12-29 ENCOUNTER — Other Ambulatory Visit: Payer: Self-pay

## 2022-12-29 ENCOUNTER — Other Ambulatory Visit: Payer: Self-pay | Admitting: Cardiology

## 2022-12-29 ENCOUNTER — Other Ambulatory Visit (HOSPITAL_COMMUNITY): Payer: Self-pay

## 2022-12-29 DIAGNOSIS — E7800 Pure hypercholesterolemia, unspecified: Secondary | ICD-10-CM

## 2022-12-29 DIAGNOSIS — I2510 Atherosclerotic heart disease of native coronary artery without angina pectoris: Secondary | ICD-10-CM

## 2022-12-29 MED ORDER — TAMSULOSIN HCL 0.4 MG PO CAPS
0.4000 mg | ORAL_CAPSULE | Freq: Every day | ORAL | 3 refills | Status: DC
  Filled 2023-02-28: qty 90, 90d supply, fill #0
  Filled 2023-05-25: qty 90, 90d supply, fill #1
  Filled 2023-08-23: qty 90, 90d supply, fill #2
  Filled 2023-11-21: qty 90, 90d supply, fill #3

## 2022-12-29 MED ORDER — FINASTERIDE 5 MG PO TABS
5.0000 mg | ORAL_TABLET | Freq: Every day | ORAL | 3 refills | Status: DC
  Filled 2023-02-28: qty 90, 90d supply, fill #0
  Filled 2023-05-25: qty 90, 90d supply, fill #1
  Filled 2023-08-23: qty 90, 90d supply, fill #2
  Filled 2023-11-21: qty 90, 90d supply, fill #3

## 2022-12-29 MED ORDER — VASCEPA 1 G PO CAPS
2.0000 g | ORAL_CAPSULE | Freq: Two times a day (BID) | ORAL | 1 refills | Status: DC
  Filled 2022-12-29: qty 120, 30d supply, fill #0
  Filled 2023-01-26: qty 120, 30d supply, fill #1

## 2022-12-30 ENCOUNTER — Other Ambulatory Visit: Payer: Self-pay

## 2022-12-30 ENCOUNTER — Other Ambulatory Visit (HOSPITAL_COMMUNITY): Payer: Self-pay

## 2023-01-17 ENCOUNTER — Other Ambulatory Visit: Payer: Self-pay

## 2023-01-21 ENCOUNTER — Other Ambulatory Visit: Payer: Self-pay

## 2023-01-26 ENCOUNTER — Other Ambulatory Visit: Payer: Self-pay | Admitting: Family Medicine

## 2023-01-26 ENCOUNTER — Encounter: Payer: Self-pay | Admitting: Family Medicine

## 2023-01-26 ENCOUNTER — Other Ambulatory Visit: Payer: Self-pay

## 2023-01-26 ENCOUNTER — Other Ambulatory Visit (HOSPITAL_COMMUNITY): Payer: Self-pay

## 2023-01-26 MED ORDER — SILDENAFIL CITRATE 100 MG PO TABS
100.0000 mg | ORAL_TABLET | Freq: Every day | ORAL | 11 refills | Status: DC | PRN
  Filled 2023-01-27 – 2023-01-29 (×3): qty 10, 10d supply, fill #0
  Filled 2023-08-28: qty 10, 10d supply, fill #1

## 2023-01-26 MED ORDER — SILDENAFIL CITRATE 100 MG PO TABS
100.0000 mg | ORAL_TABLET | Freq: Every day | ORAL | 11 refills | Status: DC | PRN
  Filled 2023-01-26: qty 10, 10d supply, fill #0

## 2023-01-27 ENCOUNTER — Other Ambulatory Visit: Payer: Self-pay

## 2023-01-27 ENCOUNTER — Other Ambulatory Visit (HOSPITAL_COMMUNITY): Payer: Self-pay

## 2023-01-28 ENCOUNTER — Other Ambulatory Visit (HOSPITAL_COMMUNITY): Payer: Self-pay

## 2023-01-28 ENCOUNTER — Other Ambulatory Visit: Payer: Self-pay | Admitting: Cardiology

## 2023-01-28 ENCOUNTER — Other Ambulatory Visit: Payer: Self-pay

## 2023-01-28 DIAGNOSIS — I2510 Atherosclerotic heart disease of native coronary artery without angina pectoris: Secondary | ICD-10-CM

## 2023-01-28 DIAGNOSIS — E7800 Pure hypercholesterolemia, unspecified: Secondary | ICD-10-CM

## 2023-01-28 MED ORDER — VASCEPA 1 G PO CAPS
2.0000 g | ORAL_CAPSULE | Freq: Two times a day (BID) | ORAL | 1 refills | Status: DC
Start: 2023-01-28 — End: 2023-02-25
  Filled 2023-01-28 – 2023-02-07 (×4): qty 120, 30d supply, fill #0

## 2023-01-29 ENCOUNTER — Other Ambulatory Visit (HOSPITAL_COMMUNITY): Payer: Self-pay

## 2023-01-31 ENCOUNTER — Other Ambulatory Visit: Payer: Self-pay

## 2023-02-04 ENCOUNTER — Other Ambulatory Visit: Payer: Self-pay

## 2023-02-04 ENCOUNTER — Other Ambulatory Visit (HOSPITAL_COMMUNITY): Payer: Self-pay

## 2023-02-07 ENCOUNTER — Other Ambulatory Visit: Payer: Self-pay

## 2023-02-07 ENCOUNTER — Other Ambulatory Visit (HOSPITAL_COMMUNITY): Payer: Self-pay

## 2023-02-14 ENCOUNTER — Other Ambulatory Visit: Payer: Self-pay

## 2023-02-25 ENCOUNTER — Other Ambulatory Visit: Payer: Self-pay

## 2023-02-25 ENCOUNTER — Ambulatory Visit: Payer: Medicare Other | Admitting: Cardiology

## 2023-02-25 ENCOUNTER — Encounter: Payer: Self-pay | Admitting: Cardiology

## 2023-02-25 ENCOUNTER — Other Ambulatory Visit (HOSPITAL_COMMUNITY): Payer: Self-pay

## 2023-02-25 VITALS — BP 113/65 | HR 62 | Resp 16 | Ht 69.0 in | Wt 191.8 lb

## 2023-02-25 DIAGNOSIS — I2510 Atherosclerotic heart disease of native coronary artery without angina pectoris: Secondary | ICD-10-CM

## 2023-02-25 DIAGNOSIS — I480 Paroxysmal atrial fibrillation: Secondary | ICD-10-CM

## 2023-02-25 DIAGNOSIS — E119 Type 2 diabetes mellitus without complications: Secondary | ICD-10-CM

## 2023-02-25 DIAGNOSIS — G720 Drug-induced myopathy: Secondary | ICD-10-CM

## 2023-02-25 DIAGNOSIS — T466X5A Adverse effect of antihyperlipidemic and antiarteriosclerotic drugs, initial encounter: Secondary | ICD-10-CM

## 2023-02-25 DIAGNOSIS — E7800 Pure hypercholesterolemia, unspecified: Secondary | ICD-10-CM

## 2023-02-25 MED ORDER — VASCEPA 1 G PO CAPS
2.0000 g | ORAL_CAPSULE | Freq: Two times a day (BID) | ORAL | 3 refills | Status: DC
Start: 2023-02-25 — End: 2024-02-12
  Filled 2023-02-25: qty 360, 90d supply, fill #0
  Filled 2023-03-06: qty 120, 30d supply, fill #0
  Filled 2023-04-04: qty 120, 30d supply, fill #1
  Filled 2023-05-02: qty 120, 30d supply, fill #2
  Filled 2023-06-01: qty 120, 30d supply, fill #3
  Filled 2023-06-30: qty 120, 30d supply, fill #4
  Filled 2023-07-28: qty 120, 30d supply, fill #5
  Filled 2023-08-25: qty 120, 30d supply, fill #6
  Filled 2023-09-24: qty 120, 30d supply, fill #7
  Filled 2023-10-21: qty 120, 30d supply, fill #8
  Filled 2023-11-20: qty 120, 30d supply, fill #9
  Filled 2023-12-20: qty 120, 30d supply, fill #10
  Filled 2024-01-18: qty 120, 30d supply, fill #11

## 2023-02-25 MED ORDER — APIXABAN 5 MG PO TABS
5.0000 mg | ORAL_TABLET | Freq: Two times a day (BID) | ORAL | 3 refills | Status: DC
Start: 2023-02-25 — End: 2024-04-13
  Filled 2023-02-25 – 2023-04-20 (×2): qty 180, 90d supply, fill #0
  Filled 2023-07-19: qty 180, 90d supply, fill #1
  Filled 2023-10-12: qty 180, 90d supply, fill #2
  Filled 2024-01-11: qty 180, 90d supply, fill #3

## 2023-02-25 MED ORDER — EMPAGLIFLOZIN 10 MG PO TABS
10.0000 mg | ORAL_TABLET | Freq: Every day | ORAL | 3 refills | Status: DC
Start: 2023-02-25 — End: 2023-12-19
  Filled 2023-02-25 – 2023-03-14 (×2): qty 90, 90d supply, fill #0
  Filled 2023-06-08: qty 90, 90d supply, fill #1
  Filled 2023-09-06: qty 90, 90d supply, fill #2
  Filled 2023-12-05: qty 90, 90d supply, fill #3

## 2023-02-25 MED ORDER — REPATHA SURECLICK 140 MG/ML ~~LOC~~ SOAJ
1.0000 mL | SUBCUTANEOUS | 3 refills | Status: DC
Start: 2023-02-25 — End: 2024-03-07
  Filled 2023-02-25 – 2023-04-07 (×2): qty 6, 84d supply, fill #0
  Filled 2023-07-07: qty 6, 84d supply, fill #1
  Filled 2023-09-16: qty 6, 84d supply, fill #2
  Filled 2023-12-17 – 2023-12-18 (×2): qty 6, 84d supply, fill #3

## 2023-02-25 NOTE — Progress Notes (Signed)
Primary Physician/Referring:  Shelva Majestic, MD  Patient ID: Stanley Boyd, male    DOB: [birth year], 72 y.o.   MRN: [id number]  Chief Complaint  Patient presents with   Coronary Artery Disease   Atrial Fibrillation   Follow-up    1 year   HPI:    Stanley Boyd  is a 72 y.o. Caucasian male with CAD, staged PCI, first on 07/08/2015 with 3.0x38 & 3.0x61mm Resolute DES to prox and mid LAD, then on 07/17/2015 with stenting of the bifurcating large OM1 and proximal circumflex with implantation of 3.0 x 28 mm in the circumflex and 3.0 x 24 mm Synergy DES with [employer]. 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    He is asymptomatic, he has not had any angina, dyspnea, fatigue.  Steroid dose has been reduced and he states that it will probably be weaned off with regard to his myopathy  Past Medical History:  Diagnosis Date   BPH (benign prostatic hyperplasia)    Bradycardia    40s to low 50s   Chronic back pain    multiple surgeries following fall from a deer stand   Coronary artery disease    5 stents   Headache    "probably monthly" (07/08/2015)   Hyperlipidemia    on injectable medication   Kidney stones    "years ago; never had OR/scopes"   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   Neuromuscular disorder (HCC)    Pneumonia 08/2014    Social History   Tobacco Use   Smoking status: Never   Smokeless tobacco: Never  Substance Use Topics   Alcohol use: Not Currently   Marital Status: Married   ROS  Review of Systems  Cardiovascular:  Negative for chest pain, dyspnea on exertion and leg swelling.    Objective      02/25/2023   10:53 AM 12/15/2022    8:35 AM 11/19/2022   11:02 AM  Vitals with BMI  Height 5\' 9"  5\' 9"  5\' 9"   Weight 191 lbs 13 oz 192 lbs 190 lbs 13 oz  BMI 28.31  28.34 28.16  Systolic 113 104 829  Diastolic 65 65 60  Pulse 62 61 51    Blood pressure 113/65, pulse 62, resp. rate 16, height 5\' 9"  (1.753 m), weight 191 lb 12.8 oz (87 kg), SpO2 98 %. Body mass index is 28.32 kg/m.   Physical Exam Neck:     Vascular: No carotid bruit or JVD.  Cardiovascular:     Rate and Rhythm: Normal rate and regular rhythm.     Pulses: Intact distal pulses.     Heart sounds: Normal heart sounds. No murmur heard.    No gallop.  Pulmonary:     Effort: Pulmonary effort is normal.     Breath sounds: Normal breath sounds.  Abdominal:     General: Bowel sounds are normal.     Palpations: Abdomen is soft.  Musculoskeletal:     Right lower leg: No edema.     Left lower leg: No edema.     Laboratory examination:   Recent Labs    05/21/22 1214 11/15/22 1152  NA 137 142  K 4.3 5.0  CL 100 98  CO2 30 24  GLUCOSE 136* 101*  BUN 13 10  CREATININE 0.88 0.87  CALCIUM 9.6  10.5*      Latest Ref Rng & Units 11/15/2022   11:52 AM 05/21/2022   12:14 PM 01/25/2022   11:51 AM  CMP  Glucose 70 - 99 mg/dL 161  096  045   BUN 8 - 27 mg/dL 10  13  13    Creatinine 0.76 - 1.27 mg/dL 4.09  8.11  9.14   Sodium 134 - 144 mmol/L 142  137  138   Potassium 3.5 - 5.2 mmol/L 5.0  4.3  4.3   Chloride 96 - 106 mmol/L 98  100  101   CO2 20 - 29 mmol/L 24  30  29    Calcium 8.6 - 10.2 mg/dL 78.2  9.6  9.5   Total Protein 6.0 - 8.5 g/dL 7.0  6.4  6.1   Total Bilirubin 0.0 - 1.2 mg/dL 0.4  0.4  0.5   Alkaline Phos 44 - 121 IU/L 61  39  39   AST 0 - 40 IU/L 52  29  33   ALT 0 - 44 IU/L 68  43  51       Latest Ref Rng & Units 11/15/2022   11:52 AM 05/21/2022   12:14 PM 02/25/2022    8:29 AM  CBC  WBC 3.4 - 10.8 x10E3/uL 7.3  7.9  9.1   Hemoglobin 13.0 - 17.7 g/dL 95.6  21.3  08.6   Hematocrit 37.5 - 51.0 % 45.3  40.3  38.0   Platelets 150 - 450 x10E3/uL 230  183.0  196.0    Lab Results  Component Value Date   CHOL 106 01/25/2022   HDL 54.60 01/25/2022   LDLCALC 28  01/25/2022   LDLDIRECT 58 02/26/2020   TRIG 120.0 01/25/2022   CHOLHDL 2 01/25/2022     HEMOGLOBIN A1C Lab Results  Component Value Date   HGBA1C 6.7 (H) 11/15/2022   MPG 146 08/07/2020   TSH Lab Results  Component Value Date   TSH 1.53 01/25/2022   Lab Results  Component Value Date   HGBA1C 6.7 (H) 11/15/2022    Radiology   No results found.  Cardiac Studies:   Echocardiogram 04/092020 :   1. The left ventricle has normal systolic function, with an ejection fraction of 60-65%. The cavity size was normal. Left ventricular diastolic Doppler parameters are consistent with impaired relaxation.  2. The right ventricle has normal systolc function. The cavity was normal. There is no increase in right ventricular wall thickness.  Ambulatory cardiac telemetry 3 days (12/01/2021 - 12/04/2021): Predominant underlying rhythm was sinus.  2 episodes of ventricular tachycardia longest lasting 5 beats, both asymptomatic.  Episodes of supraventricular tachycardia longest lasting 18 beats with a maximum heart rate of 210 bpm.  Atrial fibrillation burden <1%, longest lasting episode 8 minutes and 6 seconds.  Frequent PACs with PAC burden of 5.4%, rare PVCs.  Ventricular trigeminy was present.  There were no patient triggered events.  PCV MYOCARDIAL PERFUSION WO LEXISCAN 01/18/2022  Exercise nuclear stress test was performed using Bruce protocol. Patient reached 7 METS, and 103% of age predicted maximum heart rate. Exercise capacity was low. No chest pain reported. Heart rate and hemodynamic response were normal. Stress EKG showed sinus tachycardia, 1.5-2 mm horizontal/ upsloping ST depressions in leads V4-V6, 1 mm downsloping ST depression in leads II, III, aVF, that partially normalize 2 min into recovery. Frequent PVC's seen during recovery. Normal myocardial perfusion. Mild global decrease in myocardial wall motion and thickening. Rest EF 40%, stress EF 49%. No  significant ischemia on SPECT  imaging. Intermediate risk study due to EKG changes and low rest/stress LVEF.  Left Heart Catheterization 02/09/22:  LV: Hemodynamics: 101/0, EDP 10 mm Hg. Ao 122/54, mean 78 mmHg.  No pressure gradient across aortic valve. Angiographic data: Normal LVEF, no significant mitral regurgitation. LM: Large vessel, smooth and normal. LAD: Large vessel.  Proximal and mid LAD 3.0 x 38 overlapped with a 3.5 x 15 mm resolute DES placed 07/08/2015 is widely patent.  The surgeon to moderate to large sized D1 and small D2. LCx: Large vessel.  Proximal circumflex-large OM1 T stenting 04/16/2016 with 3.0 x 28 and a 3.0 x 24 mm Synergy DES is widely patent.  Distal circumflex has minimal disease. RCA: Large vessel and a dominant vessel.  Ostium has a 30 to 40% stenosis which is new from previous angiography in 2021    Impression: No significant coronary disease to explain his symptoms, abnormal EKG response on stress test is false positive.  LVEF is normal.  40 mL contrast utilized.   EKG   EKG 02/25/2023: Normal sinus rhythm/sinus bradycardia rate of 49 bpm, left atrial enlargement, nonspecific lateral T wave abnormality, LVH with repolarization abnormality.  No significant change from 12/23/2021.  Allergies   Allergies  Allergen Reactions   Covid-19 Mrna Vacc (Moderna) Other (See Comments)    Optic neuropathy after original 2 vaccines    Statins Other (See Comments)    Myositis    Current Outpatient Medications:    Carboxymethylcellulose Sodium (REFRESH TEARS OP), Place 1 drop into both eyes daily as needed (dry eyes). , Disp: , Rfl:    finasteride (PROSCAR) 5 MG tablet, Take 1 tablet (5 mg total) by mouth daily., Disp: 90 tablet, Rfl: 3   metFORMIN (GLUCOPHAGE) 1000 MG tablet, Take 1 tablet (1,000 mg total) by mouth 2 (two) times daily with a meal., Disp: 180 tablet, Rfl: 3   mycophenolate (CELLCEPT) 500 MG tablet, Take 2 tablets (1,000 mg total) by mouth 2 (two) times daily., Disp: 360 tablet, Rfl:  0   nitroGLYCERIN (NITROSTAT) 0.4 MG SL tablet, Place 1 tablet (0.4 mg total) under the tongue every 5 (five) minutes as needed for chest pain., Disp: 25 tablet, Rfl: 4   pantoprazole (PROTONIX) 20 MG tablet, Take 1 tablet (20 mg total) by mouth daily., Disp: 90 tablet, Rfl: 3   predniSONE (DELTASONE) 5 MG tablet, Take 1 tablet (5 mg total) by mouth daily with breakfast. (Patient taking differently: Take 2.5 mg by mouth daily with breakfast.), Disp: 90 tablet, Rfl: 2   sildenafil (VIAGRA) 100 MG tablet, Take 1 tablet (100 mg total) by mouth daily as needed for erectile dysfunction., Disp: 10 tablet, Rfl: 11   tamsulosin (FLOMAX) 0.4 MG CAPS capsule, Take 1 capsule (0.4 mg total) by mouth daily., Disp: 90 capsule, Rfl: 3   tiZANidine (ZANAFLEX) 4 MG tablet, Take 1 tablet (4 mg total) by mouth every 6 (six) hours as needed for muscle spasm, Disp: 30 tablet, Rfl: 0   apixaban (ELIQUIS) 5 MG TABS tablet, Take 1 tablet (5 mg total) by mouth 2 (two) times daily., Disp: 180 tablet, Rfl: 3   empagliflozin (JARDIANCE) 10 MG TABS tablet, Take 1 tablet (10 mg total) by mouth daily before breakfast., Disp: 90 tablet, Rfl: 3   Evolocumab (REPATHA SURECLICK) 140 MG/ML SOAJ, Inject 140 mg into the skin every 14 (fourteen) days., Disp: 6 mL, Rfl: 3   VASCEPA 1 g capsule, Take 2 capsules (2 g total) by mouth 2 (  two) times daily., Disp: 360 capsule, Rfl: 3   Assessment     ICD-10-CM   1. Coronary artery disease involving native coronary artery of native heart without angina pectoris  I25.10 EKG 12-Lead    empagliflozin (JARDIANCE) 10 MG TABS tablet    Evolocumab (REPATHA SURECLICK) 140 MG/ML SOAJ    VASCEPA 1 g capsule    2. Paroxysmal atrial fibrillation (HCC)  I48.0 apixaban (ELIQUIS) 5 MG TABS tablet    3. Hypercholesteremia  E78.00 Evolocumab (REPATHA SURECLICK) 140 MG/ML SOAJ    VASCEPA 1 g capsule    4. Statin myopathy  G72.0 Evolocumab (REPATHA SURECLICK) 140 MG/ML SOAJ   T46.6X5A     5. Controlled  type 2 diabetes mellitus without complication, without long-term current use of insulin (HCC)  E11.9 empagliflozin (JARDIANCE) 10 MG TABS tablet      This patients CHA2DS2-VASc Score 3 (CAD, Age, DM) and yearly risk of stroke 3.2%.   Recommendations:   Stanley Boyd  is a 72 y.o. Caucasian male with CAD, staged PCI, first on 07/08/2015 with 3.0x38 & 3.0x92mm Resolute DES to prox and mid LAD, then on 07/17/2015 with stenting of the bifurcating large OM1 and proximal circumflex with implantation of 3.0 x 28 mm in the circumflex and 3.0 x 24 mm Synergy DES with [employer].  Past medical history significant for hypercholesterolemia, chronic elevated LFTs and statin intolerance with h/o necrotizing myositis due to  HMGCR antibodies (02/15/2019), diabetes mellitus and PAF.    1. Coronary artery disease involving native coronary artery of native heart without angina pectoris Patient remained stable without recurrence of angina pectoris.  He is on appropriate medical therapy, unable to tolerate beta-blocker due to marked sinus bradycardia and he is not on an ACE inhibitor or an ARB due to low blood pressure.  He is diabetic however her diabetes is well-controlled and has been precipitated by use of steroids for his myopathy.  2. Paroxysmal atrial fibrillation (HCC) He has not had any further palpitations, is maintaining sinus rhythm, also tolerating anticoagulation well.  I reviewed his external labs, normal renal function and CBC.  3. Hypercholesteremia I reviewed his labs, lipids are controlled, he is to have hypertriglyceridemia which is now controlled as well, continue omega-3 fatty acid along with Repatha, I will discontinue Zetia.  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  4. 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  Overall stable from cardiac standpoint, external labs reviewed, I will see him back in a year or sooner if problems.     Yates Decamp, MD, [facility] 02/25/2023, 11:14 AM Office: [phone] Fax: [phone] Pager: [phone]

## 2023-03-01 ENCOUNTER — Other Ambulatory Visit: Payer: Self-pay

## 2023-03-07 ENCOUNTER — Other Ambulatory Visit: Payer: Self-pay

## 2023-03-08 ENCOUNTER — Other Ambulatory Visit: Payer: Self-pay

## 2023-03-15 ENCOUNTER — Other Ambulatory Visit: Payer: Self-pay

## 2023-03-15 LAB — CK: Total CK: 637 U/L (ref 41–331)

## 2023-03-21 ENCOUNTER — Ambulatory Visit: Payer: Medicare Other | Admitting: Neurology

## 2023-03-21 ENCOUNTER — Encounter: Payer: Self-pay | Admitting: Neurology

## 2023-03-21 ENCOUNTER — Other Ambulatory Visit (HOSPITAL_COMMUNITY): Payer: Self-pay

## 2023-03-21 VITALS — BP 127/66 | HR 57 | Ht 69.0 in | Wt 188.0 lb

## 2023-03-21 DIAGNOSIS — G7249 Other inflammatory and immune myopathies, not elsewhere classified: Secondary | ICD-10-CM | POA: Diagnosis not present

## 2023-03-21 DIAGNOSIS — Z5181 Encounter for therapeutic drug level monitoring: Secondary | ICD-10-CM | POA: Diagnosis not present

## 2023-03-21 MED ORDER — MYCOPHENOLATE MOFETIL 500 MG PO TABS
1000.0000 mg | ORAL_TABLET | Freq: Two times a day (BID) | ORAL | 3 refills | Status: DC
  Filled 2023-03-21: qty 360, 90d supply, fill #0
  Filled 2023-07-07: qty 360, 90d supply, fill #1
  Filled 2023-10-02: qty 360, 90d supply, fill #2

## 2023-03-21 NOTE — Patient Instructions (Signed)
Reduce prednisone as follows: Week 1-2:  Monday, Wednesday, Friday 2.5mg  Week 3-4:  Monday & Friday 2.5mg  Week 5-6:  Monday 2.5mg   Check CK prior to next visit  I'll see you back in 3 months

## 2023-03-21 NOTE — Progress Notes (Signed)
Follow-up Visit   Date: 03/21/23   Glennis Rauseo MRN: [id number] DOB: 1951/03/21    Stanley Boyd is a 72 y.o. 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  The patient was accompanied to the clinic by self.   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  Clinically, doing great without weakness. He has been doing great on Cellcept 1000mg  BID and slow prednisone taper.  CK is stable, fluctuates around [phone]. Previously managed on a long course of IVIG (discontinued in 01/2021), prednisone, and Cellcept (increased to 1000mg  BID in Feb 2022) Continue Cellcept 1000mg  BID Taper prednisone 2.5mg  MWF x 2 weeks, then Monday/Friday x 2 weeks, then Monday x 2 weeks, then stop Check CK in 2-3 months  2.  Bilateral optic neuropathy, stable.  Previously evaluated by [facility]  Return to clinic in 3 months   ------------------------------------------------------------------------ Interim History: He is here for follow-up visit.  He has been doing great and tells me that he has felt the best in a long time over the past 4 months.  He has been able to reduce prednisone to 2.5mg , which he has been on for the past month.  No new weakness.  CK continues to fluctuate ~ 600-700.    Medications:  Current Outpatient Medications on File Prior to Visit  Medication Sig Dispense Refill   apixaban (ELIQUIS) 5 MG TABS tablet Take 1 tablet (5 mg total) by mouth 2 (two) times daily. 180 tablet 3   Carboxymethylcellulose Sodium (REFRESH TEARS OP) Place 1 drop into both eyes daily as needed (dry eyes).      empagliflozin (JARDIANCE) 10 MG TABS tablet Take 1 tablet (10 mg total) by mouth daily before breakfast. 90 tablet 3   Evolocumab  (REPATHA SURECLICK) 140 MG/ML SOAJ Inject 140 mg into the skin every 14 (fourteen) days. 6 mL 3   finasteride (PROSCAR) 5 MG tablet Take 1 tablet (5 mg total) by mouth daily. 90 tablet 3   metFORMIN (GLUCOPHAGE) 1000 MG tablet Take 1 tablet (1,000 mg total) by mouth 2 (two) times daily with a meal. 180 tablet 3   nitroGLYCERIN (NITROSTAT) 0.4 MG SL tablet Place 1 tablet (0.4 mg total) under the tongue every 5 (five) minutes as needed for chest pain. 25 tablet 4   pantoprazole (PROTONIX) 20 MG tablet Take 1 tablet (20 mg total) by mouth daily. 90 tablet 3   predniSONE (DELTASONE) 5 MG tablet Take 1 tablet (5 mg total) by mouth daily with breakfast. (Patient taking differently: Take 2.5 mg by mouth daily with breakfast.) 90 tablet 2   sildenafil (VIAGRA) 100 MG tablet Take 1 tablet (100 mg total) by mouth daily as needed for erectile dysfunction. 10 tablet 11   tamsulosin (FLOMAX) 0.4 MG CAPS capsule Take 1 capsule (0.4 mg total) by mouth daily. 90 capsule 3   tiZANidine (ZANAFLEX) 4 MG tablet Take 1 tablet (4 mg total) by mouth every 6 (six) hours as needed for muscle spasm 30 tablet 0   VASCEPA 1 g capsule Take 2 capsules (2 g total) by mouth 2 (two) times daily. 360 capsule 3   No current facility-administered medications on file prior to visit.    Allergies:  Allergies  Allergen Reactions   Covid-19 Mrna Vacc (Moderna) Other (See Comments)    Optic neuropathy after original 2  vaccines    Statins Other (See Comments)    Myositis    Vital Signs:  BP 127/66   Pulse (!) 57   Ht 5\' 9"  (1.753 m)   Wt 188 lb (85.3 kg)   SpO2 97%   BMI 27.76 kg/m   Neurological Exam: MENTAL STATUS including orientation to time, place, person, recent and remote memory, attention span and concentration, language, and fund of knowledge is normal.  Speech is not dysarthric.  CRANIAL NERVES: Normal conjugate, extra-ocular eye movements in all directions of gaze.  No ptosis bilaterally.   MOTOR:  Motor  strength is 5/5 proximally, R hand is 66/5 (old from stroke). No atrophy, fasciculations or abnormal movements.  No pronator drift.   COORDINATION/GAIT:   Gait is normal.  Data: MRI cervical spine without contrast 02/17/2019: 1. 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 2. Solid anterior fusion at C4-5 and C5-6 without spinal stenosis at these levels. 3. Adjacent segment degenerative changes with moderate spinal stenosis at C6-7, mild-to-moderate spinal stenosis at C3-4, and mild-to-moderate neural foraminal stenosis at both levels.   MRI lumbar spine 02/17/2019: 1. Relatively diffuse lumbar disc and facet degeneration without significant spinal stenosis. 2. Multilevel neural foraminal stenosis, moderate on the left at L5-S1. 3. Normal appearance of the conus medullaris and cauda equina.   MRI thoracic spine without contrast 02/17/2019: 1. Mild thoracic disc and facet degeneration without evidence of neural impingement. 2. Minimal prominence of the central canal of the mid to lower thoracic spinal cord without syrinx.  Left rectus femorus muscle biopsy 02/20/2019:  Active necrotizing inflammatory myopathy. Right chest wall skin biopsy:  Benign skin with fibrosis and mixed superficial and deep inflammation  Neuromuscular Lab - [facility] 03/20/2019:  Myopathy 2 panel - HMGCR 36,000 (normal <2500)  Lab Results  Component Value Date   CKTOTAL 637 (HH) 03/14/2023   CKMB CANCELED 03/21/2020   CKMBINDEX 23.6 (HH) 04/15/2021   TROPONINI 0.03 (HH) 02/17/2019    Lab Results  Component Value Date   HGBA1C 6.7 (H) 11/15/2022     Thank you for allowing me to participate in patient's care.  If I can answer any additional questions, I would be pleased to do so.    Sincerely,    Houa Nie K. Allena Katz, DO

## 2023-03-22 ENCOUNTER — Other Ambulatory Visit (HOSPITAL_COMMUNITY): Payer: Self-pay

## 2023-03-22 ENCOUNTER — Other Ambulatory Visit: Payer: Self-pay

## 2023-04-04 ENCOUNTER — Other Ambulatory Visit: Payer: Self-pay

## 2023-04-04 ENCOUNTER — Other Ambulatory Visit (HOSPITAL_COMMUNITY): Payer: Self-pay

## 2023-04-07 ENCOUNTER — Other Ambulatory Visit: Payer: Self-pay

## 2023-04-07 ENCOUNTER — Other Ambulatory Visit (HOSPITAL_COMMUNITY): Payer: Self-pay

## 2023-04-20 ENCOUNTER — Other Ambulatory Visit: Payer: Self-pay

## 2023-05-03 ENCOUNTER — Other Ambulatory Visit: Payer: Self-pay

## 2023-05-11 ENCOUNTER — Encounter (INDEPENDENT_AMBULATORY_CARE_PROVIDER_SITE_OTHER): Payer: Self-pay

## 2023-05-18 ENCOUNTER — Telehealth: Payer: Medicare Other | Admitting: Nurse Practitioner

## 2023-05-18 DIAGNOSIS — U071 COVID-19: Secondary | ICD-10-CM

## 2023-05-18 NOTE — Progress Notes (Signed)
Since your COVID-19 symptoms started less than 5 days ago you may need a prescription for FDA-approved treatments (pills or IV therapy), which we cannot prescribe through an e-Visit. The best way for our providers to make a decision about which COVID treatment is right for you is through a Virtual Urgent Care Visit.  If you would like to discuss COVID therapy options with a provider, cancel this e-Visit and access a Virtual Urgent Care Visit from our [place] menu.   You will not be charged for the e-Visit.

## 2023-05-19 ENCOUNTER — Telehealth: Payer: Medicare Other | Admitting: Family Medicine

## 2023-05-19 ENCOUNTER — Encounter: Payer: Self-pay | Admitting: Family Medicine

## 2023-05-19 ENCOUNTER — Telehealth (INDEPENDENT_AMBULATORY_CARE_PROVIDER_SITE_OTHER): Payer: Medicare Other | Admitting: Family Medicine

## 2023-05-19 VITALS — Ht 69.0 in | Wt 183.0 lb

## 2023-05-19 DIAGNOSIS — I69959 Hemiplegia and hemiparesis following unspecified cerebrovascular disease affecting unspecified side: Secondary | ICD-10-CM

## 2023-05-19 DIAGNOSIS — U071 COVID-19: Secondary | ICD-10-CM | POA: Diagnosis not present

## 2023-05-19 MED ORDER — DOXYCYCLINE HYCLATE 100 MG PO TABS: 100.0000 mg | ORAL_TABLET | Freq: Two times a day (BID) | ORAL | 0 refills | Status: AC

## 2023-05-19 NOTE — Progress Notes (Signed)
Phone [phone] Virtual visit via Video note   Subjective:  Chief complaint: Chief Complaint  Patient presents with   Covid Positive    I tested positive yesterday. Symptoms started on Monday.  Have a low grade fever.  Very congested.  No energy.   Our team/I connected with [name] at  4:00 PM EDT by a video enabled telemedicine application (caregility through epic) and verified that I am speaking with the correct person using two identifiers. Our team/I discussed the limitations of evaluation and management by telemedicine and the availability of in person appointments.No physical exam was performed (except for noted visual exam or audio findings with Telehealth visits).   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    Priority: High   CAD (coronary artery disease), native coronary artery 07/13/2015    Priority: High   S/P PTCA (percutaneous transluminal coronary angioplasty) 07/08/2015    Priority: High   Elevated PSA 02/25/2017    Priority: Medium    Hemiparesis (HCC) 09/18/2014    Priority: Medium    BPH (benign prostatic hyperplasia) 09/18/2014    Priority: Medium    Hyperlipidemia     Priority: Medium    Bradycardia     Priority: Medium    Polyp of sigmoid colon     Priority: Low   B12 deficiency 02/26/2019    Priority: Low   History of colonic polyps 10/15/2014    Priority: Low   Transaminitis 09/18/2014    Priority: Low   Ptosis of eyelid, right 10/02/2020   Other optic atrophy, 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 180 tablet 3   doxycycline (VIBRA-TABS) 100 MG tablet Take 1 tablet (100 mg total) by mouth 2 (two) times daily for 7 days. 14 tablet 0   empagliflozin (JARDIANCE) 10 MG TABS tablet Take 1 tablet (10 mg total) by mouth daily before breakfast. 90 tablet 3   Evolocumab (REPATHA SURECLICK) 140 MG/ML SOAJ Inject 140 mg into the skin every 14 (fourteen) days. 6 mL 3   finasteride (PROSCAR) 5 MG tablet Take 1 tablet (5 mg total) by mouth daily. 90 tablet 3   metFORMIN (GLUCOPHAGE) 1000 MG tablet Take 1 tablet (1,000 mg total) by mouth 2 (two) times daily with a meal. 180 tablet 3   mycophenolate (CELLCEPT) 500 MG tablet Take 2 tablets (1,000 mg total) by mouth 2 (two) times daily. 360 tablet 3   nitroGLYCERIN (NITROSTAT) 0.4 MG SL tablet Place 1 tablet (0.4 mg total) under the tongue every 5 (five) minutes as needed for chest pain. 25 tablet 4   pantoprazole (PROTONIX) 20 MG tablet Take 1 tablet (20 mg total) by mouth daily. 90 tablet 3   predniSONE (DELTASONE) 5 MG tablet Take 1 tablet (5 mg total) by mouth daily with breakfast. (Patient taking differently: Take 2.5 mg by mouth daily with breakfast.) 90 tablet 2   tamsulosin (  FLOMAX) 0.4 MG CAPS capsule Take 1 capsule (0.4 mg total) by mouth daily. 90 capsule 3   VASCEPA 1 g capsule Take 2 capsules (2 g total) by mouth 2 (two) times daily. 360 capsule 3   Carboxymethylcellulose Sodium (REFRESH TEARS OP) Place 1 drop into both eyes daily as needed (dry eyes).  (Patient not taking: Reported on 05/19/2023)     sildenafil (VIAGRA) 100 MG tablet Take 1 tablet (100 mg total) by mouth daily as needed for erectile dysfunction. (Patient not taking: Reported on 05/19/2023) 10 tablet 11   tiZANidine (ZANAFLEX) 4 MG tablet Take 1 tablet (4 mg total) by mouth every 6 (six)  hours as needed for muscle spasm (Patient not taking: Reported on 05/19/2023) 30 tablet 0   No current facility-administered medications for this visit.     Objective:  Ht 5\' 9"  (1.753 m)   Wt 183 lb (83 kg)   SpO2 98%   BMI 27.02 kg/m  self reported vitals Gen: NAD, resting comfortably with wife Lungs: nonlabored, normal respiratory rate  Skin: appears dry, no obvious rash     Assessment and Plan   #social update- went on cruise recently but likely contracted COVID  # Covid 19  S:tested positive yesterday. Symptoms started Monday. Has a low grade temperature of 99 or 100, none today. Very congested. No energy. No shortness of breath. Oxygen was 98% today. Not getting much out of nasal passages.  -Vaccination status: no recent vaccinations due to prior severe reactions  A/P: Patient with testing confirming covid 19 with first day of covid 19 symptoms : 05/16/23  Therefore: - recommended patient watch closely for shortness of breath or confusion or worsening symptoms and if those occur patient should contact us immediately or seek care in the emergency department -recommended patient consider purchasing pulse oximeter and if levels 94% or below persistently- seek care at the hospital (very unlikely for this to happen without significant underlying respiratory disease)- he is already monitoring oxygen levels - Isolation guidelines per CDC as of march 2024- "The recommendations suggest returning to normal activities when, for at least 24 hours, symptoms are improving overall, and if a fever was present, it has been gone without use of a fever-reducing medication. Once people resume normal activities, they are encouraged to take additional prevention strategies for the next 5 days to curb disease spread, such as taking more steps for cleaner air, enhancing hygiene practices, wearing a well-fitting mask, keeping a distance from others, and/or getting tested for respiratory viruses." -we  discussed as immunodepressed he may shed virus longer and to be cautious with exposures   If High risk for complications-we discussed outpatient therapeutic options including Paxlovid but contraindicated with eliquis use. Molnupiravir with limited efficacy.  - does have a lot of nasal congestion that seems to be worsening- going to try Flonase and as back up in case he has secondary bacterial infection I sent in doxycycline for him if faisl to start to improve in next few days.   Recommended follow up:  Future Appointments  Date Time Provider Department Center  06/10/2023 10:00 AM Shelva Majestic, MD [facility]  06/21/2023  1:30 PM Nita Sickle K, DO [facility] None  07/04/2023  1:00 PM [facility] ANNUAL WELLNESS VISIT 1 [facility]  02/29/2024 11:30 AM Yates Decamp, MD [facility] None    Lab/Order associations:   ICD-10-CM   1. COVID-19  U07.1     2. 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 May make deconditioning with COVID 19 more likely      Meds ordered this encounter  Medications   doxycycline (VIBRA-TABS) 100 MG tablet    Sig: Take 1 tablet (100 mg total) by mouth 2 (two) times daily for 7 days.    Dispense:  14 tablet    Refill:  0   Return precautions advised.  Tana Conch, MD
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
## 2023-05-25 ENCOUNTER — Other Ambulatory Visit: Payer: Self-pay

## 2023-05-26 ENCOUNTER — Encounter (INDEPENDENT_AMBULATORY_CARE_PROVIDER_SITE_OTHER): Payer: Self-pay

## 2023-06-01 ENCOUNTER — Other Ambulatory Visit: Payer: Self-pay | Admitting: Family Medicine

## 2023-06-01 ENCOUNTER — Other Ambulatory Visit: Payer: Self-pay | Admitting: Neurology

## 2023-06-01 ENCOUNTER — Other Ambulatory Visit: Payer: Self-pay

## 2023-06-02 ENCOUNTER — Encounter: Payer: Self-pay | Admitting: Family Medicine

## 2023-06-02 LAB — CBC WITH DIFFERENTIAL/PLATELET
Basophils Absolute: 0.1 10*3/uL (ref 0.0–0.2)
Basos: 1 %
EOS (ABSOLUTE): 0.1 10*3/uL (ref 0.0–0.4)
Eos: 2 %
Hematocrit: 43.6 % (ref 37.5–51.0)
Hemoglobin: 14.2 g/dL (ref 13.0–17.7)
Immature Grans (Abs): 0 10*3/uL (ref 0.0–0.1)
Immature Granulocytes: 0 %
Lymphocytes Absolute: 1.7 10*3/uL (ref 0.7–3.1)
Lymphs: 32 %
MCH: 28.7 pg (ref 26.6–33.0)
MCHC: 32.6 g/dL (ref 31.5–35.7)
MCV: 88 fL (ref 79–97)
Monocytes Absolute: 0.5 10*3/uL (ref 0.1–0.9)
Monocytes: 8 %
Neutrophils Absolute: 3.2 10*3/uL (ref 1.4–7.0)
Neutrophils: 57 %
Platelets: 233 10*3/uL (ref 150–450)
RBC: 4.95 x10E6/uL (ref 4.14–5.80)
RDW: 13.7 % (ref 11.6–15.4)
WBC: 5.5 10*3/uL (ref 3.4–10.8)

## 2023-06-02 LAB — CK: Total CK: 704 U/L (ref 41–331)

## 2023-06-02 LAB — COMPREHENSIVE METABOLIC PANEL
ALT: 36 IU/L (ref 0–44)
AST: 35 IU/L (ref 0–40)
Albumin: 4.5 g/dL (ref 3.9–4.9)
Alkaline Phosphatase: 71 IU/L (ref 44–121)
BUN/Creatinine Ratio: 20 (ref 10–24)
BUN: 18 mg/dL (ref 8–27)
Bilirubin Total: 0.3 mg/dL (ref 0.0–1.2)
CO2: 22 mmol/L (ref 20–29)
Calcium: 9.9 mg/dL (ref 8.6–10.2)
Chloride: 102 mmol/L (ref 96–106)
Creatinine, Ser: 0.91 mg/dL (ref 0.76–1.27)
Globulin, Total: 2.5 g/dL (ref 1.5–4.5)
Glucose: 118 mg/dL — ABNORMAL HIGH (ref 70–99)
Potassium: 4.5 mmol/L (ref 3.5–5.2)
Sodium: 142 mmol/L (ref 134–144)
Total Protein: 7 g/dL (ref 6.0–8.5)
eGFR: 91 mL/min/{1.73_m2} (ref >59)

## 2023-06-02 LAB — LAB REPORT - SCANNED
A1c: 6.3
EGFR: 91

## 2023-06-02 LAB — LIPID PANEL W/O CHOL/HDL RATIO
Cholesterol, Total: 134 mg/dL (ref 100–199)
HDL: 40 mg/dL (ref >39)
LDL Chol Calc (NIH): 52 mg/dL (ref 0–99)
Triglycerides: 269 mg/dL — ABNORMAL HIGH (ref 0–149)
VLDL Cholesterol Cal: 42 mg/dL — ABNORMAL HIGH (ref 5–40)

## 2023-06-02 LAB — HGB A1C W/O EAG: Hgb A1c MFr Bld: 6.3 % — ABNORMAL HIGH (ref 4.8–5.6)

## 2023-06-02 LAB — SPECIMEN STATUS REPORT

## 2023-06-02 LAB — TSH: TSH: 4.15 u[IU]/mL (ref 0.450–4.500)

## 2023-06-07 ENCOUNTER — Encounter: Payer: Self-pay | Admitting: Neurology

## 2023-06-08 ENCOUNTER — Other Ambulatory Visit (HOSPITAL_COMMUNITY): Payer: Self-pay

## 2023-06-09 ENCOUNTER — Other Ambulatory Visit: Payer: Self-pay

## 2023-06-09 DIAGNOSIS — Z5181 Encounter for therapeutic drug level monitoring: Secondary | ICD-10-CM

## 2023-06-09 DIAGNOSIS — G7249 Other inflammatory and immune myopathies, not elsewhere classified: Secondary | ICD-10-CM

## 2023-06-10 ENCOUNTER — Encounter: Payer: Self-pay | Admitting: Family Medicine

## 2023-06-10 ENCOUNTER — Ambulatory Visit: Payer: Medicare Other | Admitting: Family Medicine

## 2023-06-10 VITALS — BP 110/64 | HR 50 | Temp 98.2°F | Ht 69.0 in | Wt 179.2 lb

## 2023-06-10 DIAGNOSIS — E538 Deficiency of other specified B group vitamins: Secondary | ICD-10-CM

## 2023-06-10 DIAGNOSIS — R739 Hyperglycemia, unspecified: Secondary | ICD-10-CM | POA: Diagnosis not present

## 2023-06-10 DIAGNOSIS — G8191 Hemiplegia, unspecified affecting right dominant side: Secondary | ICD-10-CM

## 2023-06-10 DIAGNOSIS — Z0000 Encounter for general adult medical examination without abnormal findings: Secondary | ICD-10-CM

## 2023-06-10 DIAGNOSIS — T380X5A Adverse effect of glucocorticoids and synthetic analogues, initial encounter: Secondary | ICD-10-CM

## 2023-06-10 DIAGNOSIS — G7249 Other inflammatory and immune myopathies, not elsewhere classified: Secondary | ICD-10-CM

## 2023-06-10 NOTE — Addendum Note (Signed)
Addended by: Lorn Junes on: 06/10/2023 10:40 AM   Modules accepted: Orders
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
## 2023-06-10 NOTE — Patient Instructions (Addendum)
   You are eligible to schedule your annual wellness visit with our nurse specialist Inetta Fermo.  Please consider scheduling this before you leave today  Recommended follow up: Return in about 6 months (around 12/09/2023) for followup or sooner if needed.Schedule b4 you leave.b

## 2023-06-21 ENCOUNTER — Ambulatory Visit: Payer: Medicare Other | Admitting: Neurology

## 2023-07-01 ENCOUNTER — Other Ambulatory Visit: Payer: Self-pay

## 2023-07-07 ENCOUNTER — Other Ambulatory Visit (HOSPITAL_COMMUNITY): Payer: Self-pay

## 2023-07-07 ENCOUNTER — Other Ambulatory Visit: Payer: Self-pay

## 2023-07-10 ENCOUNTER — Encounter: Payer: Self-pay | Admitting: Family Medicine

## 2023-07-11 NOTE — Telephone Encounter (Signed)
FYI

## 2023-07-13 ENCOUNTER — Ambulatory Visit: Payer: Medicare Other | Admitting: Gastroenterology

## 2023-07-13 ENCOUNTER — Encounter: Payer: Self-pay | Admitting: Gastroenterology

## 2023-07-13 VITALS — BP 106/61 | HR 53 | Temp 97.6°F | Ht 69.0 in | Wt 182.6 lb

## 2023-07-13 DIAGNOSIS — R197 Diarrhea, unspecified: Secondary | ICD-10-CM

## 2023-07-13 NOTE — Progress Notes (Signed)
GI Office Note    Referring Provider: Shelva Majestic, MD Primary Care Physician:  Shelva Majestic, MD  Primary Gastroenterologist: Hennie Duos. Marletta Lor, DO   Chief Complaint   Chief Complaint  Patient presents with   Diarrhea    Was having issues with diarrhea, stopped metformin 2 days ago. Last bm was Friday.      History of Present Illness   Stanley Boyd is a 72 y.o. male presenting today for further evaluation of diarrhea. Patient last seen at time of colonoscopy in 2021.   History of autoimmune necrotizing myopathy diagnosed in 2020. Chronically elevated AST/ALT reportedly back to 2018 but dramatic increase in 02/2019 with onset of myopathy. Per patient, symptoms triggered by statin at that time. He was seen by GI, Dr. Midge Minium, completed work up of elevated LFTs including liver biopsy as outlined below. LFTs have improved with treatment of his autoimmune necrotizing myopathy and over the past 3 years he has had intermittent mild elevation of AST/ALT.   Patient is followed closely with neurology. Chronically on Cellcept and prednisone but started prednisone taper in 03/2023 and has been off prednisone since mid-July. States he has lost 25 pounds since coming off prednisone.  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 He has some associated autonomic dysfunction per wife.   Today: states he has been having some issues with intermittent diarrhea. He gets the urge and cannot hold off. If he does not have quick access to a bathroom then he has fecal incontinence. He says for 25 years since his accident, his bowel pattern typically consisted of having a BM every 3-4 days. Sometimes would have to use miralax. Now his stools are either mushy or liquid. No melena, brbpr. Odor is terrible. He has to go back and clean himself again couple of hours after a BM due to fecal soilage. Last  week was a bad week with diarrhea. He was on vacation and it happened 3-4 days in a row. After episodes he feels fatigued the rest of the day. He denies any recent additions in his medications. He had been on metformin since 2020 due to chronic prednisone. Denies any recent increase in dose. He is now on farxiga. A1C improving. No heartburn on low dose pantoprazole. No N/V. No abdominal pain except with fecal urgency.   He reached out to his son Dr. Everlene Other about his symptoms. He and his PCP advised that he should stop metformin to see if diarrhea improved. Patient states his last BM was Friday. Stopped metformin on Sunday. Feels like he may have BM today.     Labs/2024: A1c 6.3, estimated GFR 91, total CK 704, TSH 4.150, A1c 6.3, triglycerides 269, total cholesterol 134, LDL 52, creatinine 0.91, sodium 142, albumin 4.5, total bilirubin 0.3, alkaline phosphatase 71, AST 35, ALT 36.  In February 2024 his AST was 52, ALT 68.  White blood cell count 5500, hemoglobin 14.2, platelets 233,000  Colonoscopy April 2021: -one 3 mm polyp in the sigmoid colon removed, hyperplastic -External and internal hemorrhoids -Colonoscopy in 10 years  History of tubular adenoma in 2015.  Prior liver biopsy June 2020 when he presented with elevated AST/ALT. Noted to have  mild steatosis (less than 5%), no increase in fibrosis.  At that time hepatitis B surface antibody negative, mitochondrial antibody negative, hepatitis A total antibody negative, GGT 106.  Acute hepatitis panel negative. ANA negative. His AST/ALT were as high as 400/500s. Has continued to have mild elevation of AST/ALT intermittently since 2021. Ultimately his elevated LFTs felt to be due to acute autoimmune necrotizing myopathy at that time.    CT chest, abdomen, pelvis with contrast May 2020 IMPRESSION: 1.  No CT evidence of malignancy in the chest, abdomen, or pelvis. 2. Innumerable tiny centrilobular pulmonary nodules in an apically predominant  distribution, which may reflect sequelae of atypical infection or smoking-related respiratory bronchiolitis. There is an unchanged, benign somewhat bandlike consolidation in the right lung base. 3.  Coronary artery disease. 4.  Atherosclerosis. 5.  Prostatomegaly.   Medications   Current Outpatient Medications  Medication Sig Dispense Refill   apixaban (ELIQUIS) 5 MG TABS tablet Take 1 tablet (5 mg total) by mouth 2 (two) times daily. 180 tablet 3   Carboxymethylcellulose Sodium (REFRESH TEARS OP) Place 1 drop into both eyes daily as needed (dry eyes).     empagliflozin (JARDIANCE) 10 MG TABS tablet Take 1 tablet (10 mg total) by mouth daily before breakfast. 90 tablet 3   Evolocumab (REPATHA SURECLICK) 140 MG/ML SOAJ Inject 140 mg into the skin every 14 (fourteen) days. 6 mL 3   finasteride (PROSCAR) 5 MG tablet Take 1 tablet (5 mg total) by mouth daily. 90 tablet 3   mycophenolate (CELLCEPT) 500 MG tablet Take 2 tablets (1,000 mg total) by mouth 2 (two) times daily. 360 tablet 3   nitroGLYCERIN (NITROSTAT) 0.4 MG SL tablet Place 1 tablet (0.4 mg total) under the tongue every 5 (five) minutes as needed for chest pain. 25 tablet 4   pantoprazole (PROTONIX) 20 MG tablet Take 1 tablet (20 mg total) by mouth daily. 90 tablet 3   sildenafil (VIAGRA) 100 MG tablet Take 1 tablet (100 mg total) by mouth daily as needed for erectile dysfunction. 10 tablet 11   tamsulosin (FLOMAX) 0.4 MG CAPS capsule Take 1 capsule (0.4 mg total) by mouth daily. 90 capsule 3   VASCEPA 1 g capsule Take 2 capsules (2 g total) by mouth 2 (two) times daily. 360 capsule 3   No current facility-administered medications for this visit.    Allergies   Allergies as of 07/13/2023 - Review Complete 07/13/2023  Allergen Reaction Noted   Covid-19 mrna vacc (moderna) Other (See Comments) 01/08/2021   Statins Other (See Comments) 04/11/2019    Past Medical History   Past Medical History:  Diagnosis Date   BPH (benign  prostatic hyperplasia)    Bradycardia    40s to low 50s   Chronic back pain    multiple surgeries following fall from a deer stand   Coronary artery disease    5 stents   Headache    "probably monthly" (07/08/2015)   Hyperlipidemia    on injectable medication   Kidney stones    "years ago; never had OR/scopes"   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   Neuromuscular disorder (HCC)    Pneumonia 08/2014    Past Surgical History   Past Surgical History:  Procedure Laterality Date   ANTERIOR CERVICAL DECOMP/DISCECTOMY FUSION  88; 68; 2000   [facility]   BACK SURGERY     BIOPSY PROSTATE  2017   CARDIAC CATHETERIZATION N/A 07/08/2015   Procedure: Left Heart Cath and Coronary Angiography;  Surgeon: Yates Decamp, MD;  Location: [facility] INVASIVE CV LAB;  Service: Cardiovascular;  Laterality: N/A;   CARDIAC CATHETERIZATION N/A 07/17/2015   Procedure: Coronary Stent Intervention;  Surgeon: Vonna Kotyk  Jacinto Halim, MD;  Location: [facility] INVASIVE CV LAB;  Service: Cardiovascular;  Laterality: N/A;   CARDIAC CATHETERIZATION N/A 04/16/2016   Procedure: Left Heart Cath and Coronary Angiography;  Surgeon: Yates Decamp, MD;  Location: [facility] INVASIVE CV LAB;  Service: Cardiovascular;  Laterality: N/A;   CARDIAC CATHETERIZATION N/A 04/16/2016   Procedure: Coronary Stent Intervention;  Surgeon: Yates Decamp, MD;  Location: [facility] INVASIVE CV LAB;  Service: Cardiovascular;  Laterality: N/A;   CATARACT EXTRACTION  11/2021   COLONOSCOPY N/A 04/05/2014   Procedure: COLONOSCOPY;  Surgeon: West Bali, MD;  Location: [facility] ENDO SUITE;  Service: Endoscopy;  Laterality: N/A;  10:30 AM   COLONOSCOPY N/A 01/25/2020   Procedure: COLONOSCOPY;  Surgeon: West Bali, MD;  Location: [facility] ENDO SUITE;  Service: Endoscopy;  Laterality: N/A;  9:30   CORONARY ANGIOPLASTY WITH STENT PLACEMENT  07/08/2015   "2 stents"   CORONARY STENT PLACEMENT  04/16/2016   PTCA and stenting of the ostial LAD with implantation of a 3.0 x 12 mm resolute integrity DES,  stenosis reduced from 80% to 0% with maintenance of TIMI-3 flow. Ostial OM1 in-stent restenosis reduced to less than 20% with a 2.0 x 12 mm emerge balloon at 14 atmospheric pressure   LEFT HEART CATH AND CORONARY ANGIOGRAPHY N/A 05/13/2020   Procedure: LEFT HEART CATH AND CORONARY ANGIOGRAPHY;  Surgeon: Yates Decamp, MD;  Location: [facility] INVASIVE CV LAB;  Service: Cardiovascular;  Laterality: N/A;   LEFT HEART CATH AND CORONARY ANGIOGRAPHY N/A 02/09/2022   Procedure: LEFT HEART CATH AND CORONARY ANGIOGRAPHY;  Surgeon: Yates Decamp, MD;  Location: [facility] INVASIVE CV LAB;  Service: Cardiovascular;  Laterality: N/A;   LIVER BIOPSY  03/2019   LUMBAR DISC SURGERY  1984   MUSCLE BIOPSY N/A 02/20/2019   Procedure: LEFT QUADRICEP MUSCLE  BIOPSY AND PUNCH BIOPSY OF RIGHT CHEST;  Surgeon: Andria Meuse, MD;  Location: [facility] OR;  Service: General;  Laterality: N/A;   POLYPECTOMY  01/25/2020   Procedure: POLYPECTOMY;  Surgeon: West Bali, MD;  Location: [facility] ENDO SUITE;  Service: Endoscopy;;  sigmoid   PTCA  07/17/2015   OMI    DES   SPINE SURGERY  1984, 1994, 1995, 2000    Past Family History   Family History  Problem Relation Age of Onset   Alzheimer's disease Mother    Cancer Father    Breast cancer Sister    Cancer Sister    Lung cancer Brother    Cancer Brother    Colon cancer Other        grandmother    Past Social History   Social History   Socioeconomic History   Marital status: Married    Spouse name: Syris Brookens   Number of children: 2   Years of education: Not on file   Highest education level: Not on file  Occupational History   Occupation: retired  Tobacco Use   Smoking status: Never   Smokeless tobacco: Never  Vaping Use   Vaping status: Never Used  Substance and Sexual Activity   Alcohol use: Not Currently   Drug use: Never   Sexual activity: Yes    Partners: Female  Other Topics Concern   Not on file  Social History Narrative   Married. 2 children Dorie Rank [place],  DO of [employer] [place])      Retired/disability after accident in 2000.       2 story home   Right handed   Social Determinants of Health   Financial  Resource Strain: Low Risk  (06/28/2022)   Overall Financial Resource Strain (CARDIA)    Difficulty of Paying Living Expenses: Not hard at all  Food Insecurity: No Food Insecurity (06/28/2022)   Hunger Vital Sign    Worried About Running Out of Food in the Last Year: Never true    Ran Out of Food in the Last Year: Never true  Transportation Needs: No Transportation Needs (06/28/2022)   PRAPARE - Administrator, Civil Service (Medical): No    Lack of Transportation (Non-Medical): No  Physical Activity: Sufficiently Active (06/28/2022)   Exercise Vital Sign    Days of Exercise per Week: 4 days    Minutes of Exercise per Session: 60 min  Stress: No Stress Concern Present (06/28/2022)   [employer] of Occupational Health - Occupational Stress Questionnaire    Feeling of Stress : Not at all  Social Connections: Socially Integrated (06/28/2022)   Social Connection and Isolation Panel [NHANES]    Frequency of Communication with Friends and Family: Three times a week    Frequency of Social Gatherings with Friends and Family: More than three times a week    Attends Religious Services: More than 4 times per year    Active Member of [employer] or Organizations: Yes    Attends Banker Meetings: 1 to 4 times per year    Marital Status: Married  Catering manager Violence: Not At Risk (06/28/2022)   Humiliation, Afraid, Rape, and Kick questionnaire    Fear of Current or Ex-Partner: No    Emotionally Abused: No    Physically Abused: No    Sexually Abused: No    Review of Systems   General: Negative for anorexia, weight loss, fever, chills. See hpi. Eyes: vision issues after covid vaccination which damaged optic nerves. ENT: Negative for hoarseness, difficulty swallowing , nasal congestion. CV: Negative for chest pain,  angina, palpitations, dyspnea on exertion, peripheral edema.  Respiratory: Negative for dyspnea at rest, dyspnea on exertion, cough, sputum, wheezing.  GI: See history of present illness. GU:  Negative for dysuria, hematuria, urinary incontinence, urinary frequency, nocturnal urination.  MS: Negative for joint pain, low back pain.  Derm: Negative for rash or itching.  Neuro: Negative for seizure, frequent headaches, memory loss, confusion. See hpi. Psych: Negative for anxiety, depression, suicidal ideation, hallucinations.  Endo: Negative for unusual weight change.  Heme: Negative for bruising or bleeding. Allergy: Negative for rash or hives.  Physical Exam   BP 106/61 (BP Location: Right Arm, Patient Position: Sitting, Cuff Size: Normal)   Pulse (!) 53   Temp 97.6 F (36.4 C) (Oral)   Ht 5\' 9"  (1.753 m)   Wt 182 lb 9.6 oz (82.8 kg)   SpO2 98%   BMI 26.97 kg/m    General: Well-nourished, well-developed in no acute distress. Accompanied by wife.  Head: Normocephalic, atraumatic.   Eyes: Conjunctiva pink, no icterus. Mouth: Oropharyngeal mucosa moist and pink , no lesions erythema or exudate. Neck: Supple without thyromegaly, masses, or lymphadenopathy.  Lungs: Clear to auscultation bilaterally.  Heart: Regular rate and rhythm, no murmurs rubs or gallops.  Abdomen: Bowel sounds are normal, nontender, nondistended, no hepatosplenomegaly or masses,  no abdominal bruits or hernia, no rebound or guarding.   Rectal: not performed Extremities: No lower extremity edema. No clubbing or deformities.  Neuro: Alert and oriented x 4 , grossly normal neurologically.  Skin: Warm and dry, no rash or jaundice.   Psych: Alert and cooperative, normal mood and  affect.  Labs   See hpi  Imaging Studies   No results found.  Assessment   *Diarrhea   Change in bowels over the past several months. Historically has BM every few days since his accident 25 years ago. However more recently, he  has been having more loose stools with episodes of urgency/fecal incontinence, with increased frequency last week. He denies recent antibiotics although he is chronically immunosuppressed. He recently tapered off chronic steroids and has been off since mid-July. He is unsure if diarrhea started before or after that. Cannot exclude infectious etiology, microscopic colitis previously masked by steroids but apparent now off prednisone, medication side effect. His colonoscopy was completed in 2021. Not as concerned about malignancy.  He has noted improvement since holding metformin recently, last BM Friday.  At this time we will monitor symptoms although I think it would be reasonable to screen for celiac disease and if diarrhea returns, collect stool for GI profile.     PLAN   Continue to hold metformin for now, monitor bowel function over the next couple of weeks.  Screen for celiac.  Stool for GI profile if recurrent loose stool.  Patient to reach out with progress report in a couple of weeks.    Leanna Battles. Melvyn Neth, MHS, PA-C [facility]

## 2023-07-13 NOTE — Patient Instructions (Signed)
So nice to meet you today!  Please continue to hold metformin for now. Monitor your bowel function over the next couple of weeks. If you have recurrence of loose stool, then please collect stool specimen to rule out infection. You will need to pick up stool container at [facility].  Please complete labs to screen for celiac disease. You can do this at anytime.  Please let me know how you are doing in the next couple of weeks.

## 2023-07-17 LAB — IGA: IgA/Immunoglobulin A, Serum: 213 mg/dL (ref 61–437)

## 2023-07-17 LAB — TISSUE TRANSGLUTAMINASE, IGA: Transglutaminase IgA: <2 U/mL (ref 0–3)

## 2023-07-19 ENCOUNTER — Other Ambulatory Visit: Payer: Self-pay

## 2023-07-28 ENCOUNTER — Other Ambulatory Visit (HOSPITAL_COMMUNITY): Payer: Self-pay

## 2023-08-05 NOTE — Progress Notes (Signed)
Discussed previous recommendations for 10 year follow up colonoscopy with Dr. Marletta Lor. He agrees that based on current guidelines, next colonoscopy would be 01/2030. If recurrent diarrhea or any other new symptoms, a diagnostic colonoscopy could be completed at any time.

## 2023-08-23 ENCOUNTER — Other Ambulatory Visit (HOSPITAL_COMMUNITY): Payer: Self-pay

## 2023-08-25 ENCOUNTER — Other Ambulatory Visit: Payer: Self-pay

## 2023-08-29 ENCOUNTER — Ambulatory Visit (INDEPENDENT_AMBULATORY_CARE_PROVIDER_SITE_OTHER): Payer: Medicare Other

## 2023-08-29 ENCOUNTER — Other Ambulatory Visit: Payer: Self-pay

## 2023-08-29 VITALS — Wt 182.0 lb

## 2023-08-29 DIAGNOSIS — Z0000 Encounter for general adult medical examination without abnormal findings: Secondary | ICD-10-CM | POA: Diagnosis not present
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
## 2023-08-29 NOTE — Patient Instructions (Signed)
Stanley Boyd , Thank you for taking time to come for your Medicare Wellness Visit. I appreciate your ongoing commitment to your health goals. Please review the following plan we discussed and let me know if I can assist you in the future.   Referrals/Orders/Follow-Ups/Clinician Recommendations: Aim for 30 minutes of exercise or brisk walking, 6-8 glasses of water, and 5 servings of fruits and vegetables each day. Lose 8 more   This is a list of the screening recommended for you and due dates:  Health Maintenance  Topic Date Due   Zoster (Shingles) Vaccine (1 of 2) 04/18/1972   Yearly kidney health urinalysis for diabetes  01/26/2023   Medicare Annual Wellness Visit  06/29/2023   Pneumonia Vaccine (3 of 3 - PPSV23 or PCV20) 11/20/2023*   Flu Shot  01/09/2024*   Yearly kidney function blood test for diabetes  06/01/2024   Colon Cancer Screening  01/24/2025   Hepatitis C Screening  Completed   HPV Vaccine  Aged Out   DTaP/Tdap/Td vaccine  Discontinued   COVID-19 Vaccine  Discontinued  *Topic was postponed. The date shown is not the original due date.    Advanced directives: (Copy Requested) Please bring a copy of your health care power of attorney and living will to the office to be added to your chart at your convenience.  Next Medicare Annual Wellness Visit scheduled for next year: Yes

## 2023-09-06 ENCOUNTER — Other Ambulatory Visit: Payer: Self-pay

## 2023-09-16 ENCOUNTER — Other Ambulatory Visit: Payer: Self-pay

## 2023-09-23 ENCOUNTER — Other Ambulatory Visit (HOSPITAL_COMMUNITY): Payer: Self-pay

## 2023-09-24 ENCOUNTER — Other Ambulatory Visit (HOSPITAL_COMMUNITY): Payer: Self-pay

## 2023-10-01 LAB — CK: Total CK: 915 U/L (ref 41–331)

## 2023-10-03 ENCOUNTER — Other Ambulatory Visit: Payer: Self-pay

## 2023-10-11 ENCOUNTER — Other Ambulatory Visit (HOSPITAL_COMMUNITY): Payer: Self-pay

## 2023-10-11 ENCOUNTER — Encounter: Payer: Self-pay | Admitting: Neurology

## 2023-10-11 ENCOUNTER — Ambulatory Visit: Payer: Medicare Other | Admitting: Neurology

## 2023-10-11 ENCOUNTER — Other Ambulatory Visit: Payer: Self-pay

## 2023-10-11 VITALS — BP 103/64 | HR 64 | Ht 69.0 in | Wt 182.0 lb

## 2023-10-11 DIAGNOSIS — G7249 Other inflammatory and immune myopathies, not elsewhere classified: Secondary | ICD-10-CM | POA: Diagnosis not present

## 2023-10-11 DIAGNOSIS — Z5181 Encounter for therapeutic drug level monitoring: Secondary | ICD-10-CM

## 2023-10-11 MED ORDER — MYCOPHENOLATE MOFETIL 500 MG PO TABS
ORAL_TABLET | ORAL | 3 refills | Status: DC
  Filled 2023-10-11: qty 450, fill #0
  Filled 2023-12-18: qty 450, 90d supply, fill #0
  Filled 2024-03-20: qty 450, 90d supply, fill #1
  Filled 2024-06-12: qty 450, 90d supply, fill #2
  Filled 2024-09-07: qty 450, 90d supply, fill #3

## 2023-10-11 NOTE — Progress Notes (Signed)
 Follow-up Visit   Date: 10/11/23   Anthem Frazer MRN: [id number] DOB: 01/12/1951    Stanley Boyd is a 72 y.o. 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  The patient was accompanied to the clinic by self.   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  He has been doing great on Cellcept  1000mg  BID and slow prednisone  taper.  Prednisone  was completely tapered off ~ August 2024 and he denies any weakness. There has been a slight increase in CK from 700 >900.  Fortunately, there is no weakness on his exam.  Previously managed on a long course of IVIG (discontinued in 01/2021), prednisone  (stopped in August 2024), and Cellcept  (increased to 1000mg  BID in Feb 2022) Due to slight bump in CK, will increase Cellcept  to 2500mg /d (1000mg  in the morning and 1500mg  at bedtime) Continue to monitor off prednisone  Check CK, CBC, CMP in 3 months.  He would like this drawn at [facility] office, as he already is scheduled for labs on 3/7.  2.  Bilateral optic neuropathy, stable.  Previously evaluated by [facility]  Return to clinic in 3 months  ------------------------------------------------------------------------ Interim History: He is here for follow-up visit.  He has been doing great and tells me that he has felt the best in a long time over the past 4 months.  He has been able to reduce prednisone  to 2.5mg , which he has been on for the past month.  No new weakness.  CK continues to fluctuate ~ 600-700.   UPDATE 10/11/2023:  He is here for follow-up visit.  He has been off prednisone  since ~ August after a very long taper.  His most recent CK was elevated from 700 > 900.  He denies any weakness.  If anything, he has noticed that it  is harder for him to get up off the floor, but feels this may more age-related than true weakness.  He is physically very active and has not noticed any new changes with his endurance or activity level.  No interval falls or illnesses.    Medications:  Current Outpatient Medications on File Prior to Visit  Medication Sig Dispense Refill   apixaban  (ELIQUIS ) 5 MG TABS tablet Take 1 tablet (5 mg total) by mouth 2 (two) times daily. 180 tablet 3   Carboxymethylcellulose Sodium (REFRESH TEARS OP) Place 1 drop into both eyes daily as needed (dry eyes).     empagliflozin  (JARDIANCE ) 10 MG TABS tablet Take 1 tablet (10 mg total) by mouth daily before breakfast. 90 tablet 3   Evolocumab  (REPATHA  SURECLICK) 140 MG/ML SOAJ Inject 140 mg into the skin every 14 (fourteen) days. 6 mL 3   finasteride  (PROSCAR ) 5 MG tablet Take 1 tablet (5 mg total) by mouth daily. 90 tablet 3   mycophenolate  (CELLCEPT ) 500 MG tablet Take 2 tablets (1,000 mg total) by mouth 2 (two) times daily. 360 tablet 3   nitroGLYCERIN  (NITROSTAT ) 0.4 MG SL tablet Place 1 tablet (0.4 mg total) under the tongue every 5 (five) minutes as needed for chest pain. 25 tablet 4   pantoprazole  (PROTONIX ) 20 MG tablet Take 1 tablet (20 mg total) by mouth daily. 90 tablet 3   sildenafil  (VIAGRA ) 100 MG tablet Take 1 tablet (100 mg total) by mouth daily as needed for erectile dysfunction. 10 tablet 11  tamsulosin  (FLOMAX ) 0.4 MG CAPS capsule Take 1 capsule (0.4 mg total) by mouth daily. 90 capsule 3   VASCEPA  1 g capsule Take 2 capsules (2 g total) by mouth 2 (two) times daily. 360 capsule 3   No current facility-administered medications on file prior to visit.    Allergies:  Allergies  Allergen Reactions   Covid-19 Mrna Vacc (Moderna) Other (See Comments)    Optic neuropathy after original 2 vaccines    Statins Other (See Comments)    Myositis    Vital Signs:  BP 103/64   Pulse 64   Ht 5' 9 (1.753 m)   Wt 182 lb (82.6 kg)   SpO2 98%    BMI 26.88 kg/m   Neurological Exam: MENTAL STATUS including orientation to time, place, person, recent and remote memory, attention span and concentration, language, and fund of knowledge is normal.  Speech is not dysarthric.  CRANIAL NERVES: Normal conjugate, extra-ocular eye movements in all directions of gaze.  No ptosis bilaterally.  Face is symmetric.   MOTOR:  Motor strength is 5/5 throughout, except R hand is 19/5 (old from stroke). No atrophy, fasciculations or abnormal movements.  No pronator drift.   COORDINATION/GAIT:   He is able to stand up from chair without pushing off easily. Gait is normal.  Data: MRI cervical spine without contrast 02/17/2019: 1. 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 2. Solid anterior fusion at C4-5 and C5-6 without spinal stenosis at these levels. 3. Adjacent segment degenerative changes with moderate spinal stenosis at C6-7, mild-to-moderate spinal stenosis at C3-4, and mild-to-moderate neural foraminal stenosis at both levels.   MRI lumbar spine 02/17/2019: 1. Relatively diffuse lumbar disc and facet degeneration without significant spinal stenosis. 2. Multilevel neural foraminal stenosis, moderate on the left at L5-S1. 3. Normal appearance of the conus medullaris and cauda equina.   MRI thoracic spine without contrast 02/17/2019: 1. Mild thoracic disc and facet degeneration without evidence of neural impingement. 2. Minimal prominence of the central canal of the mid to lower thoracic spinal cord without syrinx.  Left rectus femorus muscle biopsy 02/20/2019:  Active necrotizing inflammatory myopathy. Right chest wall skin biopsy:  Benign skin with fibrosis and mixed superficial and deep inflammation  Neuromuscular Lab - [facility] 03/20/2019:  Myopathy 2 panel - HMGCR 36,000 (normal <2500)  Lab Results  Component Value Date   CKTOTAL 915 (HH) 09/30/2023   CKMB CANCELED 03/21/2020   CKMBINDEX 23.6 (HH) 04/15/2021   TROPONINI 0.03 (HH) 02/17/2019     Lab Results  Component Value Date   HGBA1C 6.3 (H) 06/01/2023     Thank you for allowing me to participate in patient's care.  If I can answer any additional questions, I would be pleased to do so.    Sincerely,    Keelyn Monjaras K. Tobie, DO

## 2023-10-11 NOTE — Patient Instructions (Signed)
Take Cellcept 1000mg  in the morning and 1500mg  at bedtime.  I will check labs (CK, CBC, and CMP) in March.  Wishing you a happy and healthy 2025!

## 2023-10-12 ENCOUNTER — Other Ambulatory Visit (HOSPITAL_COMMUNITY): Payer: Self-pay

## 2023-10-13 ENCOUNTER — Other Ambulatory Visit (HOSPITAL_COMMUNITY): Payer: Self-pay

## 2023-10-13 ENCOUNTER — Other Ambulatory Visit (HOSPITAL_BASED_OUTPATIENT_CLINIC_OR_DEPARTMENT_OTHER): Payer: Self-pay

## 2023-10-13 ENCOUNTER — Other Ambulatory Visit: Payer: Self-pay

## 2023-10-13 ENCOUNTER — Encounter: Payer: Self-pay | Admitting: Pharmacist

## 2023-10-21 ENCOUNTER — Other Ambulatory Visit (HOSPITAL_COMMUNITY): Payer: Self-pay

## 2023-11-20 ENCOUNTER — Other Ambulatory Visit: Payer: Self-pay

## 2023-11-21 ENCOUNTER — Other Ambulatory Visit (HOSPITAL_COMMUNITY): Payer: Self-pay

## 2023-11-21 ENCOUNTER — Other Ambulatory Visit: Payer: Self-pay | Admitting: Family Medicine

## 2023-11-21 ENCOUNTER — Other Ambulatory Visit: Payer: Self-pay

## 2023-11-23 ENCOUNTER — Other Ambulatory Visit (HOSPITAL_COMMUNITY): Payer: Self-pay

## 2023-11-23 ENCOUNTER — Other Ambulatory Visit: Payer: Self-pay

## 2023-11-23 MED ORDER — PANTOPRAZOLE SODIUM 20 MG PO TBEC
20.0000 mg | DELAYED_RELEASE_TABLET | Freq: Every day | ORAL | 3 refills | Status: AC
  Filled 2023-11-23: qty 90, 90d supply, fill #0
  Filled 2024-02-21: qty 90, 90d supply, fill #1
  Filled 2024-05-17: qty 90, 90d supply, fill #2
  Filled 2024-08-15: qty 90, 90d supply, fill #3

## 2023-12-05 ENCOUNTER — Other Ambulatory Visit (HOSPITAL_COMMUNITY): Payer: Self-pay

## 2023-12-09 ENCOUNTER — Other Ambulatory Visit: Payer: Self-pay | Admitting: Family Medicine

## 2023-12-10 LAB — VITAMIN B12: Vitamin B-12: >2000 pg/mL — ABNORMAL HIGH (ref 232–1245)

## 2023-12-10 LAB — COMPREHENSIVE METABOLIC PANEL WITH GFR
ALT: 47 IU/L — ABNORMAL HIGH (ref 0–44)
AST: 50 IU/L — ABNORMAL HIGH (ref 0–40)
Albumin: 4.6 g/dL (ref 3.9–4.9)
Alkaline Phosphatase: 86 IU/L (ref 44–121)
BUN/Creatinine Ratio: 13 (ref 10–24)
BUN: 13 mg/dL (ref 8–27)
Bilirubin Total: 0.5 mg/dL (ref 0.0–1.2)
CO2: 24 mmol/L (ref 20–29)
Calcium: 9.9 mg/dL (ref 8.6–10.2)
Chloride: 101 mmol/L (ref 96–106)
Creatinine, Ser: 0.98 mg/dL (ref 0.76–1.27)
Globulin, Total: 2.5 g/dL (ref 1.5–4.5)
Glucose: 99 mg/dL (ref 70–99)
Potassium: 4.1 mmol/L (ref 3.5–5.2)
Sodium: 140 mmol/L (ref 134–144)
Total Protein: 7.1 g/dL (ref 6.0–8.5)
eGFR: 83 mL/min/1.73

## 2023-12-10 LAB — CK: Total CK: 1298 U/L (ref 41–331)

## 2023-12-10 LAB — HEMOGLOBIN A1C
Est. average glucose Bld gHb Est-mCnc: 174 mg/dL
Hgb A1c MFr Bld: 7.7 % — ABNORMAL HIGH (ref 4.8–5.6)

## 2023-12-12 ENCOUNTER — Encounter: Payer: Self-pay | Admitting: Family Medicine

## 2023-12-16 ENCOUNTER — Other Ambulatory Visit: Payer: Self-pay

## 2023-12-16 ENCOUNTER — Other Ambulatory Visit (HOSPITAL_COMMUNITY): Payer: Self-pay

## 2023-12-16 ENCOUNTER — Ambulatory Visit: Payer: Medicare Other | Admitting: Family Medicine

## 2023-12-16 VITALS — BP 108/64 | HR 72 | Temp 97.4°F | Ht 69.0 in | Wt 186.0 lb

## 2023-12-16 DIAGNOSIS — E538 Deficiency of other specified B group vitamins: Secondary | ICD-10-CM

## 2023-12-16 DIAGNOSIS — E119 Type 2 diabetes mellitus without complications: Secondary | ICD-10-CM

## 2023-12-16 DIAGNOSIS — G8191 Hemiplegia, unspecified affecting right dominant side: Secondary | ICD-10-CM

## 2023-12-16 DIAGNOSIS — G7249 Other inflammatory and immune myopathies, not elsewhere classified: Secondary | ICD-10-CM

## 2023-12-16 DIAGNOSIS — E785 Hyperlipidemia, unspecified: Secondary | ICD-10-CM | POA: Diagnosis not present

## 2023-12-16 DIAGNOSIS — I25118 Atherosclerotic heart disease of native coronary artery with other forms of angina pectoris: Secondary | ICD-10-CM

## 2023-12-16 DIAGNOSIS — Z7984 Long term (current) use of oral hypoglycemic drugs: Secondary | ICD-10-CM | POA: Diagnosis not present

## 2023-12-16 DIAGNOSIS — I480 Paroxysmal atrial fibrillation: Secondary | ICD-10-CM

## 2023-12-16 LAB — MICROALBUMIN / CREATININE URINE RATIO
Creatinine,U: 39.7 mg/dL
Microalb Creat Ratio: 129.7 mg/g — ABNORMAL HIGH (ref 0.0–30.0)
Microalb, Ur: 5.2 mg/dL — ABNORMAL HIGH (ref 0.0–1.9)

## 2023-12-16 MED ORDER — METFORMIN HCL ER 500 MG PO TB24
500.0000 mg | ORAL_TABLET | Freq: Two times a day (BID) | ORAL | 3 refills | Status: AC
Start: 2023-12-16 — End: ?
  Filled 2023-12-16: qty 180, 90d supply, fill #0
  Filled 2024-03-20: qty 180, 90d supply, fill #1
  Filled 2024-06-14: qty 180, 90d supply, fill #2
  Filled 2024-09-07: qty 180, 90d supply, fill #3

## 2023-12-16 NOTE — Patient Instructions (Addendum)
 For 2 weeks do metformin just once a day and after 2 weeks can take twice a day with meals  B12 go to every other day  Please stop by lab before you go If you have mychart- we will send your results within 3 business days of [place] receiving them.  If you do not have mychart- we will call you about results within 5 business days of [place] receiving them.  *please also note that you will see labs on mychart as soon as they post. I will later go in and write notes on them- will say "notes from Dr. Durene Cal"   Recommended follow up: Return in about 4 months (around 04/16/2024) for followup or sooner if needed.Schedule b4 you leave.

## 2023-12-16 NOTE — Progress Notes (Signed)
 Phone [phone] In person visit   Subjective:   Stanley Boyd is a 73 y.o. 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    Priority: High   CAD (coronary artery disease), native coronary artery 07/13/2015    Priority: High   S/P PTCA (percutaneous transluminal coronary angioplasty) 07/08/2015    Priority: High   Elevated PSA 02/25/2017    Priority: Medium    Hemiparesis (HCC) 09/18/2014    Priority: Medium    BPH (benign prostatic hyperplasia) 09/18/2014    Priority: Medium    Hyperlipidemia     Priority: Medium    Bradycardia     Priority: Medium    Diarrhea 07/13/2023    Priority: Low   Polyp of sigmoid colon     Priority: Low   B12 deficiency 02/26/2019    Priority: Low   History of colonic polyps 10/15/2014    Priority: Low   Transaminitis 09/18/2014    Priority: Low    Medications- reviewed and updated Current Outpatient Medications  Medication Sig Dispense Refill   apixaban (ELIQUIS) 5 MG TABS tablet Take 1 tablet (5 mg total) by mouth 2 (two) times daily. 180 tablet 3   Carboxymethylcellulose Sodium (REFRESH TEARS OP) Place 1 drop into both eyes daily as needed (dry eyes).     empagliflozin (JARDIANCE) 10 MG TABS tablet Take 1 tablet (10 mg total) by mouth daily before breakfast. 90 tablet 3   Evolocumab (REPATHA SURECLICK) 140 MG/ML SOAJ Inject 140 mg into the skin every 14 (fourteen) days. 6 mL 3   finasteride (PROSCAR) 5 MG tablet Take 1 tablet (5 mg total) by mouth daily. 90 tablet 3   metFORMIN  (GLUCOPHAGE-XR) 500 MG 24 hr tablet Take 1 tablet (500 mg total) by mouth 2 (two) times daily with a meal. 180 tablet 3   mycophenolate (CELLCEPT) 500 MG tablet Take 2 tablets (1,000 mg total) by mouth in the morning AND 3 tablets (1,500 mg total) every evening. 450 tablet 3   nitroGLYCERIN (NITROSTAT) 0.4 MG SL tablet Place 1 tablet (0.4 mg total) under the tongue every 5 (five) minutes as needed for chest pain. 25 tablet 4   pantoprazole (PROTONIX) 20 MG tablet Take 1 tablet (20 mg total) by mouth daily. 90 tablet 3   sildenafil (VIAGRA) 100 MG tablet Take 1 tablet (100 mg total) by mouth daily as needed for erectile dysfunction. 10 tablet 11   tamsulosin (FLOMAX) 0.4 MG CAPS capsule Take 1 capsule (0.4 mg total) by mouth daily. 90 capsule 3   VASCEPA 1 g capsule Take 2 capsules (2 g total) by mouth 2 (two) times daily. 360 capsule 3   No current facility-administered medications for this visit.     Objective:  BP 108/64   Pulse 72   Temp (!) 97.4 F (36.3 C)   Ht 5\' 9"  (1.753 m)   Wt 186 lb (84.4 kg)   SpO2 99%   BMI 27.47 kg/m  Gen: NAD, resting comfortably CV: RRR no murmurs rubs or gallops Lungs: CTAB  no crackles, wheeze, rhonchi Ext: trace edema Skin: warm, dry     Assessment and Plan   # Diabetes- started out as steroid induced hyperglycemia on long term prednisone S: Medication:jardiance 10 mg  - diarrhea on metformin 1000 mg IR twice daily   Lab Results  Component Value Date   HGBA1C 7.7 (H) 12/09/2023   HGBA1C 6.3 (H) 06/01/2023   HGBA1C 6.7 (H) 11/15/2022   A/P: a1c is up on jardiance alone- had been on metformin 1000mg  IR twice daily but causing diarrhea- we opted to try lower dose 500 mg XR twice daily -  For 2 weeks do metformin just once a day and after 2 weeks can take twice a day with meals  # 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 Eliane Decree: Medication: CellCept 1000 mg in am and 1500mg  in pm, prednisone wean per Dr. Allena Katz- off in July 2024 -prior   IVIG  A/P: thankfully symptoms are not worsening but his CK is trending up- visit within 2 weeks with Dr. Allena Katz to plan next steps- I reached out to her and she thinks LFT mild elevations are related  -update handicap placard   # Optic neuropathy bilateral eyes S: Originally cared for by [facility] and later transition to [facility] ophthalmology.  Required high-dose steroids in the past potentially related to [facility] vaccination and with severe symptoms around time of vaccination-we have opted to defer further COVID-19 vaccination as result -Symptoms:no vision issues despite being off prednisone   A/P: chronic issues that he is essentially used to be not worsening   # Atrial fibrillation-follows with Dr. Jacinto Halim- related to cataract surgery S: Rate controlled with no medication  Anticoagulated with Eliquis 5 mg twice daily  A/P:  appropriately anticoagulated and rate controlled- continue current medicine  - has upcoming visit in mid may   # CAD-follows with Dr. Jacinto Halim  #hyperlipidemia S: Medication:Eliquis 5 mg twice daily (off aspirin), Vascepa 2g twice daily , Repatha 140 mg every 2 weeks.  - History of mild LFT elevations   at times could be related to CK issues -symptoms:  no chest pain or shortness of breath and no fatigue (his most usual symptoms). Left lateral rib is tender to touch since yesterday though but doesn't bother him with exertion Lab Results  Component Value Date   CHOL 134 06/01/2023   HDL 40 06/01/2023   LDLCALC 52 06/01/2023   LDLDIRECT 58 02/26/2020   TRIG 269 (H) 06/01/2023   CHOLHDL 2 01/25/2022   A/P: CAD  asymptomatic continue current medications   Hyperlipidemia -at goal with LDL under 70- continue current medications- getting sugar down may help triglyceride(s) as well   # GERD S:Medication:  pantoprazole 20 mg daily- failed coming off A/P: doing well- continue current medications    #B12 deficiency Still on oral B12- 1 everyday- going to do every other day  with this being high and recheck next visit    #BPH- -on finasteride 5 mg daily for bph  as well as tamsulosin - upcoming urology visit next week  # Right sided hemiparesis-history of falling a tree stand and required cervical and low back surgery-decree strength on the right and contracture of the hand- ongoing   #Prevnar 20- holding off on this for now - want CK to be in better position  Recommended follow up: Return in about 4 months (around 04/16/2024) for followup or sooner if needed.Schedule b4 you leave. Future Appointments  Date Time Provider Department Center  12/28/2023 10:50 AM Allena Katz,  Donika K, DO [facility] None  03/02/2024  2:00 PM Yates Decamp, MD [facility]  09/03/2024 11:20 AM [facility] ANNUAL WELLNESS VISIT 1 [facility]   Lab/Order associations:   ICD-10-CM   1. Coronary artery disease involving native coronary artery of native heart with other form of angina pectoris (HCC)  I25.118     2. Diabetes mellitus without complication (HCC)  E11.9 Urine Microalbumin w/creat. ratio    3. Hyperlipidemia, unspecified hyperlipidemia type  E78.5     4. Hemiparesis of right dominant side due to non-cerebrovascular etiology (HCC)  G81.91     5. B12 deficiency  E53.8     6. Paroxysmal atrial fibrillation (HCC)  I48.0       Meds ordered this encounter  Medications   metFORMIN (GLUCOPHAGE-XR) 500 MG 24 hr tablet    Sig: Take 1 tablet (500 mg total) by mouth 2 (two) times daily with a meal.    Dispense:  180 tablet    Refill:  3    Return precautions advised.  Tana Conch, MD

## 2023-12-17 ENCOUNTER — Other Ambulatory Visit (HOSPITAL_COMMUNITY): Payer: Self-pay

## 2023-12-19 ENCOUNTER — Encounter: Payer: Self-pay | Admitting: Family Medicine

## 2023-12-19 ENCOUNTER — Other Ambulatory Visit: Payer: Self-pay

## 2023-12-19 ENCOUNTER — Other Ambulatory Visit: Payer: Self-pay | Admitting: Family Medicine

## 2023-12-19 ENCOUNTER — Other Ambulatory Visit (HOSPITAL_BASED_OUTPATIENT_CLINIC_OR_DEPARTMENT_OTHER): Payer: Self-pay

## 2023-12-19 ENCOUNTER — Encounter (HOSPITAL_COMMUNITY): Payer: Self-pay | Admitting: Pharmacist

## 2023-12-19 ENCOUNTER — Other Ambulatory Visit (HOSPITAL_COMMUNITY): Payer: Self-pay

## 2023-12-19 DIAGNOSIS — I2510 Atherosclerotic heart disease of native coronary artery without angina pectoris: Secondary | ICD-10-CM

## 2023-12-19 DIAGNOSIS — E119 Type 2 diabetes mellitus without complications: Secondary | ICD-10-CM

## 2023-12-19 MED ORDER — EMPAGLIFLOZIN 25 MG PO TABS
25.0000 mg | ORAL_TABLET | Freq: Every day | ORAL | 3 refills | Status: AC
  Filled 2023-12-19: qty 90, 90d supply, fill #0
  Filled 2024-03-20: qty 90, 90d supply, fill #1
  Filled 2024-06-12: qty 90, 90d supply, fill #2
  Filled 2024-09-12: qty 90, 90d supply, fill #3

## 2023-12-20 ENCOUNTER — Other Ambulatory Visit (HOSPITAL_COMMUNITY): Payer: Self-pay

## 2023-12-20 ENCOUNTER — Encounter: Payer: Self-pay | Admitting: Pharmacist

## 2023-12-20 ENCOUNTER — Other Ambulatory Visit: Payer: Self-pay

## 2023-12-20 ENCOUNTER — Telehealth: Payer: Self-pay | Admitting: Pharmacy Technician

## 2023-12-20 NOTE — Telephone Encounter (Signed)
 Pharmacy Patient Advocate Encounter  Received notification from [facility] that Prior Authorization for Repatha has been APPROVED from 10/12/23 to 12/19/24   PA #/Case ID/Reference #: [id number]

## 2023-12-20 NOTE — Telephone Encounter (Signed)
 Pharmacy Patient Advocate Encounter   Received notification from CoverMyMeds that prior authorization for Repatha is required/requested.   Insurance verification completed.   The patient is insured through [employer] .   Per test claim: PA required; PA submitted to above mentioned insurance via CoverMyMeds Key/confirmation #/EOC [id number] Status is pending

## 2023-12-21 ENCOUNTER — Other Ambulatory Visit (HOSPITAL_COMMUNITY): Payer: Self-pay

## 2023-12-23 ENCOUNTER — Other Ambulatory Visit (HOSPITAL_COMMUNITY): Payer: Self-pay

## 2023-12-28 ENCOUNTER — Encounter: Payer: Self-pay | Admitting: Neurology

## 2023-12-28 ENCOUNTER — Other Ambulatory Visit (HOSPITAL_COMMUNITY): Payer: Self-pay

## 2023-12-28 ENCOUNTER — Other Ambulatory Visit: Payer: Self-pay

## 2023-12-28 ENCOUNTER — Other Ambulatory Visit

## 2023-12-28 ENCOUNTER — Ambulatory Visit: Payer: Medicare Other | Admitting: Neurology

## 2023-12-28 VITALS — BP 116/59 | HR 87 | Ht 69.0 in | Wt 189.0 lb

## 2023-12-28 DIAGNOSIS — G7249 Other inflammatory and immune myopathies, not elsewhere classified: Secondary | ICD-10-CM | POA: Diagnosis not present

## 2023-12-28 LAB — CK: Total CK: 677 U/L — ABNORMAL HIGH (ref 19–278)

## 2023-12-28 MED ORDER — PREDNISONE 5 MG PO TABS
5.0000 mg | ORAL_TABLET | Freq: Every day | ORAL | 1 refills | Status: DC
Start: 2023-12-28 — End: 2024-04-20
  Filled 2023-12-28: qty 90, 90d supply, fill #0

## 2023-12-28 NOTE — Progress Notes (Signed)
 Follow-up Visit   Date: 12/28/23   Maxamus Colao MRN: [id number] DOB: 1951-04-23    Stanley Boyd is a 73 y.o. 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  The patient was accompanied to the clinic by self.   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  He has been doing great on Cellcept 1000mg  BID and slow prednisone taper.  Prednisone was completely tapered off ~ August 2024. His CK started to rise from [phone].  Despite this, his exam shows normal motor strength and he continues to stay active and denies weakness. We discussed management options and given that he is asymptomatic, we will start low dose prednisone.  If he becomes symptomatic, this can be increased or IVIG can be restarted.  Previously managed on a long course of IVIG (discontinued in 01/2021), prednisone (stopped in August 2024), and Cellcept (increased to 1000mg  BID in Feb 2022 and again to 2500mg /d in Dec 2024) Start prednisone 5mg  daily.   Check CK  2.  Bilateral optic neuropathy, stable.  Previously evaluated by [facility] neuroophthalmology  Return to clinic in 2 months  ------------------------------------------------------------------------ Interim History: He is here for follow-up visit.  He has been doing great and tells me that he has felt the best in a long time over the past 4 months.  He has been able to reduce prednisone to 2.5mg , which he has been on for the past month.  No new weakness.  CK continues to fluctuate ~ 600-700.   UPDATE 10/11/2023:  He is here for follow-up visit.  He has been off prednisone since ~ August after a very long taper.  His most recent CK was elevated from 700 > 900.  He denies any weakness.  If anything, he has noticed  that it is harder for him to get up off the floor, but feels this may more age-related than true weakness.  He is physically very active and has not noticed any new changes with his endurance or activity level.  No interval falls or illnesses.   UPDATE 12/28/2023:  He is here for follow-up visit.  CK has been rising over the past few months 704 > 915 >1298.  He has been feeling well and denies any new weakness.  He is still able to continue the same level of physical activity. No new cramps.  Medications:  Current Outpatient Medications on File Prior to Visit  Medication Sig Dispense Refill   apixaban (ELIQUIS) 5 MG TABS tablet Take 1 tablet (5 mg total) by mouth 2 (two) times daily. 180 tablet 3   Carboxymethylcellulose Sodium (REFRESH TEARS OP) Place 1 drop into both eyes daily as needed (dry eyes).     empagliflozin (JARDIANCE) 25 MG TABS tablet Take 1 tablet (25 mg total) by mouth daily before breakfast. 90 tablet 3   Evolocumab (REPATHA SURECLICK) 140 MG/ML SOAJ Inject 140 mg into the skin every 14 (fourteen) days. 6 mL 3   finasteride (PROSCAR) 5 MG tablet Take 1 tablet (5 mg total) by mouth daily. 90 tablet 3   metFORMIN (GLUCOPHAGE-XR) 500 MG 24 hr tablet Take 1 tablet (500 mg total) by mouth 2 (two) times daily with a meal. 180 tablet 3   mycophenolate (CELLCEPT) 500 MG tablet Take 2 tablets (1,000 mg total) by mouth in the morning AND 3 tablets (1,500  mg total) every evening. 450 tablet 3   nitroGLYCERIN (NITROSTAT) 0.4 MG SL tablet Place 1 tablet (0.4 mg total) under the tongue every 5 (five) minutes as needed for chest pain. 25 tablet 4   pantoprazole (PROTONIX) 20 MG tablet Take 1 tablet (20 mg total) by mouth daily. 90 tablet 3   sildenafil (VIAGRA) 100 MG tablet Take 1 tablet (100 mg total) by mouth daily as needed for erectile dysfunction. 10 tablet 11   tamsulosin (FLOMAX) 0.4 MG CAPS capsule Take 1 capsule (0.4 mg total) by mouth daily. 90 capsule 3   VASCEPA 1 g capsule Take 2  capsules (2 g total) by mouth 2 (two) times daily. 360 capsule 3   No current facility-administered medications on file prior to visit.    Allergies:  Allergies  Allergen Reactions   Covid-19 Mrna Vacc (Moderna) Other (See Comments)    Optic neuropathy after original 2 vaccines    Statins Other (See Comments)    Myositis    Vital Signs:  BP (!) 116/59   Pulse 87   Ht 5\' 9"  (1.753 m)   Wt 189 lb (85.7 kg)   SpO2 97%   BMI 27.91 kg/m   Neurological Exam: MENTAL STATUS including orientation to time, place, person, recent and remote memory, attention span and concentration, language, and fund of knowledge is normal.  Speech is not dysarthric.  CRANIAL NERVES: Normal conjugate, extra-ocular eye movements in all directions of gaze.  No ptosis bilaterally.  Face is symmetric.   MOTOR:  Motor strength is 5/5 throughout, except R hand is 28/5 (old from stroke). No atrophy, fasciculations or abnormal movements.  No pronator drift.   COORDINATION/GAIT:   He is able to stand up from chair without pushing off easily. He can stand up from a squat. Gait is normal.  Data: MRI cervical spine without contrast 02/17/2019: 1. 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 2. Solid anterior fusion at C4-5 and C5-6 without spinal stenosis at these levels. 3. Adjacent segment degenerative changes with moderate spinal stenosis at C6-7, mild-to-moderate spinal stenosis at C3-4, and mild-to-moderate neural foraminal stenosis at both levels.   MRI lumbar spine 02/17/2019: 1. Relatively diffuse lumbar disc and facet degeneration without significant spinal stenosis. 2. Multilevel neural foraminal stenosis, moderate on the left at L5-S1. 3. Normal appearance of the conus medullaris and cauda equina.   MRI thoracic spine without contrast 02/17/2019: 1. Mild thoracic disc and facet degeneration without evidence of neural impingement. 2. Minimal prominence of the central canal of the mid to lower thoracic spinal cord without  syrinx.  Left rectus femorus muscle biopsy 02/20/2019:  Active necrotizing inflammatory myopathy. Right chest wall skin biopsy:  Benign skin with fibrosis and mixed superficial and deep inflammation  Neuromuscular Lab - [facility] 03/20/2019:  Myopathy 2 panel - HMGCR 36,000 (normal <2500)   CK Total  Latest Ref Rng 41 - 331 U/L  10/07/2022 677 (HH)   12/10/2022 707 (HH)   03/14/2023 637 (HH)   06/01/2023 704 (HH)   09/30/2023 915 (HH)   12/09/2023 1,298 (HH)    Lab Results  Component Value Date   CKTOTAL 1,298 (HH) 12/09/2023   CKMB CANCELED 03/21/2020   CKMBINDEX 23.6 (HH) 04/15/2021   TROPONINI 0.03 (HH) 02/17/2019    Lab Results  Component Value Date   HGBA1C 7.7 (H) 12/09/2023     Thank you for allowing me to participate in patient's care.  If I can answer any additional questions, I would be pleased  to do so.    Sincerely,    Rasool Rommel K. Allena Katz, DO

## 2023-12-28 NOTE — Patient Instructions (Signed)
 Check CK today  Start prednisone 5mg  daily

## 2023-12-29 ENCOUNTER — Other Ambulatory Visit (HOSPITAL_COMMUNITY): Payer: Self-pay

## 2023-12-29 ENCOUNTER — Encounter: Payer: Self-pay | Admitting: Neurology

## 2023-12-29 MED ORDER — TAMSULOSIN HCL 0.4 MG PO CAPS
0.4000 mg | ORAL_CAPSULE | Freq: Every day | ORAL | 3 refills | Status: AC
  Filled 2024-02-15: qty 90, 90d supply, fill #0
  Filled 2024-05-14: qty 90, 90d supply, fill #1
  Filled 2024-08-12: qty 90, 90d supply, fill #2
  Filled 2024-11-07: qty 90, 90d supply, fill #3

## 2023-12-29 MED ORDER — FINASTERIDE 5 MG PO TABS
5.0000 mg | ORAL_TABLET | Freq: Every day | ORAL | 3 refills | Status: AC
  Filled 2024-02-15: qty 90, 90d supply, fill #0
  Filled 2024-05-14: qty 90, 90d supply, fill #1
  Filled 2024-08-12: qty 90, 90d supply, fill #2
  Filled 2024-11-07: qty 90, 90d supply, fill #3

## 2024-01-11 ENCOUNTER — Other Ambulatory Visit (HOSPITAL_COMMUNITY): Payer: Self-pay

## 2024-01-18 ENCOUNTER — Other Ambulatory Visit: Payer: Self-pay

## 2024-02-12 ENCOUNTER — Other Ambulatory Visit: Payer: Self-pay | Admitting: Cardiology

## 2024-02-12 DIAGNOSIS — E7800 Pure hypercholesterolemia, unspecified: Secondary | ICD-10-CM

## 2024-02-12 DIAGNOSIS — I2510 Atherosclerotic heart disease of native coronary artery without angina pectoris: Secondary | ICD-10-CM

## 2024-02-14 ENCOUNTER — Other Ambulatory Visit: Payer: Self-pay

## 2024-02-14 ENCOUNTER — Other Ambulatory Visit (HOSPITAL_COMMUNITY): Payer: Self-pay

## 2024-02-14 ENCOUNTER — Other Ambulatory Visit: Payer: Self-pay | Admitting: Family Medicine

## 2024-02-14 MED ORDER — VASCEPA 1 G PO CAPS
2.0000 g | ORAL_CAPSULE | Freq: Two times a day (BID) | ORAL | 0 refills | Status: DC
  Filled 2024-02-14: qty 120, 30d supply, fill #0

## 2024-02-15 ENCOUNTER — Encounter: Payer: Self-pay | Admitting: Family Medicine

## 2024-02-15 LAB — CK: Total CK: 1029 U/L (ref 41–331)

## 2024-02-16 ENCOUNTER — Other Ambulatory Visit: Payer: Self-pay

## 2024-02-16 ENCOUNTER — Other Ambulatory Visit (HOSPITAL_COMMUNITY): Payer: Self-pay

## 2024-02-21 ENCOUNTER — Other Ambulatory Visit (HOSPITAL_COMMUNITY): Payer: Self-pay

## 2024-02-22 ENCOUNTER — Ambulatory Visit: Admitting: Neurology

## 2024-02-29 ENCOUNTER — Ambulatory Visit: Payer: Medicare Other | Admitting: Cardiology

## 2024-03-02 ENCOUNTER — Ambulatory Visit: Payer: Medicare Other | Attending: Cardiology | Admitting: Cardiology

## 2024-03-02 ENCOUNTER — Encounter: Payer: Self-pay | Admitting: Cardiology

## 2024-03-02 VITALS — BP 126/60 | HR 51 | Ht 69.0 in | Wt 187.0 lb

## 2024-03-02 DIAGNOSIS — T466X5A Adverse effect of antihyperlipidemic and antiarteriosclerotic drugs, initial encounter: Secondary | ICD-10-CM

## 2024-03-02 DIAGNOSIS — G720 Drug-induced myopathy: Secondary | ICD-10-CM | POA: Diagnosis not present

## 2024-03-02 DIAGNOSIS — I2510 Atherosclerotic heart disease of native coronary artery without angina pectoris: Secondary | ICD-10-CM

## 2024-03-02 DIAGNOSIS — I480 Paroxysmal atrial fibrillation: Secondary | ICD-10-CM | POA: Diagnosis not present

## 2024-03-02 DIAGNOSIS — E7800 Pure hypercholesterolemia, unspecified: Secondary | ICD-10-CM

## 2024-03-02 DIAGNOSIS — T466X5D Adverse effect of antihyperlipidemic and antiarteriosclerotic drugs, subsequent encounter: Secondary | ICD-10-CM

## 2024-03-02 NOTE — Patient Instructions (Signed)
 Medication Instructions:  Your physician recommends that you continue on your current medications as directed. Please refer to the Current Medication list given to you today.  *If you need a refill on your cardiac medications before your next appointment, please call your pharmacy*  Lab Work: none If you have labs (blood work) drawn today and your tests are completely normal, you will receive your results only by: MyChart Message (if you have MyChart) OR A paper copy in the mail If you have any lab test that is abnormal or we need to change your treatment, we will call you to review the results.  Testing/Procedures: none  Follow-Up: At [facility], you and your health needs are our priority.  As part of our continuing mission to provide you with exceptional heart care, our providers are all part of one team.  This team includes your primary Cardiologist (physician) and Advanced Practice Providers or APPs (Physician Assistants and Nurse Practitioners) who all work together to provide you with the care you need, when you need it.  Your next appointment:   12 month(s)  Provider:   Knox Perl, MD    We recommend signing up for the patient portal called "MyChart".  Sign up information is provided on this After Visit Summary.  MyChart is used to connect with patients for Virtual Visits (Telemedicine).  Patients are able to view lab/test results, encounter notes, upcoming appointments, etc.  Non-urgent messages can be sent to your provider as well.   To learn more about what you can do with MyChart, go to [email or web address].   Other Instructions

## 2024-03-02 NOTE — Progress Notes (Unsigned)
 Cardiology Office Note:  .   Date:  03/05/2024  ID:  Stanley Boyd, DOB 1951-05-19, MRN [id number] PCP: Almira Jaeger, MD  [facility] Providers Cardiologist:  Knox Perl, MD   History of Present Illness: .   Stanley Boyd is a 73 y.o. Caucasian male with CAD, staged PCI, first on 07/08/2015 with 3.0x38 & 3.0x19mm Resolute DES to prox and mid LAD, then on 07/17/2015 with stenting of the bifurcating large OM1 and proximal circumflex with implantation of 3.0 x 28 mm in the circumflex and 3.0 x 24 mm Synergy DES with [employer]. 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    Discussed the use of AI scribe software for clinical note transcription with the patient, who gave verbal consent to proceed.  History of Present Illness Stanley Boyd is a 73 year old male with coronary artery disease and paroxysmal atrial fibrillation who presents for a routine follow-up. He is accompanied by his wife. He has coronary artery disease with stents placed in 2016 and 2017. His last cardiac catheterization was in May 2023, with no subsequent chest pain or cardiac symptoms. He takes Repatha  for cholesterol management due to necrotizing myositis from statins.  He has paroxysmal atrial fibrillation, first noted during cataract surgery in February 2023. He is on Eliquis  for anticoagulation without significant bleeding complications. A monitor showed very brief episodes of AFib, but he remains asymptomatic.  He prefers to continue anticoagulation. He engages in outdoor activities, including yard work, without experiencing chest pain, palpitations, or new cardiac symptoms.   Labs   Lab Results  Component Value Date   CHOL 134 06/01/2023   HDL 40 06/01/2023   LDLCALC 52 06/01/2023   LDLDIRECT 58 02/26/2020    TRIG 269 (H) 06/01/2023   CHOLHDL 2 01/25/2022   Lab Results  Component Value Date   NA 140 12/09/2023   K 4.1 12/09/2023   CO2 24 12/09/2023   GLUCOSE 99 12/09/2023   BUN 13 12/09/2023   CREATININE 0.98 12/09/2023   CALCIUM  9.9 12/09/2023   GFR 88.00 05/21/2022   EGFR 83 12/09/2023   GFRNONAA 80 08/22/2020      Latest Ref Rng & Units 12/09/2023    1:44 PM 06/01/2023    2:08 PM 11/15/2022   11:52 AM  BMP  Glucose 70 - 99 mg/dL 99  782  956   BUN 8 - 27 mg/dL 13  18  10    Creatinine 0.76 - 1.27 mg/dL 2.13  0.86  5.78   BUN/Creat Ratio 10 - 24 13  20  11    Sodium 134 - 144 mmol/L 140  142  142   Potassium 3.5 - 5.2 mmol/L 4.1  4.5  5.0   Chloride 96 - 106 mmol/L 101  102  98   CO2 20 - 29 mmol/L 24  22  24    Calcium  8.6 - 10.2 mg/dL 9.9  9.9  46.9       Latest Ref Rng & Units 06/01/2023    2:08 PM 11/15/2022   11:52 AM 05/21/2022   12:14 PM  CBC  WBC 3.4 - 10.8 x10E3/uL 5.5  7.3  7.9   Hemoglobin 13.0 - 17.7 g/dL 62.9  52.8  41.3   Hematocrit 37.5 - 51.0 % 43.6  45.3  40.3   Platelets  150 - 450 x10E3/uL 233  230  183.0    Lab Results  Component Value Date   HGBA1C 7.7 (H) 12/09/2023    Lab Results  Component Value Date   TSH 4.150 06/01/2023    ROS  Review of Systems  Cardiovascular:  Negative for chest pain, dyspnea on exertion and leg swelling.    Physical Exam:   VS:  BP 126/60   Pulse (!) 51   Ht 5\' 9"  (1.753 m)   Wt 187 lb (84.8 kg)   SpO2 99%   BMI 27.62 kg/m    Wt Readings from Last 3 Encounters:  03/02/24 187 lb (84.8 kg)  12/28/23 189 lb (85.7 kg)  12/16/23 186 lb (84.4 kg)    Physical Exam Neck:     Vascular: No carotid bruit or JVD.  Cardiovascular:     Rate and Rhythm: Normal rate and regular rhythm.     Pulses: Intact distal pulses.     Heart sounds: Normal heart sounds. No murmur heard.    No gallop.  Pulmonary:     Effort: Pulmonary effort is normal.     Breath sounds: Normal breath sounds.  Abdominal:     General: Bowel sounds  are normal.     Palpations: Abdomen is soft.  Musculoskeletal:     Right lower leg: No edema.     Left lower leg: No edema.    Studies Reviewed: Aaron Aas    Left Heart Catheterization 02/09/22:    LAD: Large vessel.  Proximal and mid LAD 3.0 x 38 overlapped with a 3.5 x 15 mm resolute DES placed 07/08/2015 is widely patent.   LCx: Large vessel.  Proximal circumflex-large OM1 T stenting 04/16/2016 with 3.0 x 28 and a 3.0 x 24 mm Synergy DES is widely patent.     EKG:    EKG Interpretation Date/Time:  Friday Mar 02 2024 13:53:47 EDT Ventricular Rate:  51 PR Interval:  162 QRS Duration:  98 QT Interval:  460 QTC Calculation: 423 R Axis:   70  Text Interpretation: EKG 03/02/2024: Ectopic atrial rhythm at the rate of 51 bpm, normal axis, incomplete right bundle branch block.  LVH with repolarization abnormality, cannot exclude lateral ischemia.  Compared to 03/02/2024, no change. 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    Statins Other (See Comments)    Myositis     Current Outpatient Medications:    apixaban  (ELIQUIS ) 5 MG TABS tablet, Take 1 tablet (5 mg total) by mouth 2 (two) times daily., Disp: 180 tablet, Rfl: 3   Carboxymethylcellulose Sodium (REFRESH TEARS OP), Place 1 drop into both eyes daily as needed (dry eyes)., Disp: , Rfl:    empagliflozin  (JARDIANCE ) 25 MG TABS tablet, Take 1 tablet (25 mg total) by mouth daily before breakfast., Disp: 90 tablet, Rfl: 3   Evolocumab  (REPATHA  SURECLICK) 140 MG/ML SOAJ, Inject 140 mg into the skin every 14 (fourteen) days., Disp: 6 mL, Rfl: 3   finasteride  (PROSCAR ) 5 MG tablet, Take 1 tablet (5 mg total) by mouth daily., Disp: 90 tablet, Rfl: 3   metFORMIN  (GLUCOPHAGE -XR) 500 MG 24 hr tablet, Take 1 tablet (500 mg total) by mouth 2 (two) times daily with a  meal., Disp: 180 tablet, Rfl: 3   mycophenolate  (CELLCEPT ) 500 MG tablet, Take 2 tablets (  1,000 mg total) by mouth in the morning AND 3 tablets (1,500 mg total) every evening., Disp: 450 tablet, Rfl: 3   nitroGLYCERIN  (NITROSTAT ) 0.4 MG SL tablet, Place 1 tablet (0.4 mg total) under the tongue every 5 (five) minutes as needed for chest pain., Disp: 25 tablet, Rfl: 4   pantoprazole  (PROTONIX ) 20 MG tablet, Take 1 tablet (20 mg total) by mouth daily., Disp: 90 tablet, Rfl: 3   predniSONE  (DELTASONE ) 5 MG tablet, Take 1 tablet (5 mg total) by mouth daily with breakfast., Disp: 90 tablet, Rfl: 1   sildenafil  (VIAGRA ) 100 MG tablet, Take 1 tablet (100 mg total) by mouth daily as needed for erectile dysfunction., Disp: 10 tablet, Rfl: 11   tamsulosin  (FLOMAX ) 0.4 MG CAPS capsule, Take 1 capsule (0.4 mg total) by mouth daily., Disp: 90 capsule, Rfl: 3   VASCEPA  1 g capsule, Take 2 capsules (2 g total) by mouth 2 (two) times daily., Disp: 120 capsule, Rfl: 0   No orders of the defined types were placed in this encounter.    There are no discontinued medications.   ASSESSMENT AND PLAN: .      ICD-10-CM   1. Coronary artery disease involving native coronary artery of native heart without angina pectoris  I25.10 EKG 12-Lead    2. Paroxysmal atrial fibrillation (HCC)  I48.0 EKG 12-Lead    3. Hypercholesteremia  E78.00     4. 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 Last catheterization in May 2023 showed stents from 2016 and 2017 are patent. No angina reported. EKG indicates no changes, suggesting a favorable long-term prognosis with decreased risk of cardiac events post-procedure. - Continue current management and medications. - Follow up in one year.  Paroxysmal atrial fibrillation Paroxysmal atrial fibrillation detected in 2023 during cataract surgery, likely due to excessive sedation.  Brief episodes noted, but currently maintaining regular rhythm. Tolerating Eliquis  well with stable blood counts and normal renal function.  He has not had any recurrence of atrial fibrillation.  But both patient and his wife were to continue anticoagulation.  Continue anticoagulation due to minimal AFib episodes and absence of bleeding complications. - Continue Eliquis  for anticoagulation.  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 Lipids are well-controlled with Repatha . - Continue Repatha  for lipid management.  Hyperlipidemia Hyperlipidemia is well-controlled with Repatha . Intolerant to statins due to necrotizing myositis. - Continue Repatha  for lipid management.  Signed,  Knox Perl, MD, [facility] 03/05/2024, 4:59 PM [facility] [address] [place], [place] [address] Phone: [phone]. Fax:  [phone]

## 2024-03-07 ENCOUNTER — Other Ambulatory Visit: Payer: Self-pay | Admitting: Cardiology

## 2024-03-07 ENCOUNTER — Other Ambulatory Visit: Payer: Self-pay | Admitting: Family Medicine

## 2024-03-07 DIAGNOSIS — G720 Drug-induced myopathy: Secondary | ICD-10-CM

## 2024-03-07 DIAGNOSIS — I2510 Atherosclerotic heart disease of native coronary artery without angina pectoris: Secondary | ICD-10-CM

## 2024-03-07 DIAGNOSIS — E7800 Pure hypercholesterolemia, unspecified: Secondary | ICD-10-CM

## 2024-03-08 ENCOUNTER — Other Ambulatory Visit: Payer: Self-pay

## 2024-03-08 ENCOUNTER — Other Ambulatory Visit (HOSPITAL_COMMUNITY): Payer: Self-pay

## 2024-03-08 MED ORDER — REPATHA SURECLICK 140 MG/ML ~~LOC~~ SOAJ
1.0000 mL | SUBCUTANEOUS | 3 refills | Status: AC
  Filled 2024-03-08: qty 6, 84d supply, fill #0
  Filled 2024-05-14: qty 6, 84d supply, fill #1
  Filled 2024-08-15: qty 6, 84d supply, fill #2
  Filled 2024-11-05: qty 6, 84d supply, fill #3

## 2024-03-08 MED ORDER — SILDENAFIL CITRATE 100 MG PO TABS
100.0000 mg | ORAL_TABLET | Freq: Every day | ORAL | 11 refills | Status: AC | PRN
  Filled 2024-03-08: qty 10, 30d supply, fill #0
  Filled 2024-08-09: qty 10, 30d supply, fill #1

## 2024-03-20 ENCOUNTER — Other Ambulatory Visit: Payer: Self-pay | Admitting: Cardiology

## 2024-03-20 ENCOUNTER — Other Ambulatory Visit: Payer: Self-pay

## 2024-03-20 ENCOUNTER — Other Ambulatory Visit (HOSPITAL_COMMUNITY): Payer: Self-pay

## 2024-03-20 DIAGNOSIS — I2510 Atherosclerotic heart disease of native coronary artery without angina pectoris: Secondary | ICD-10-CM

## 2024-03-20 DIAGNOSIS — E7800 Pure hypercholesterolemia, unspecified: Secondary | ICD-10-CM

## 2024-03-20 MED ORDER — VASCEPA 1 G PO CAPS
2.0000 g | ORAL_CAPSULE | Freq: Two times a day (BID) | ORAL | 9 refills | Status: AC
  Filled 2024-03-20: qty 120, 30d supply, fill #0
  Filled 2024-04-15: qty 120, 30d supply, fill #1
  Filled 2024-05-15: qty 120, 30d supply, fill #2
  Filled 2024-06-14: qty 120, 30d supply, fill #3
  Filled 2024-07-12: qty 120, 30d supply, fill #4
  Filled 2024-08-09: qty 120, 30d supply, fill #5
  Filled 2024-09-08: qty 120, 30d supply, fill #6
  Filled 2024-10-06: qty 120, 30d supply, fill #7
  Filled 2024-11-05: qty 120, 30d supply, fill #8

## 2024-03-21 ENCOUNTER — Other Ambulatory Visit: Payer: Self-pay

## 2024-04-05 ENCOUNTER — Encounter: Payer: Self-pay | Admitting: Neurology

## 2024-04-06 ENCOUNTER — Other Ambulatory Visit: Payer: Self-pay | Admitting: Family Medicine

## 2024-04-06 ENCOUNTER — Other Ambulatory Visit: Payer: Self-pay

## 2024-04-06 DIAGNOSIS — Z5181 Encounter for therapeutic drug level monitoring: Secondary | ICD-10-CM

## 2024-04-06 DIAGNOSIS — G7249 Other inflammatory and immune myopathies, not elsewhere classified: Secondary | ICD-10-CM

## 2024-04-07 LAB — CBC/DIFF AMBIGUOUS DEFAULT
Basophils Absolute: 0.1 10*3/uL (ref 0.0–0.2)
Basos: 1 %
EOS (ABSOLUTE): 0.1 10*3/uL (ref 0.0–0.4)
Eos: 1 %
Hematocrit: 47.6 % (ref 37.5–51.0)
Hemoglobin: 15.1 g/dL (ref 13.0–17.7)
Immature Grans (Abs): 0 10*3/uL (ref 0.0–0.1)
Immature Granulocytes: 0 %
Lymphocytes Absolute: 2.3 10*3/uL (ref 0.7–3.1)
Lymphs: 41 %
MCH: 29 pg (ref 26.6–33.0)
MCHC: 31.7 g/dL (ref 31.5–35.7)
MCV: 91 fL (ref 79–97)
Monocytes Absolute: 0.6 10*3/uL (ref 0.1–0.9)
Monocytes: 10 %
Neutrophils Absolute: 2.6 10*3/uL (ref 1.4–7.0)
Neutrophils: 47 %
Platelets: 244 10*3/uL (ref 150–450)
RBC: 5.21 x10E6/uL (ref 4.14–5.80)
RDW: 13.8 % (ref 11.6–15.4)
WBC: 5.6 10*3/uL (ref 3.4–10.8)

## 2024-04-07 LAB — COMPREHENSIVE METABOLIC PANEL WITH GFR
ALT: 56 IU/L — ABNORMAL HIGH (ref 0–44)
AST: 54 IU/L — ABNORMAL HIGH (ref 0–40)
Albumin: 4.7 g/dL (ref 3.9–4.9)
Alkaline Phosphatase: 73 IU/L (ref 44–121)
BUN/Creatinine Ratio: 13 (ref 10–24)
BUN: 14 mg/dL (ref 8–27)
Bilirubin Total: 0.3 mg/dL (ref 0.0–1.2)
CO2: 24 mmol/L (ref 20–29)
Calcium: 10.3 mg/dL — ABNORMAL HIGH (ref 8.6–10.2)
Chloride: 101 mmol/L (ref 96–106)
Creatinine, Ser: 1.09 mg/dL (ref 0.76–1.27)
Globulin, Total: 2.6 g/dL (ref 1.5–4.5)
Glucose: 119 mg/dL — ABNORMAL HIGH (ref 70–99)
Potassium: 4.7 mmol/L (ref 3.5–5.2)
Sodium: 141 mmol/L (ref 134–144)
Total Protein: 7.3 g/dL (ref 6.0–8.5)
eGFR: 73 mL/min/{1.73_m2} (ref >59)

## 2024-04-07 LAB — HGB A1C W/O EAG: Hgb A1c MFr Bld: 6.8 % — ABNORMAL HIGH (ref 4.8–5.6)

## 2024-04-07 LAB — VITAMIN B12: Vitamin B-12: 1103 pg/mL (ref 232–1245)

## 2024-04-07 LAB — CK: Total CK: 1142 U/L (ref 41–331)

## 2024-04-07 LAB — SPECIMEN STATUS REPORT

## 2024-04-09 ENCOUNTER — Ambulatory Visit: Payer: Self-pay | Admitting: Neurology

## 2024-04-10 ENCOUNTER — Ambulatory Visit: Payer: Self-pay | Admitting: Family Medicine

## 2024-04-13 ENCOUNTER — Other Ambulatory Visit: Payer: Self-pay | Admitting: Cardiology

## 2024-04-13 DIAGNOSIS — I480 Paroxysmal atrial fibrillation: Secondary | ICD-10-CM

## 2024-04-16 ENCOUNTER — Ambulatory Visit: Admitting: Family Medicine

## 2024-04-16 ENCOUNTER — Telehealth: Payer: Self-pay

## 2024-04-16 ENCOUNTER — Other Ambulatory Visit (HOSPITAL_COMMUNITY): Payer: Self-pay

## 2024-04-16 ENCOUNTER — Other Ambulatory Visit: Payer: Self-pay

## 2024-04-16 VITALS — BP 106/64 | HR 62 | Ht 69.0 in | Wt 191.0 lb

## 2024-04-16 DIAGNOSIS — R29898 Other symptoms and signs involving the musculoskeletal system: Secondary | ICD-10-CM

## 2024-04-16 DIAGNOSIS — M5416 Radiculopathy, lumbar region: Secondary | ICD-10-CM | POA: Diagnosis not present

## 2024-04-16 MED ORDER — GABAPENTIN 300 MG PO CAPS: 300.0000 mg | ORAL_CAPSULE | Freq: Three times a day (TID) | ORAL | 3 refills | Status: AC | PRN

## 2024-04-16 MED ORDER — PREDNISONE 50 MG PO TABS: 50.0000 mg | ORAL_TABLET | Freq: Every day | ORAL | 0 refills | Status: DC

## 2024-04-16 MED ORDER — APIXABAN 5 MG PO TABS
5.0000 mg | ORAL_TABLET | Freq: Two times a day (BID) | ORAL | 1 refills | Status: DC
  Filled 2024-04-16: qty 180, 90d supply, fill #0
  Filled 2024-07-12: qty 180, 90d supply, fill #1

## 2024-04-16 NOTE — Telephone Encounter (Signed)
 Pre cert has been approved

## 2024-04-16 NOTE — Telephone Encounter (Signed)
 Eliquis  5mg  refill request received. Patient is 73 years old, weight-84.8kg, Crea-1.09 on 04/06/24, Diagnosis-Afib, and last seen by Dr. Ladona on 03/02/24. Dose is appropriate based on dosing criteria. Will send in refill to requested pharmacy.

## 2024-04-16 NOTE — Patient Instructions (Signed)
 Thank you for coming in today.   I've sent a prescription for Prednisone  & Gabapentin  to your pharmacy.   Please get an Xray today at [facility] should hear from MRI scheduling within 1 week. If you do not hear please let me know.

## 2024-04-16 NOTE — Telephone Encounter (Signed)
 STAT l-spine MRI ordered to [place]

## 2024-04-16 NOTE — Progress Notes (Signed)
   LILLETTE Ileana Collet, PhD, AT, acting as a scribe for Artist Lloyd, MD.  Stanley Boyd is a 73 y.o. male who presents to [facility] at [facility] today for LBP. Pt was last seen by Dr. Lloyd on 09/23/22 for R shoulder pain.  Today, patient reports LBP ongoing since his returning from his trip out [place], around June 13th. His wife notes they were doing a lot of hiking, walking, and long car rides during the trip. Hx of lumbar spine surgery. Pain started in his L knee, but now he locates pain to the lateral aspect of his L lower leg. Slight LBP. He does note weakness left leg.  Pain is severe.  Radiating pain: yes LE numbness/tingling: no, leg feels like it's on fire LE weakness: hx of nerve damage Aggravates: too much pain Treatments tried: muscle relaxer. Tylenol ,    Diagnostic Imaging: 08/14/20 - XR R Knee  Pertinent review of systems: No fevers or chills  Relevant historical information: Atrial fibrillation and diabetes.  Optic neuritis.   Exam:  BP 106/64   Pulse 62   Ht 5' 9 (1.753 m)   Wt 191 lb (86.6 kg)   SpO2 98%   BMI 28.21 kg/m  General: Well Developed, well nourished, and in no acute distress.   MSK: Spine: Decreased lumbar motion. Lower extremity strength decreased 4/5 left foot dorsiflexion Reflexes are intact.   Lab and Radiology Results  X-ray images lumbar spine ordered today will be completed at [facility] closer to home.    Assessment and Plan: 73 y.o. male with severe left lumbar radiculopathy at left L5.  Patient has weakness with left foot dorsiflexion.  Plan for x-ray lumbar spine and MRI lumbar spine.  Will try to get the MRI done stat.  Will prescribe prednisone  and gabapentin  for temporary symptom control.  Anticipate epidural steroid injection or surgery consultation based on results of MRI.   PDMP not reviewed this encounter. Orders Placed This Encounter  Procedures   DG Lumbar Spine 2-3 Views    Standing  Status:   Future    Expiration Date:   05/17/2024    Reason for Exam (SYMPTOM  OR DIAGNOSIS REQUIRED):   lumbar radiculopathy    Preferred imaging location?:   [facility]   MR LUMBAR SPINE WO CONTRAST    Standing Status:   Future    Expiration Date:   05/17/2024    What is the patient's sedation requirement?:   No Sedation    Does the patient have a pacemaker or implanted devices?:   No    Preferred imaging location?:   [facility] (table limit-500lbs)   Meds ordered this encounter  Medications   predniSONE  (DELTASONE ) 50 MG tablet    Sig: Take 1 tablet (50 mg total) by mouth daily.    Dispense:  5 tablet    Refill:  0   gabapentin  (NEURONTIN ) 300 MG capsule    Sig: Take 1 capsule (300 mg total) by mouth 3 (three) times daily as needed.    Dispense:  90 capsule    Refill:  3     Discussed warning signs or symptoms. Please see discharge instructions. Patient expresses understanding.   The above documentation has been reviewed and is accurate and complete Artist Lloyd, M.D.

## 2024-04-17 ENCOUNTER — Ambulatory Visit (INDEPENDENT_AMBULATORY_CARE_PROVIDER_SITE_OTHER)

## 2024-04-17 ENCOUNTER — Ambulatory Visit: Payer: Self-pay | Admitting: Family Medicine

## 2024-04-17 ENCOUNTER — Other Ambulatory Visit

## 2024-04-17 DIAGNOSIS — M5416 Radiculopathy, lumbar region: Secondary | ICD-10-CM | POA: Diagnosis not present

## 2024-04-17 DIAGNOSIS — R29898 Other symptoms and signs involving the musculoskeletal system: Secondary | ICD-10-CM

## 2024-04-17 DIAGNOSIS — I7143 Infrarenal abdominal aortic aneurysm, without rupture: Secondary | ICD-10-CM

## 2024-04-17 NOTE — Progress Notes (Signed)
 MRI does show a pinched nerve causing the left leg pain.  I have ordered a back injection.  You should hear soon about scheduling.  If not better or if worsening abruptly let me know and I will arrange for a neurosurgery evaluation.

## 2024-04-18 ENCOUNTER — Telehealth: Payer: Self-pay

## 2024-04-18 ENCOUNTER — Encounter: Payer: Self-pay | Admitting: Cardiology

## 2024-04-18 DIAGNOSIS — I7143 Infrarenal abdominal aortic aneurysm, without rupture: Secondary | ICD-10-CM | POA: Insufficient documentation

## 2024-04-18 NOTE — Progress Notes (Signed)
 X-ray images lumbar spine shows medium arthritis in the low back.  You also do have an aortic aneurysm on x-ray.  I will order an ultrasound of your abdominal aorta to evaluate that more thoroughly.

## 2024-04-18 NOTE — Telephone Encounter (Signed)
 Patient's wife called back for results.  Can you please call her when available.  (DPR on file)

## 2024-04-18 NOTE — Telephone Encounter (Signed)
   Pre-operative Risk Assessment    Patient Name: Stanley Boyd  DOB: [birth year] MRN: [id number]   Date of last office visit: 03/02/24 GORDY BERGAMO, MD Date of next office visit: NONE   Request for Surgical Clearance    Procedure:  LUMBAR EPIDURAL  Date of Surgery:  Clearance TBD                                Surgeon:  NOT INDICATED Surgeon's Group or Practice Name:  [facility] Phone number:  [phone] Fax number:  [phone]   Type of Clearance Requested:   - Medical  - Pharmacy:  Hold Apixaban  (Eliquis ) 4 DOSES BEFORE PROCEDURE   Type of Anesthesia:  Not Indicated   Additional requests/questions:    Signed, Lucie DELENA Ku   04/18/2024, 4:15 PM

## 2024-04-18 NOTE — Progress Notes (Signed)
[place] 

## 2024-04-19 ENCOUNTER — Other Ambulatory Visit: Payer: Self-pay

## 2024-04-19 ENCOUNTER — Telehealth: Payer: Self-pay

## 2024-04-19 ENCOUNTER — Other Ambulatory Visit: Payer: Self-pay | Admitting: Cardiology

## 2024-04-19 ENCOUNTER — Ambulatory Visit (HOSPITAL_COMMUNITY)
Admission: RE | Admit: 2024-04-19 | Discharge: 2024-04-19 | Disposition: A | Discharge status: Home / Self Care | Source: Ambulatory Visit | Attending: Family Medicine | Admitting: Family Medicine

## 2024-04-19 ENCOUNTER — Encounter: Payer: Self-pay | Admitting: Family Medicine

## 2024-04-19 DIAGNOSIS — I7143 Infrarenal abdominal aortic aneurysm, without rupture: Secondary | ICD-10-CM | POA: Insufficient documentation

## 2024-04-19 DIAGNOSIS — I714 Abdominal aortic aneurysm, without rupture, unspecified: Secondary | ICD-10-CM

## 2024-04-19 NOTE — Progress Notes (Signed)
 ICD-10-CM   1. Infrarenal abdominal aortic aneurysm (AAA) without rupture (HCC)  I71.43 CT ANGIO ABDOMEN PELVIS  W & WO CONTRAST     Orders Placed This Encounter  Procedures   CT ANGIO ABDOMEN PELVIS  W & WO CONTRAST    Standing Status:   Future    Expected Date:   04/20/2024    Expiration Date:   04/19/2025    If indicated for the ordered procedure, I authorize the administration of contrast media per Radiology protocol:   Yes    Initiate TAVR/CTA/Valve Evaluation Adult Protocol:   Yes    Does the patient have a contrast media/X-ray dye allergy?:   No    [facility]

## 2024-04-19 NOTE — Telephone Encounter (Signed)
 Left VM to have pt/wife call us  back to discuss vasc US  findings and plan for CT

## 2024-04-19 NOTE — Telephone Encounter (Signed)
 Informed of abnormal finding by Dr. Joane. Ordered CT aorta-spoke with patient's wife-they are in [place] and hoping to get this done before leaving town if possible-I have reached out to my team to contact the stat team

## 2024-04-19 NOTE — Telephone Encounter (Signed)
 Done

## 2024-04-19 NOTE — Telephone Encounter (Signed)
 Patient with diagnosis of Afib on Eliquis  for anticoagulation.    Procedure: LUMBAR EPIDURAL  Date of procedure: TBD   CHA2DS2-VASc Score = 3   This indicates a 3.2% annual risk of stroke. The patient's score is based upon: CHF History: 0 HTN History: 1 Diabetes History: 0 Stroke History: 0 Vascular Disease History: 1 Age Score: 1 Gender Score: 0  CrCl 68 mL/min Platelet count 244   Per office protocol, patient can hold Eliquis  for 3 days prior to procedure.   Patient will not need bridging with Lovenox  (enoxaparin ) around procedure.  **This guidance is not considered finalized until pre-operative APP has relayed final recommendations.**

## 2024-04-19 NOTE — Telephone Encounter (Signed)
 [facility] called w/ urgent findings from vasc US . See report.

## 2024-04-19 NOTE — Addendum Note (Signed)
 Addended by: KATRINKA GARNETTE KIDD on: 04/19/2024 10:55 AM   Modules accepted: Orders

## 2024-04-20 ENCOUNTER — Ambulatory Visit (HOSPITAL_COMMUNITY)
Admission: RE | Admit: 2024-04-20 | Discharge: 2024-04-20 | Disposition: A | Discharge status: Home / Self Care | Source: Ambulatory Visit | Attending: Cardiology | Admitting: Cardiology

## 2024-04-20 ENCOUNTER — Ambulatory Visit: Payer: Self-pay | Admitting: Cardiology

## 2024-04-20 ENCOUNTER — Encounter: Payer: Self-pay | Admitting: Family Medicine

## 2024-04-20 ENCOUNTER — Ambulatory Visit: Admitting: Family Medicine

## 2024-04-20 ENCOUNTER — Ambulatory Visit: Payer: Self-pay | Admitting: Family Medicine

## 2024-04-20 VITALS — BP 122/70 | HR 81 | Temp 98.0°F | Ht 69.0 in | Wt 191.0 lb

## 2024-04-20 DIAGNOSIS — N400 Enlarged prostate without lower urinary tract symptoms: Secondary | ICD-10-CM | POA: Insufficient documentation

## 2024-04-20 DIAGNOSIS — I7143 Infrarenal abdominal aortic aneurysm, without rupture: Secondary | ICD-10-CM | POA: Diagnosis present

## 2024-04-20 DIAGNOSIS — E119 Type 2 diabetes mellitus without complications: Secondary | ICD-10-CM | POA: Diagnosis not present

## 2024-04-20 DIAGNOSIS — K4090 Unilateral inguinal hernia, without obstruction or gangrene, not specified as recurrent: Secondary | ICD-10-CM | POA: Insufficient documentation

## 2024-04-20 DIAGNOSIS — G7249 Other inflammatory and immune myopathies, not elsewhere classified: Secondary | ICD-10-CM | POA: Diagnosis not present

## 2024-04-20 DIAGNOSIS — I700 Atherosclerosis of aorta: Secondary | ICD-10-CM | POA: Diagnosis not present

## 2024-04-20 DIAGNOSIS — E785 Hyperlipidemia, unspecified: Secondary | ICD-10-CM

## 2024-04-20 DIAGNOSIS — Z7984 Long term (current) use of oral hypoglycemic drugs: Secondary | ICD-10-CM

## 2024-04-20 MED ORDER — IOHEXOL 350 MG/ML SOLN
75.0000 mL | Freq: Once | INTRAVENOUS | Status: AC | PRN
  Administered 2024-04-20: 75 mL via INTRAVENOUS

## 2024-04-20 NOTE — Progress Notes (Addendum)
 Phone [phone] In person visit   Subjective:   Stanley Boyd is a 73 y.o. 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    Priority: High   CAD (coronary artery disease), native coronary artery 07/13/2015    Priority: High   S/P PTCA (percutaneous transluminal coronary angioplasty) 07/08/2015    Priority: High   Elevated PSA 02/25/2017    Priority: Medium    Hemiparesis (HCC) 09/18/2014    Priority: Medium    BPH (benign prostatic hyperplasia) 09/18/2014    Priority: Medium    Hyperlipidemia     Priority: Medium    Bradycardia     Priority: Medium    Diarrhea 07/13/2023    Priority: Low   Polyp of sigmoid colon     Priority: Low   B12 deficiency 02/26/2019    Priority: Low   History of colonic polyps 10/15/2014    Priority: Low   Transaminitis 09/18/2014    Priority: Low   Infrarenal abdominal aortic aneurysm (AAA) without rupture (HCC) 04/18/2024    Medications- reviewed and updated Current Outpatient Medications  Medication Sig Dispense Refill   apixaban  (ELIQUIS ) 5 MG TABS tablet Take 1 tablet (5 mg total) by mouth 2 (two) times daily. 180 tablet 1   Carboxymethylcellulose Sodium (REFRESH TEARS OP) Place 1 drop into both eyes daily as needed (dry eyes).     empagliflozin  (JARDIANCE ) 25 MG TABS tablet Take 1 tablet (25 mg total) by mouth daily before breakfast. 90 tablet 3   Evolocumab  (REPATHA  SURECLICK) 140 MG/ML SOAJ Inject 140 mg into the skin every 14 (fourteen) days. 6 mL 3   finasteride  (PROSCAR ) 5 MG  tablet Take 1 tablet (5 mg total) by mouth daily. 90 tablet 3   gabapentin  (NEURONTIN ) 300 MG capsule Take 1 capsule (300 mg total) by mouth 3 (three) times daily as needed. 90 capsule 3   metFORMIN  (GLUCOPHAGE -XR) 500 MG 24 hr tablet Take 1 tablet (500 mg total) by mouth 2 (two) times daily with a meal. 180 tablet 3   mycophenolate  (CELLCEPT ) 500 MG tablet Take 2 tablets (1,000 mg total) by mouth in the morning AND 3 tablets (1,500 mg total) every evening. 450 tablet 3   nitroGLYCERIN  (NITROSTAT ) 0.4 MG SL tablet Place 1 tablet (0.4 mg total) under the tongue every 5 (five) minutes as needed for chest pain. 25 tablet 4   pantoprazole  (PROTONIX ) 20 MG tablet Take 1 tablet (20 mg total) by mouth daily. 90 tablet 3   predniSONE  (DELTASONE ) 5 MG tablet Take 1 tablet (5 mg total) by mouth daily with breakfast. 90 tablet 1   sildenafil  (VIAGRA ) 100 MG tablet Take 1 tablet (100 mg total) by mouth daily as needed for erectile dysfunction. 10 tablet 11   tamsulosin  (FLOMAX ) 0.4 MG CAPS capsule Take 1 capsule (0.4 mg total) by mouth daily. 90 capsule 3   tiZANidine  (ZANAFLEX ) 4 MG tablet Take 4 mg by mouth every 6 (six) hours as needed for muscle spasms.     VASCEPA  1 g capsule Take 2  capsules (2 g total) by mouth 2 (two) times daily. 120 capsule 9   predniSONE  (DELTASONE ) 50 MG tablet Take 1 tablet (50 mg total) by mouth daily. 5 tablet 0   No current facility-administered medications for this visit.     Objective:  BP 122/70   Pulse 81   Temp 98 F (36.7 C)   Ht 5' 9 (1.753 m)   Wt 191 lb (86.6 kg)   SpO2 97%   BMI 28.21 kg/m  Gen: NAD, resting comfortably CV: RRR no murmurs rubs or gallops Lungs: CTAB no crackles, wheeze, rhonchi Ext: trace edema Skin: warm, dry     Assessment and Plan   #back pain- working with Dr. Joane and may need steroid injection-I think his A1c can tolerate this  # abdominal aortic aneurysm  S: noted on ultrasound with concern for dissection. Dr. Magdaleno team  was able to get stat imaging  Aortic atherosclerosis resulting in moderate to severe stenosis of the infrarenal abdominal aorta immediately upstream of a fusiform aneurysm measuring 3.3 x 3.0 cm.  A/P: no obvious dissection- discussed referral to vascular surgery and patient and family on board- continue to control blood pressure- that's not been an issue for him thankfully and since it seems there is relation to atherosclerosis- remain on the Repatha  -he knows to seek care emergently if abdominal pain or owrsening back pain  Addendum: Discussed with Dr. Ladona through secure messaging and he is speaking with patient and wife-he plans to follow them up in the office and can complete stenting if needed in the future but at this point is simply routine follow-up with no dissection  # 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 Tobie RAMAN: Medication: CellCept  1000 mg in am and 1500mg  in pm, prednisone  wean per Dr. Tobie- off in July 2024 but had to restart in 2025 before coming back off with CK level creeping up- remains off prednisone  other than recent dosing form Dr. Joane for back -prior  IVIG  A/P: seems to be stable but trying to find the new CK baseline that is tolerable- thankfully no baseline.     # Diabetes- started out as steroid induced hyperglycemia on long term prednisone  S: Medication:jardiance  10 mg --> 25 mg, metformin  XR twice daily - diarrhea on metformin  1000 mg IR twice daily  in past  Lab Results  Component Value Date   HGBA1C 6.8 (H) 04/06/2024   HGBA1C 7.7 (H) 12/09/2023   HGBA1C 6.3 (H) 06/01/2023  A/P: improved/at goal- continue current medications   -check microalbuminuria next visit  # Atrial fibrillation-follows with Dr. Ladona- related to cataract surgery S: Rate controlled with no medication  Anticoagulated with Eliquis  5 mg twice daily  A/P:  appropriately anticoagulated and rate controlled- continue current medicine     # CAD-follows with Dr. Ladona   #hyperlipidemia S: Medication:Eliquis  5 mg twice daily (off aspirin ), Vascepa  2g twice daily , Repatha  140 mg every 2 weeks  - History of mild LFT elevations   at times -symptoms:  no chest pain or shortness of breath or abnormal fatigue (his typical presenting symptom) Lab Results  Component Value Date   CHOL 134 06/01/2023   HDL 40 06/01/2023   LDLCALC 52 06/01/2023   LDLDIRECT 58 02/26/2020   TRIG 269 (H) 06/01/2023   CHOLHDL 2 01/25/2022   A/P: CAD  asymptomatic continue current medications   Hyperlipidemia - at goal other than triglyceride(s)- hopefully improved next check   Recommended follow up: Return in about 4 months (  around 08/21/2024) for physical or sooner if needed.Schedule b4 you leave. Future Appointments  Date Time Provider Department Center  05/01/2024  3:30 PM Patel, Donika K, DO [facility] None  09/03/2024 11:20 AM [facility] ANNUAL WELLNESS VISIT 1 [facility]   Lab/Order associations:   ICD-10-CM   1. Infrarenal abdominal aortic aneurysm (AAA) without rupture [facility])  I71.43 Ambulatory referral to Vascular Surgery    2. Autoimmune necrotizing myopathy  G72.49 CK (Creatine Kinase)    3. Diabetes mellitus without complication (HCC)  E11.9 Comprehensive metabolic panel with GFR    CBC with Differential/Platelet    Lipid panel    Microalbumin / creatinine urine ratio    4. Hyperlipidemia, unspecified hyperlipidemia type  E78.5 Comprehensive metabolic panel with GFR    CBC with Differential/Platelet    Lipid panel      No orders of the defined types were placed in this encounter.   Return precautions advised.  Garnette Lukes, MD

## 2024-04-20 NOTE — Patient Instructions (Addendum)
 We have placed a referral for you today to vascular surgeon- please call their # if you do not hear within a week (may be listed below or you may see mychart message within a few days with #).   Team print labs please- also please on top copy stamp our address for him  Recommended follow up: Return in about 4 months (around 08/21/2024) for physical or sooner if needed.Schedule b4 you leave.

## 2024-04-20 NOTE — Addendum Note (Signed)
 Addended by: KATRINKA GARNETTE KIDD on: 04/20/2024 05:18 PM   Modules accepted: Orders

## 2024-04-20 NOTE — Progress Notes (Signed)
 Patient is aware of the results of the CTA, his PCP has also spoken to the patient.  Please arrange for him to come and see me electively, no urgency.  He may need peripheral arteriogram and possible angioplasty as he is having symptoms of claudication which was masked by spinal stenosis as well.

## 2024-04-22 ENCOUNTER — Encounter: Payer: Self-pay | Admitting: Family Medicine

## 2024-04-23 ENCOUNTER — Encounter: Payer: Self-pay | Admitting: Cardiology

## 2024-04-23 NOTE — Telephone Encounter (Signed)
   Patient Name: Stanley Boyd  DOB: 09-Oct-1951 MRN: [id number]  Primary Cardiologist: Gordy Bergamo, MD  Chart reviewed as part of pre-operative protocol coverage.  Patient has since sent MyChart message asking for update. I reviewed with Dr. Verlin (DOD) since Dr. Bergamo was out of the office, specifically inquiring if CT c/a/p findings precluded holding of Eliquis . No contraindication to proceeding identified. The patient would be at acceptable risk for the planned procedure without further cardiovascular testing.   Per office protocol, patient can hold Eliquis  for 3 days prior to procedure.   Patient will not need bridging with Lovenox  (enoxaparin ) around procedure.  I did relay this update to the patient in a reply in the MyChart message.  Will route this bundled recommendation to requesting provider via Epic fax function. Please call with questions.   Fatim Vanderschaaf N Ainslie Mazurek, PA-C 04/23/2024, 2:41 PM

## 2024-04-23 NOTE — Telephone Encounter (Signed)
 I placed call to patient to schedule appointment with Dr Ladona.  Left message to call office

## 2024-04-23 NOTE — Telephone Encounter (Signed)
 Will update original phone note to reflect no additional needs from Dr. Ganji

## 2024-04-23 NOTE — Telephone Encounter (Addendum)
 Helping in preop today. Previous APP routed to Dr. Ladona for input, awaiting reply as below.

## 2024-05-01 ENCOUNTER — Encounter: Payer: Self-pay | Admitting: Neurology

## 2024-05-01 ENCOUNTER — Ambulatory Visit: Admitting: Neurology

## 2024-05-01 VITALS — BP 130/69 | HR 53 | Ht 69.0 in | Wt 186.0 lb

## 2024-05-01 DIAGNOSIS — M5116 Intervertebral disc disorders with radiculopathy, lumbar region: Secondary | ICD-10-CM

## 2024-05-01 DIAGNOSIS — Z5181 Encounter for therapeutic drug level monitoring: Secondary | ICD-10-CM

## 2024-05-01 DIAGNOSIS — G7249 Other inflammatory and immune myopathies, not elsewhere classified: Secondary | ICD-10-CM | POA: Diagnosis not present

## 2024-05-01 NOTE — Progress Notes (Signed)
 Follow-up Visit   Date: 05/01/24   Domenic Schoenberger MRN: [id number] DOB: 1950-12-24    Stanley Boyd is a 73 y.o. 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  The patient was accompanied to the clinic by self.   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  His exam is stable without weakness.  He has new left leg weakness (subjective) due to lumbar disc herniation.  CK is stable ~ 1100.  No significant change in CK after starting low dose prednisone  5mg  in March 2025. Previously managed on a long course of IVIG (discontinued in 01/2021), prednisone  (stopped in August 2024), and Cellcept  (increased to 1000mg  BID in Feb 2022 and again to 2500mg /d in Dec 2024) Continue to monitor off immunosuppression medication Check CK in 3 months  2.  Bilateral optic neuropathy, stable.  Previously evaluated by [facility]  3.  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  He will be getting ESI later this week and followed by Sports Medicine.  Return to clinic in 3 months  ------------------------------------------------------------------------ Interim History: He is here for follow-up visit.  He has been doing great and tells me that he has felt the best in a long time over the past 4 months.  He has been able to reduce prednisone  to 2.5mg , which he has been on for the past month.  No new weakness.  CK continues to fluctuate ~ 600-700.   UPDATE 10/11/2023:  He is here for follow-up visit.  He has been off prednisone  since ~ August after a very long taper.  His most recent CK was elevated from 700 > 900.  He denies any weakness.  If anything, he has noticed that it is harder for him to get up off the  floor, but feels this may more age-related than true weakness.  He is physically very active and has not noticed any new changes with his endurance or activity level.  No interval falls or illnesses.   UPDATE 12/28/2023:  He is here for follow-up visit.  CK has been rising over the past few months 704 > 915 >1298.  He has been feeling well and denies any new weakness.  He is still able to continue the same level of physical activity. No new cramps.  UPDATE 05/01/2024:  He is here for follow-up visit. He has been having increased low back pain [place]  He developed low back pain radiating into the left leg with associated numbness.  MRI lumbar spine shows moderate to severe impingement at L4-5 with disc fragment extended into the left lateral recess from L4-5 impinging on left L5 nerve root. He has some left leg weakness.  He was given prednisone  course for pain and will be having ESI later this week.  No arm or right leg weakness.  His CK remains ~1100.    Medications:  Current Outpatient Medications on File Prior to Visit  Medication Sig Dispense Refill   apixaban  (ELIQUIS ) 5 MG TABS tablet Take 1 tablet (5 mg total) by mouth 2 (two) times daily. (Patient taking differently: Take 5 mg by mouth 2 (two) times daily. On hold until Thursday 05/03/24 due to spinal shots) 180 tablet 1   Carboxymethylcellulose Sodium (REFRESH TEARS OP) Place 1 drop into both eyes daily  as needed (dry eyes).     empagliflozin  (JARDIANCE ) 25 MG TABS tablet Take 1 tablet (25 mg total) by mouth daily before breakfast. 90 tablet 3   Evolocumab  (REPATHA  SURECLICK) 140 MG/ML SOAJ Inject 140 mg into the skin every 14 (fourteen) days. 6 mL 3   finasteride  (PROSCAR ) 5 MG tablet Take 1 tablet (5 mg total) by mouth daily. 90 tablet 3   gabapentin  (NEURONTIN ) 300 MG capsule Take 1 capsule (300 mg total) by mouth 3 (three) times daily as needed. 90 capsule 3   metFORMIN  (GLUCOPHAGE -XR) 500 MG 24 hr tablet Take 1  tablet (500 mg total) by mouth 2 (two) times daily with a meal. 180 tablet 3   mycophenolate  (CELLCEPT ) 500 MG tablet Take 2 tablets (1,000 mg total) by mouth in the morning AND 3 tablets (1,500 mg total) every evening. 450 tablet 3   nitroGLYCERIN  (NITROSTAT ) 0.4 MG SL tablet Place 1 tablet (0.4 mg total) under the tongue every 5 (five) minutes as needed for chest pain. 25 tablet 4   pantoprazole  (PROTONIX ) 20 MG tablet Take 1 tablet (20 mg total) by mouth daily. 90 tablet 3   sildenafil  (VIAGRA ) 100 MG tablet Take 1 tablet (100 mg total) by mouth daily as needed for erectile dysfunction. 10 tablet 11   tamsulosin  (FLOMAX ) 0.4 MG CAPS capsule Take 1 capsule (0.4 mg total) by mouth daily. 90 capsule 3   tiZANidine  (ZANAFLEX ) 4 MG tablet Take 4 mg by mouth every 6 (six) hours as needed for muscle spasms.     VASCEPA  1 g capsule Take 2 capsules (2 g total) by mouth 2 (two) times daily. 120 capsule 9   No current facility-administered medications on file prior to visit.    Allergies:  Allergies  Allergen Reactions   Covid-19 Mrna Vacc (Moderna) Other (See Comments)    Optic neuropathy after original 2 vaccines    Statins Other (See Comments)    Myositis    Vital Signs:  BP 130/69   Pulse (!) 53   Ht 5' 9 (1.753 m)   Wt 186 lb (84.4 kg)   SpO2 100%   BMI 27.47 kg/m   Neurological Exam: MENTAL STATUS including orientation to time, place, person, recent and remote memory, attention span and concentration, language, and fund of knowledge is normal.  Speech is not dysarthric.  CRANIAL NERVES: Normal conjugate, extra-ocular eye movements in all directions of gaze.  No ptosis bilaterally.  Face is symmetric.   MOTOR:  Motor strength is 5/5 throughout, except R hand is 17/5 (old from stroke). No atrophy, fasciculations or abnormal movements.  No pronator drift.   REFLEXES:  Reflexes are 2+/4 throughout, except 3+/4 at the knees bilaterally.   SENSATION:  Vibration and temperature is  intact throughout.   COORDINATION/GAIT:   Gait is normal.  Data: MRI Lumbar spine wo contrast 04/17/2024: 1. Lumbar spondylosis and degenerative disc disease, causing prominent impingement at L5-S1; moderate to prominent impingement at L4-5; and moderate impingement at L3-4. Special note is made of ectopic disc material (disc extrusion versus disc fragment) extending caudad in the left lateral recess from L4-5, impinging on the left L5 nerve roots.  MRI cervical spine without contrast 02/17/2019: 1. 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 2. Solid anterior fusion at C4-5 and C5-6 without spinal stenosis at these levels. 3. Adjacent segment degenerative changes with moderate spinal stenosis at C6-7, mild-to-moderate spinal stenosis at C3-4, and mild-to-moderate neural foraminal stenosis at both levels.   MRI lumbar spine 02/17/2019:  1. Relatively diffuse lumbar disc and facet degeneration without significant spinal stenosis. 2. Multilevel neural foraminal stenosis, moderate on the left at L5-S1. 3. Normal appearance of the conus medullaris and cauda equina.   MRI thoracic spine without contrast 02/17/2019: 1. Mild thoracic disc and facet degeneration without evidence of neural impingement. 2. Minimal prominence of the central canal of the mid to lower thoracic spinal cord without syrinx.  Left rectus femorus muscle biopsy 02/20/2019:  Active necrotizing inflammatory myopathy. Right chest wall skin biopsy:  Benign skin with fibrosis and mixed superficial and deep inflammation  Neuromuscular Lab - [facility] 03/20/2019:  Myopathy 2 panel - HMGCR 36,000 (normal <2500)   CK Total  Latest Ref Rng 41 - 331 U/L  10/07/2022 677 (HH)   12/10/2022 707 (HH)   03/14/2023 637 (HH)   06/01/2023 704 (HH)   09/30/2023 915 (HH)   12/09/2023 1,298 (HH)    Lab Results  Component Value Date   CKTOTAL 1,142 (HH) 04/06/2024   CKMB CANCELED 03/21/2020   CKMBINDEX 23.6 (HH) 04/15/2021   TROPONINI 0.03 (HH)  02/17/2019    Lab Results  Component Value Date   HGBA1C 6.8 (H) 04/06/2024     Thank you for allowing me to participate in patient's care.  If I can answer any additional questions, I would be pleased to do so.    Sincerely,    Lizann Edelman K. Tobie, DO

## 2024-05-01 NOTE — Patient Instructions (Signed)
 We will plan to recheck CK in 3 months, prior to your next visit.

## 2024-05-02 ENCOUNTER — Other Ambulatory Visit: Payer: Self-pay

## 2024-05-02 DIAGNOSIS — I7143 Infrarenal abdominal aortic aneurysm, without rupture: Secondary | ICD-10-CM

## 2024-05-02 NOTE — Discharge Instructions (Signed)

## 2024-05-03 ENCOUNTER — Other Ambulatory Visit: Payer: Self-pay | Admitting: Family Medicine

## 2024-05-03 ENCOUNTER — Ambulatory Visit
Admission: RE | Admit: 2024-05-03 | Discharge: 2024-05-03 | Disposition: A | Discharge status: Home / Self Care | Source: Ambulatory Visit | Attending: Family Medicine | Admitting: Family Medicine

## 2024-05-03 DIAGNOSIS — M5416 Radiculopathy, lumbar region: Secondary | ICD-10-CM

## 2024-05-03 MED ORDER — METHYLPREDNISOLONE ACETATE 40 MG/ML INJ SUSP (RADIOLOG
80.0000 mg | Freq: Once | INTRAMUSCULAR | Status: AC
  Administered 2024-05-03: 80 mg via EPIDURAL

## 2024-05-03 MED ORDER — IOPAMIDOL (ISOVUE-M 200) INJECTION 41%
1.0000 mL | Freq: Once | INTRAMUSCULAR | Status: AC
  Administered 2024-05-03: 1 mL via EPIDURAL

## 2024-05-14 ENCOUNTER — Other Ambulatory Visit (HOSPITAL_COMMUNITY): Payer: Self-pay

## 2024-05-15 ENCOUNTER — Other Ambulatory Visit (HOSPITAL_COMMUNITY): Payer: Self-pay

## 2024-05-17 ENCOUNTER — Other Ambulatory Visit: Payer: Self-pay
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
## 2024-05-29 ENCOUNTER — Other Ambulatory Visit: Payer: Self-pay

## 2024-05-29 ENCOUNTER — Ambulatory Visit: Attending: Vascular Surgery | Admitting: Vascular Surgery

## 2024-05-29 ENCOUNTER — Ambulatory Visit: Admitting: Cardiology

## 2024-05-29 ENCOUNTER — Encounter: Payer: Self-pay | Admitting: Vascular Surgery

## 2024-05-29 VITALS — BP 105/62 | HR 54 | Temp 97.8°F | Resp 18 | Ht 69.0 in | Wt 184.8 lb

## 2024-05-29 DIAGNOSIS — I771 Stricture of artery: Secondary | ICD-10-CM | POA: Diagnosis not present

## 2024-05-29 DIAGNOSIS — I70213 Atherosclerosis of native arteries of extremities with intermittent claudication, bilateral legs: Secondary | ICD-10-CM | POA: Diagnosis not present

## 2024-05-29 DIAGNOSIS — I7143 Infrarenal abdominal aortic aneurysm, without rupture: Secondary | ICD-10-CM

## 2024-05-29 DIAGNOSIS — I714 Abdominal aortic aneurysm, without rupture, unspecified: Secondary | ICD-10-CM | POA: Insufficient documentation

## 2024-05-29 DIAGNOSIS — I70219 Atherosclerosis of native arteries of extremities with intermittent claudication, unspecified extremity: Secondary | ICD-10-CM | POA: Insufficient documentation

## 2024-05-31 ENCOUNTER — Ambulatory Visit: Admitting: Vascular Surgery

## 2024-06-05 ENCOUNTER — Ambulatory Visit: Attending: Vascular Surgery | Admitting: Vascular Surgery

## 2024-06-05 DIAGNOSIS — Q251 Coarctation of aorta: Secondary | ICD-10-CM

## 2024-06-05 DIAGNOSIS — I70213 Atherosclerosis of native arteries of extremities with intermittent claudication, bilateral legs: Secondary | ICD-10-CM | POA: Diagnosis not present

## 2024-06-05 DIAGNOSIS — I771 Stricture of artery: Secondary | ICD-10-CM

## 2024-06-05 DIAGNOSIS — I7143 Infrarenal abdominal aortic aneurysm, without rupture: Secondary | ICD-10-CM

## 2024-06-05 NOTE — Progress Notes (Signed)
 Virtual Visit via Telephone Note    I connected with Stanley Boyd on 06/05/2024 using the [employer] by telephone and verified that I was speaking with the correct person using two identifiers. Patient was located at home and accompanied by wife. I am located at [facility].   The limitations of evaluation and management by telemedicine and the availability of in person appointments have been previously discussed with the patient and are documented in the patients chart. The patient expressed understanding and consented to proceed.  PCP: Katrinka Garnette KIDD, MD  Chief Complaint: Discuss EVAR  History of Present Illness: Stanley Boyd is a 73 y.o. male with history of atrial fibrillation, hyperlipidemia, diabetes, autoimmune necrotizing myopathy and coronary disease that presents for phone visit to discuss planned AAA repair with moderate to high-grade stenosis of the infrarenal abdominal aorta and bilateral common iliac arteries.  Past Medical History:  Diagnosis Date   AAA (abdominal aortic aneurysm) (HCC)    BPH (benign prostatic hyperplasia)    Bradycardia    40s to low 50s   Chronic back pain    multiple surgeries following fall from a deer stand   Coronary artery disease    5 stents   Headache    probably monthly (07/08/2015)   Hyperlipidemia    on injectable medication   Kidney stones    years ago; never had OR/scopes   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   Neuromuscular disorder (HCC)    Peripheral vascular disease (HCC)    Pneumonia 08/2014    Past Surgical History:  Procedure Laterality Date   ANTERIOR CERVICAL DECOMP/DISCECTOMY FUSION  73; 70; 2000   [facility]   BACK SURGERY     BIOPSY PROSTATE  2017   CARDIAC CATHETERIZATION N/A 07/08/2015   Procedure: Left Heart Cath and Coronary Angiography;  Surgeon: Gordy Bergamo, MD;  Location: [facility] INVASIVE CV LAB;  Service: Cardiovascular;  Laterality: N/A;   CARDIAC CATHETERIZATION N/A 07/17/2015   Procedure: Coronary  Stent Intervention;  Surgeon: Gordy Bergamo, MD;  Location: [facility] INVASIVE CV LAB;  Service: Cardiovascular;  Laterality: N/A;   CARDIAC CATHETERIZATION N/A 04/16/2016   Procedure: Left Heart Cath and Coronary Angiography;  Surgeon: Gordy Bergamo, MD;  Location: [facility] INVASIVE CV LAB;  Service: Cardiovascular;  Laterality: N/A;   CARDIAC CATHETERIZATION N/A 04/16/2016   Procedure: Coronary Stent Intervention;  Surgeon: Gordy Bergamo, MD;  Location: [facility] INVASIVE CV LAB;  Service: Cardiovascular;  Laterality: N/A;   CATARACT EXTRACTION  11/2021   COLONOSCOPY N/A 04/05/2014   Procedure: COLONOSCOPY;  Surgeon: Margo LITTIE Haddock, MD;  Location: [facility] ENDO SUITE;  Service: Endoscopy;  Laterality: N/A;  10:30 AM   COLONOSCOPY N/A 01/25/2020   Procedure: COLONOSCOPY;  Surgeon: Haddock Margo LITTIE, MD;  Location: [facility] ENDO SUITE;  Service: Endoscopy;  Laterality: N/A;  9:30   CORONARY ANGIOPLASTY WITH STENT PLACEMENT  07/08/2015   2 stents   CORONARY STENT PLACEMENT  04/16/2016   PTCA and stenting of the ostial LAD with implantation of a 3.0 x 12 mm resolute integrity DES, stenosis reduced from 80% to 0% with maintenance of TIMI-3 flow. Ostial OM1 in-stent restenosis reduced to less than 20% with a 2.0 x 12 mm emerge balloon at 14 atmospheric pressure   LEFT HEART CATH AND CORONARY ANGIOGRAPHY N/A 05/13/2020   Procedure: LEFT HEART CATH AND CORONARY ANGIOGRAPHY;  Surgeon: Bergamo Gordy, MD;  Location: [facility] INVASIVE CV LAB;  Service: Cardiovascular;  Laterality: N/A;   LEFT HEART CATH AND CORONARY ANGIOGRAPHY N/A  02/09/2022   Procedure: LEFT HEART CATH AND CORONARY ANGIOGRAPHY;  Surgeon: Ladona Heinz, MD;  Location: [facility] INVASIVE CV LAB;  Service: Cardiovascular;  Laterality: N/A;   LIVER BIOPSY  03/2019   LUMBAR DISC SURGERY  1984   MUSCLE BIOPSY N/A 02/20/2019   Procedure: LEFT QUADRICEP MUSCLE  BIOPSY AND PUNCH BIOPSY OF RIGHT CHEST;  Surgeon: Teresa Lonni HERO, MD;  Location: [facility] OR;  Service: General;  Laterality: N/A;   POLYPECTOMY   01/25/2020   Procedure: POLYPECTOMY;  Surgeon: Harvey Margo CROME, MD;  Location: [facility] ENDO SUITE;  Service: Endoscopy;;  sigmoid   PTCA  07/17/2015   OMI    DES   SPINE SURGERY  1984, 1994, 1995, 2000    No outpatient medications have been marked as taking for the 06/05/24 encounter (Appointment) with Gretta Lonni PARAS, MD.   12 system ROS was negative unless otherwise noted in HPI   Observations/Objective:   CTA reviewed from 04/20/2024 with moderate to severe stenosis of the infrarenal aorta just upstream to about 3.3 cm abdominal aortic aneurysm. Also has a high-grade right common iliac stenosis and moderate left common iliac stenosis   Assessment and Plan:  73 y.o. male with history of atrial fibrillation, hyperlipidemia, diabetes, autoimmune necrotizing myopathy and coronary disease that presents for phone visit to discuss planned AAA repair with moderate to high-grade stenosis of the infrarenal abdominal aorta and bilateral common iliac arteries.  I again discussed plan for bilateral transfemoral access in the operating room.  Discussed using a thoracic stent graft to repair his infrarenal aortic stenosis with small aneurysm.  Discussed that we would not normally address his aneurysm but given covered stent treatment this should treat that as well.  Would address his bilateral common iliac disease with VBX stents.  I discussed risk and benefits including risk of vessel injury and atheroembolism as well as general anesthesia along with bleeding, infection etc.  He wishes to proceed.  Scheduled for next Wednesday, 06/13/2024.  All questions answered will need to hold his Eliquis .  Follow Up Instructions:   Follow up: surgery next week   I discussed the assessment and treatment plan with the patient. The patient was provided an opportunity to ask questions and all were answered. The patient agreed with the plan and demonstrated an understanding of the instructions.   The patient was advised  to call back or seek an in-person evaluation if the symptoms worsen or if the condition fails to improve as anticipated.  I spent 10 minutes with the patient via telephone encounter including reviewing imaging.   Signed, Lonni PARAS Gretta [facility] Office: [phone]  06/05/2024, 3:53 PM\

## 2024-06-07 NOTE — Progress Notes (Signed)
 Surgical Instructions   Your procedure is scheduled on Wednesday June 13, 2024. Report to [facility] Main Entrance A at 6:30 A.M., then check in with the Admitting office. Any questions or running late day of surgery: call [phone]  Questions prior to your surgery date: call [phone], Monday-Friday, 8am-4pm. If you experience any cold or flu symptoms such as cough, fever, chills, shortness of breath, etc. between now and your scheduled surgery, please notify us  at the above number.     Remember:  Do not eat or drink after midnight the night before your surgery  Take these medicines the morning of surgery with A SIP OF WATER   finasteride  (PROSCAR )  pantoprazole  (PROTONIX )  tamsulosin  (FLOMAX )   May take these medicines IF NEEDED: Carboxymethylcellulose Sodium (REFRESH TEARS OP)  gabapentin  (NEURONTIN )  nitroGLYCERIN  (NITROSTAT ) If you have to take this medication prior to surgery, please call [phone] and report this to a nurse tiZANidine  (ZANAFLEX )    PER YOUR SURGEON'S INSTRUCTIONS, HOLD YOUR apixaban  (ELIQUIS ) 3 DAYS PRIOR TO SURGERY WITH THE LAST DOSE BEING 06/09/2024.    PER YOUR SURGEON'S INSTRUCTIONS, PLEASE START TAKING ASPIRIN  81 MG 06/10/2024.    One week prior to surgery, STOP taking any Aleve, Naproxen, Ibuprofen, Motrin, Advil, Goody's, BC's, all herbal medications, fish oil, and non-prescription vitamins.   WHAT DO I DO ABOUT MY DIABETES MEDICATION?   Do not take oral diabetes medicines (pills) the morning of surgery.        DO NOT TAKE YOUR empagliflozin  (JARDIANCE ) 72 HOURS PRIOR TO SURGERY WITH THE LAST DOSE BEING 06/09/2024.          DO NOT TAKE YOUR metFORMIN  (GLUCOPHAGE -XR)  THE MORNING OF SURGERY.    The day of surgery, do not take other diabetes injectables, including Byetta (exenatide), Bydureon (exenatide ER), Victoza (liraglutide), or Trulicity (dulaglutide).   HOW TO MANAGE YOUR DIABETES BEFORE AND AFTER SURGERY  Why is it  important to control my blood sugar before and after surgery? Improving blood sugar levels before and after surgery helps healing and can limit problems. A way of improving blood sugar control is eating a healthy diet by:  Eating less sugar and carbohydrates  Increasing activity/exercise  Talking with your doctor about reaching your blood sugar goals High blood sugars (greater than 180 mg/dL) can raise your risk of infections and slow your recovery, so you will need to focus on controlling your diabetes during the weeks before surgery. Make sure that the doctor who takes care of your diabetes knows about your planned surgery including the date and location.  How do I manage my blood sugar before surgery? Check your blood sugar at least 4 times a day, starting 2 days before surgery, to make sure that the level is not too high or low.  Check your blood sugar the morning of your surgery when you wake up and every 2 hours until you get to the Short Stay unit.  If your blood sugar is less than 70 mg/dL, you will need to treat for low blood sugar: Do not take insulin . Treat a low blood sugar (less than 70 mg/dL) with  cup of clear juice (cranberry or apple), 4 glucose tablets, OR glucose gel. Recheck blood sugar in 15 minutes after treatment (to make sure it is greater than 70 mg/dL). If your blood sugar is not greater than 70 mg/dL on recheck, call [phone] for further instructions. Report your blood sugar to the short stay nurse when you get to Short Stay.  If you are admitted to the hospital after surgery: Your blood sugar will be checked by the staff and you will probably be given insulin  after surgery (instead of oral diabetes medicines) to make sure you have good blood sugar levels. The goal for blood sugar control after surgery is 80-180 mg/dL.                      Do NOT Smoke (Tobacco/Vaping) for 24 hours prior to your procedure.  If you use a CPAP at night, you may bring your  mask/headgear for your overnight stay.   You will be asked to remove any contacts, glasses, piercing's, hearing aid's, dentures/partials prior to surgery. Please bring cases for these items if needed.    Patients discharged the day of surgery will not be allowed to drive home, and someone needs to stay with them for 24 hours.  SURGICAL WAITING ROOM VISITATION Patients may have no more than 2 support people in the waiting area - these visitors may rotate.   Pre-op nurse will coordinate an appropriate time for 1 ADULT support person, who may not rotate, to accompany patient in pre-op.  Children under the age of 80 must have an adult with them who is not the patient and must remain in the main waiting area with an adult.  If the patient needs to stay at the hospital during part of their recovery, the visitor guidelines for inpatient rooms apply.  Please refer to the [facility] website for the visitor guidelines for any additional information.   If you received a COVID test during your pre-op visit  it is requested that you wear a mask when out in public, stay away from anyone that may not be feeling well and notify your surgeon if you develop symptoms. If you have been in contact with anyone that has tested positive in the last 10 days please notify you surgeon.      Pre-operative CHG Bathing Instructions   You can play a key role in reducing the risk of infection after surgery. Your skin needs to be as free of germs as possible. You can reduce the number of germs on your skin by washing with CHG (chlorhexidine  gluconate) soap before surgery. CHG is an antiseptic soap that kills germs and continues to kill germs even after washing.   DO NOT use if you have an allergy to chlorhexidine /CHG or antibacterial soaps. If your skin becomes reddened or irritated, stop using the CHG and notify one of our RNs at [phone].              TAKE A SHOWER THE NIGHT BEFORE SURGERY AND THE DAY OF SURGERY     Please keep in mind the following:  You may shave your face before/day of surgery.  Place clean sheets on your bed the night before surgery Use a clean washcloth (not used since being washed) for each shower. DO NOT sleep with pet's night before surgery.  CHG Shower Instructions:  Wash your face and private area with normal soap. If you choose to wash your hair, wash first with your normal shampoo.  After you use shampoo/soap, rinse your hair and body thoroughly to remove shampoo/soap residue.  Turn the water  OFF and apply half the bottle of CHG soap to a CLEAN washcloth.  Apply CHG soap ONLY FROM YOUR NECK DOWN TO YOUR TOES (washing for 3-5 minutes)  DO NOT use CHG soap on face, private areas, open wounds, or sores.  Pay special attention to the area where your surgery is being performed.  If you are having back surgery, having someone wash your back for you may be helpful. Wait 2 minutes after CHG soap is applied, then you may rinse off the CHG soap.  Pat dry with a clean towel  Put on clean pajamas    Additional instructions for the day of surgery: DO NOT APPLY any lotions, deodorants or cologne.   Do not wear jewelry  Do not bring valuables to the hospital. [facility] is not responsible for valuables/personal belongings. Put on clean/comfortable clothes.  Please brush your teeth.  Ask your nurse before applying any prescription medications to the skin.

## 2024-06-08 ENCOUNTER — Encounter (HOSPITAL_COMMUNITY): Payer: Self-pay

## 2024-06-08 ENCOUNTER — Encounter (HOSPITAL_COMMUNITY)
Admission: RE | Admit: 2024-06-08 | Discharge: 2024-06-08 | Disposition: A | Discharge status: Home / Self Care | Source: Ambulatory Visit | Attending: Vascular Surgery | Admitting: Vascular Surgery

## 2024-06-08 ENCOUNTER — Other Ambulatory Visit: Payer: Self-pay

## 2024-06-08 VITALS — BP 120/57 | HR 52 | Temp 97.8°F | Resp 18 | Ht 69.0 in | Wt 187.8 lb

## 2024-06-08 DIAGNOSIS — Z7901 Long term (current) use of anticoagulants: Secondary | ICD-10-CM | POA: Diagnosis not present

## 2024-06-08 DIAGNOSIS — I708 Atherosclerosis of other arteries: Secondary | ICD-10-CM | POA: Diagnosis not present

## 2024-06-08 DIAGNOSIS — K219 Gastro-esophageal reflux disease without esophagitis: Secondary | ICD-10-CM | POA: Diagnosis not present

## 2024-06-08 DIAGNOSIS — Z981 Arthrodesis status: Secondary | ICD-10-CM | POA: Diagnosis not present

## 2024-06-08 DIAGNOSIS — I350 Nonrheumatic aortic (valve) stenosis: Secondary | ICD-10-CM | POA: Insufficient documentation

## 2024-06-08 DIAGNOSIS — I2510 Atherosclerotic heart disease of native coronary artery without angina pectoris: Secondary | ICD-10-CM | POA: Insufficient documentation

## 2024-06-08 DIAGNOSIS — I480 Paroxysmal atrial fibrillation: Secondary | ICD-10-CM | POA: Diagnosis not present

## 2024-06-08 DIAGNOSIS — Z01812 Encounter for preprocedural laboratory examination: Secondary | ICD-10-CM | POA: Insufficient documentation

## 2024-06-08 DIAGNOSIS — Z955 Presence of coronary angioplasty implant and graft: Secondary | ICD-10-CM | POA: Insufficient documentation

## 2024-06-08 DIAGNOSIS — G8191 Hemiplegia, unspecified affecting right dominant side: Secondary | ICD-10-CM | POA: Diagnosis not present

## 2024-06-08 DIAGNOSIS — G9589 Other specified diseases of spinal cord: Secondary | ICD-10-CM | POA: Insufficient documentation

## 2024-06-08 DIAGNOSIS — I7143 Infrarenal abdominal aortic aneurysm, without rupture: Secondary | ICD-10-CM | POA: Insufficient documentation

## 2024-06-08 DIAGNOSIS — E785 Hyperlipidemia, unspecified: Secondary | ICD-10-CM | POA: Diagnosis not present

## 2024-06-08 DIAGNOSIS — Z79899 Other long term (current) drug therapy: Secondary | ICD-10-CM | POA: Insufficient documentation

## 2024-06-08 DIAGNOSIS — E119 Type 2 diabetes mellitus without complications: Secondary | ICD-10-CM | POA: Diagnosis not present

## 2024-06-08 DIAGNOSIS — Z01818 Encounter for other preprocedural examination: Secondary | ICD-10-CM

## 2024-06-08 DIAGNOSIS — R001 Bradycardia, unspecified: Secondary | ICD-10-CM | POA: Insufficient documentation

## 2024-06-08 DIAGNOSIS — R7989 Other specified abnormal findings of blood chemistry: Secondary | ICD-10-CM | POA: Diagnosis not present

## 2024-06-08 HISTORY — DX: Type 2 diabetes mellitus without complications: E11.9

## 2024-06-08 LAB — GLUCOSE, CAPILLARY: Glucose-Capillary: 111 mg/dL — ABNORMAL HIGH (ref 70–99)

## 2024-06-08 LAB — COMPREHENSIVE METABOLIC PANEL WITH GFR
ALT: 58 U/L — ABNORMAL HIGH (ref 0–44)
AST: 56 U/L — ABNORMAL HIGH (ref 15–41)
Albumin: 4.2 g/dL (ref 3.5–5.0)
Alkaline Phosphatase: 58 U/L (ref 38–126)
Anion gap: 11 (ref 5–15)
BUN: 11 mg/dL (ref 8–23)
CO2: 26 mmol/L (ref 22–32)
Calcium: 9.9 mg/dL (ref 8.9–10.3)
Chloride: 103 mmol/L (ref 98–111)
Creatinine, Ser: 0.81 mg/dL (ref 0.61–1.24)
GFR, Estimated: >60 mL/min (ref >60)
Glucose, Bld: 107 mg/dL — ABNORMAL HIGH (ref 70–99)
Potassium: 4 mmol/L (ref 3.5–5.1)
Sodium: 140 mmol/L (ref 135–145)
Total Bilirubin: 0.5 mg/dL (ref 0.0–1.2)
Total Protein: 7.4 g/dL (ref 6.5–8.1)

## 2024-06-08 LAB — URINALYSIS, ROUTINE W REFLEX MICROSCOPIC
Bacteria, UA: NONE SEEN
Bilirubin Urine: NEGATIVE
Glucose, UA: >=500 mg/dL — AB
Ketones, ur: NEGATIVE mg/dL
Leukocytes,Ua: NEGATIVE
Nitrite: NEGATIVE
Protein, ur: NEGATIVE mg/dL
Specific Gravity, Urine: 1.028 (ref 1.005–1.030)
pH: 5 (ref 5.0–8.0)

## 2024-06-08 LAB — TYPE AND SCREEN
ABO/RH(D): A POS
Antibody Screen: NEGATIVE

## 2024-06-08 LAB — CBC
HCT: 44 % (ref 39.0–52.0)
Hemoglobin: 14.2 g/dL (ref 13.0–17.0)
MCH: 29.1 pg (ref 26.0–34.0)
MCHC: 32.3 g/dL (ref 30.0–36.0)
MCV: 90.2 fL (ref 80.0–100.0)
Platelets: 210 K/uL (ref 150–400)
RBC: 4.88 MIL/uL (ref 4.22–5.81)
RDW: 13.9 % (ref 11.5–15.5)
WBC: 5.9 K/uL (ref 4.0–10.5)
nRBC: 0 % (ref 0.0–0.2)

## 2024-06-08 LAB — SURGICAL PCR SCREEN
MRSA, PCR: NEGATIVE
Staphylococcus aureus: NEGATIVE

## 2024-06-08 LAB — APTT: aPTT: 30 s (ref 24–36)

## 2024-06-08 LAB — PROTIME-INR
INR: 1.1 (ref 0.8–1.2)
Prothrombin Time: 14.5 s (ref 11.4–15.2)

## 2024-06-08 NOTE — Progress Notes (Signed)
 Dr. Gaylene office made aware of UA results no new orders, per office should not delay surgery

## 2024-06-08 NOTE — Progress Notes (Signed)
 PCP - Katrinka Garnette KIDD, MD  Cardiologist - Ladona Heinz, MD  Neurology -Tobie Tonita POUR, DO     PPM/ICD - denies Device Orders - n/a Rep Notified - n/a  Chest x-ray -  EKG - 5-235-25 Stress Test - 01-18-22 ECHO - 12-29-21 Cardiac Cath - 02-10-24  Sleep Study - denies CPAP - n/a  Fasting Blood Sugar - does not check it regularly at home  Checks Blood Sugar: MD monitors A1c Last A1c on 04-07-27 at 6.8  Last dose of GLP1 agonist-   GLP1 instructions:   Blood Thinner Instructions:apixaban  (ELIQUIS ) Last dose 06-09-24 Aspirin  Instructions: To start on 06-10-24  ERAS Protcol -NPO   COVID TEST-    Anesthesia review:   Patient denies shortness of breath, fever, cough and chest pain at PAT appointment   All instructions explained to the patient, with a verbal understanding of the material. Patient agrees to go over the instructions while at home for a better understanding. Patient also instructed to self quarantine after being tested for COVID-19. The opportunity to ask questions was provided.

## 2024-06-12 NOTE — Anesthesia Preprocedure Evaluation (Signed)
 Anesthesia Evaluation  Patient identified by MRN, date of birth, ID band Patient awake    Reviewed: Allergy & Precautions, H&P , NPO status , Patient's Chart, lab work & pertinent test results  Airway Mallampati: II   Neck ROM: full    Dental   Pulmonary neg pulmonary ROS   breath sounds clear to auscultation       Cardiovascular + CAD, + Cardiac Stents and + Peripheral Vascular Disease   Rhythm:regular Rate:Normal  Cath (02/2022): normal LVEF, no significant CAD.   Neuro/Psych  Headaches  Neuromuscular disease    GI/Hepatic   Endo/Other  diabetes, Type 2    Renal/GU stones     Musculoskeletal   Abdominal   Peds  Hematology   Anesthesia Other Findings   Reproductive/Obstetrics                              Anesthesia Physical Anesthesia Plan  ASA: 3  Anesthesia Plan: General   Post-op Pain Management:    Induction: Intravenous  PONV Risk Score and Plan: 2 and Ondansetron , Dexamethasone  and Treatment may vary due to age or medical condition  Airway Management Planned: Oral ETT  Additional Equipment: Arterial line  Intra-op Plan:   Post-operative Plan: Extubation in OR  Informed Consent: I have reviewed the patients History and Physical, chart, labs and discussed the procedure including the risks, benefits and alternatives for the proposed anesthesia with the patient or authorized representative who has indicated his/her understanding and acceptance.     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  Last catheterization 02/2022 showed stents from 2016 and 2017 are patent.  Preop clearance per telephone encounter by Dayna Dunn, PA-C on 04/23/2024, Chart reviewed as part of pre-operative protocol coverage.  Patient has since sent MyChart message asking for update. I reviewed with Dr. Verlin (DOD) since Dr. Ladona was out of the office, specifically inquiring if CT c/a/p findings precluded holding of Eliquis . No contraindication to proceeding identified. The patient would be at acceptable risk for the planned procedure without further cardiovascular testing. Per office protocol, patient can hold Eliquis  for 3 days prior to procedure.  Patient will not need bridging with Lovenox  (enoxaparin ) around procedure.  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  He was seen by vascular surgeon Dr. Gretta who recommended endovascular intervention.  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  Patient reports last dose of Eliquis  06/09/2024.  Preop labs reviewed, AST and ALT very mildly elevated at 56 and 58 respectively, otherwise unremarkable.  EKG 03/02/2024: Ectopic atrial rhythm at the rate of 51 bpm, normal axis, incomplete right bundle branch block. LVH with repolarization abnormality, cannot exclude lateral ischemia.   CTA abdomen pelvis 04/20/2024: IMPRESSION: 1. Aortic atherosclerosis resulting  in moderate to severe stenosis of the infrarenal abdominal aorta immediately upstream of a fusiform aneurysm measuring 3.3 x 3.0 cm. 2. Atherosclerosis and luminal narrowing of the bilateral iliac arteries as described. 3. Markedly enlarged prostate gland. 4. Small fat-containing right inguinal hernia. 5.  Aortic Atherosclerosis (ICD10-I70.0).  Cath 02/09/2022: LV: Hemodynamics: 101/0, EDP 10 mm Hg. Ao 122/54, mean 78 mmHg.  No pressure gradient across aortic valve. Angiographic data: Normal LVEF, no significant mitral regurgitation. LM: Large vessel, smooth and normal. LAD: Large vessel.  Proximal and mid LAD 3.0 x 38 overlapped with a 3.5 x 15 mm resolute DES placed 07/08/2015 is widely patent.  The surgeon to moderate to large sized D1 and small D2. LCx: Large vessel.  Proximal circumflex-large OM1 T stenting 04/16/2016 with 3.0 x 28 and a 3.0 x 24 mm Synergy DES is widely patent.  Distal circumflex has minimal disease. RCA: Large vessel and a dominant vessel.  Ostium has a 30 to 40% stenosis which is new from previous angiography in 2021  Impression: No significant coronary disease to explain his symptoms, abnormal EKG response on stress test is false positive.  LVEF is normal.  40 mL contrast utilized.  Echocardiogram 12/29/2021: Left ventricle cavity is normal in size. Mild concentric hypertrophy of the left ventricle. Normal global wall motion. Normal LV systolic function with EF 55%. Diastolic function assessed. Absence of clear P waves on single lead EKG and A waves on mitral inflow. Consider 12 lead EKG to evaluate for atrial fibrillation.   Mild (Grade I) mitral regurgitation. Mild tricuspid regurgitation. The IVC is not well visualized. Compared to previous study in 2020, mild LVH and possible possible Afib are new findings.  )         Anesthesia Quick Evaluation

## 2024-06-12 NOTE — Progress Notes (Addendum)
 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  Last catheterization 02/2022 showed stents from 2016 and 2017 are patent.  Preop clearance per telephone encounter by Dayna Dunn, PA-C on 04/23/2024, Chart reviewed as part of pre-operative protocol coverage.  Patient has since sent MyChart message asking for update. I reviewed with Dr. Verlin (DOD) since Dr. Ladona was out of the office, specifically inquiring if CT c/a/p findings precluded holding of Eliquis . No contraindication to proceeding identified. The patient would be at acceptable risk for the planned procedure without further cardiovascular testing. Per office protocol, patient can hold Eliquis  for 3 days prior to procedure.  Patient will not need bridging with Lovenox  (enoxaparin ) around procedure.  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  He was seen by vascular surgeon Dr. Gretta who recommended endovascular intervention.   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  Patient reports last dose of Eliquis  06/09/2024.  Preop labs reviewed, AST and ALT very mildly  elevated at 56 and 58 respectively, otherwise unremarkable.  EKG 03/02/2024: Ectopic atrial rhythm at the rate of 51 bpm, normal axis, incomplete right bundle branch block. LVH with repolarization abnormality, cannot exclude lateral ischemia.   CTA abdomen pelvis 04/20/2024: IMPRESSION: 1. Aortic atherosclerosis resulting in moderate to severe stenosis of the infrarenal abdominal aorta immediately upstream of a fusiform aneurysm measuring 3.3 x 3.0 cm. 2. Atherosclerosis and luminal narrowing of the bilateral iliac arteries as described. 3. Markedly enlarged prostate gland. 4. Small fat-containing right inguinal hernia. 5.  Aortic Atherosclerosis (ICD10-I70.0).  Cath 02/09/2022: LV: Hemodynamics: 101/0, EDP 10 mm Hg. Ao 122/54, mean 78 mmHg.  No pressure gradient across aortic valve. Angiographic data: Normal LVEF, no significant mitral regurgitation. LM: Large vessel, smooth and normal. LAD: Large vessel.  Proximal and mid LAD 3.0 x 38 overlapped with a 3.5 x 15 mm resolute DES placed 07/08/2015 is widely patent.  The surgeon to moderate to large sized D1 and small D2. LCx: Large vessel.  Proximal circumflex-large OM1 T stenting 04/16/2016 with 3.0 x 28 and a 3.0 x 24 mm Synergy DES is widely patent.  Distal circumflex has minimal disease. RCA: Large vessel and a dominant vessel.  Ostium has a 30 to 40% stenosis which is new from previous angiography in 2021   Impression: No significant coronary disease to explain his symptoms, abnormal EKG response on stress test is false positive.  LVEF is normal.  40 mL contrast utilized.  Echocardiogram 12/29/2021: Left ventricle cavity is normal in size. Mild concentric hypertrophy of the left ventricle. Normal global wall motion. Normal LV systolic function with EF 55%. Diastolic function assessed. Absence of clear P waves on single lead EKG and A waves  on mitral inflow. Consider 12 lead EKG to evaluate for atrial fibrillation.   Mild (Grade I) mitral  regurgitation. Mild tricuspid regurgitation. The IVC is not well visualized. Compared to previous study in 2020, mild LVH and possible possible Afib are new findings.      Lynwood Geofm RIGGERS [facility] Short Stay Center/Anesthesiology Phone [phone] 06/12/2024 9:31 AM

## 2024-06-13 ENCOUNTER — Other Ambulatory Visit: Payer: Self-pay

## 2024-06-13 ENCOUNTER — Inpatient Hospital Stay (HOSPITAL_COMMUNITY): Payer: Self-pay | Admitting: Certified Registered Nurse Anesthetist

## 2024-06-13 ENCOUNTER — Inpatient Hospital Stay (HOSPITAL_COMMUNITY)

## 2024-06-13 ENCOUNTER — Encounter (HOSPITAL_COMMUNITY): Payer: Self-pay | Admitting: Vascular Surgery

## 2024-06-13 ENCOUNTER — Other Ambulatory Visit (HOSPITAL_COMMUNITY): Payer: Self-pay

## 2024-06-13 ENCOUNTER — Inpatient Hospital Stay (HOSPITAL_COMMUNITY)
Admission: RE | Admit: 2024-06-13 | Discharge: 2024-06-14 | DRG: 271 | Disposition: A | Discharge status: Home / Self Care | Attending: Vascular Surgery | Admitting: Vascular Surgery

## 2024-06-13 ENCOUNTER — Inpatient Hospital Stay (HOSPITAL_COMMUNITY): Payer: Self-pay | Admitting: Vascular Surgery

## 2024-06-13 ENCOUNTER — Encounter (HOSPITAL_COMMUNITY)
Admission: RE | Disposition: A | Discharge status: Home / Self Care | Payer: Self-pay | Source: Home / Self Care | Attending: Vascular Surgery

## 2024-06-13 DIAGNOSIS — G8929 Other chronic pain: Secondary | ICD-10-CM | POA: Diagnosis present

## 2024-06-13 DIAGNOSIS — I350 Nonrheumatic aortic (valve) stenosis: Secondary | ICD-10-CM | POA: Diagnosis present

## 2024-06-13 DIAGNOSIS — E1151 Type 2 diabetes mellitus with diabetic peripheral angiopathy without gangrene: Secondary | ICD-10-CM | POA: Diagnosis present

## 2024-06-13 DIAGNOSIS — I480 Paroxysmal atrial fibrillation: Secondary | ICD-10-CM | POA: Diagnosis not present

## 2024-06-13 DIAGNOSIS — Z95828 Presence of other vascular implants and grafts: Principal | ICD-10-CM

## 2024-06-13 DIAGNOSIS — M549 Dorsalgia, unspecified: Secondary | ICD-10-CM | POA: Diagnosis present

## 2024-06-13 DIAGNOSIS — N400 Enlarged prostate without lower urinary tract symptoms: Secondary | ICD-10-CM | POA: Diagnosis present

## 2024-06-13 DIAGNOSIS — I2510 Atherosclerotic heart disease of native coronary artery without angina pectoris: Secondary | ICD-10-CM | POA: Diagnosis present

## 2024-06-13 DIAGNOSIS — I7143 Infrarenal abdominal aortic aneurysm, without rupture: Secondary | ICD-10-CM | POA: Diagnosis not present

## 2024-06-13 DIAGNOSIS — I70213 Atherosclerosis of native arteries of extremities with intermittent claudication, bilateral legs: Secondary | ICD-10-CM

## 2024-06-13 DIAGNOSIS — G7281 Critical illness myopathy: Secondary | ICD-10-CM | POA: Diagnosis present

## 2024-06-13 DIAGNOSIS — I4891 Unspecified atrial fibrillation: Secondary | ICD-10-CM | POA: Diagnosis present

## 2024-06-13 DIAGNOSIS — Z887 Allergy status to serum and vaccine status: Secondary | ICD-10-CM

## 2024-06-13 DIAGNOSIS — E785 Hyperlipidemia, unspecified: Secondary | ICD-10-CM | POA: Diagnosis present

## 2024-06-13 DIAGNOSIS — Z87442 Personal history of urinary calculi: Secondary | ICD-10-CM

## 2024-06-13 DIAGNOSIS — I708 Atherosclerosis of other arteries: Secondary | ICD-10-CM | POA: Diagnosis present

## 2024-06-13 DIAGNOSIS — I700 Atherosclerosis of aorta: Secondary | ICD-10-CM

## 2024-06-13 DIAGNOSIS — Z888 Allergy status to other drugs, medicaments and biological substances status: Secondary | ICD-10-CM | POA: Diagnosis not present

## 2024-06-13 DIAGNOSIS — I714 Abdominal aortic aneurysm, without rupture, unspecified: Principal | ICD-10-CM | POA: Diagnosis present

## 2024-06-13 DIAGNOSIS — R001 Bradycardia, unspecified: Secondary | ICD-10-CM | POA: Diagnosis present

## 2024-06-13 DIAGNOSIS — Z955 Presence of coronary angioplasty implant and graft: Secondary | ICD-10-CM

## 2024-06-13 HISTORY — PX: AORTOGRAM: SHX6300

## 2024-06-13 HISTORY — PX: ULTRASOUND GUIDANCE FOR VASCULAR ACCESS: SHX6516

## 2024-06-13 HISTORY — PX: INSERTION OF ILIAC STENT: SHX6256

## 2024-06-13 HISTORY — PX: ABDOMINAL AORTIC ENDOVASCULAR STENT GRAFT: SHX5707

## 2024-06-13 LAB — BASIC METABOLIC PANEL WITH GFR
Anion gap: 10 (ref 5–15)
BUN: 10 mg/dL (ref 8–23)
CO2: 24 mmol/L (ref 22–32)
Calcium: 8.4 mg/dL — ABNORMAL LOW (ref 8.9–10.3)
Chloride: 105 mmol/L (ref 98–111)
Creatinine, Ser: 0.79 mg/dL (ref 0.61–1.24)
GFR, Estimated: >60 mL/min (ref >60)
Glucose, Bld: 151 mg/dL — ABNORMAL HIGH (ref 70–99)
Potassium: 3.6 mmol/L (ref 3.5–5.1)
Sodium: 139 mmol/L (ref 135–145)

## 2024-06-13 LAB — PROTIME-INR
INR: 1.1 (ref 0.8–1.2)
Prothrombin Time: 14.7 s (ref 11.4–15.2)

## 2024-06-13 LAB — CBC
HCT: 38.2 % — ABNORMAL LOW (ref 39.0–52.0)
Hemoglobin: 12.2 g/dL — ABNORMAL LOW (ref 13.0–17.0)
MCH: 29.1 pg (ref 26.0–34.0)
MCHC: 31.9 g/dL (ref 30.0–36.0)
MCV: 91.2 fL (ref 80.0–100.0)
Platelets: 169 K/uL (ref 150–400)
RBC: 4.19 MIL/uL — ABNORMAL LOW (ref 4.22–5.81)
RDW: 13.8 % (ref 11.5–15.5)
WBC: 4.4 K/uL (ref 4.0–10.5)
nRBC: 0 % (ref 0.0–0.2)

## 2024-06-13 LAB — POCT ACTIVATED CLOTTING TIME
Activated Clotting Time: 135 s
Activated Clotting Time: 199 s
Activated Clotting Time: 222 s
Activated Clotting Time: 233 s

## 2024-06-13 LAB — APTT: aPTT: 119 s — ABNORMAL HIGH (ref 24–36)

## 2024-06-13 LAB — GLUCOSE, CAPILLARY
Glucose-Capillary: 165 mg/dL — ABNORMAL HIGH (ref 70–99)
Glucose-Capillary: 140 mg/dL — ABNORMAL HIGH (ref 70–99)
Glucose-Capillary: 318 mg/dL — ABNORMAL HIGH (ref 70–99)

## 2024-06-13 LAB — MAGNESIUM: Magnesium: 1.6 mg/dL — ABNORMAL LOW (ref 1.7–2.4)

## 2024-06-13 LAB — ABO/RH: ABO/RH(D): A POS

## 2024-06-13 SURGERY — INSERTION, ENDOVASCULAR STENT GRAFT, AORTA, ABDOMINAL
Anesthesia: General | Site: Groin | Laterality: Bilateral

## 2024-06-13 MED ORDER — LIDOCAINE 2% (20 MG/ML) 5 ML SYRINGE
INTRAMUSCULAR | Status: AC
Start: 2024-06-13 — End: 2024-06-13
  Filled 2024-06-13: qty 5

## 2024-06-13 MED ORDER — EPHEDRINE 5 MG/ML INJ
INTRAVENOUS | Status: AC
  Filled 2024-06-13: qty 5

## 2024-06-13 MED ORDER — PROPOFOL 10 MG/ML IV BOLUS
INTRAVENOUS | Status: DC | PRN
  Administered 2024-06-13: 130 mg via INTRAVENOUS

## 2024-06-13 MED ORDER — CEFAZOLIN SODIUM-DEXTROSE 2-4 GM/100ML-% IV SOLN
2.0000 g | INTRAVENOUS | Status: AC
  Administered 2024-06-13: 2 g via INTRAVENOUS
  Filled 2024-06-13: qty 100

## 2024-06-13 MED ORDER — OXYCODONE HCL 5 MG/5ML PO SOLN: 5.0000 mg | Freq: Once | ORAL | Status: DC | PRN

## 2024-06-13 MED ORDER — PROTAMINE SULFATE 10 MG/ML IV SOLN
INTRAVENOUS | Status: DC | PRN
  Administered 2024-06-13: 40 mg via INTRAVENOUS

## 2024-06-13 MED ORDER — INSULIN ASPART 100 UNIT/ML IJ SOLN: 0.0000 [IU] | INTRAMUSCULAR | Status: DC | PRN

## 2024-06-13 MED ORDER — PROTAMINE SULFATE 10 MG/ML IV SOLN
INTRAVENOUS | Status: AC
  Filled 2024-06-13: qty 5

## 2024-06-13 MED ORDER — ICOSAPENT ETHYL 1 G PO CAPS
2.0000 g | ORAL_CAPSULE | Freq: Two times a day (BID) | ORAL | Status: DC
  Administered 2024-06-13 – 2024-06-14 (×2): 2 g via ORAL
  Filled 2024-06-13 (×2): qty 2

## 2024-06-13 MED ORDER — EMPAGLIFLOZIN 25 MG PO TABS
25.0000 mg | ORAL_TABLET | Freq: Every day | ORAL | Status: DC
  Administered 2024-06-14: 25 mg via ORAL
  Filled 2024-06-13: qty 1

## 2024-06-13 MED ORDER — PANTOPRAZOLE SODIUM 40 MG PO TBEC
40.0000 mg | DELAYED_RELEASE_TABLET | Freq: Every day | ORAL | Status: DC
  Administered 2024-06-13 – 2024-06-14 (×2): 40 mg via ORAL
  Filled 2024-06-13 (×2): qty 1

## 2024-06-13 MED ORDER — LACTATED RINGERS IV SOLN: INTRAVENOUS | Status: DC

## 2024-06-13 MED ORDER — HEPARIN SODIUM (PORCINE) 1000 UNIT/ML IJ SOLN
INTRAMUSCULAR | Status: AC
  Filled 2024-06-13: qty 10

## 2024-06-13 MED ORDER — ROCURONIUM BROMIDE 10 MG/ML (PF) SYRINGE
PREFILLED_SYRINGE | INTRAVENOUS | Status: DC | PRN
  Administered 2024-06-13: 30 mg via INTRAVENOUS
  Administered 2024-06-13: 50 mg via INTRAVENOUS

## 2024-06-13 MED ORDER — TAMSULOSIN HCL 0.4 MG PO CAPS
0.4000 mg | ORAL_CAPSULE | Freq: Every day | ORAL | Status: DC
  Administered 2024-06-13 – 2024-06-14 (×2): 0.4 mg via ORAL
  Filled 2024-06-13 (×2): qty 1

## 2024-06-13 MED ORDER — ONDANSETRON HCL 4 MG/2ML IJ SOLN: 4.0000 mg | Freq: Four times a day (QID) | INTRAMUSCULAR | Status: DC | PRN

## 2024-06-13 MED ORDER — SODIUM CHLORIDE 0.9 % IV SOLN: INTRAVENOUS | Status: DC

## 2024-06-13 MED ORDER — CLOPIDOGREL BISULFATE 300 MG PO TABS
300.0000 mg | ORAL_TABLET | Freq: Every day | ORAL | Status: AC
  Administered 2024-06-13: 300 mg via ORAL
  Filled 2024-06-13: qty 1

## 2024-06-13 MED ORDER — MYCOPHENOLATE MOFETIL 250 MG PO CAPS
1000.0000 mg | ORAL_CAPSULE | Freq: Every day | ORAL | Status: DC
  Administered 2024-06-14: 1000 mg via ORAL
  Filled 2024-06-13 (×2): qty 4

## 2024-06-13 MED ORDER — TIZANIDINE HCL 4 MG PO TABS: 4.0000 mg | ORAL_TABLET | Freq: Four times a day (QID) | ORAL | Status: DC | PRN

## 2024-06-13 MED ORDER — MIDAZOLAM HCL 2 MG/2ML IJ SOLN
INTRAMUSCULAR | Status: DC | PRN
  Administered 2024-06-13: 2 mg via INTRAVENOUS

## 2024-06-13 MED ORDER — HYDROMORPHONE HCL 1 MG/ML IJ SOLN: 0.5000 mg | INTRAMUSCULAR | Status: DC | PRN

## 2024-06-13 MED ORDER — CHLORHEXIDINE GLUCONATE 0.12 % MT SOLN
15.0000 mL | Freq: Once | OROMUCOSAL | Status: AC
  Administered 2024-06-13: 15 mL via OROMUCOSAL
  Filled 2024-06-13: qty 15

## 2024-06-13 MED ORDER — ACETAMINOPHEN 650 MG RE SUPP: 325.0000 mg | RECTAL | Status: DC | PRN

## 2024-06-13 MED ORDER — METOPROLOL TARTRATE 5 MG/5ML IV SOLN: 2.5000 mg | INTRAVENOUS | Status: DC | PRN

## 2024-06-13 MED ORDER — EPHEDRINE SULFATE-NACL 50-0.9 MG/10ML-% IV SOSY
PREFILLED_SYRINGE | INTRAVENOUS | Status: DC | PRN
  Administered 2024-06-13: 5 mg via INTRAVENOUS
  Administered 2024-06-13: 20 mg via INTRAVENOUS
  Administered 2024-06-13: 5 mg via INTRAVENOUS

## 2024-06-13 MED ORDER — DEXAMETHASONE SODIUM PHOSPHATE 10 MG/ML IJ SOLN
INTRAMUSCULAR | Status: DC | PRN
  Administered 2024-06-13: 10 mg via INTRAVENOUS

## 2024-06-13 MED ORDER — OXYCODONE HCL 5 MG PO TABS: 5.0000 mg | ORAL_TABLET | Freq: Once | ORAL | Status: DC | PRN

## 2024-06-13 MED ORDER — DEXAMETHASONE SODIUM PHOSPHATE 10 MG/ML IJ SOLN
INTRAMUSCULAR | Status: AC
  Filled 2024-06-13: qty 1

## 2024-06-13 MED ORDER — POTASSIUM CHLORIDE CRYS ER 20 MEQ PO TBCR: 40.0000 meq | EXTENDED_RELEASE_TABLET | Freq: Every day | ORAL | Status: DC | PRN

## 2024-06-13 MED ORDER — BISACODYL 10 MG RE SUPP: 10.0000 mg | Freq: Every day | RECTAL | Status: DC | PRN

## 2024-06-13 MED ORDER — FENTANYL CITRATE (PF) 250 MCG/5ML IJ SOLN
INTRAMUSCULAR | Status: AC
  Filled 2024-06-13: qty 5

## 2024-06-13 MED ORDER — INSULIN ASPART 100 UNIT/ML IJ SOLN
0.0000 [IU] | Freq: Three times a day (TID) | INTRAMUSCULAR | Status: DC
  Administered 2024-06-13: 2 [IU] via SUBCUTANEOUS
  Administered 2024-06-14: 3 [IU] via SUBCUTANEOUS

## 2024-06-13 MED ORDER — HEPARIN SODIUM (PORCINE) 5000 UNIT/ML IJ SOLN
5000.0000 [IU] | Freq: Three times a day (TID) | INTRAMUSCULAR | Status: DC
  Administered 2024-06-14: 5000 [IU] via SUBCUTANEOUS
  Filled 2024-06-13: qty 1

## 2024-06-13 MED ORDER — MIDAZOLAM HCL 2 MG/2ML IJ SOLN
INTRAMUSCULAR | Status: AC
  Filled 2024-06-13: qty 2

## 2024-06-13 MED ORDER — GABAPENTIN 300 MG PO CAPS: 300.0000 mg | ORAL_CAPSULE | Freq: Three times a day (TID) | ORAL | Status: DC | PRN

## 2024-06-13 MED ORDER — POLYVINYL ALCOHOL 1.4 % OP SOLN
1.0000 [drp] | Freq: Every day | OPHTHALMIC | Status: DC | PRN
  Filled 2024-06-13: qty 15

## 2024-06-13 MED ORDER — CHLORHEXIDINE GLUCONATE CLOTH 2 % EX PADS: 6.0000 | MEDICATED_PAD | Freq: Once | CUTANEOUS | Status: DC

## 2024-06-13 MED ORDER — ACETAMINOPHEN 325 MG PO TABS: 325.0000 mg | ORAL_TABLET | ORAL | Status: DC | PRN

## 2024-06-13 MED ORDER — IODIXANOL 320 MG/ML IV SOLN
INTRAVENOUS | Status: DC | PRN
Start: 2024-06-13 — End: 2024-06-13
  Administered 2024-06-13: 65 mL via INTRA_ARTERIAL

## 2024-06-13 MED ORDER — PROPOFOL 10 MG/ML IV BOLUS
INTRAVENOUS | Status: AC
  Filled 2024-06-13: qty 20

## 2024-06-13 MED ORDER — FINASTERIDE 5 MG PO TABS
5.0000 mg | ORAL_TABLET | Freq: Every day | ORAL | Status: DC
  Administered 2024-06-13 – 2024-06-14 (×2): 5 mg via ORAL
  Filled 2024-06-13 (×2): qty 1

## 2024-06-13 MED ORDER — CLOPIDOGREL BISULFATE 75 MG PO TABS
75.0000 mg | ORAL_TABLET | Freq: Every day | ORAL | Status: DC
  Administered 2024-06-14: 75 mg via ORAL
  Filled 2024-06-13: qty 1

## 2024-06-13 MED ORDER — HEPARIN 6000 UNIT IRRIGATION SOLUTION
Status: DC | PRN
  Administered 2024-06-13: 1

## 2024-06-13 MED ORDER — PHENYLEPHRINE HCL-NACL 20-0.9 MG/250ML-% IV SOLN
INTRAVENOUS | Status: DC | PRN
  Administered 2024-06-13: 40 ug/min via INTRAVENOUS

## 2024-06-13 MED ORDER — ORAL CARE MOUTH RINSE: 15.0000 mL | Freq: Once | OROMUCOSAL | Status: AC

## 2024-06-13 MED ORDER — ONDANSETRON HCL 4 MG/2ML IJ SOLN
INTRAMUSCULAR | Status: DC | PRN
  Administered 2024-06-13: 4 mg via INTRAVENOUS

## 2024-06-13 MED ORDER — FENTANYL CITRATE (PF) 100 MCG/2ML IJ SOLN: 25.0000 ug | INTRAMUSCULAR | Status: DC | PRN

## 2024-06-13 MED ORDER — SENNOSIDES-DOCUSATE SODIUM 8.6-50 MG PO TABS: 1.0000 | ORAL_TABLET | Freq: Every evening | ORAL | Status: DC | PRN

## 2024-06-13 MED ORDER — ONDANSETRON HCL 4 MG/2ML IJ SOLN
INTRAMUSCULAR | Status: AC
Start: 2024-06-13 — End: 2024-06-13
  Filled 2024-06-13: qty 2

## 2024-06-13 MED ORDER — OXYCODONE-ACETAMINOPHEN 5-325 MG PO TABS: 1.0000 | ORAL_TABLET | ORAL | Status: DC | PRN

## 2024-06-13 MED ORDER — FENTANYL CITRATE (PF) 250 MCG/5ML IJ SOLN
INTRAMUSCULAR | Status: DC | PRN
  Administered 2024-06-13: 100 ug via INTRAVENOUS

## 2024-06-13 MED ORDER — CEFAZOLIN SODIUM-DEXTROSE 2-4 GM/100ML-% IV SOLN
2.0000 g | Freq: Three times a day (TID) | INTRAVENOUS | Status: AC
  Administered 2024-06-13 – 2024-06-14 (×2): 2 g via INTRAVENOUS
  Filled 2024-06-13 (×2): qty 100

## 2024-06-13 MED ORDER — HEPARIN SODIUM (PORCINE) 1000 UNIT/ML IJ SOLN
INTRAMUSCULAR | Status: DC | PRN
Start: 2024-06-13 — End: 2024-06-13
  Administered 2024-06-13: 9000 [IU] via INTRAVENOUS
  Administered 2024-06-13 (×2): 2000 [IU] via INTRAVENOUS
  Administered 2024-06-13: 4000 [IU] via INTRAVENOUS

## 2024-06-13 MED ORDER — MYCOPHENOLATE MOFETIL 250 MG PO CAPS
1500.0000 mg | ORAL_CAPSULE | Freq: Every evening | ORAL | Status: DC
  Administered 2024-06-13: 1500 mg via ORAL
  Filled 2024-06-13: qty 6

## 2024-06-13 MED ORDER — LIDOCAINE 2% (20 MG/ML) 5 ML SYRINGE
INTRAMUSCULAR | Status: DC | PRN
  Administered 2024-06-13: 60 mg via INTRAVENOUS

## 2024-06-13 MED ORDER — ROCURONIUM BROMIDE 10 MG/ML (PF) SYRINGE
PREFILLED_SYRINGE | INTRAVENOUS | Status: AC
Start: 2024-06-13 — End: 2024-06-13
  Filled 2024-06-13: qty 10

## 2024-06-13 MED ORDER — SUGAMMADEX SODIUM 200 MG/2ML IV SOLN
INTRAVENOUS | Status: DC | PRN
  Administered 2024-06-13: 200 mg via INTRAVENOUS

## 2024-06-13 MED ORDER — NITROGLYCERIN 0.4 MG SL SUBL: 0.4000 mg | SUBLINGUAL_TABLET | SUBLINGUAL | Status: DC | PRN

## 2024-06-13 SURGICAL SUPPLY — 33 items
BAG COUNTER SPONGE SURGICOUNT (BAG) ×1 IMPLANT
CANISTER SUCTION 3000ML PPV (SUCTIONS) ×1 IMPLANT
CATH BEACON 5.038 65CM KMP-01 (CATHETERS) ×1 IMPLANT
CATH OMNI FLUSH .035X70CM (CATHETERS) ×1 IMPLANT
DERMABOND ADVANCED .7 DNX12 (GAUZE/BANDAGES/DRESSINGS) ×1 IMPLANT
DEVICE CLOSURE PERCLS PRGLD 6F (VASCULAR PRODUCTS) ×4 IMPLANT
ELECTRODE REM PT RTRN 9FT ADLT (ELECTROSURGICAL) ×2 IMPLANT
GLOVE BIO SURGEON STRL SZ7.5 (GLOVE) ×1 IMPLANT
GLOVE BIOGEL PI IND STRL 8 (GLOVE) ×1 IMPLANT
GOWN STRL REUS W/ TWL LRG LVL3 (GOWN DISPOSABLE) ×3 IMPLANT
GOWN STRL REUS W/ TWL XL LVL3 (GOWN DISPOSABLE) ×1 IMPLANT
GRAFT BALLN CATH 65CM (BALLOONS) ×1 IMPLANT
KIT BASIN OR (CUSTOM PROCEDURE TRAY) ×1 IMPLANT
KIT ENCORE 26 ADVANTAGE (KITS) IMPLANT
KIT TURNOVER KIT B (KITS) ×1 IMPLANT
PACK ENDOVASCULAR (PACKS) ×1 IMPLANT
SET MICROPUNCTURE 5F STIFF (MISCELLANEOUS) ×1 IMPLANT
SHEATH BRITE TIP 8FR 23CM (SHEATH) ×1 IMPLANT
SHEATH DRYSEAL FLEX 18FR 33CM (SHEATH) IMPLANT
SHEATH DRYSEAL FLEX 20FR 33CM (SHEATH) IMPLANT
SHEATH PINNACLE 8F 10CM (SHEATH) ×1 IMPLANT
STENT GRFT THORAC ACS 21X21X10 (Endovascular Graft) IMPLANT
STENT VIABAHN VBX 11X39X135 (Permanent Stent) IMPLANT
STOPCOCK MORSE 400PSI 3WAY (MISCELLANEOUS) ×1 IMPLANT
SUT MNCRL AB 4-0 PS2 18 (SUTURE) ×1 IMPLANT
SYR 20ML LL LF (SYRINGE) ×1 IMPLANT
TOWEL GREEN STERILE (TOWEL DISPOSABLE) ×1 IMPLANT
TRAY FOLEY MTR SLVR 16FR STAT (SET/KITS/TRAYS/PACK) ×1 IMPLANT
TUBING ART PRESS 72 MALE/FEM (TUBING) IMPLANT
TUBING HIGH PRESSURE 120CM (CONNECTOR) ×1 IMPLANT
WIRE AMPLATZ SS-J .035X180CM (WIRE) ×2 IMPLANT
WIRE BENTSON .035X145CM (WIRE) ×2 IMPLANT
WIRE ROSEN-J .035X260CM (WIRE) IMPLANT

## 2024-06-13 NOTE — Op Note (Signed)
 Date: June 13, 2024  Preoperative diagnosis: 1.  Infra-renal abdominal aortic aneurysm 2.  High-grade infrarenal abdominal aortic stenosis 3.  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  Ultrasound-guided access of bilateral common femoral arteries for delivery of endograft with Perclose closure 2.  Abdominal aortogram with catheter selection of aorta 3.  Repair of abdominal aortic aneurysm and infrarenal aortic high-grade stenosis with aortic tube graft (21 mm x 10 cm Gore thoracic endograft) 3.  Angioplasty and stent of bilateral common iliac arteries (11 mm x 39 mm VBX)  Surgeon: Dr. Lonni DOROTHA Gaskins, MD  Assistant: Lucie Apt, PA  Indications: 73 year old male seen with disabling bilateral lower extremity claudication.  CTA shows high-grade infrarenal aortic stenosis with small saccular aneurysm as well as bilateral iliac occlusive disease.  He presents for aortic stent graft with bilateral iliac stents after risk benefits discussed.  Assistant was needed given the complexity case and also for wire and sheath exchange.  Findings: Ultrasound-guided access of bilateral common femoral arteries.  8 French sheath was placed in the right groin and a 18 French sheath was placed in the left common femoral artery.  The infrarenal aortic stenosis and aneurysm were treated with a 21 mm x 10 cm Gore thoracic endograft and I used a MOB balloon.  I then stented both common iliac arteries with a 11 mm x 39 mm VBX.  Widely patent infrarenal aortic stent graft and bilateral iliac arteries with exclusion of the aneurysm and no evidence of residual stenosis.  Palpable pedal pulses at completion.   Anesthesia: General  Details: Patient was taken to the operating room after informed consent was obtained.  Placed on the operative table in the supine position.  General endotracheal anesthesia was induced.  The  bilateral groins and abdominal wall were prepped and draped in standard sterile fashion.  Antibiotics were given and timeout performed.  Initially his ultrasound evaluated the right common femoral artery, it was patent, an image was saved.  This was accessed with micro access needle and placed a microwire and then micro sheath I then advanced a Bentson wire and an 8 [place] dilator.  I then deployed a Perclose in the right common femoral artery.  Placed a short 8 [place] sheath.  We then did the same steps in the left groin again evaluated artery with ultrasound and accessed this with micro access and then [facility] wire and 8 [place] dilator.  I then used Perclose at 10:00 and 2:00 and placed a short 8 [place] sheath.  I then exchanged for stiff wires in both groins and upsized to a 18 [place] Gore dry seal in the left common femoral artery.  We left the 8 French sheath in the right common femoral artery.  The patient was given 100 units per kilogram IV heparin .  ACT was checked.  Additional heparin  was given in order to get therapeutic AC with goal 250.  Ultimately I went ahead and delivered a pigtail catheter up the right side into the infrarenal aorta with the main body device up the left side through the 18 French sheath.  Aortogram was obtained identifying the left renal artery which the lowest as well as the infrarenal aortic stenosis and aortic aneurysm.  We then deployed the main body graft in the infrarenal aorta using a 21 mm x 10 cm Gore thoracic graft.  I was very happy with where this deployed and we left it above the aortic bifurcation  with the help of my assistant.  I went ahead and used a MOB balloon and treated the endograft to ensure we had maximal expansion and to treat the aortic stenosis.  I then got another image showed widely patent stent graft with preservation of the renals.  We then went ahead and marked the hypogastrics on the angiogram.  I then selected 11 mm x 39 mm VBX stents for both common  iliac arteries.  These were then delivered up both sides after exchange for Rosen wires and deployed into both common iliac arteries to nominal pressure with the help of my assistant.  Final imaging showed widely patent infrarenal aortic stent graft with both iliac stents widely patent.  I was then satisfied with the results.  The Perclose was tied on the right groin once the sheath was removed and we shot a right groin shot with a microcatheter that showed widely patent common femoral artery.  I was happy with hemostasis.  We then did the same in the left groin removing the 18 French sheath tied down the Perclose at 10:00 o'clock and 2:00 o'clock and then used microcatheter for femoral angiogram and again satisfied with the results.  Ultimately bilateral groin incisions were closed with 4-0 Monocryl and Dermabond.  We had brisk Doppler signals.  Protamine  was given for reversal.  Awakened from anesthesia and taken to recovery in stable condition.  Complication: None  Condition: Stable  Lonni DOROTHA Gaskins, MD [facility] [place] Office: [phone]   [name]

## 2024-06-13 NOTE — Progress Notes (Signed)
 [place] and notified her that patient is in AFIB and states he has not been in AFIB in around 42mo. He is asymptomatic and sitting up eating his dinner.  Patient states he normally takes Eliquis  for AFIB but has not had medication for 3 days. Will continue to monitor patient

## 2024-06-13 NOTE — Anesthesia Postprocedure Evaluation (Signed)
 Anesthesia Post Note  Patient: Stanley Boyd  Procedure(s) Performed: INSERTION, ENDOVASCULAR STENT GRAFT, AORTA, ABDOMINAL (Bilateral: Groin) INSERTION, STENT, ARTERY, ILIAC (Bilateral: Groin) ULTRASOUND GUIDANCE, FOR VASCULAR ACCESS (Bilateral: Groin) ANGIOGRAM (Bilateral: Groin)     Patient location during evaluation: PACU Anesthesia Type: General Level of consciousness: awake and alert Pain management: pain level controlled Vital Signs Assessment: post-procedure vital signs reviewed and stable Respiratory status: spontaneous breathing, nonlabored ventilation, respiratory function stable and patient connected to nasal cannula oxygen Cardiovascular status: blood pressure returned to baseline and stable Postop Assessment: no apparent nausea or vomiting Anesthetic complications: no   No notable events documented.  Last Vitals:  Vitals:   06/13/24 1315 06/13/24 1330  BP: 127/69 128/65  Pulse: 63 65  Resp: 18 18  Temp:    SpO2: 95% 97%    Last Pain:  Vitals:   06/13/24 1245  TempSrc: Oral  PainSc:                  Alissandra Geoffroy S

## 2024-06-13 NOTE — Transfer of Care (Signed)
 Immediate Anesthesia Transfer of Care Note  Patient: Stanley Boyd  Procedure(s) Performed: INSERTION, ENDOVASCULAR STENT GRAFT, AORTA, ABDOMINAL (Bilateral: Groin) INSERTION, STENT, ARTERY, ILIAC (Bilateral: Groin) ULTRASOUND GUIDANCE, FOR VASCULAR ACCESS (Bilateral: Groin) ANGIOGRAM (Bilateral: Groin)  Patient Location: PACU  Anesthesia Type:General  Level of Consciousness: awake  Airway & Oxygen Therapy: Patient Spontanous Breathing and Patient connected to face mask oxygen  Post-op Assessment: Report given to RN and Post -op Vital signs reviewed and stable  Post vital signs: Reviewed and stable  Last Vitals:  Vitals Value Taken Time  BP 150/67 06/13/24 10:39  Temp    Pulse 69 06/13/24 10:41  Resp 19 06/13/24 10:41  SpO2 93 % 06/13/24 10:41  Vitals shown include unfiled device data.  Last Pain:  Vitals:   06/13/24 0655  TempSrc:   PainSc: 7          Complications: No notable events documented.

## 2024-06-13 NOTE — Progress Notes (Addendum)
  Day of Surgery Note    Subjective:  just in to pacu, resting comfortably   Vitals:   06/13/24 0633  BP: (!) 118/55  Pulse: (!) 57  Resp: 17  Temp: 97.6 F (36.4 C)  SpO2: 95%    Incisions:   bilateral groins are soft without hematoma Extremities:  palpable DP pulses bilaterally Lungs:  non labored    Assessment/Plan:  This is a 73 y.o. male who is s/p  EVAR and bilateral iliac stenting  -pt doing well in recovery-bilateral groins soft without hematoma and palpable DP pulses bilaterally.   -due to iliac stents, pt loaded with Plavix  300mg  today and start Plavix  75mg  daily tomorrow.  -restart Eliquis  tomorrow or Friday -DM-pt on SSI and will restart Jardiance  tomorrow morning.  Hold Metformin  since he had IV contrast today.  Restart on Friday. -pt to lay flat x 4 hours -to 4 east later today-anticipate discharge home tomorrow if no issues overnight.  F/u for post op has been sent.    Lucie Apt, PA-C 06/13/2024 10:49 AM [phone]

## 2024-06-13 NOTE — Discharge Instructions (Signed)

## 2024-06-13 NOTE — Anesthesia Procedure Notes (Signed)
 Procedure Name: Intubation Date/Time: 06/13/2024 8:40 AM  Performed by: Julien Manus, CRNAPre-anesthesia Checklist: Patient identified, Emergency Drugs available, Suction available and Patient being monitored Patient Re-evaluated:Patient Re-evaluated prior to induction Oxygen Delivery Method: Circle System Utilized Preoxygenation: Pre-oxygenation with 100% oxygen Induction Type: IV induction Ventilation: Mask ventilation without difficulty Tube type: Oral Tube size: 7.5 mm Number of attempts: 1 Airway Equipment and Method: Stylet and Oral airway Placement Confirmation: ETT inserted through vocal cords under direct vision, positive ETCO2 and breath sounds checked- equal and bilateral Secured at: 22 cm Tube secured with: Tape Dental Injury: Teeth and Oropharynx as per pre-operative assessment

## 2024-06-13 NOTE — H&P (Signed)
 History and Physical Interval Note:  06/13/2024 8:03 AM  Stanley Boyd  has presented today for surgery, with the diagnosis of INFRARENAL AAA W/O RUPTURE.  The various methods of treatment have been discussed with the patient and family. After consideration of risks, benefits and other options for treatment, the patient has consented to  Procedure(s): INSERTION, ENDOVASCULAR STENT GRAFT, AORTA, ABDOMINAL (N/A) as a surgical intervention.  The patient's history has been reviewed, patient examined, no change in status, stable for surgery.  I have reviewed the patient's chart and labs.  Questions were answered to the patient's satisfaction.    Endovascular repair of infra-renal aortic stenosis with AAA and iliac occlusive disease with disabling claudication  Stanley Boyd     Virtual Visit via Telephone Note       I connected with Stanley Boyd on 06/05/2024 using the [email or web address] by telephone and verified that I was speaking with the correct person using two identifiers. Patient was located at home and accompanied by wife. I am located at [facility].   The limitations of evaluation and management by telemedicine and the availability of in person appointments have been previously discussed with the patient and are documented in the patients chart. The patient expressed understanding and consented to proceed.   PCP: Stanley Garnette KIDD, MD   Chief Complaint: Discuss EVAR   History of Present Illness: Stanley Boyd is a 73 y.o. male with history of atrial fibrillation, hyperlipidemia, diabetes, autoimmune necrotizing myopathy and coronary disease that presents for phone visit to discuss planned AAA repair with moderate to high-grade stenosis of the infrarenal abdominal aorta and bilateral common iliac arteries.       Past Medical History:  Diagnosis Date   AAA (abdominal aortic aneurysm) (HCC)     BPH (benign prostatic hyperplasia)     Bradycardia      40s to low 50s   Chronic back pain       multiple surgeries following fall from a deer stand   Coronary artery disease      5 stents   Headache      probably monthly (07/08/2015)   Hyperlipidemia      on injectable medication   Kidney stones      years ago; never had OR/scopes   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   Neuromuscular disorder (HCC)     Peripheral vascular disease (HCC)     Pneumonia 08/2014               Past Surgical History:  Procedure Laterality Date   ANTERIOR CERVICAL DECOMP/DISCECTOMY FUSION   31; 42; 2000    [facility]   BACK SURGERY       BIOPSY PROSTATE   2017   CARDIAC CATHETERIZATION N/A 07/08/2015    Procedure: Left Heart Cath and Coronary Angiography;  Surgeon: Gordy Bergamo, MD;  Location: [facility] INVASIVE CV LAB;  Service: Cardiovascular;  Laterality: N/A;   CARDIAC CATHETERIZATION N/A 07/17/2015    Procedure: Coronary Stent Intervention;  Surgeon: Gordy Bergamo, MD;  Location: [facility] INVASIVE CV LAB;  Service: Cardiovascular;  Laterality: N/A;   CARDIAC CATHETERIZATION N/A 04/16/2016    Procedure: Left Heart Cath and Coronary Angiography;  Surgeon: Gordy Bergamo, MD;  Location: [facility] INVASIVE CV LAB;  Service: Cardiovascular;  Laterality: N/A;   CARDIAC CATHETERIZATION N/A 04/16/2016    Procedure: Coronary Stent Intervention;  Surgeon: Gordy Bergamo, MD;  Location: [facility] INVASIVE CV LAB;  Service: Cardiovascular;  Laterality: N/A;   CATARACT EXTRACTION   11/2021  COLONOSCOPY N/A 04/05/2014    Procedure: COLONOSCOPY;  Surgeon: Margo LITTIE Haddock, MD;  Location: [facility] ENDO SUITE;  Service: Endoscopy;  Laterality: N/A;  10:30 AM   COLONOSCOPY N/A 01/25/2020    Procedure: COLONOSCOPY;  Surgeon: Haddock Margo LITTIE, MD;  Location: [facility] SU[facility];  Service: Endoscopy;  Laterality: N/A;  9:30   CORONARY ANGIOPLASTY WITH STENT PLACEMENT   07/08/2015    2 stents   CORONARY STENT PLACEMENT   04/16/2016    PTCA and stenting of the ostial LAD with implantation of a 3.0 x 12 mm resolute integrity DES, stenosis reduced from 80% to 0%  with maintenance of TIMI-3 flow. Ostial OM1 in-stent restenosis reduced to less than 20% with a 2.0 x 12 mm emerge balloon at 14 atmospheric pressure   LEFT HEART CATH AND CORONARY ANGIOGRAPHY N/A 05/13/2020    Procedure: LEFT HEART CATH AND CORONARY ANGIOGRAPHY;  Surgeon: Ladona Heinz, MD;  Location: [facility] INVASIVE CV LAB;  Service: Cardiovascular;  Laterality: N/A;   LEFT HEART CATH AND CORONARY ANGIOGRAPHY N/A 02/09/2022    Procedure: LEFT HEART CATH AND CORONARY ANGIOGRAPHY;  Surgeon: Ladona Heinz, MD;  Location: [facility] INVASIVE CV LAB;  Service: Cardiovascular;  Laterality: N/A;   LIVER BIOPSY   03/2019   LUMBAR DISC SURGERY   1984   MUSCLE BIOPSY N/A 02/20/2019    Procedure: LEFT QUADRICEP MUSCLE  BIOPSY AND PUNCH BIOPSY OF RIGHT CHEST;  Surgeon: Teresa Stanley HERO, MD;  Location: [facility] OR;  Service: General;  Laterality: N/A;   POLYPECTOMY   01/25/2020    Procedure: POLYPECTOMY;  Surgeon: Haddock Margo LITTIE, MD;  Location: [facility];  Service: Endoscopy;;  sigmoid   PTCA   07/17/2015    OMI    DES   SPINE SURGERY   1984, 1994, 1995, 2000          Active Medications  No outpatient medications have been marked as taking for the 06/05/24 encounter (Appointment) with Gretta Stanley PARAS, MD.      12 system ROS was negative unless otherwise noted in HPI   Observations/Objective:     CTA reviewed from 04/20/2024 with moderate to severe stenosis of the infrarenal aorta just upstream to about 3.3 cm abdominal aortic aneurysm. Also has a high-grade right common iliac stenosis and moderate left common iliac stenosis    Assessment and Plan:   73 y.o. male with history of atrial fibrillation, hyperlipidemia, diabetes, autoimmune necrotizing myopathy and coronary disease that presents for phone visit to discuss planned AAA repair with moderate to high-grade stenosis of the infrarenal abdominal aorta and bilateral common iliac arteries.   I again discussed plan for bilateral transfemoral access in the  operating room.  Discussed using a thoracic stent graft to repair his infrarenal aortic stenosis with small aneurysm.  Discussed that we would not normally address his aneurysm but given covered stent treatment this should treat that as well.  Would address his bilateral common iliac disease with VBX stents.  I discussed risk and benefits including risk of vessel injury and atheroembolism as well as general anesthesia along with bleeding, infection etc.  He wishes to proceed.  Scheduled for next Wednesday, 06/13/2024.  All questions answered will need to hold his Eliquis .   Follow Up Instructions:    Follow up: surgery next week   I discussed the assessment and treatment plan with the patient. The patient was provided an opportunity to ask questions and all were answered. The patient agreed with the plan and  demonstrated an understanding of the instructions.   The patient was advised to call back or seek an in-person evaluation if the symptoms worsen or if the condition fails to improve as anticipated.   I spent 10 minutes with the patient via telephone encounter including reviewing imaging.     Signed, Stanley Boyd [facility] [place] Office: [phone]

## 2024-06-14 ENCOUNTER — Encounter (HOSPITAL_COMMUNITY): Payer: Self-pay | Admitting: Vascular Surgery

## 2024-06-14 ENCOUNTER — Other Ambulatory Visit (HOSPITAL_COMMUNITY): Payer: Self-pay

## 2024-06-14 DIAGNOSIS — Z9889 Other specified postprocedural states: Secondary | ICD-10-CM

## 2024-06-14 DIAGNOSIS — Z95828 Presence of other vascular implants and grafts: Secondary | ICD-10-CM

## 2024-06-14 LAB — BASIC METABOLIC PANEL WITH GFR
Anion gap: 10 (ref 5–15)
BUN: 10 mg/dL (ref 8–23)
CO2: 22 mmol/L (ref 22–32)
Calcium: 8.8 mg/dL — ABNORMAL LOW (ref 8.9–10.3)
Chloride: 107 mmol/L (ref 98–111)
Creatinine, Ser: 0.87 mg/dL (ref 0.61–1.24)
GFR, Estimated: >60 mL/min (ref >60)
Glucose, Bld: 191 mg/dL — ABNORMAL HIGH (ref 70–99)
Potassium: 3.9 mmol/L (ref 3.5–5.1)
Sodium: 139 mmol/L (ref 135–145)

## 2024-06-14 LAB — CBC
HCT: 38.3 % — ABNORMAL LOW (ref 39.0–52.0)
Hemoglobin: 12.7 g/dL — ABNORMAL LOW (ref 13.0–17.0)
MCH: 29.7 pg (ref 26.0–34.0)
MCHC: 33.2 g/dL (ref 30.0–36.0)
MCV: 89.7 fL (ref 80.0–100.0)
Platelets: 199 K/uL (ref 150–400)
RBC: 4.27 MIL/uL (ref 4.22–5.81)
RDW: 13.8 % (ref 11.5–15.5)
WBC: 9.4 K/uL (ref 4.0–10.5)
nRBC: 0 % (ref 0.0–0.2)

## 2024-06-14 LAB — GLUCOSE, CAPILLARY
Glucose-Capillary: 149 mg/dL — ABNORMAL HIGH (ref 70–99)
Glucose-Capillary: 180 mg/dL — ABNORMAL HIGH (ref 70–99)

## 2024-06-14 MED ORDER — CLOPIDOGREL BISULFATE 75 MG PO TABS
75.0000 mg | ORAL_TABLET | Freq: Every day | ORAL | 1 refills | Status: DC
  Filled 2024-06-14: qty 30, 30d supply, fill #0

## 2024-06-14 MED ORDER — OXYCODONE-ACETAMINOPHEN 5-325 MG PO TABS
1.0000 | ORAL_TABLET | Freq: Four times a day (QID) | ORAL | 0 refills | Status: DC | PRN
  Filled 2024-06-14: qty 12, 3d supply, fill #0

## 2024-06-14 NOTE — Plan of Care (Signed)
  Problem: Education: Goal: Knowledge of General Education information will improve Description: Including pain rating scale, medication(s)/side effects and non-pharmacologic comfort measures Outcome: Progressing   Problem: Health Behavior/Discharge Planning: Goal: Ability to manage health-related needs will improve Outcome: Progressing   Problem: Clinical Measurements: Goal: Ability to maintain clinical measurements within normal limits will improve Outcome: Progressing Goal: Will remain free from infection Outcome: Progressing Goal: Diagnostic test results will improve Outcome: Progressing Goal: Respiratory complications will improve Outcome: Progressing   Problem: Coping: Goal: Level of anxiety will decrease Outcome: Progressing   Problem: Elimination: Goal: Will not experience complications related to urinary retention Outcome: Progressing   Problem: Pain Managment: Goal: General experience of comfort will improve and/or be controlled Outcome: Progressing   Problem: Safety: Goal: Ability to remain free from injury will improve Outcome: Progressing   Problem: Skin Integrity: Goal: Risk for impaired skin integrity will decrease Outcome: Progressing
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
## 2024-06-14 NOTE — Progress Notes (Addendum)
 DISCHARGE NOTE HOME Stanley Boyd to be discharged Home per MD order. Discussed prescriptions and follow up appointments with the patient. Prescriptions given to patient; medication list explained in detail. Patient verbalized understanding.  Skin clean, dry and intact without evidence of skin break down, no evidence of skin tears noted. IV catheter discontinued intact. Site without signs and symptoms of complications. Dressing and pressure applied. Pt denies pain at the site currently. No complaints noted.  Patient free of lines, drains, and wounds. See Lda for incision at discharge Bilateral groin  An After Visit Summary (AVS) was printed and given to the patient. Patient escorted via wheelchair, and discharged home via private auto.  [name], RN

## 2024-06-14 NOTE — Progress Notes (Signed)
 Mobility Specialist Progress Note:    06/14/24 0926  Mobility  Activity Ambulated with assistance  Level of Assistance Modified independent, requires aide device or extra time  Assistive Device Other (Comment) (IV pole)  Distance Ambulated (ft) 380 ft  Activity Response Tolerated well  Mobility Referral Yes  Mobility visit 1 Mobility  Mobility Specialist Start Time (ACUTE ONLY) [id number]  Mobility Specialist Stop Time (ACUTE ONLY) 0935  Mobility Specialist Time Calculation (min) (ACUTE ONLY) 9 min   Pt received in bed, agreeable to mobility session. Ambulated in hallway, ModI using IV pole. Tolerated well, asx throughout. Returned pt to room, left with all needs met, eager for d/c.    Angeligue Bowne Mobility Specialist Please contact via SecureChat or  Rehab office at [phone]

## 2024-06-14 NOTE — Progress Notes (Signed)
   06/14/24 0948  TOC Brief Assessment  Insurance and Status Reviewed  Patient has primary care physician Yes  Home environment has been reviewed home with wife  Prior level of function: independent  Prior/Current Home Services No current home services  Social Drivers of Health Review SDOH reviewed no interventions necessary  Readmission risk has been reviewed Yes  Transition of care needs no transition of care needs at this time    Pt s/p Repair of abdominal aortic aneurysm - stable for transition home today. No HH or DME needs noted. Wife at bedside and will transport home. Pt will follow up as per AVS, TOC pharmacy to fill meds for discharge. No IP CM needs noted.

## 2024-06-14 NOTE — Progress Notes (Addendum)
 Progress Note    06/14/2024 6:37 AM 1 Day Post-Op  Subjective:  denies any pain; wife states he went in afib yesterday but back to normal today.  Says his feet feel better this morning.  Afebrile HR 50's-70's  100's-130's systolic 94% RA  Vitals:   06/13/24 2300 06/14/24 0437  BP: (!) 130/53 (!) 122/58  Pulse: 70 62  Resp: 20 20  Temp: 98.3 F (36.8 C) 97.8 F (36.6 C)  SpO2: 95% 96%    Physical Exam: General:  no distress Cardiac:  regular Lungs:  non labored Incisions:  bilateral groins soft without hematoma Extremities:  palpable DP pulses bilaterally Abdomen:  soft, NT  CBC    Component Value Date/Time   WBC 4.4 06/13/2024 1059   RBC 4.19 (L) 06/13/2024 1059   HGB 12.2 (L) 06/13/2024 1059   HGB 15.1 04/06/2024 1504   HCT 38.2 (L) 06/13/2024 1059   HCT 47.6 04/06/2024 1504   PLT 169 06/13/2024 1059   PLT 244 04/06/2024 1504   MCV 91.2 06/13/2024 1059   MCV 91 04/06/2024 1504   MCH 29.1 06/13/2024 1059   MCHC 31.9 06/13/2024 1059   RDW 13.8 06/13/2024 1059   RDW 13.8 04/06/2024 1504   LYMPHSABS 2.3 04/06/2024 1504   MONOABS 0.4 05/21/2022 1214   EOSABS 0.1 04/06/2024 1504   BASOSABS 0.1 04/06/2024 1504    BMET    Component Value Date/Time   NA 139 06/13/2024 1059   NA 141 04/06/2024 1504   K 3.6 06/13/2024 1059   CL 105 06/13/2024 1059   CO2 24 06/13/2024 1059   GLUCOSE 151 (H) 06/13/2024 1059   BUN 10 06/13/2024 1059   BUN 14 04/06/2024 1504   CREATININE 0.79 06/13/2024 1059   CREATININE 0.94 08/07/2020 1202   CALCIUM  8.4 (L) 06/13/2024 1059   GFRNONAA >60 06/13/2024 1059   GFRNONAA 84 08/07/2020 1202   GFRAA 93 08/22/2020 1150   GFRAA 97 08/07/2020 1202    INR    Component Value Date/Time   INR 1.1 06/13/2024 1059     Intake/Output Summary (Last 24 hours) at 06/14/2024 9362 Last data filed at 06/14/2024 0600 Gross per 24 hour  Intake 1415.84 ml  Output 2405 ml  Net -989.16 ml      Assessment/Plan:  73 y.o. male is s/p:    Repair of abdominal aortic aneurysm and infrarenal aortic high-grade stenosis with aortic tube graft (21 mm x 10 cm Gore thoracic endograft), angioplasty and stent of bilateral common iliac arteries   1 Day Post-Op   -pt doing well this am.  His groins are soft without hematoma.  He has voided.  Needs to walk in hallways.   -afib yesterday and back in NSR this am -plan for plavix  for one month and then change to baby asa.  Discussed this with pt and wife.  Will send rx to TOC.   -restart Eliquis  tomorrow morning.  -f/u with Dr. Gretta in 4 weeks with CTA a/p.  Office will arrange appt.   -discharge home later this morning.  Labs pending   Lucie Apt, [place] [facility] [phone] 06/14/2024 6:37 AM  Addendum:  upon reviewing pt's home meds, he has NTG and Viagra  listed.  Discussed with pt and wife that he is not to take these together and can cause significant decrease in blood pressure.  He has never used the NTG.   Since he has never used this, I have removed it from the [facility].  His  wife states he breaks out in cold sweats with any heart event he has had.    Lucie Apt, [facility] 06/14/2024 7:57 AM  I have seen and evaluated the patient. I agree with the PA note as documented above.  Postop day 1 status post aortic stent graft with bilateral iliac stenting for severe infrarenal aortic stenosis with small aneurysm as well as severe iliac occlusive disease with disabling claudication.  Palpable DP pulses.  Both groins look good.  Labs reassuring.  Plan discharge today on Plavix  for 1 month and then 81 mg aspirin  after that.  Can resume his Eliquis  tomorrow.  Follow-up arranged in 1 month with CTA.  Lonni DOROTHA Gaskins, MD [facility] [place] Office: [phone]

## 2024-06-15 ENCOUNTER — Other Ambulatory Visit: Payer: Self-pay

## 2024-06-15 DIAGNOSIS — I7143 Infrarenal abdominal aortic aneurysm, without rupture: Secondary | ICD-10-CM

## 2024-07-12 ENCOUNTER — Other Ambulatory Visit (HOSPITAL_COMMUNITY): Payer: Self-pay

## 2024-07-25 ENCOUNTER — Ambulatory Visit (HOSPITAL_COMMUNITY)
Admission: RE | Admit: 2024-07-25 | Discharge: 2024-07-25 | Disposition: A | Discharge status: Home / Self Care | Source: Ambulatory Visit | Attending: Cardiovascular Disease | Admitting: Cardiovascular Disease

## 2024-07-25 DIAGNOSIS — I7143 Infrarenal abdominal aortic aneurysm, without rupture: Secondary | ICD-10-CM | POA: Diagnosis present

## 2024-07-25 MED ORDER — IOHEXOL 350 MG/ML SOLN
100.0000 mL | Freq: Once | INTRAVENOUS | Status: AC | PRN
  Administered 2024-07-25: 100 mL via INTRAVENOUS

## 2024-07-31 ENCOUNTER — Other Ambulatory Visit

## 2024-07-31 ENCOUNTER — Encounter: Payer: Self-pay | Admitting: Vascular Surgery

## 2024-07-31 ENCOUNTER — Ambulatory Visit: Attending: Vascular Surgery | Admitting: Vascular Surgery

## 2024-07-31 VITALS — BP 108/63 | HR 50 | Temp 97.9°F | Resp 16 | Ht 69.0 in | Wt 188.5 lb

## 2024-07-31 DIAGNOSIS — I771 Stricture of artery: Secondary | ICD-10-CM

## 2024-07-31 DIAGNOSIS — I7143 Infrarenal abdominal aortic aneurysm, without rupture: Secondary | ICD-10-CM
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
## 2024-08-01 ENCOUNTER — Other Ambulatory Visit: Payer: Self-pay

## 2024-08-01 ENCOUNTER — Ambulatory Visit: Payer: Self-pay | Admitting: Neurology

## 2024-08-01 DIAGNOSIS — I771 Stricture of artery: Secondary | ICD-10-CM

## 2024-08-01 LAB — CK: Total CK: 997 U/L — ABNORMAL HIGH (ref 19–278)

## 2024-08-06 ENCOUNTER — Encounter: Payer: Self-pay | Admitting: Neurology

## 2024-08-08 ENCOUNTER — Ambulatory Visit: Admitting: Neurology

## 2024-08-09 ENCOUNTER — Other Ambulatory Visit: Payer: Self-pay

## 2024-08-13 ENCOUNTER — Other Ambulatory Visit (HOSPITAL_COMMUNITY): Payer: Self-pay

## 2024-08-15 ENCOUNTER — Other Ambulatory Visit (HOSPITAL_BASED_OUTPATIENT_CLINIC_OR_DEPARTMENT_OTHER): Payer: Self-pay

## 2024-08-15 ENCOUNTER — Other Ambulatory Visit: Payer: Self-pay

## 2024-08-17 ENCOUNTER — Other Ambulatory Visit: Payer: Self-pay | Admitting: Family Medicine

## 2024-08-18 LAB — LIPID PANEL W/O CHOL/HDL RATIO
Cholesterol, Total: 140 mg/dL (ref 100–199)
HDL: 42 mg/dL (ref >39)
LDL Chol Calc (NIH): 74 mg/dL (ref 0–99)
Triglycerides: 139 mg/dL (ref 0–149)
VLDL Cholesterol Cal: 24 mg/dL (ref 5–40)

## 2024-08-18 LAB — CBC WITH DIFFERENTIAL/PLATELET
Basophils Absolute: 0.1 x10E3/uL (ref 0.0–0.2)
Basos: 1 %
EOS (ABSOLUTE): 0.1 x10E3/uL (ref 0.0–0.4)
Eos: 1 %
Hematocrit: 45.8 % (ref 37.5–51.0)
Hemoglobin: 15.1 g/dL (ref 13.0–17.7)
Immature Grans (Abs): 0 x10E3/uL (ref 0.0–0.1)
Immature Granulocytes: 0 %
Lymphocytes Absolute: 2.2 x10E3/uL (ref 0.7–3.1)
Lymphs: 34 %
MCH: 29.7 pg (ref 26.6–33.0)
MCHC: 33 g/dL (ref 31.5–35.7)
MCV: 90 fL (ref 79–97)
Monocytes Absolute: 0.6 x10E3/uL (ref 0.1–0.9)
Monocytes: 10 %
Neutrophils Absolute: 3.4 x10E3/uL (ref 1.4–7.0)
Neutrophils: 54 %
Platelets: 231 x10E3/uL (ref 150–450)
RBC: 5.08 x10E6/uL (ref 4.14–5.80)
RDW: 12.8 % (ref 11.6–15.4)
WBC: 6.3 x10E3/uL (ref 3.4–10.8)

## 2024-08-18 LAB — COMPREHENSIVE METABOLIC PANEL WITH GFR
ALT: 47 IU/L — ABNORMAL HIGH (ref 0–44)
AST: 46 IU/L — ABNORMAL HIGH (ref 0–40)
Albumin: 4.5 g/dL (ref 3.8–4.8)
Alkaline Phosphatase: 68 IU/L (ref 47–123)
BUN/Creatinine Ratio: 17 (ref 10–24)
BUN: 15 mg/dL (ref 8–27)
Bilirubin Total: 0.5 mg/dL (ref 0.0–1.2)
CO2: 23 mmol/L (ref 20–29)
Calcium: 9.9 mg/dL (ref 8.6–10.2)
Chloride: 99 mmol/L (ref 96–106)
Creatinine, Ser: 0.87 mg/dL (ref 0.76–1.27)
Globulin, Total: 2.5 g/dL (ref 1.5–4.5)
Glucose: 89 mg/dL (ref 70–99)
Potassium: 4.7 mmol/L (ref 3.5–5.2)
Sodium: 138 mmol/L (ref 134–144)
Total Protein: 7 g/dL (ref 6.0–8.5)
eGFR: 92 mL/min/1.73 (ref >59)

## 2024-08-18 LAB — MICROALBUMIN, URINE: Microalbumin, Urine: 68.8 ug/mL

## 2024-08-18 LAB — SPECIMEN STATUS REPORT

## 2024-08-18 LAB — CK: Total CK: 1277 U/L (ref 41–331)

## 2024-08-20 ENCOUNTER — Ambulatory Visit: Payer: Self-pay | Admitting: Family Medicine

## 2024-08-21 ENCOUNTER — Ambulatory Visit: Payer: Self-pay | Admitting: Family Medicine

## 2024-08-21 ENCOUNTER — Ambulatory Visit: Admitting: Family Medicine

## 2024-08-21 VITALS — BP 120/68 | HR 52 | Temp 97.8°F | Ht 69.0 in | Wt 187.8 lb

## 2024-08-21 DIAGNOSIS — Z0000 Encounter for general adult medical examination without abnormal findings: Secondary | ICD-10-CM | POA: Diagnosis not present

## 2024-08-21 DIAGNOSIS — I480 Paroxysmal atrial fibrillation: Secondary | ICD-10-CM | POA: Diagnosis not present

## 2024-08-21 DIAGNOSIS — I25118 Atherosclerotic heart disease of native coronary artery with other forms of angina pectoris: Secondary | ICD-10-CM

## 2024-08-21 DIAGNOSIS — E118 Type 2 diabetes mellitus with unspecified complications: Secondary | ICD-10-CM | POA: Diagnosis not present

## 2024-08-21 DIAGNOSIS — R7401 Elevation of levels of liver transaminase levels: Secondary | ICD-10-CM

## 2024-08-21 DIAGNOSIS — E785 Hyperlipidemia, unspecified: Secondary | ICD-10-CM

## 2024-08-21 DIAGNOSIS — Z7984 Long term (current) use of oral hypoglycemic drugs: Secondary | ICD-10-CM

## 2024-08-21 DIAGNOSIS — Z23 Encounter for immunization: Secondary | ICD-10-CM

## 2024-08-21 LAB — MICROALBUMIN / CREATININE URINE RATIO
Creatinine,U: 55.4 mg/dL
Microalb Creat Ratio: 82.6 mg/g — ABNORMAL HIGH (ref 0.0–30.0)
Microalb, Ur: 4.6 mg/dL — ABNORMAL HIGH (ref 0.0–1.9)

## 2024-08-21 LAB — POCT GLYCOSYLATED HEMOGLOBIN (HGB A1C): Hemoglobin A1C: 6.3 % — AB (ref 4.0–5.6)

## 2024-08-21 NOTE — Progress Notes (Signed)
 Phone: [phone]   Subjective:  Patient presents today for their annual physical. Chief complaint-noted.   See problem oriented charting- ROS- full  review of systems was completed and negative  except for topics noted under acute/chronic concerns  The following were reviewed and entered/updated in epic: Past Medical History:  Diagnosis Date   AAA (abdominal aortic aneurysm)    BPH (benign prostatic hyperplasia)    Bradycardia    40s to low 50s   Chronic back pain    multiple surgeries following fall from a deer stand   Coronary artery disease    5 stents   Diabetes mellitus without complication (HCC)    Headache    probably monthly (07/08/2015)   Hyperlipidemia    on injectable medication   Kidney stones    years ago; never had OR/scopes   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   Neuromuscular disorder (HCC)    Peripheral vascular disease    Pneumonia 08/2014   Patient Active Problem List   Diagnosis Date Noted   Paroxysmal atrial fibrillation (HCC) 12/16/2023    Priority: High   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    Priority: High   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) 03/23/2019    Priority: High   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    Priority: High   CAD (coronary artery disease), native coronary artery 07/13/2015    Priority: High   S/P PTCA (percutaneous transluminal coronary angioplasty) 07/08/2015    Priority: High   Elevated PSA 02/25/2017    Priority: Medium    Hemiparesis (HCC) 09/18/2014    Priority: Medium    BPH (benign prostatic hyperplasia) 09/18/2014    Priority: Medium    Hyperlipidemia     Priority: Medium    Bradycardia     Priority: Medium    Diarrhea 07/13/2023    Priority: Low   Polyp of sigmoid colon     Priority: Low   B12 deficiency 02/26/2019    Priority: Low   History of colonic polyps 10/15/2014    Priority: Low   Transaminitis 09/18/2014    Priority: Low   S/P insertion of endovascular thoracic  aortic stent graft 06/13/2024   AAA (abdominal aortic aneurysm) 06/13/2024   Abdominal aortic stenosis 06/05/2024   AAA (abdominal aortic aneurysm) without rupture 05/29/2024   Atherosclerosis of native arteries of extremity with intermittent claudication 05/29/2024   Iliac artery stenosis, bilateral 05/29/2024   Infrarenal abdominal aortic aneurysm (AAA) without rupture 04/18/2024   Past Surgical History:  Procedure Laterality Date   ABDOMINAL AORTIC ENDOVASCULAR STENT GRAFT Bilateral 06/13/2024   Procedure: INSERTION, ENDOVASCULAR STENT GRAFT, AORTA, ABDOMINAL;  Surgeon: Gretta Lonni PARAS, MD;  Location: [facility] OR;  Service: Vascular;  Laterality: Bilateral;   ANTERIOR CERVICAL DECOMP/DISCECTOMY FUSION  1986; 1987; 2000   [facility]   AORTOGRAM Bilateral 06/13/2024   Procedure: GERALYN;  Surgeon: Gretta Lonni PARAS, MD;  Location: [facility] OR;  Service: Vascular;  Laterality: Bilateral;   BACK SURGERY     BIOPSY PROSTATE  2017   CARDIAC CATHETERIZATION N/A 07/08/2015   Procedure: Left Heart Cath and Coronary Angiography;  Surgeon: Gordy Bergamo, MD;  Location: [facility] INVASIVE CV LAB;  Service: Cardiovascular;  Laterality: N/A;   CARDIAC CATHETERIZATION N/A 07/17/2015   Procedure: Coronary Stent Intervention;  Surgeon: Gordy Bergamo, MD;  Location: [facility] INVASIVE CV LAB;  Service: Cardiovascular;  Laterality: N/A;   CARDIAC CATHETERIZATION N/A 04/16/2016   Procedure: Left Heart Cath and Coronary Angiography;  Surgeon: Gordy Bergamo, MD;  Location: [facility]  INVASIVE CV LAB;  Service: Cardiovascular;  Laterality: N/A;   CARDIAC CATHETERIZATION N/A 04/16/2016   Procedure: Coronary Stent Intervention;  Surgeon: Gordy Bergamo, MD;  Location: [facility] INVASIVE CV LAB;  Service: Cardiovascular;  Laterality: N/A;   CATARACT EXTRACTION  11/2021   COLONOSCOPY N/A 04/05/2014   Procedure: COLONOSCOPY;  Surgeon: Margo LITTIE Haddock, MD;  Location: [facility] ENDO SUITE;  Service: Endoscopy;  Laterality: N/A;  10:30 AM   COLONOSCOPY N/A 01/25/2020    Procedure: COLONOSCOPY;  Surgeon: Haddock Margo LITTIE, MD;  Location: [facility] END[facility] SUITE;  Service: Endoscopy;  Laterality: N/A;  9:30   CORONARY ANGIOPLASTY WITH STENT PLACEMENT  07/08/2015   2 stents   CORONARY STENT PLACEMENT  04/16/2016   PTCA and stenting of the ostial LAD with implantation of a 3.0 x 12 mm resolute integrity DES, stenosis reduced from 80% to 0% with maintenance of TIMI-3 flow. Ostial OM1 in-stent restenosis reduced to less than 20% with a 2.0 x 12 mm emerge balloon at 14 atmospheric pressure   INSERTION OF ILIAC STENT Bilateral 06/13/2024   Procedure: INSERTION, STENT, ARTERY, ILIAC;  Surgeon: Gretta Lonni PARAS, MD;  Location: [facility] OR;  Service: Vascular;  Laterality: Bilateral;   LEFT HEART CATH AND CORONARY ANGIOGRAPHY N/A 05/13/2020   Procedure: LEFT HEART CATH AND CORONARY ANGIOGRAPHY;  Surgeon: Bergamo Gordy, MD;  Location: [facility] INVASIVE CV LAB;  Service: Cardiovascular;  Laterality: N/A;   LEFT HEART CATH AND CORONARY ANGIOGRAPHY N/A 02/09/2022   Procedure: LEFT HEART CATH AND CORONARY ANGIOGRAPHY;  Surgeon: Bergamo Gordy, MD;  Location: [facility] INVASIVE CV LAB;  Service: Cardiovascular;  Laterality: N/A;   LIVER BIOPSY  03/2019   LUMBAR DISC SURGERY  1984   MUSCLE BIOPSY N/A 02/20/2019   Procedure: LEFT QUADRICEP MUSCLE  BIOPSY AND PUNCH BIOPSY OF RIGHT CHEST;  Surgeon: Teresa Lonni HERO, MD;  Location: [facility] OR;  Service: General;  Laterality: N/A;   POLYPECTOMY  01/25/2020   Procedure: POLYPECTOMY;  Surgeon: Haddock Margo LITTIE, MD;  Location: [facility];  Service: Endoscopy;;  sigmoid   PTCA  07/17/2015   OMI    DES   SPINE SURGERY  1984, 1994, 1995, 2000   ULTRASOUND GUIDANCE FOR VASCULAR ACCESS Bilateral 06/13/2024   Procedure: ULTRASOUND GUIDANCE, FOR VASCULAR ACCESS;  Surgeon: Gretta Lonni PARAS, MD;  Location: [facility] OR;  Service: Vascular;  Laterality: Bilateral;    Family History  Problem Relation Age of Onset   Alzheimer's disease Mother    Cancer Father    Breast cancer  Sister    Cancer Sister    Lung cancer Brother    Cancer Brother    Colon cancer Other        grandmother    Medications- reviewed and updated Current Outpatient Medications  Medication Sig Dispense Refill   apixaban  (ELIQUIS ) 5 MG TABS tablet Take 1 tablet (5 mg total) by mouth 2 (two) times daily. 180 tablet 1   Carboxymethylcellulose Sodium (REFRESH TEARS OP) Place 1 drop into both eyes daily as needed (dry eyes).     empagliflozin  (JARDIANCE ) 25 MG TABS tablet Take 1 tablet (25 mg total) by mouth daily before breakfast. 90 tablet 3   Evolocumab  (REPATHA  SURECLICK) 140 MG/ML SOAJ Inject 140 mg into the skin every 14 (fourteen) days. 6 mL 3   finasteride  (PROSCAR ) 5 MG tablet Take 1 tablet (5 mg total) by mouth daily. 90 tablet 3   gabapentin  (NEURONTIN ) 300 MG capsule Take 1 capsule (300 mg total) by mouth 3 (three)  times daily as needed. 90 capsule 3   metFORMIN  (GLUCOPHAGE -XR) 500 MG 24 hr tablet Take 1 tablet (500 mg total) by mouth 2 (two) times daily with a meal. 180 tablet 3   mycophenolate  (CELLCEPT ) 500 MG tablet Take 2 tablets (1,000 mg total) by mouth in the morning AND 3 tablets (1,500 mg total) every evening. 450 tablet 3   pantoprazole  (PROTONIX ) 20 MG tablet Take 1 tablet (20 mg total) by mouth daily. 90 tablet 3   sildenafil  (VIAGRA ) 100 MG tablet Take 1 tablet (100 mg total) by mouth daily as needed for erectile dysfunction. 10 tablet 11   tamsulosin  (FLOMAX ) 0.4 MG CAPS capsule Take 1 capsule (0.4 mg total) by mouth daily. 90 capsule 3   tiZANidine  (ZANAFLEX ) 4 MG tablet Take 4 mg by mouth every 6 (six) hours as needed for muscle spasms.     VASCEPA  1 g capsule Take 2 capsules (2 g total) by mouth 2 (two) times daily. 120 capsule 9   clopidogrel  (PLAVIX ) 75 MG tablet Take 1 tablet (75 mg total) by mouth daily. (Patient not taking: Reported on 08/21/2024) 30 tablet 1   oxyCODONE -acetaminophen  (PERCOCET) 5-325 MG tablet Take 1 tablet by mouth every 6 (six) hours as needed  for severe pain (pain score 7-10). (Patient not taking: Reported on 08/21/2024) 12 tablet 0   No current facility-administered medications for this visit.    Allergies-reviewed and updated Allergies  Allergen Reactions   Covid-19 Mrna Vacc (Moderna) Other (See Comments)    Optic neuropathy after original 2 vaccines    Statins Other (See Comments)    Myositis    Social History   Social History Narrative   Married. 2 children (Jayce Springbrook, DO of [place])      Retired/disability after accident in 2000.       2 story home   Right handed   Objective  Objective:  BP 120/68 (BP Location: Left Arm, Patient Position: Sitting, Cuff Size: Normal)   Pulse (!) 52   Temp 97.8 F (36.6 C) (Temporal)   Ht 5' 9 (1.753 m)   Wt 187 lb 12.8 oz (85.2 kg)   SpO2 98%   BMI 27.73 kg/m  Gen: NAD, resting comfortably HEENT: Mucous membranes are moist. Oropharynx normal Neck: no thyromegaly CV: RRR no murmurs rubs or gallops Lungs: CTAB no crackles, wheeze, rhonchi Abdomen: soft/nontender/nondistended/normal bowel sounds. No rebound or guarding.  Ext: no edema Skin: warm, dry Neuro: grossly normal, moves all extremities, PERRLA  Diabetic foot exam was performed with the following findings:   No deformities, ulcerations, or other skin breakdown Normal sensation of 10g monofilament Intact posterior tibialis and dorsalis pedis pulses Sensation slightly less on right than left per baseline      Assessment and Plan  73 y.o. male presenting for annual physical.  Health Maintenance counseling: 1. Anticipatory guidance: Patient counseled regarding regular dental exams -q3 months with gum disease, eye exams -yearly,  avoiding smoking and second hand smoke , limiting alcohol  to 2 beverages per day - none, no illicit drugs .   2. Risk factor reduction:  Advised patient of need for regular exercise and diet rich and fruits and vegetables to reduce risk of heart attack and stroke.   Exercise- very active - getting out and hunting lately- encouraged continuing this in non hunting season.  Diet/weight management-weight up 8 lbs from last physical- encouraged stability to mild weight loss.  Wt Readings from Last 3 Encounters:  08/21/24 187 lb 12.8 oz (  85.2 kg)  07/31/24 188 lb 8 oz (85.5 kg)  06/13/24 185 lb (83.9 kg)  3. Immunizations/screenings/ancillary studies- prior severe reaction COVID shot, Prevnar 20 options- holding off, with reactivity on COVID holding off on shingrix Immunization History  Administered Date(s) Administered   Fluad Quad(high Dose 65+) 06/28/2019, 08/07/2020, 08/19/2021, 08/11/2022   INFLUENZA, HIGH DOSE SEASONAL PF 08/21/2024   Influenza,inj,Quad PF,6+ Mos 08/12/2014, 06/26/2015   Influenza-Unspecified 08/26/2016   Moderna Sars-Covid-2 Vaccination 11/07/2019, 12/05/2019   Pneumococcal Conjugate-13 08/24/2015   Pneumococcal Polysaccharide-23 12/03/2016   Tdap 08/28/2009   Zoster, Live 06/07/2013   4. Prostate cancer screening- follow up with urology   5. Colon cancer screening - 01/25/20  with 5 year repeat- will be due next year 6. Skin cancer screening- dermatology yearly. advised regular sunscreen use. Denies worrisome, changing, or new skin lesions.  7. Smoking associated screening (lung cancer screening, AAA screen 65-75, UA)- never smoker 8. 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 Tobie RAMAN: Medication: CellCept  1000 mg in am and 1500mg  in pm - prior prednisone  wean per Dr. Tobie- off in July 2024 -prior  IVIG  A/P: CK was up 4 days ago but had been doing a lot of  physical activity such as setting up multiple tree stands- had been low just 3 weeks ago before this- appears stable- continue current medications     # Optic neuropathy bilateral eyes- reassuring eye exams lately  # Diabetes- started out as steroid induced hyperglycemia on long term prednisone  # Microalbuminuria S: Medication:jardiance  25 mg, metformin  500 mg XR twice daily - diarrhea on metformin  1000 mg IR twice daily  Lab Results  Component Value Date   HGBA1C 6.3 (A) 08/21/2024   HGBA1C 6.8 (H) 04/06/2024   HGBA1C 7.7 (H) 12/09/2023   A/P: Diabetes-doing very well- continue current medications   microalbuminuria-not originally detected due to lab error-blood pressure will not tolerate valsartan but able to tolerate Jardiance  25 mg   # Atrial fibrillation-follows with Dr. Ladona- recurrence with surgery but went back into NSR  S: Rate controlled with no medication  Anticoagulated with Eliquis  5 mg twice daily  A/P:  appropriately anticoagulated and rate controlled- continue current medicine     # CAD-follows with Dr. Ganji  #hyperlipidemia S: Medication:Eliquis  5 mg twice daily (as well as Plavix  75 mg for 30 days before back to aspirin  81 mg), Vascepa  2g twice daily- had to hold around surgery , Repatha  140 mg every 2 weeks - History of mild LFT elevations   at times - still mild -symptoms:  no chest pain or shortness of breath or abnormal fatigue (his typical presenting symptom)  A/P: CAD asymptomatic continue current medications  Hyperlipidemia -may have been hair high off of vascepa  around surgery but at goal previously- continue current medications    # GERD S:Medication:  pantoprazole  20 mg daily A/P: reasonable control- continue current medications  Lab Results  Component Value Date   TSH 4.150 06/01/2023   #B12 deficiency on mwf- was good in June- check every 6-12 months- suspect at goal Lab Results  Component Value Date   VITAMINB12 1,103 04/06/2024    #  sees  urology in march yearly- does not need PSA with us - sees them uyearly  -on finasteride  5 mg daily for bph  as well as tamsulosin   # Right sided hemiparesis-history of falling a tree stand and required cervical and low back surgery-decree strength on the right and contracture of the hand- no change   Recommended follow up: Return in about 6 months (around 02/18/2025) for followup or sooner if needed.Schedule b4 you leave. Future Appointments  Date Time Provider Department Center  09/03/2024 11:20 AM [facility] ANNUAL WELLNESS VISIT 1 [facility]  10/24/2024 10:30 AM Tobie Breslow K, DO [facility] None  01/22/2025  8:30 AM [facility]  01/22/2025  9:30 AM [facility]  01/22/2025  9:40 AM Gretta Lonni PARAS, MD [facility] H[facility]V    Lab/Order associations:   ICD-10-CM   1. Preventative health care  Z00.00     2. Immunization due  Z23 Flu vaccine HIGH DOSE PF(Fluzone Trivalent)    3. Paroxysmal atrial fibrillation (HCC)  I48.0     4. 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)  E11.8 POCT glycosylated hemoglobin (Hb A1C)    Microalbumin / creatinine urine ratio    5. Coronary artery disease involving native coronary artery of native heart with other form of angina pectoris  I25.118     6. Hyperlipidemia, unspecified hyperlipidemia type  E78.5     7. Transaminitis  R74.01       No orders of the defined types were placed in this encounter.   Return precautions advised.  Garnette Lukes, MD

## 2024-08-21 NOTE — Patient Instructions (Addendum)
 Urine before you leave   Congrats on great a1c!   Thrilled legs are better and you have healed up well  Recommended follow up: Return in about 6 months (around 02/18/2025) for followup or sooner if needed.Schedule b4 you leave.

## 2024-09-08 ENCOUNTER — Other Ambulatory Visit (HOSPITAL_COMMUNITY): Payer: Self-pay

## 2024-09-12 ENCOUNTER — Other Ambulatory Visit: Payer: Self-pay

## 2024-10-06 ENCOUNTER — Other Ambulatory Visit (HOSPITAL_COMMUNITY): Payer: Self-pay

## 2024-10-07 ENCOUNTER — Other Ambulatory Visit: Payer: Self-pay | Admitting: Cardiology

## 2024-10-07 DIAGNOSIS — I480 Paroxysmal atrial fibrillation: Secondary | ICD-10-CM

## 2024-10-08 ENCOUNTER — Other Ambulatory Visit: Payer: Self-pay

## 2024-10-08 MED ORDER — APIXABAN 5 MG PO TABS
5.0000 mg | ORAL_TABLET | Freq: Two times a day (BID) | ORAL | 1 refills | Status: AC
  Filled 2024-10-08: qty 180, 90d supply, fill #0

## 2024-10-08 NOTE — Telephone Encounter (Signed)
 Prescription refill request for Eliquis  received. Indication:afib Last office visit:5/25 Scr: 0.87  11/25 Age:73 Weight:85.2  kg  Prescription refilled

## 2024-10-11 ENCOUNTER — Encounter: Payer: Self-pay | Admitting: Neurology

## 2024-10-11 DIAGNOSIS — G7249 Other inflammatory and immune myopathies, not elsewhere classified: Secondary | ICD-10-CM

## 2024-10-11 DIAGNOSIS — Z5181 Encounter for therapeutic drug level monitoring: Secondary | ICD-10-CM

## 2024-10-12 ENCOUNTER — Other Ambulatory Visit: Payer: Self-pay

## 2024-10-12 DIAGNOSIS — G7249 Other inflammatory and immune myopathies, not elsewhere classified: Secondary | ICD-10-CM

## 2024-10-12 DIAGNOSIS — Z5181 Encounter for therapeutic drug level monitoring: Secondary | ICD-10-CM

## 2024-10-20 LAB — CK: Total CK: 1183 U/L (ref 41–331)

## 2024-10-22 ENCOUNTER — Ambulatory Visit: Payer: Self-pay | Admitting: Neurology

## 2024-10-24 ENCOUNTER — Ambulatory Visit: Admitting: Neurology

## 2024-10-24 ENCOUNTER — Other Ambulatory Visit (HOSPITAL_COMMUNITY): Payer: Self-pay

## 2024-10-24 ENCOUNTER — Encounter: Payer: Self-pay | Admitting: Neurology

## 2024-10-24 VITALS — BP 128/68 | HR 53 | Ht 69.0 in | Wt 191.0 lb

## 2024-10-24 DIAGNOSIS — G7249 Other inflammatory and immune myopathies, not elsewhere classified: Secondary | ICD-10-CM | POA: Diagnosis not present

## 2024-10-24 MED ORDER — MYCOPHENOLATE MOFETIL 500 MG PO TABS
ORAL_TABLET | ORAL | 3 refills | Status: AC
  Filled 2024-10-24 – 2024-11-15 (×3): qty 450, 90d supply, fill #0

## 2024-10-24 NOTE — Progress Notes (Signed)
 "   Follow-up Visit   Date: 10/24/2024   Stanley Boyd MRN: [id number] DOB: 1951-04-21    Stanley Boyd is a 74 y.o. 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  The patient was accompanied to the clinic by self.   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  No evidence of weakness.  CKs are elevated, but stable and acceptable < 1500 especially that he has no weakness.   Previously managed on a long course of IVIG (discontinued in 01/2021), prednisone  (stopped in August 2024), and Cellcept  (increased to 1000mg  BID in Feb 2022 and again to 2500mg /d in Dec 2024)  Continue Cellcept  1000mg  and 1500mg  at bedtime Check CK in 6 months  Return to clinic in 6 months  ------------------------------------------------------------------------ Interim History: He is here for follow-up visit.  He has been doing great and tells me that he has felt the best in a long time over the past 4 months.  He has been able to reduce prednisone  to 2.5mg , which he has been on for the past month.  No new weakness.  CK continues to fluctuate ~ 600-700.   UPDATE 10/11/2023:  He is here for follow-up visit.  He has been off prednisone  since ~ August after a very long taper.  His most recent CK was elevated from 700 > 900.  He denies any weakness.  If anything, he has noticed that it is harder for him to get up off the floor, but feels this may more age-related than true weakness.  He is physically very active and has not noticed any new changes with his endurance or activity level.  No interval falls or illnesses.   UPDATE 12/28/2023:  He is here for follow-up visit.  CK has been rising over the past few months 704 > 915 >1298.  He has been feeling well and  denies any new weakness.  He is still able to continue the same level of physical activity. No new cramps.  UPDATE 05/01/2024:  He is here for follow-up visit. He has been having increased low back pain [place]  He developed low back pain radiating into the left leg with associated numbness.  MRI lumbar spine shows moderate to severe impingement at L4-5 with disc fragment extended into the left lateral recess from L4-5 impinging on left L5 nerve root. He has some left leg weakness.  He was given prednisone  course for pain and will be having ESI later this week.  No arm or right leg weakness.  His CK remains ~1100.    UPDATE 10/24/2024:  He is here for follow-up visit and doing well.  He denies any new weakness. CK remains elevated, but stable ~1100.  He was hospitalized in September and had infrarenal aortic stenting and bilateral iliac stent.  He reports that leg strength and fatigue is much better.  He remains compliant with Cellcept  1000mg  in the morning and 1500mg  at bedtime.   Medications:  Current Outpatient Medications on File Prior to Visit  Medication Sig Dispense Refill   apixaban  (ELIQUIS ) 5 MG TABS tablet Take 1 tablet (5 mg total) by mouth 2 (two) times daily. 180 tablet 1   Carboxymethylcellulose Sodium (REFRESH TEARS OP) Place 1 drop into both eyes daily as needed (dry eyes).     empagliflozin  (JARDIANCE )  25 MG TABS tablet Take 1 tablet (25 mg total) by mouth daily before breakfast. 90 tablet 3   Evolocumab  (REPATHA  SURECLICK) 140 MG/ML SOAJ Inject 140 mg into the skin every 14 (fourteen) days. 6 mL 3   finasteride  (PROSCAR ) 5 MG tablet Take 1 tablet (5 mg total) by mouth daily. 90 tablet 3   gabapentin  (NEURONTIN ) 300 MG capsule Take 1 capsule (300 mg total) by mouth 3 (three) times daily as needed. 90 capsule 3   metFORMIN  (GLUCOPHAGE -XR) 500 MG 24 hr tablet Take 1 tablet (500 mg total) by mouth 2 (two) times daily with a meal. 180 tablet 3   mycophenolate   (CELLCEPT ) 500 MG tablet Take 2 tablets (1,000 mg total) by mouth in the morning AND 3 tablets (1,500 mg total) every evening. 450 tablet 3   pantoprazole  (PROTONIX ) 20 MG tablet Take 1 tablet (20 mg total) by mouth daily. 90 tablet 3   sildenafil  (VIAGRA ) 100 MG tablet Take 1 tablet (100 mg total) by mouth daily as needed for erectile dysfunction. 10 tablet 11   tamsulosin  (FLOMAX ) 0.4 MG CAPS capsule Take 1 capsule (0.4 mg total) by mouth daily. 90 capsule 3   tiZANidine  (ZANAFLEX ) 4 MG tablet Take 4 mg by mouth every 6 (six) hours as needed for muscle spasms.     VASCEPA  1 g capsule Take 2 capsules (2 g total) by mouth 2 (two) times daily. 120 capsule 9   No current facility-administered medications on file prior to visit.    Allergies:  Allergies  Allergen Reactions   Covid-19 Mrna Vacc (Moderna) Other (See Comments)    Optic neuropathy after original 2 vaccines    Statins Other (See Comments)    Myositis    Vital Signs:  BP 128/68   Pulse (!) 53   Ht 5' 9 (1.753 m)   Wt 191 lb (86.6 kg)   SpO2 100%   BMI 28.21 kg/m   Neurological Exam: MENTAL STATUS including orientation to time, place, person, recent and remote memory, attention span and concentration, language, and fund of knowledge is normal.  Speech is not dysarthric.  CRANIAL NERVES: Normal conjugate, extra-ocular eye movements in all directions of gaze.  No ptosis bilaterally.  Face is symmetric.   MOTOR:  Motor strength is 5/5 throughout, except R hand is 63/5 (old from stroke). No atrophy, fasciculations or abnormal movements.  No pronator drift.   COORDINATION/GAIT:   Gait is shows mild limp, stable.  Data: MRI Lumbar spine wo contrast 04/17/2024: 1. Lumbar spondylosis and degenerative disc disease, causing prominent impingement at L5-S1; moderate to prominent impingement at L4-5; and moderate impingement at L3-4. Special note is made of ectopic disc material (disc extrusion versus disc fragment) extending  caudad in the left lateral recess from L4-5, impinging on the left L5 nerve roots.  MRI cervical spine without contrast 02/17/2019: 1. 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 2. Solid anterior fusion at C4-5 and C5-6 without spinal stenosis at these levels. 3. Adjacent segment degenerative changes with moderate spinal stenosis at C6-7, mild-to-moderate spinal stenosis at C3-4, and mild-to-moderate neural foraminal stenosis at both levels.   MRI lumbar spine 02/17/2019: 1. Relatively diffuse lumbar disc and facet degeneration without significant spinal stenosis. 2. Multilevel neural foraminal stenosis, moderate on the left at L5-S1. 3. Normal appearance of the conus medullaris and cauda equina.   MRI thoracic spine without contrast 02/17/2019: 1. Mild thoracic disc and facet degeneration without evidence of neural impingement. 2. Minimal prominence of the central canal  of the mid to lower thoracic spinal cord without syrinx.  Left rectus femorus muscle biopsy 02/20/2019:  Active necrotizing inflammatory myopathy. Right chest wall skin biopsy:  Benign skin with fibrosis and mixed superficial and deep inflammation  Neuromuscular Lab - [facility] 03/20/2019:  Myopathy 2 panel - HMGCR 36,000 (normal <2500)  Component     Latest Ref Rng 07/31/2024 08/17/2024 10/19/2024  CK Total     41 - 331 U/L 997 (H)  1,277 (HH)  1,183 (HH)     Legend: (H) High (HH) High Panic  Lab Results  Component Value Date   CKTOTAL 1,183 (HH) 10/19/2024   CKMB CANCELED 03/21/2020   CKMBINDEX 23.6 (HH) 04/15/2021   TROPONINI 0.03 (HH) 02/17/2019    Lab Results  Component Value Date   HGBA1C 6.3 (A) 08/21/2024     Thank you for allowing me to participate in patient's care.  If I can answer any additional questions, I would be pleased to do so.    Sincerely,    Geremiah Fussell K. Tobie, DO "

## 2024-10-25 ENCOUNTER — Other Ambulatory Visit: Payer: Self-pay

## 2024-10-26 ENCOUNTER — Other Ambulatory Visit: Payer: Self-pay

## 2024-11-05 ENCOUNTER — Encounter: Payer: Self-pay | Admitting: Pharmacy Technician

## 2024-11-05 ENCOUNTER — Other Ambulatory Visit (HOSPITAL_COMMUNITY): Payer: Self-pay

## 2024-11-05 ENCOUNTER — Other Ambulatory Visit: Payer: Self-pay

## 2024-11-06 ENCOUNTER — Other Ambulatory Visit: Payer: Self-pay

## 2024-11-07 ENCOUNTER — Other Ambulatory Visit: Payer: Self-pay

## 2024-11-15 ENCOUNTER — Other Ambulatory Visit (HOSPITAL_COMMUNITY): Payer: Self-pay

## 2024-11-15 ENCOUNTER — Other Ambulatory Visit: Payer: Self-pay

## 2025-01-22 ENCOUNTER — Ambulatory Visit (HOSPITAL_COMMUNITY)

## 2025-01-22 ENCOUNTER — Ambulatory Visit: Admitting: Vascular Surgery

## 2025-02-22 ENCOUNTER — Ambulatory Visit: Admitting: Family Medicine

## 2025-02-25 ENCOUNTER — Ambulatory Visit: Admitting: Cardiology

## 2025-04-29 ENCOUNTER — Ambulatory Visit: Payer: Self-pay | Admitting: Neurology
